# Patient Record
Sex: Male | Born: 1950 | Race: White | Hispanic: No | Smoking: Never smoker
Health system: Southern US, Community
[De-identification: ages and names within clinical notes are randomized; demographics above are authoritative.]

## PROBLEM LIST (undated history)

## (undated) DIAGNOSIS — M1990 Unspecified osteoarthritis, unspecified site: Secondary | ICD-10-CM

## (undated) DIAGNOSIS — R112 Nausea with vomiting, unspecified: Secondary | ICD-10-CM

## (undated) DIAGNOSIS — R198 Other specified symptoms and signs involving the digestive system and abdomen: Secondary | ICD-10-CM

## (undated) DIAGNOSIS — Z9889 Other specified postprocedural states: Secondary | ICD-10-CM

## (undated) DIAGNOSIS — R0600 Dyspnea, unspecified: Secondary | ICD-10-CM

## (undated) DIAGNOSIS — T8859XA Other complications of anesthesia, initial encounter: Secondary | ICD-10-CM

## (undated) DIAGNOSIS — D649 Anemia, unspecified: Secondary | ICD-10-CM

## (undated) DIAGNOSIS — C801 Malignant (primary) neoplasm, unspecified: Secondary | ICD-10-CM

## (undated) DIAGNOSIS — T4145XA Adverse effect of unspecified anesthetic, initial encounter: Secondary | ICD-10-CM

## (undated) HISTORY — PX: VASECTOMY: SHX75

## (undated) HISTORY — PX: DIAGNOSTIC LAPAROSCOPY: SUR761

## (undated) HISTORY — DX: Other specified symptoms and signs involving the digestive system and abdomen: R19.8

---

## 2015-11-27 DIAGNOSIS — Z2821 Immunization not carried out because of patient refusal: Secondary | ICD-10-CM | POA: Insufficient documentation

## 2015-12-14 DIAGNOSIS — G8929 Other chronic pain: Secondary | ICD-10-CM | POA: Insufficient documentation

## 2015-12-14 DIAGNOSIS — M25562 Pain in left knee: Secondary | ICD-10-CM

## 2017-06-24 ENCOUNTER — Ambulatory Visit: Payer: Self-pay | Admitting: Surgery

## 2017-06-25 ENCOUNTER — Encounter
Admission: RE | Admit: 2017-06-25 | Discharge: 2017-06-25 | Disposition: A | Discharge status: Home / Self Care | Payer: Medicare Other | Source: Ambulatory Visit | Attending: Surgery | Admitting: Surgery

## 2017-06-25 DIAGNOSIS — Z791 Long term (current) use of non-steroidal anti-inflammatories (NSAID): Secondary | ICD-10-CM | POA: Diagnosis not present

## 2017-06-25 DIAGNOSIS — M1990 Unspecified osteoarthritis, unspecified site: Secondary | ICD-10-CM | POA: Diagnosis not present

## 2017-06-25 DIAGNOSIS — C773 Secondary and unspecified malignant neoplasm of axilla and upper limb lymph nodes: Secondary | ICD-10-CM | POA: Diagnosis not present

## 2017-06-25 DIAGNOSIS — Z79899 Other long term (current) drug therapy: Secondary | ICD-10-CM | POA: Diagnosis not present

## 2017-06-25 DIAGNOSIS — R229 Localized swelling, mass and lump, unspecified: Secondary | ICD-10-CM | POA: Diagnosis present

## 2017-06-25 DIAGNOSIS — Z8249 Family history of ischemic heart disease and other diseases of the circulatory system: Secondary | ICD-10-CM | POA: Diagnosis not present

## 2017-06-25 DIAGNOSIS — Z833 Family history of diabetes mellitus: Secondary | ICD-10-CM | POA: Diagnosis not present

## 2017-06-25 DIAGNOSIS — Z9852 Vasectomy status: Secondary | ICD-10-CM | POA: Diagnosis not present

## 2017-06-25 HISTORY — DX: Unspecified osteoarthritis, unspecified site: M19.90

## 2017-06-25 HISTORY — DX: Anemia, unspecified: D64.9

## 2017-06-25 MED ORDER — CEFAZOLIN SODIUM-DEXTROSE 2-4 GM/100ML-% IV SOLN
2.0000 g | INTRAVENOUS | Status: AC
  Administered 2017-06-26: 2 g via INTRAVENOUS

## 2017-06-25 NOTE — Pre-Procedure Instructions (Signed)
Component Name Value Ref Range  EKG Systolic BP  mmHg  EKG Diastolic BP  mmHg  EKG Ventricular Rate 56 BPM  EKG Atrial Rate 56 BPM  EKG P-R Interval 154 ms  EKG QRS Duration 90 ms  EKG Q-T Interval 426 ms  EKG QTC Calculation 411 ms  EKG Calculated P Axis 42 degrees  EKG Calculated R Axis  degrees  EKG Calculated T Axis 23 degrees  Result Narrative  SINUS BRADYCARDIA NO PREVIOUS ECGS AVAILABLE Confirmed by Dimas Alexandria [phone]) on 04/27/2017 4:55:42 PM  Other Result Information  Interface, Rad Results In - 04/27/2017  4:55 PM EDT SINUS BRADYCARDIA NO PREVIOUS ECGS AVAILABLE Confirmed by Dimas Alexandria [phone]) on 04/27/2017 4:55:42 PM

## 2017-06-25 NOTE — Patient Instructions (Signed)
Your procedure is scheduled on: June 26, 2017 (FRIDAY ) Report to Same Day Surgery 2nd floor medical mall ([facility] Entrance-take elevator on left to 2nd floor.  Check in with surgery information desk.) To find out your arrival time please call [phone] between 1PM - 3PM on June 25, 2017 (THURSDAY )  Remember: Instructions that are not followed completely may result in serious medical risk, up to and including death, or upon the discretion of your surgeon and anesthesiologist your surgery may need to be rescheduled.    _x___ 1. Do not eat food after midnight the night before your procedure. You may drink clear liquids up to 2 hours before you are scheduled to arrive at the hospital for your procedure.  Do not drink clear liquids within 2 hours of your scheduled arrival to the hospital.  Clear liquids include  --Water or Apple juice without pulp  --Clear carbohydrate beverage such as ClearFast or Gatorade  --Black Coffee or Clear Tea (No milk, no creamers, do not add anything to                  the coffee or Tea Type 1 and type 2 diabetics should only drink water.  No gum chewing or hard candies.     __x__ 2. No Alcohol for 24 hours before or after surgery.   __x__3. No Smoking for 24 prior to surgery.   ____  4. Bring all medications with you on the day of surgery if instructed.    __x__ 5. Notify your doctor if there is any change in your medical condition     (cold, fever, infections).     Do not wear jewelry, make-up, hairpins, clips or nail polish.  Do not wear lotions, powders, or perfumes.  Do not shave 48 hours prior to surgery. Men may shave face and neck.  Do not bring valuables to the hospital.    [facility] is not responsible for any belongings or valuables.               Contacts, dentures or bridgework may not be worn into surgery.  Leave your suitcase in the car. After surgery it may be brought to your room.  For patients admitted to the hospital,  discharge time is determined by your treatment team                   Patients discharged the day of surgery will not be allowed to drive home.  You will need someone to drive you home and stay with you the night of your procedure.    Please read over the following fact sheets that you were given:   [facility] Preparing for Surgery and or MRSA Information   ____ Take anti-hypertensive listed below, cardiac, seizure, asthma,     anti-reflux and psychiatric medicines. These include:  1.   2.  3.  4.  5.  6.  ____Fleets enema or Magnesium Citrate as directed.   _x___ Use CHG Soap or sage wipes as directed on instruction sheet   ____ Use inhalers on the day of surgery and bring to hospital day of surgery  ____ Stop Metformin and Janumet 2 days prior to surgery.    ____ Take 1/2 of usual insulin dose the night before surgery and none on the morning surgery.     _x___ Follow recommendations from Cardiologist, Pulmonologist or PCP regarding          stopping Aspirin, Coumadin, Plavix ,  Eliquis, Effient, or Pradaxa, and Pletal.  X____Stop Anti-inflammatories such as Advil, Aleve, Ibuprofen, Motrin, Naproxen, Naprosyn, Goodies powders or aspirin products. OK to take Tylenol  _x___ Stop supplements until after surgery.  But may continue Vitamin D, Vitamin B,and multivitamin          ____ Bring C-Pap to the hospital.

## 2017-06-26 ENCOUNTER — Ambulatory Visit: Payer: Medicare Other | Admitting: Anesthesiology

## 2017-06-26 ENCOUNTER — Ambulatory Visit
Admission: RE | Admit: 2017-06-26 | Discharge: 2017-06-26 | Disposition: A | Discharge status: Home / Self Care | Payer: Medicare Other | Source: Ambulatory Visit | Attending: Surgery | Admitting: Surgery

## 2017-06-26 ENCOUNTER — Encounter
Admission: RE | Disposition: A | Discharge status: Home / Self Care | Payer: Self-pay | Source: Ambulatory Visit | Attending: Surgery

## 2017-06-26 DIAGNOSIS — Z833 Family history of diabetes mellitus: Secondary | ICD-10-CM | POA: Insufficient documentation

## 2017-06-26 DIAGNOSIS — Z8249 Family history of ischemic heart disease and other diseases of the circulatory system: Secondary | ICD-10-CM | POA: Insufficient documentation

## 2017-06-26 DIAGNOSIS — Z9852 Vasectomy status: Secondary | ICD-10-CM | POA: Insufficient documentation

## 2017-06-26 DIAGNOSIS — C773 Secondary and unspecified malignant neoplasm of axilla and upper limb lymph nodes: Secondary | ICD-10-CM | POA: Insufficient documentation

## 2017-06-26 DIAGNOSIS — M1990 Unspecified osteoarthritis, unspecified site: Secondary | ICD-10-CM | POA: Insufficient documentation

## 2017-06-26 DIAGNOSIS — Z79899 Other long term (current) drug therapy: Secondary | ICD-10-CM | POA: Insufficient documentation

## 2017-06-26 DIAGNOSIS — Z791 Long term (current) use of non-steroidal anti-inflammatories (NSAID): Secondary | ICD-10-CM | POA: Insufficient documentation

## 2017-06-26 DIAGNOSIS — R2232 Localized swelling, mass and lump, left upper limb: Secondary | ICD-10-CM

## 2017-06-26 HISTORY — DX: Other specified postprocedural states: Z98.890

## 2017-06-26 HISTORY — DX: Adverse effect of unspecified anesthetic, initial encounter: T41.45XA

## 2017-06-26 HISTORY — PX: AXILLARY LYMPH NODE DISSECTION: SHX5229

## 2017-06-26 HISTORY — DX: Other complications of anesthesia, initial encounter: T88.59XA

## 2017-06-26 HISTORY — DX: Nausea with vomiting, unspecified: R11.2

## 2017-06-26 SURGERY — LYMPHADENECTOMY, AXILLARY
Anesthesia: General | Laterality: Left | Wound class: Clean

## 2017-06-26 MED ORDER — ACETAMINOPHEN 650 MG RE SUPP
650.0000 mg | RECTAL | Status: DC | PRN
  Filled 2017-06-26: qty 1

## 2017-06-26 MED ORDER — DEXTROSE IN LACTATED RINGERS 5 % IV SOLN: INTRAVENOUS | Status: DC

## 2017-06-26 MED ORDER — PROPOFOL 10 MG/ML IV BOLUS
INTRAVENOUS | Status: AC
  Filled 2017-06-26: qty 20

## 2017-06-26 MED ORDER — GLYCOPYRROLATE 0.2 MG/ML IJ SOLN
INTRAMUSCULAR | Status: DC | PRN
  Administered 2017-06-26: 0.2 mg via INTRAVENOUS

## 2017-06-26 MED ORDER — FAMOTIDINE 20 MG PO TABS
20.0000 mg | ORAL_TABLET | Freq: Once | ORAL | Status: AC
  Administered 2017-06-26: 20 mg via ORAL

## 2017-06-26 MED ORDER — FENTANYL CITRATE (PF) 100 MCG/2ML IJ SOLN: 25.0000 ug | INTRAMUSCULAR | Status: DC | PRN

## 2017-06-26 MED ORDER — SODIUM CHLORIDE 0.9 % IV SOLN: 250.0000 mL | INTRAVENOUS | Status: DC | PRN

## 2017-06-26 MED ORDER — MIDAZOLAM HCL 2 MG/2ML IJ SOLN
INTRAMUSCULAR | Status: DC | PRN
Start: 2017-06-26 — End: 2017-06-26
  Administered 2017-06-26: 2 mg via INTRAVENOUS

## 2017-06-26 MED ORDER — CELECOXIB 200 MG PO CAPS
ORAL_CAPSULE | ORAL | Status: AC
  Filled 2017-06-26: qty 2

## 2017-06-26 MED ORDER — EPHEDRINE SULFATE 50 MG/ML IJ SOLN
INTRAMUSCULAR | Status: AC
  Filled 2017-06-26: qty 1

## 2017-06-26 MED ORDER — CELECOXIB 200 MG PO CAPS
400.0000 mg | ORAL_CAPSULE | ORAL | Status: AC
  Administered 2017-06-26: 400 mg via ORAL

## 2017-06-26 MED ORDER — LACTATED RINGERS IV SOLN
INTRAVENOUS | Status: DC
  Administered 2017-06-26 (×2): via INTRAVENOUS

## 2017-06-26 MED ORDER — HEPARIN SODIUM (PORCINE) 5000 UNIT/ML IJ SOLN
5000.0000 [IU] | Freq: Once | INTRAMUSCULAR | Status: AC
  Administered 2017-06-26: 5000 [IU] via SUBCUTANEOUS

## 2017-06-26 MED ORDER — ONDANSETRON HCL 4 MG/2ML IJ SOLN
INTRAMUSCULAR | Status: AC
  Filled 2017-06-26: qty 2

## 2017-06-26 MED ORDER — PROPOFOL 10 MG/ML IV BOLUS
INTRAVENOUS | Status: DC | PRN
  Administered 2017-06-26: 150 mg via INTRAVENOUS

## 2017-06-26 MED ORDER — GABAPENTIN 300 MG PO CAPS
ORAL_CAPSULE | ORAL | Status: AC
  Filled 2017-06-26: qty 1

## 2017-06-26 MED ORDER — FAMOTIDINE 20 MG PO TABS
ORAL_TABLET | ORAL | Status: AC
  Filled 2017-06-26: qty 1

## 2017-06-26 MED ORDER — CHLORHEXIDINE GLUCONATE CLOTH 2 % EX PADS
6.0000 | MEDICATED_PAD | Freq: Once | CUTANEOUS | Status: AC
  Administered 2017-06-26: 6 via TOPICAL

## 2017-06-26 MED ORDER — MORPHINE SULFATE (PF) 4 MG/ML IV SOLN: 1.0000 mg | INTRAVENOUS | Status: DC | PRN

## 2017-06-26 MED ORDER — LIDOCAINE HCL (PF) 2 % IJ SOLN
INTRAMUSCULAR | Status: AC
  Filled 2017-06-26: qty 4

## 2017-06-26 MED ORDER — SODIUM CHLORIDE 0.9% FLUSH
3.0000 mL | INTRAVENOUS | Status: DC | PRN
Start: 2017-06-26 — End: 2017-06-26

## 2017-06-26 MED ORDER — DEXAMETHASONE SODIUM PHOSPHATE 10 MG/ML IJ SOLN
INTRAMUSCULAR | Status: AC
  Filled 2017-06-26: qty 1

## 2017-06-26 MED ORDER — FENTANYL CITRATE (PF) 100 MCG/2ML IJ SOLN
INTRAMUSCULAR | Status: DC | PRN
  Administered 2017-06-26 (×2): 50 ug via INTRAVENOUS

## 2017-06-26 MED ORDER — LIDOCAINE HCL (CARDIAC) 20 MG/ML IV SOLN
INTRAVENOUS | Status: DC | PRN
  Administered 2017-06-26: 50 mg via INTRAVENOUS

## 2017-06-26 MED ORDER — FENTANYL CITRATE (PF) 100 MCG/2ML IJ SOLN
INTRAMUSCULAR | Status: AC
  Filled 2017-06-26: qty 2

## 2017-06-26 MED ORDER — ONDANSETRON 4 MG PO TBDP
4.0000 mg | ORAL_TABLET | Freq: Once | ORAL | Status: AC
  Administered 2017-06-26: 4 mg via ORAL
  Filled 2017-06-26: qty 1

## 2017-06-26 MED ORDER — MIDAZOLAM HCL 2 MG/2ML IJ SOLN
INTRAMUSCULAR | Status: AC
  Filled 2017-06-26: qty 2

## 2017-06-26 MED ORDER — BUPIVACAINE-EPINEPHRINE (PF) 0.5% -1:200000 IJ SOLN
INTRAMUSCULAR | Status: AC
  Filled 2017-06-26: qty 30

## 2017-06-26 MED ORDER — EPHEDRINE SULFATE 50 MG/ML IJ SOLN
INTRAMUSCULAR | Status: DC | PRN
  Administered 2017-06-26: 10 mg via INTRAVENOUS

## 2017-06-26 MED ORDER — HEPARIN SODIUM (PORCINE) 5000 UNIT/ML IJ SOLN
INTRAMUSCULAR | Status: AC
  Filled 2017-06-26: qty 1

## 2017-06-26 MED ORDER — ONDANSETRON HCL 4 MG/2ML IJ SOLN: 4.0000 mg | Freq: Once | INTRAMUSCULAR | Status: DC | PRN

## 2017-06-26 MED ORDER — GLYCOPYRROLATE 0.2 MG/ML IJ SOLN
INTRAMUSCULAR | Status: AC
  Filled 2017-06-26: qty 1

## 2017-06-26 MED ORDER — CEFAZOLIN SODIUM-DEXTROSE 2-4 GM/100ML-% IV SOLN
INTRAVENOUS | Status: AC
  Filled 2017-06-26: qty 100

## 2017-06-26 MED ORDER — ACETAMINOPHEN 500 MG PO TABS
ORAL_TABLET | ORAL | Status: AC
  Filled 2017-06-26: qty 2

## 2017-06-26 MED ORDER — CHLORHEXIDINE GLUCONATE CLOTH 2 % EX PADS: 6.0000 | MEDICATED_PAD | Freq: Once | CUTANEOUS | Status: DC

## 2017-06-26 MED ORDER — OXYCODONE HCL 5 MG PO TABS: 5.0000 mg | ORAL_TABLET | ORAL | Status: DC | PRN

## 2017-06-26 MED ORDER — SODIUM CHLORIDE 0.9% FLUSH: 3.0000 mL | Freq: Two times a day (BID) | INTRAVENOUS | Status: DC

## 2017-06-26 MED ORDER — HYDROCODONE-ACETAMINOPHEN 5-325 MG PO TABS: 1.0000 | ORAL_TABLET | ORAL | 0 refills | Status: DC | PRN

## 2017-06-26 MED ORDER — DEXAMETHASONE SODIUM PHOSPHATE 10 MG/ML IJ SOLN
INTRAMUSCULAR | Status: DC | PRN
  Administered 2017-06-26: 10 mg via INTRAVENOUS

## 2017-06-26 MED ORDER — ACETAMINOPHEN 325 MG PO TABS: 650.0000 mg | ORAL_TABLET | ORAL | Status: DC | PRN

## 2017-06-26 MED ORDER — ONDANSETRON HCL 4 MG/2ML IJ SOLN
INTRAMUSCULAR | Status: DC | PRN
  Administered 2017-06-26: 4 mg via INTRAVENOUS

## 2017-06-26 MED ORDER — ACETAMINOPHEN 500 MG PO TABS
1000.0000 mg | ORAL_TABLET | ORAL | Status: AC
  Administered 2017-06-26: 1000 mg via ORAL

## 2017-06-26 MED ORDER — GABAPENTIN 300 MG PO CAPS
300.0000 mg | ORAL_CAPSULE | ORAL | Status: AC
  Administered 2017-06-26: 300 mg via ORAL

## 2017-06-26 SURGICAL SUPPLY — 17 items
APPLIER CLIP 9.375 SM OPEN (CLIP) ×3
CANISTER SUCT 1200ML W/VALVE (MISCELLANEOUS) ×3 IMPLANT
CLIP APPLIE 9.375 SM OPEN (CLIP) ×1 IMPLANT
DRAPE PED LAPAROTOMY (DRAPES) ×3 IMPLANT
ELECT REM PT RETURN 9FT ADLT (ELECTROSURGICAL) ×3
ELECTRODE REM PT RTRN 9FT ADLT (ELECTROSURGICAL) ×1 IMPLANT
EVICEL 2ML SEALANT HUMAN (Miscellaneous) ×3 IMPLANT
EVICEL AIRLESS SPRAY ACCES (MISCELLANEOUS) ×3 IMPLANT
GLOVE BIO SURGEON STRL SZ8 (GLOVE) ×3 IMPLANT
GOWN STRL REUS W/ TWL LRG LVL3 (GOWN DISPOSABLE) ×2 IMPLANT
GOWN STRL REUS W/TWL LRG LVL3 (GOWN DISPOSABLE) ×4
HARMONIC SCALPEL FOCUS (MISCELLANEOUS) ×3 IMPLANT
HEMOSTAT ARISTA ABSORB 3G PWDR (MISCELLANEOUS) ×3 IMPLANT
KIT RM TURNOVER STRD PROC AR (KITS) ×3 IMPLANT
NEEDLE HYPO 25GX1X1/2 BEV (NEEDLE) ×3 IMPLANT
SPONGE XRAY 4X4 16PLY STRL (MISCELLANEOUS) ×3 IMPLANT
SYR BULB IRRIG 60ML STRL (SYRINGE) ×3 IMPLANT

## 2017-06-26 NOTE — Anesthesia Post-op Follow-up Note (Signed)
Anesthesia QCDR form completed.        

## 2017-06-26 NOTE — Anesthesia Postprocedure Evaluation (Signed)
Anesthesia Post Note  Patient: Louis Davies  Procedure(s) Performed: Procedure(s) (LRB): EXCISION OF LEFT AXILLARY MASS (Left)  Patient location during evaluation: PACU Anesthesia Type: General Level of consciousness: awake and alert Pain management: pain level controlled Vital Signs Assessment: post-procedure vital signs reviewed and stable Respiratory status: spontaneous breathing, nonlabored ventilation, respiratory function stable and patient connected to nasal cannula oxygen Cardiovascular status: blood pressure returned to baseline and stable Postop Assessment: no signs of nausea or vomiting Anesthetic complications: no     Last Vitals:  Vitals:   06/26/17 1231 06/26/17 1304  BP: 134/82 133/82  Pulse: 71 68  Resp: 16 16  Temp: (!) 36 C   SpO2: 98% 96%    Last Pain:  Vitals:   06/26/17 1304  TempSrc:   PainSc: 0-No pain                 Cherryl Babin S

## 2017-06-26 NOTE — OR Nursing (Signed)
Pt had dressed was on his way to the car via wheelchair when he started dry heaving severely.  Volunteer Becky brought pt back to room 4.  Called Dr. Marcello Moores he ordered Zofran 4 mg disintegrating tablet, not to start an IV at this time.

## 2017-06-26 NOTE — Op Note (Signed)
Surgeon: Kaylyn Lim, MD, FACS  Asst:  none  Anes:  General by LMA  Preop Dx: Mass in left axilla-adenopathy suspect lymphoma Postop Dx: Large adenopathy-incisional biopsy of node with touch preps, frozen and lymphoma workup (adequate tissue for sample-per path)  Mitiotic figures likely lymphoma, spindle cell neoplasm or melanoma-cell markers pending  Procedure: Open left axillary biopsy Location Surgery: [facility] #4 Complications: None noted  EBL:   400 cc  Drains: none  Description of Procedure:  The patient was taken to OR 4 .  After anesthesia was administered and the patient was prepped a timeout was performed.  The left axilla was clipped and prepped with Hibiclens.  A transverse incison was made and carried down through the fatty tissue down to a large nodal mass.  Individual nodes were not discerned.  With the harmonic scalpel incision into the nodal mass and excision of capsule and the vascular stroma that was amorphic and very friable.  Bovie and Harmonic used on bleeding with subsequent application of EVICEL and Arista which seemed to leave the nodal bed dry.   Wound closed in layers with 4-0 vicryl subcutaneously and subcuticularly and with skin staples.    The patient tolerated the procedure well and was taken to the PACU in stable condition.     Matt B. Hassell Done, [place], [facility], [place]

## 2017-06-26 NOTE — Interval H&P Note (Signed)
History and Physical Interval Note:  06/26/2017 9:06 AM  Louis Davies  has presented today for surgery, with the diagnosis of LEFT AXILLARY MASS  The various methods of treatment have been discussed with the patient and family. After consideration of risks, benefits and other options for treatment, the patient has consented to  Procedure(s): EXCISION OF LEFT AXILLARY MASS (Left) as a surgical intervention .  The patient's history has been reviewed, patient examined, no change in status, stable for surgery.  I have reviewed the patient's chart and labs.  Questions were answered to the patient's satisfaction.     Terrie Haring B

## 2017-06-26 NOTE — Anesthesia Procedure Notes (Signed)
Procedure Name: LMA Insertion Performed by: Silvana Newness Pre-anesthesia Checklist: Patient identified, Patient being monitored, Timeout performed, Emergency Drugs available and Suction available Patient Re-evaluated:Patient Re-evaluated prior to induction Oxygen Delivery Method: Circle system utilized Preoxygenation: Pre-oxygenation with 100% oxygen Induction Type: IV induction Ventilation: Mask ventilation without difficulty LMA: LMA inserted LMA Size: 4.0 Tube type: Oral Number of attempts: 1 Placement Confirmation: positive ETCO2 and breath sounds checked- equal and bilateral Tube secured with: Tape Dental Injury: Teeth and Oropharynx as per pre-operative assessment

## 2017-06-26 NOTE — Transfer of Care (Signed)
Immediate Anesthesia Transfer of Care Note  Patient: Louis Davies  Procedure(s) Performed: Procedure(s): EXCISION OF LEFT AXILLARY MASS (Left)  Patient Location: PACU  Anesthesia Type:General  Level of Consciousness: drowsy and patient cooperative  Airway & Oxygen Therapy: Patient Spontanous Breathing and Patient connected to face mask oxygen  Post-op Assessment: Report given to RN and Post -op Vital signs reviewed and stable  Post vital signs: Reviewed and stable  Last Vitals:  Vitals:   06/26/17 0815  BP: 139/85  Pulse: (!) 58  Resp: 17  Temp: (!) 36.1 C  SpO2: 97%    Last Pain:  Vitals:   06/26/17 0815  TempSrc: Tympanic         Complications: No apparent anesthesia complications

## 2017-06-26 NOTE — OR Nursing (Signed)
At 1500 spoke with pt, reports he still feels nauseated.  Will let him sleep for another 30 minutes and re-evaluate.

## 2017-06-26 NOTE — OR Nursing (Signed)
At 71 35, pt is awake, reports need to void, transferred pt to wheelchair and transfered to bathroom.  Pt stool to void qs. Placed back into wheel chair to transferred to room 4.  Upon arrival to room 4 pt started dry heaving again.  Pt's friend talked with pt, he wants to go home, she is concerned about him drying on way home.  Spoke with Dr. Teryl Lucy regarding pt having dry heaves episodes, being given anti nausea meds, preop, in OR and post op.  Pt does have Zofran at home for nausea. If pt wants to go home he can, but Dr. Rosey Bath said pt is welcome to stay for further treatment, possible admission.  Pt wants to go home.  Taken to car in wheelchair.

## 2017-06-26 NOTE — Discharge Instructions (Signed)
AMBULATORY SURGERY  DISCHARGE INSTRUCTIONS   1) The drugs that you were given will stay in your system until tomorrow so for the next 24 hours you should not:  A) Drive an automobile B) Make any legal decisions C) Drink any alcoholic beverage   2) You may resume regular meals tomorrow.  Today it is better to start with liquids and gradually work up to solid foods.  You may eat anything you prefer, but it is better to start with liquids, then soup and crackers, and gradually work up to solid foods.   3) Please notify your doctor immediately if you have any unusual bleeding, trouble breathing, redness and pain at the surgery site, drainage, fever, or pain not relieved by medication.   4) Additional Instructions:Keep dressing clean and dry.

## 2017-06-26 NOTE — Anesthesia Preprocedure Evaluation (Signed)
Anesthesia Evaluation  Patient identified by MRN, date of birth, ID band Patient awake    Reviewed: Allergy & Precautions, NPO status , Patient's Chart, lab work & pertinent test results, reviewed documented beta blocker date and time   History of Anesthesia Complications (+) PONV  Airway Mallampati: II  TM Distance: >3 FB     Dental  (+) Chipped   Pulmonary           Cardiovascular      Neuro/Psych    GI/Hepatic   Endo/Other    Renal/GU      Musculoskeletal  (+) Arthritis ,   Abdominal   Peds  Hematology  (+) anemia ,   Anesthesia Other Findings   Reproductive/Obstetrics                             Anesthesia Physical Anesthesia Plan  ASA: II  Anesthesia Plan: General   Post-op Pain Management:    Induction: Intravenous  PONV Risk Score and Plan:   Airway Management Planned: LMA  Additional Equipment:   Intra-op Plan:   Post-operative Plan:   Informed Consent: I have reviewed the patients History and Physical, chart, labs and discussed the procedure including the risks, benefits and alternatives for the proposed anesthesia with the patient or authorized representative who has indicated his/her understanding and acceptance.     Plan Discussed with: CRNA  Anesthesia Plan Comments:         Anesthesia Quick Evaluation

## 2017-06-26 NOTE — H&P (Signed)
Chief Complaint:  Mass in left axilla  History of Present Illness:  Louis Davies is an 66 y.o. male who was seen last Thursday with a nontender mass in his left axilla.  He denies fever chills or night sweats.  Left axillary lymph node biopsy was discussed with him including indications and risks.  Past Medical History:  Diagnosis Date  . Anemia   . Arthritis   . Complication of anesthesia   . PONV (postoperative nausea and vomiting)     Past Surgical History:  Procedure Laterality Date  . VASECTOMY      Current Facility-Administered Medications  Medication Dose Route Frequency Provider Last Rate Last Dose  . acetaminophen (TYLENOL) 500 MG tablet           . ceFAZolin (ANCEF) 2-4 GM/100ML-% IVPB           . ceFAZolin (ANCEF) IVPB 2g/100 mL premix  2 g Intravenous On Call to OR Johnathan Hausen, MD      . celecoxib (CELEBREX) 200 MG capsule           . Chlorhexidine Gluconate Cloth 2 % PADS 6 each  6 each Topical Once Johnathan Hausen, MD      . famotidine (PEPCID) 20 MG tablet           . gabapentin (NEURONTIN) 300 MG capsule           . heparin 5000 UNIT/ML injection           . lactated ringers infusion   Intravenous Continuous Molli Barrows, MD 25 mL/hr at 06/26/17 [id number]     Patient has no known allergies. Family History  Problem Relation Age of Onset  . Diabetes Mother   . Hypertension Mother    Social History:   reports that he has never smoked. He has never used smokeless tobacco. He reports that he does not drink alcohol or use drugs.   REVIEW OF SYSTEMS : Negative except for see problem list  Physical Exam:   Blood pressure 139/85, pulse (!) 58, temperature (!) 97 F (36.1 C), temperature source Tympanic, resp. rate 17, height 5\' 11"  (1.803 m), weight 93 kg (205 lb), SpO2 97 %. Body mass index is 28.59 kg/m.  Gen:  WDWN WM NAD  Neurological: Alert and oriented to person, place, and time. Motor and sensory function is grossly intact  Head: Normocephalic and  atraumatic.  Eyes: Conjunctivae are normal. Pupils are equal, round, and reactive to light. No scleral icterus.  Neck: Normal range of motion. Neck supple. No tracheal deviation or thyromegaly present.  Cardiovascular:  SR without murmurs or gallops.  No carotid bruits Breast:  No palpable masses Respiratory: Effort normal.  No respiratory distress. No chest wall tenderness. Breath sounds normal.  No wheezes, rales or rhonchi.  Abdomen:  nontender GU:  No lymphadenopathy Musculoskeletal: Normal range of motion. Extremities are nontender. No cyanosis, edema or clubbing noted Lymphadenopathy: Large left axillary mass that is nontender Skin: Skin is warm and dry. No rash noted. No diaphoresis. No erythema. No pallor. Pscyh: Normal mood and affect. Behavior is normal. Judgment and thought content normal.   LABORATORY RESULTS: No results found for this or any previous visit (from the past 48 hour(s)).   RADIOLOGY RESULTS: No results found.  Problem List: There are no active problems to display for this patient.   Assessment & Plan: Left axillary lymphadenopathy for left axillary lymph node biopsy    Matt B. Hassell Done, MD, [facility]  [facility], [place]. [phone] beeper [phone]  06/26/2017 9:03 AM

## 2017-06-29 ENCOUNTER — Encounter: Payer: Self-pay | Admitting: Surgery

## 2017-06-30 ENCOUNTER — Other Ambulatory Visit: Payer: Self-pay | Admitting: Pathology

## 2017-07-06 ENCOUNTER — Telehealth: Payer: Self-pay

## 2017-07-06 NOTE — Telephone Encounter (Signed)
  Oncology Nurse Navigator Documentation Called and spoke with triage nurse at [facility] and they are going to refax referral and last physician note from Dr. Hassell Done. Provided them with appointment details for this week. Navigator Location: [facility] (07/06/17 1400)   )Navigator Encounter Type: Telephone (07/06/17 1400)                                                    Time Spent with Patient: 15 (07/06/17 1400)

## 2017-07-07 DIAGNOSIS — C799 Secondary malignant neoplasm of unspecified site: Secondary | ICD-10-CM | POA: Insufficient documentation

## 2017-07-07 DIAGNOSIS — C439 Malignant melanoma of skin, unspecified: Secondary | ICD-10-CM | POA: Insufficient documentation

## 2017-07-07 NOTE — Progress Notes (Signed)
[place]  [phone] [phone]  ID: MERTON WADLOW OB: 1951-05-03  MR#: [id number]  BEE#:[id number]  Patient Care Team: Care, [facility] as PCP - General (Family Medicine)  CHIEF COMPLAINT: Melanoma  INTERVAL HISTORY: Patient is a 66 year old male who noticed an enlarging nodule in his left axilla over the past several months. Subsequent excisional biopsy revealed metastatic melanoma. Patient had a lesion removed from his left back back in 2016 but otherwise has noted no other problems. Currently he is anxious, but otherwise feels well. He has no neurologic complaints. He denies any recent fevers or illnesses. He has a good appetite and denies weight loss. He has no chest pain or shortness of breath. He denies any nausea, vomiting, constipation, or diarrhea. He has no melena or hematochezia. He has no urinary complaints. Patient otherwise feels well and offers no further specific complaints.  REVIEW OF SYSTEMS:   Review of Systems  Constitutional: Negative.  Negative for fever, malaise/fatigue and weight loss.  Respiratory: Negative.  Negative for cough and shortness of breath.   Cardiovascular: Negative.  Negative for chest pain and leg swelling.  Gastrointestinal: Negative.  Negative for abdominal pain.  Genitourinary: Negative.   Musculoskeletal: Negative.   Skin: Negative.  Negative for rash.  Neurological: Negative.  Negative for sensory change and weakness.  Psychiatric/Behavioral: The patient is nervous/anxious.     As per HPI. Otherwise, a complete review of systems is negative.  PAST MEDICAL HISTORY: Past Medical History:  Diagnosis Date  . Anemia   . Arthritis   . Complication of anesthesia   . PONV (postoperative nausea and vomiting)     PAST SURGICAL HISTORY: Past Surgical History:  Procedure Laterality Date  . AXILLARY LYMPH NODE DISSECTION Left 06/26/2017   Procedure: EXCISION OF LEFT AXILLARY MASS;  Surgeon:  Johnathan Hausen, MD;  Location: [facility] ORS;  Service: General;  Laterality: Left;  Marland Kitchen VASECTOMY      FAMILY HISTORY: Family History  Problem Relation Age of Onset  . Diabetes Mother   . Hypertension Mother   . Leukemia Father   . Thyroid disease Sister   . Liver disease Maternal Grandmother   . Heart Problems Maternal Grandfather   . Diabetes Maternal Grandfather   . Lung cancer Paternal Grandmother   . Prostate cancer Paternal Grandfather     ADVANCED DIRECTIVES (Y/N):  N  HEALTH MAINTENANCE: Social History  Substance Use Topics  . Smoking status: Never Smoker  . Smokeless tobacco: Never Used  . Alcohol use No     Colonoscopy:  PAP:  Bone density:  Lipid panel:  No Known Allergies  Current Outpatient Prescriptions  Medication Sig Dispense Refill  . cephALEXin (KEFLEX) 500 MG capsule TK ONE C PO QID X 5 DAYS.  0  . Multiple Vitamin (MULTIVITAMIN) tablet Take 1 tablet by mouth daily.     No current facility-administered medications for this visit.     OBJECTIVE: Vitals:   07/09/17 1149  BP: (!) 161/78  Pulse: 62  Resp: 18  Temp: (!) 97.4 F (36.3 C)     Body mass index is 28.76 kg/m.    ECOG FS:0 - Asymptomatic  General: Well-developed, well-nourished, no acute distress. Eyes: Pink conjunctiva, anicteric sclera. HEENT: Normocephalic, moist mucous membranes, clear oropharnyx. Lungs: Clear to auscultation bilaterally. Heart: Regular rate and rhythm. No rubs, murmurs, or gallops. Abdomen: Soft, nontender, nondistended. No organomegaly noted, normoactive bowel sounds. Musculoskeletal: No edema, cyanosis, or clubbing. Neuro: Alert, answering all questions appropriately. Cranial  nerves grossly intact. Skin: No rashes or petechiae noted. Psych: Normal affect. Lymphatics: No cervical, calvicular, axillary or inguinal LAD.   LAB RESULTS:  Lab Results  Component Value Date   NA 138 07/09/2017   K 4.3 07/09/2017   CL 104 07/09/2017   CO2 27 07/09/2017    GLUCOSE 95 07/09/2017   BUN 16 07/09/2017   CREATININE 1.09 07/09/2017   CALCIUM 8.6 (L) 07/09/2017   PROT 7.1 07/09/2017   ALBUMIN 3.9 07/09/2017   AST 16 07/09/2017   ALT 12 (L) 07/09/2017   ALKPHOS 61 07/09/2017   BILITOT 0.6 07/09/2017   GFRNONAA >60 07/09/2017   GFRAA >60 07/09/2017    Lab Results  Component Value Date   WBC 6.6 07/09/2017   NEUTROABS 4.9 07/09/2017   HGB 11.8 (L) 07/09/2017   HCT 34.8 (L) 07/09/2017   MCV 80.5 07/09/2017   PLT 283 07/09/2017     STUDIES: No results found.  ASSESSMENT: Melanoma  PLAN:    1. Melanoma: Pathology results reviewed independently confirming metastatic melanoma in his left axilla. Possible primary may be lesion he had removed from his left back in 2016, but this is unclear. We will get a PET scan in the next week for initial staging purposes. Will also send pathology for the BRAF mutation. Patient will return to clinic in 1 week to discuss his imaging results and treatment planning if necessary.  Approximately 60 minutes was spent in discussion of which greater than 50% was consultation.  Patient expressed understanding and was in agreement with this plan. He also understands that He can call clinic at any time with any questions, concerns, or complaints.   Cancer Staging No matching staging information was found for the patient.  Lloyd Huger, MD   07/15/2017 9:45 AM

## 2017-07-09 ENCOUNTER — Encounter: Payer: Self-pay | Admitting: Oncology

## 2017-07-09 ENCOUNTER — Inpatient Hospital Stay: Payer: Medicare Other | Attending: Oncology | Admitting: Oncology

## 2017-07-09 ENCOUNTER — Inpatient Hospital Stay: Payer: Medicare Other

## 2017-07-09 DIAGNOSIS — D649 Anemia, unspecified: Secondary | ICD-10-CM

## 2017-07-09 DIAGNOSIS — Z79899 Other long term (current) drug therapy: Secondary | ICD-10-CM

## 2017-07-09 DIAGNOSIS — C792 Secondary malignant neoplasm of skin: Secondary | ICD-10-CM

## 2017-07-09 DIAGNOSIS — C439 Malignant melanoma of skin, unspecified: Secondary | ICD-10-CM | POA: Diagnosis not present

## 2017-07-09 DIAGNOSIS — Z8042 Family history of malignant neoplasm of prostate: Secondary | ICD-10-CM | POA: Diagnosis not present

## 2017-07-09 DIAGNOSIS — M129 Arthropathy, unspecified: Secondary | ICD-10-CM | POA: Diagnosis not present

## 2017-07-09 DIAGNOSIS — Z801 Family history of malignant neoplasm of trachea, bronchus and lung: Secondary | ICD-10-CM | POA: Diagnosis not present

## 2017-07-09 LAB — COMPREHENSIVE METABOLIC PANEL
ALK PHOS: 61 U/L (ref 38–126)
ALT: 12 U/L — AB (ref 17–63)
AST: 16 U/L (ref 15–41)
Albumin: 3.9 g/dL (ref 3.5–5.0)
Anion gap: 7 (ref 5–15)
BILIRUBIN TOTAL: 0.6 mg/dL (ref 0.3–1.2)
BUN: 16 mg/dL (ref 6–20)
CALCIUM: 8.6 mg/dL — AB (ref 8.9–10.3)
CO2: 27 mmol/L (ref 22–32)
CREATININE: 1.09 mg/dL (ref 0.61–1.24)
Chloride: 104 mmol/L (ref 101–111)
GFR calc non Af Amer: >60 mL/min (ref >60)
Glucose, Bld: 95 mg/dL (ref 65–99)
Potassium: 4.3 mmol/L (ref 3.5–5.1)
SODIUM: 138 mmol/L (ref 135–145)
TOTAL PROTEIN: 7.1 g/dL (ref 6.5–8.1)

## 2017-07-09 LAB — CBC WITH DIFFERENTIAL/PLATELET
Basophils Absolute: 0 10*3/uL (ref 0–0.1)
Basophils Relative: 1 %
EOS ABS: 0.1 10*3/uL (ref 0–0.7)
Eosinophils Relative: 1 %
HEMATOCRIT: 34.8 % — AB (ref 40.0–52.0)
HEMOGLOBIN: 11.8 g/dL — AB (ref 13.0–18.0)
LYMPHS ABS: 1.1 10*3/uL (ref 1.0–3.6)
LYMPHS PCT: 17 %
MCH: 27.4 pg (ref 26.0–34.0)
MCHC: 34 g/dL (ref 32.0–36.0)
MCV: 80.5 fL (ref 80.0–100.0)
MONOS PCT: 6 %
Monocytes Absolute: 0.4 10*3/uL (ref 0.2–1.0)
NEUTROS ABS: 4.9 10*3/uL (ref 1.4–6.5)
NEUTROS PCT: 75 %
Platelets: 283 10*3/uL (ref 150–440)
RBC: 4.33 MIL/uL — AB (ref 4.40–5.90)
RDW: 13.8 % (ref 11.5–14.5)
WBC: 6.6 10*3/uL (ref 3.8–10.6)

## 2017-07-09 LAB — LACTATE DEHYDROGENASE: LDH: 188 U/L (ref 98–192)

## 2017-07-15 ENCOUNTER — Ambulatory Visit
Admission: RE | Admit: 2017-07-15 | Discharge: 2017-07-15 | Disposition: A | Discharge status: Home / Self Care | Payer: Medicare Other | Source: Ambulatory Visit | Attending: Oncology | Admitting: Oncology

## 2017-07-15 DIAGNOSIS — M899 Disorder of bone, unspecified: Secondary | ICD-10-CM | POA: Diagnosis not present

## 2017-07-15 DIAGNOSIS — R590 Localized enlarged lymph nodes: Secondary | ICD-10-CM | POA: Insufficient documentation

## 2017-07-15 DIAGNOSIS — C773 Secondary and unspecified malignant neoplasm of axilla and upper limb lymph nodes: Secondary | ICD-10-CM | POA: Diagnosis not present

## 2017-07-15 DIAGNOSIS — C439 Malignant melanoma of skin, unspecified: Secondary | ICD-10-CM | POA: Insufficient documentation

## 2017-07-15 LAB — GLUCOSE, CAPILLARY: Glucose-Capillary: 90 mg/dL (ref 65–99)

## 2017-07-15 MED ORDER — FLUDEOXYGLUCOSE F - 18 (FDG) INJECTION
13.0000 | Freq: Once | INTRAVENOUS | Status: AC
  Administered 2017-07-15: 13.17 via INTRAVENOUS

## 2017-07-15 NOTE — Progress Notes (Signed)
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  INTERVAL HISTORY: Patient returns to clinic today for further evaluation, discussion of his PET scan results, and treatment planning. He is anxious, but otherwise feels well. He has no neurologic complaints. He denies any recent fevers or illnesses. He has a good appetite and denies weight loss. He has no chest pain or shortness of breath. He denies any nausea, vomiting, constipation, or diarrhea. He has no melena or hematochezia. He has no urinary complaints. Patient offers no further specific complaints.  REVIEW OF SYSTEMS:   Review of Systems  Constitutional: Negative.  Negative for fever, malaise/fatigue and weight loss.  Respiratory: Negative.  Negative for cough and shortness of breath.   Cardiovascular: Negative.  Negative for chest pain and leg swelling.  Gastrointestinal: Negative.  Negative for abdominal pain.  Genitourinary: Negative.   Musculoskeletal: Negative.   Skin: Negative.  Negative for rash.  Neurological: Negative.  Negative for sensory change and weakness.  Psychiatric/Behavioral: The patient is nervous/anxious.     As per HPI. Otherwise, a complete review of systems is negative.  PAST MEDICAL HISTORY: Past Medical History:  Diagnosis Date  . Anemia   . Arthritis   . Complication of anesthesia   . PONV (postoperative nausea and vomiting)     PAST SURGICAL HISTORY: Past Surgical History:  Procedure Laterality Date  . AXILLARY LYMPH NODE DISSECTION Left 06/26/2017   Procedure: EXCISION OF LEFT AXILLARY MASS;  Surgeon: Johnathan Hausen, MD;  Location: [facility] ORS;  Service: General;  Laterality: Left;  Marland Kitchen VASECTOMY      FAMILY HISTORY: Family History  Problem  Relation Age of Onset  . Diabetes Mother   . Hypertension Mother   . Leukemia Father   . Thyroid disease Sister   . Liver disease Maternal Grandmother   . Heart Problems Maternal Grandfather   . Diabetes Maternal Grandfather   . Lung cancer Paternal Grandmother   . Prostate cancer Paternal Grandfather     ADVANCED DIRECTIVES (Y/N):  N  HEALTH MAINTENANCE: Social History  Substance Use Topics  . Smoking status: Never Smoker  . Smokeless tobacco: Never Used  . Alcohol use No     Colonoscopy:  PAP:  Bone density:  Lipid panel:  No Known Allergies  Current Outpatient Prescriptions  Medication Sig Dispense Refill  . Multiple Vitamin (MULTIVITAMIN) tablet Take 1 tablet by mouth daily.     No current facility-administered medications for this visit.     OBJECTIVE: Vitals:   07/16/17 1433  BP: 137/72  Pulse: 72  Temp: 98 F (36.7 C)     Body mass index is 28.76 kg/m.    ECOG FS:0 - Asymptomatic  General: Well-developed, well-nourished, no acute distress. Eyes: Pink conjunctiva, anicteric sclera. HEENT: Normocephalic, moist mucous membranes, clear oropharnyx. Lungs: Clear to auscultation bilaterally. Heart: Regular rate and rhythm. No rubs, murmurs, or gallops. Abdomen: Soft, nontender, nondistended. No organomegaly noted, normoactive bowel sounds. Musculoskeletal: No edema, cyanosis, or clubbing. Neuro: Alert, answering all questions appropriately. Cranial nerves grossly intact. Skin: No rashes or petechiae noted. Psych: Normal affect.   LAB RESULTS:  Lab Results  Component Value Date   NA 138 07/09/2017   K 4.3 07/09/2017   CL 104 07/09/2017   CO2 27 07/09/2017   GLUCOSE 95  07/09/2017   BUN 16 07/09/2017   CREATININE 1.09 07/09/2017   CALCIUM 8.6 (L) 07/09/2017   PROT 7.1 07/09/2017   ALBUMIN 3.9 07/09/2017   AST 16 07/09/2017   ALT 12 (L) 07/09/2017   ALKPHOS 61 07/09/2017   BILITOT 0.6 07/09/2017   GFRNONAA >60 07/09/2017   GFRAA >60 07/09/2017      Lab Results  Component Value Date   WBC 6.6 07/09/2017   NEUTROABS 4.9 07/09/2017   HGB 11.8 (L) 07/09/2017   HCT 34.8 (L) 07/09/2017   MCV 80.5 07/09/2017   PLT 283 07/09/2017     STUDIES: Nm Pet Image Initial (pi) Whole Body  Result Date: 07/15/2017 CLINICAL DATA:  Initial treatment strategy for metastatic melanoma diagnosed on 06/26/2017 biopsy of markedly enlarged left axillary lymph node. EXAM: NUCLEAR MEDICINE PET WHOLE BODY TECHNIQUE: 13.2 mCi F-18 FDG was injected intravenously. Full-ring PET imaging was performed from the vertex to the feet after the radiotracer. CT data was obtained and used for attenuation correction and anatomic localization. FASTING BLOOD GLUCOSE:  Value: 90 mg/dl COMPARISON:  06/16/2017 axillary sonogram. FINDINGS: HEAD/NECK No hypermetabolic activity in the scalp. No hypermetabolic cervical lymph nodes. No hypermetabolic thyroid nodules. Subcentimeter hypodense non hypermetabolic left thyroid lobe nodule. CHEST Bulky hypermetabolic centrally necrotic 4.9 cm left axillary lymph node (series 3/ image 116) with max SUV 16.5. There is non hypermetabolic fat stranding and fluid density extending superficially and inferiorly from the hypermetabolic in lower left axillary lymph node to the skin surface (series 3/images 136-139). No additional enlarged or hypermetabolic axillary lymph nodes. Mildly enlarged and mildly hypermetabolic right subcarinal 1.1 cm node with max SUV 4.0 (series 3/image 131). Mildly hypermetabolic right hilar lymphadenopathy with max SUV 4.1. No hypermetabolic left hilar adenopathy. No pneumothorax. No pleural effusions. Tiny calcified anterior right middle lobe and peripheral left lower lobe granulomas. Two tiny solid right lower lobe pulmonary nodules, largest 3 mm (series 3/image 130), below PET resolution. No acute consolidative airspace disease, lung masses or additional significant pulmonary nodules. ABDOMEN/PELVIS No abnormal hypermetabolic  activity within the liver, pancreas, adrenal glands, or spleen. No hypermetabolic lymph nodes in the abdomen or pelvis. Nonspecific mild hypermetabolism at the anorectal junction with max SUV 5.0, without CT correlate. Simple 1.0 cm anterior left liver lobe cyst. SKELETON Mildly expansile lytic 2.0 cm superior left iliac wing lesion with mild hypermetabolism with max SUV 3.1 (series 3/ image 229). No additional hypermetabolic skeletal lesions . EXTREMITIES No abnormal hypermetabolic activity in the lower extremities. IMPRESSION: 1. Bulky centrally necrotic hypermetabolic left axillary nodal metastasis. Fat stranding and fluid density without hypermetabolism extend inferiorly and superficially from this node to the skin surface, which may represent post biopsy change versus developing cutaneous fistula. 2. Hypermetabolic right subcarinal and right hilar lymphadenopathy, suspicious for nodal metastases. 3. Two tiny indeterminate solid right lower lobe pulmonary nodules, largest 3 mm, below PET resolution, recommend attention on follow-up chest CT in 3 months. 4. Solitary mildly expansile lytic osseous lesion in the superior left iliac wing, suspicious for bone metastasis. 5. No additional hypermetabolic sites of metastatic disease. 6. Nonspecific mild hypermetabolism at the anorectal junction without CT correlate, most likely physiologic. Correlate with clinical symptoms and exam. Electronically Signed   By: Ilona Sorrel M.D.   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  PLAN:    1. 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 BRAF  mutation is positive, therefore Zelboraf will be an option in the future. Will get MRI the brain to complete the staging workup. Given his metastatic disease, will proceed with combination immunotherapy using ipilumimab and nivolumab  every 3 weeks for 4 cycles and then continue with maintenance nivomumab every 2 weeks until progression of disease. Return to clinic on July 22, 2017 to initiate cycle 1 of 4 ipilumimab and nivolumab   Approximately 30 minutes was spent in discussion of which greater than 50% was consultation.  Patient expressed understanding and was in agreement with this plan. He also understands that He can call clinic at any time with any questions, concerns, or complaints.   Cancer Staging Melanoma of skin ([place]) Staging form: Melanoma of the Skin, AJCC 8th Edition - Clinical stage from 07/15/2017: Stage IV (cTX, cN1, cM1c(0)) - Signed by Lloyd Huger, MD on 07/15/2017   Lloyd Huger, MD   07/18/2017 4:21 PM

## 2017-07-15 NOTE — Addendum Note (Signed)
Addended by: Lloyd Huger on: 07/15/2017 09:48 AM   Modules accepted: Level of Service

## 2017-07-16 ENCOUNTER — Inpatient Hospital Stay (HOSPITAL_BASED_OUTPATIENT_CLINIC_OR_DEPARTMENT_OTHER): Payer: Medicare Other | Admitting: Oncology

## 2017-07-16 VITALS — BP 137/72 | HR 72 | Temp 98.0°F | Wt 206.2 lb

## 2017-07-16 DIAGNOSIS — C439 Malignant melanoma of skin, unspecified: Secondary | ICD-10-CM | POA: Diagnosis not present

## 2017-07-16 DIAGNOSIS — M129 Arthropathy, unspecified: Secondary | ICD-10-CM | POA: Diagnosis not present

## 2017-07-16 DIAGNOSIS — D649 Anemia, unspecified: Secondary | ICD-10-CM

## 2017-07-16 DIAGNOSIS — Z8042 Family history of malignant neoplasm of prostate: Secondary | ICD-10-CM

## 2017-07-16 DIAGNOSIS — C792 Secondary malignant neoplasm of skin: Secondary | ICD-10-CM | POA: Diagnosis not present

## 2017-07-16 DIAGNOSIS — Z801 Family history of malignant neoplasm of trachea, bronchus and lung: Secondary | ICD-10-CM

## 2017-07-16 DIAGNOSIS — Z79899 Other long term (current) drug therapy: Secondary | ICD-10-CM

## 2017-07-16 LAB — SURGICAL PATHOLOGY

## 2017-07-16 NOTE — Patient Instructions (Signed)
Ipilimumab injection What is this medicine? IPILIMUMAB (IP i LIM ue mab) is a monoclonal antibody. It is used to treat melanoma, a type of skin cancer. This medicine may be used for other purposes; ask your health care provider or pharmacist if you have questions. COMMON BRAND NAME(S): YERVOY What should I tell my health care provider before I take this medicine? They need to know if you have any of these conditions: -[place]'s disease -blood in your stools (black or tarry stools) or if you have blood in your vomit -eye disease, vision problems -history of pancreatitis -history of stomach bleeding -immune system problems -inflammatory bowel disease -kidney disease -liver disease -lupus -myasthenia gravis -organ transplant -rheumatoid arthritis -sarcoidosis -stomach or intestine problems -thyroid disease -tingling of the fingers or toes, or other nerve disorder -an unusual or allergic reaction to ipilimumab, other medicines, foods, dyes, or preservatives -pregnant or trying to get pregnant -breast-feeding How should I use this medicine? This medicine is for infusion into a vein. It is given by a health care professional in a hospital or clinic setting. A special MedGuide will be given to you before each treatment. Be sure to read this information carefully each time. Talk to your pediatrician regarding the use of this medicine in children. While this drug may be prescribed for children as young as 12 years for selected conditions, precautions do apply. Overdosage: If you think you have taken too much of this medicine contact a poison control center or emergency room at once. NOTE: This medicine is only for you. Do not share this medicine with others. What if I miss a dose? It is important not to miss your dose. Call your doctor or health care professional if you are unable to keep an appointment. What may interact with this medicine? Interactions are not expected. This list may  not describe all possible interactions. Give your health care provider a list of all the medicines, herbs, non-prescription drugs, or dietary supplements you use. Also tell them if you smoke, drink alcohol, or use illegal drugs. Some items may interact with your medicine. What should I watch for while using this medicine? Tell your doctor or healthcare professional if your symptoms do not start to get better or if they get worse. Do not become pregnant while taking this medicine or for 3 months after stopping it. Women should inform their doctor if they wish to become pregnant or think they might be pregnant. There is a potential for serious side effects to an unborn child. Talk to your health care professional or pharmacist for more information. Do not breast-feed an infant while taking this medicine or for 3 months after the last dose. Your condition will be monitored carefully while you are receiving this medicine. You may need blood work done while you are taking this medicine. What side effects may I notice from receiving this medicine? Side effects that you should report to your doctor or health care professional as soon as possible: -allergic reactions like skin rash, itching or hives, swelling of the face, lips, or tongue -black, tarry stools -bloody or watery diarrhea -changes in vision -dizziness -eye pain -fast, irregular heartbeat -feeling anxious -feeling faint or lightheaded, falls -nausea, vomiting -pain, tingling, numbness in the hands or feet -redness, blistering, peeling or loosening of the skin, including inside the mouth -signs and symptoms of liver injury like dark yellow or brown urine; general ill feeling or flu-like symptoms; light-colored stools; loss of appetite; nausea; right upper belly pain; unusually weak   or tired; yellowing of the eyes or skin -unusual bleeding or bruising Side effects that usually do not require medical attention (report to your doctor or health  care professional if they continue or are bothersome): -headache -loss of appetite -trouble sleeping This list may not describe all possible side effects. Call your doctor for medical advice about side effects. You may report side effects to FDA at 1-800-FDA-1088. Where should I keep my medicine? This drug is given in a hospital or clinic and will not be stored at home. NOTE: This sheet is a summary. It may not cover all possible information. If you have questions about this medicine, talk to your doctor, pharmacist, or health care provider.  2018 Elsevier/Gold Standard (2016-05-13 11:41:46) Nivolumab injection What is this medicine? NIVOLUMAB (nye VOL ue mab) is a monoclonal antibody. It is used to treat melanoma, lung cancer, kidney cancer, head and neck cancer, Hodgkin lymphoma, urothelial cancer, colon cancer, and liver cancer. This medicine may be used for other purposes; ask your health care provider or pharmacist if you have questions. COMMON BRAND NAME(S): Opdivo What should I tell my health care provider before I take this medicine? They need to know if you have any of these conditions: -diabetes -immune system problems -kidney disease -liver disease -lung disease -organ transplant -stomach or intestine problems -thyroid disease -an unusual or allergic reaction to nivolumab, other medicines, foods, dyes, or preservatives -pregnant or trying to get pregnant -breast-feeding How should I use this medicine? This medicine is for infusion into a vein. It is given by a health care professional in a hospital or clinic setting. A special MedGuide will be given to you before each treatment. Be sure to read this information carefully each time. Talk to your pediatrician regarding the use of this medicine in children. While this drug may be prescribed for children as young as 12 years for selected conditions, precautions do apply. Overdosage: If you think you have taken too much of this  medicine contact a poison control center or emergency room at once. NOTE: This medicine is only for you. Do not share this medicine with others. What if I miss a dose? It is important not to miss your dose. Call your doctor or health care professional if you are unable to keep an appointment. What may interact with this medicine? Interactions have not been studied. Give your health care provider a list of all the medicines, herbs, non-prescription drugs, or dietary supplements you use. Also tell them if you smoke, drink alcohol, or use illegal drugs. Some items may interact with your medicine. This list may not describe all possible interactions. Give your health care provider a list of all the medicines, herbs, non-prescription drugs, or dietary supplements you use. Also tell them if you smoke, drink alcohol, or use illegal drugs. Some items may interact with your medicine. What should I watch for while using this medicine? This drug may make you feel generally unwell. Continue your course of treatment even though you feel ill unless your doctor tells you to stop. You may need blood work done while you are taking this medicine. Do not become pregnant while taking this medicine or for 5 months after stopping it. Women should inform their doctor if they wish to become pregnant or think they might be pregnant. There is a potential for serious side effects to an unborn child. Talk to your health care professional or pharmacist for more information. Do not breast-feed an infant while taking this medicine. What   side effects may I notice from receiving this medicine? Side effects that you should report to your doctor or health care professional as soon as possible: -allergic reactions like skin rash, itching or hives, swelling of the face, lips, or tongue -black, tarry stools -blood in the urine -bloody or watery diarrhea -changes in vision -change in sex drive -changes in emotions or moods -chest  pain -confusion -cough -decreased appetite -diarrhea -facial flushing -feeling faint or lightheaded -fever, chills -hair loss -hallucination, loss of contact with reality -headache -irritable -joint pain -loss of memory -muscle pain -muscle weakness -seizures -shortness of breath -signs and symptoms of high blood sugar such as dizziness; dry mouth; dry skin; fruity breath; nausea; stomach pain; increased hunger or thirst; increased urination -signs and symptoms of kidney injury like trouble passing urine or change in the amount of urine -signs and symptoms of liver injury like dark yellow or brown urine; general ill feeling or flu-like symptoms; light-colored stools; loss of appetite; nausea; right upper belly pain; unusually weak or tired; yellowing of the eyes or skin -stiff neck -swelling of the ankles, feet, hands -weight gain Side effects that usually do not require medical attention (report to your doctor or health care professional if they continue or are bothersome): -bone pain -constipation -tiredness -vomiting This list may not describe all possible side effects. Call your doctor for medical advice about side effects. You may report side effects to FDA at 1-800-FDA-1088. Where should I keep my medicine? This drug is given in a hospital or clinic and will not be stored at home. NOTE: This sheet is a summary. It may not cover all possible information. If you have questions about this medicine, talk to your doctor, pharmacist, or health care provider.  2018 Elsevier/Gold Standard (2016-07-14 17:49:34)  

## 2017-07-16 NOTE — Progress Notes (Signed)
Pt here today for results of PET scan. Denies any difficulties at this time.

## 2017-07-17 ENCOUNTER — Encounter: Payer: Self-pay | Admitting: Oncology

## 2017-07-17 ENCOUNTER — Other Ambulatory Visit: Payer: Self-pay

## 2017-07-17 DIAGNOSIS — C439 Malignant melanoma of skin, unspecified: Secondary | ICD-10-CM

## 2017-07-18 NOTE — Progress Notes (Signed)
START ON PATHWAY REGIMEN - Melanoma   Nivolumab 1 mg/kg + Ipilimumab 3 mg/kg q21 Days x 4 Doses:   A cycle is every 21 days:     Nivolumab      Ipilimumab   **Always confirm dose/schedule in your pharmacy ordering system**    Nivolumab 240 mg q14 Days:   A cycle is every 14 days:     Nivolumab   **Always confirm dose/schedule in your pharmacy ordering system**    Patient Characteristics: Stage IV, Unresectable, Asymptomatic, First Line, BRAF V600 Activating Mutation Positive Disease Subtype: Cutaneous Current Disease Status: Distant Metastases AJCC 8 Stage Grouping: IV AJCC T Category: [place] AJCC N Category: NX AJCC M Category: M1c(0) Mutation Status: BRAF V600 Activating Mutation Positive Metastatic Disease Type: Asymptomatic Would you be surprised if this patient died  in the next year<= I would be surprised if this patient died in the next year Line of Therapy: First Line Intent of Therapy: Non-Curative / Palliative Intent, Discussed with Patient

## 2017-07-20 ENCOUNTER — Ambulatory Visit
Admission: RE | Admit: 2017-07-20 | Discharge: 2017-07-20 | Disposition: A | Discharge status: Home / Self Care | Payer: Medicare Other | Source: Ambulatory Visit | Attending: Oncology | Admitting: Oncology

## 2017-07-20 DIAGNOSIS — C439 Malignant melanoma of skin, unspecified: Secondary | ICD-10-CM | POA: Insufficient documentation

## 2017-07-20 MED ORDER — GADOBENATE DIMEGLUMINE 529 MG/ML IV SOLN
20.0000 mL | Freq: Once | INTRAVENOUS | Status: AC | PRN
  Administered 2017-07-20: 20 mL via INTRAVENOUS

## 2017-07-20 NOTE — Progress Notes (Signed)
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  INTERVAL HISTORY: Patient returns to clinic today for further evaluation and initiation of cycle 1 of 4 of ipilumumab and nivolumab. He is anxious, but otherwise feels well. He has no neurologic complaints. He denies any recent fevers or illnesses. He has a good appetite and denies weight loss. He has no chest pain or shortness of breath. He denies any nausea, vomiting, constipation, or diarrhea. He has no melena or hematochezia. He has no urinary complaints. Patient offers no further specific complaints.  REVIEW OF SYSTEMS:   Review of Systems  Constitutional: Negative.  Negative for fever, malaise/fatigue and weight loss.  Respiratory: Negative.  Negative for cough and shortness of breath.   Cardiovascular: Negative.  Negative for chest pain and leg swelling.  Gastrointestinal: Negative.  Negative for abdominal pain.  Genitourinary: Negative.   Musculoskeletal: Negative.   Skin: Negative.  Negative for rash.  Neurological: Negative.  Negative for sensory change and weakness.  Psychiatric/Behavioral: The patient is nervous/anxious.     As per HPI. Otherwise, a complete review of systems is negative.  PAST MEDICAL HISTORY: Past Medical History:  Diagnosis Date  . Anemia   . Arthritis   . Complication of anesthesia   . PONV (postoperative nausea and vomiting)     PAST SURGICAL HISTORY: Past Surgical History:  Procedure Laterality Date  . AXILLARY LYMPH NODE DISSECTION Left 06/26/2017   Procedure: EXCISION OF LEFT AXILLARY MASS;  Surgeon: Johnathan Hausen, MD;  Location: [facility] ORS;  Service: General;  Laterality: Left;  Marland Kitchen VASECTOMY      FAMILY HISTORY: Family History  Problem  Relation Age of Onset  . Diabetes Mother   . Hypertension Mother   . Leukemia Father   . Thyroid disease Sister   . Liver disease Maternal Grandmother   . Heart Problems Maternal Grandfather   . Diabetes Maternal Grandfather   . Lung cancer Paternal Grandmother   . Prostate cancer Paternal Grandfather     ADVANCED DIRECTIVES (Y/N):  N  HEALTH MAINTENANCE: Social History  Substance Use Topics  . Smoking status: Never Smoker  . Smokeless tobacco: Never Used  . Alcohol use No     Colonoscopy:  PAP:  Bone density:  Lipid panel:  No Known Allergies  Current Outpatient Prescriptions  Medication Sig Dispense Refill  . Multiple Vitamin (MULTIVITAMIN) tablet Take 1 tablet by mouth daily.     No current facility-administered medications for this visit.     OBJECTIVE: Vitals:   07/22/17 0950  BP: 138/80  Pulse: 69  Temp: 97.9 F (36.6 C)     Body mass index is 28.73 kg/m.    ECOG FS:0 - Asymptomatic  General: Well-developed, well-nourished, no acute distress. Eyes: Pink conjunctiva, anicteric sclera. Lungs: Clear to auscultation bilaterally. Heart: Regular rate and rhythm. No rubs, murmurs, or gallops. Abdomen: Soft, nontender, nondistended. No organomegaly noted, normoactive bowel sounds. Musculoskeletal: No edema, cyanosis, or clubbing. Neuro: Alert, answering all questions appropriately. Cranial nerves grossly intact. Skin: No rashes or petechiae noted. Psych: Normal affect.   LAB RESULTS:  Lab Results  Component Value Date   NA 138 07/22/2017   K 3.8 07/22/2017   CL 107 07/22/2017   CO2 25 07/22/2017   GLUCOSE 116 (H) 07/22/2017   BUN  17 07/22/2017   CREATININE 0.91 07/22/2017   CALCIUM 8.9 07/22/2017   PROT 7.3 07/22/2017   ALBUMIN 4.1 07/22/2017   AST 21 07/22/2017   ALT 13 (L) 07/22/2017   ALKPHOS 69 07/22/2017   BILITOT 0.6 07/22/2017   GFRNONAA >60 07/22/2017   GFRAA >60 07/22/2017    Lab Results  Component Value Date   WBC 4.4 07/22/2017    NEUTROABS 2.9 07/22/2017   HGB 12.5 (L) 07/22/2017   HCT 36.7 (L) 07/22/2017   MCV 78.9 (L) 07/22/2017   PLT 231 07/22/2017     STUDIES: Mr Jeri Cos QM Contrast  Result Date: 07/21/2017 CLINICAL DATA:  Metastatic melanoma. EXAM: MRI HEAD WITHOUT AND WITH CONTRAST TECHNIQUE: Multiplanar, multiecho pulse sequences of the brain and surrounding structures were obtained without and with intravenous contrast. CONTRAST:  64m MULTIHANCE GADOBENATE DIMEGLUMINE 529 MG/ML IV SOLN COMPARISON:  None. FINDINGS: INTRACRANIAL CONTENTS: No reduced diffusion to suggest acute ischemia or hypercellular tumor. No susceptibility artifact to suggest hemorrhage. The ventricles and sulci are normal for patient's age. No suspicious parenchymal signal, masses, mass effect. No abnormal intraparenchymal or extra-axial enhancement. No abnormal extra-axial fluid collections. No extra-axial masses. VASCULAR: Normal major intracranial vascular flow voids present at skull base. SKULL AND UPPER CERVICAL SPINE: No abnormal sellar expansion. No suspicious calvarial bone marrow signal. Craniocervical junction maintained. SINUSES/ORBITS: Trace paranasal sinus mucosal thickening. Mastoid air cells are well aerated.The included ocular globes and orbital contents are non-suspicious. OTHER: None. IMPRESSION: No intracranial metastasis ; normal MRI of the brain with and without contrast. Electronically Signed   By: CElon AlasM.D.   On: 07/21/2017 00:15   Nm Pet Image Initial (pi) Whole Body  Result Date: 07/15/2017 CLINICAL DATA:  Initial treatment strategy for metastatic melanoma diagnosed on 06/26/2017 biopsy of markedly enlarged left axillary lymph node. EXAM: NUCLEAR MEDICINE PET WHOLE BODY TECHNIQUE: 13.2 mCi F-18 FDG was injected intravenously. Full-ring PET imaging was performed from the vertex to the feet after the radiotracer. CT data was obtained and used for attenuation correction and anatomic localization. FASTING BLOOD  GLUCOSE:  Value: 90 mg/dl COMPARISON:  06/16/2017 axillary sonogram. FINDINGS: HEAD/NECK No hypermetabolic activity in the scalp. No hypermetabolic cervical lymph nodes. No hypermetabolic thyroid nodules. Subcentimeter hypodense non hypermetabolic left thyroid lobe nodule. CHEST Bulky hypermetabolic centrally necrotic 4.9 cm left axillary lymph node (series 3/ image 116) with max SUV 16.5. There is non hypermetabolic fat stranding and fluid density extending superficially and inferiorly from the hypermetabolic in lower left axillary lymph node to the skin surface (series 3/images 136-139). No additional enlarged or hypermetabolic axillary lymph nodes. Mildly enlarged and mildly hypermetabolic right subcarinal 1.1 cm node with max SUV 4.0 (series 3/image 131). Mildly hypermetabolic right hilar lymphadenopathy with max SUV 4.1. No hypermetabolic left hilar adenopathy. No pneumothorax. No pleural effusions. Tiny calcified anterior right middle lobe and peripheral left lower lobe granulomas. Two tiny solid right lower lobe pulmonary nodules, largest 3 mm (series 3/image 130), below PET resolution. No acute consolidative airspace disease, lung masses or additional significant pulmonary nodules. ABDOMEN/PELVIS No abnormal hypermetabolic activity within the liver, pancreas, adrenal glands, or spleen. No hypermetabolic lymph nodes in the abdomen or pelvis. Nonspecific mild hypermetabolism at the anorectal junction with max SUV 5.0, without CT correlate. Simple 1.0 cm anterior left liver lobe cyst. SKELETON Mildly expansile lytic 2.0 cm superior left iliac wing lesion with mild hypermetabolism with max SUV 3.1 (series 3/ image 229). No additional hypermetabolic skeletal lesions . EXTREMITIES No abnormal  hypermetabolic activity in the lower extremities. IMPRESSION: 1. Bulky centrally necrotic hypermetabolic left axillary nodal metastasis. Fat stranding and fluid density without hypermetabolism extend inferiorly and  superficially from this node to the skin surface, which may represent post biopsy change versus developing cutaneous fistula. 2. Hypermetabolic right subcarinal and right hilar lymphadenopathy, suspicious for nodal metastases. 3. Two tiny indeterminate solid right lower lobe pulmonary nodules, largest 3 mm, below PET resolution, recommend attention on follow-up chest CT in 3 months. 4. Solitary mildly expansile lytic osseous lesion in the superior left iliac wing, suspicious for bone metastasis. 5. No additional hypermetabolic sites of metastatic disease. 6. Nonspecific mild hypermetabolism at the anorectal junction without CT correlate, most likely physiologic. Correlate with clinical symptoms and exam. Electronically Signed   By: Ilona Sorrel M.D.   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  PLAN:    1. 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 BRAF mutation is positive, therefore Zelboraf will be an option in the future. MRI the brain is negative. Given his metastatic disease, will proceed with combination immunotherapy using ipilumimab and nivolumab every 3 weeks for 4 cycles and then continue with maintenance nivomumab every 2 weeks until progression of disease. Proceed with cycle 1 of 4 ipilumimab and nivolumab today. Return to clinic in 3 weeks for consideration of cycle 2.  Approximately 30 minutes was spent in discussion of which greater than 50% was consultation.  Patient expressed understanding and was in agreement with this plan. He also understands that He can call clinic at any time with any questions, concerns, or complaints.   Cancer Staging Melanoma of skin ([place]) Staging form: Melanoma of the Skin, AJCC 8th Edition - Clinical stage from 07/15/2017: Stage IV (cTX, cN1, cM1c(0)) - Signed by Lloyd Huger, MD on  07/15/2017   Lloyd Huger, MD   07/22/2017 4:15 PM

## 2017-07-21 ENCOUNTER — Inpatient Hospital Stay: Payer: Medicare Other | Attending: Oncology

## 2017-07-21 DIAGNOSIS — C7951 Secondary malignant neoplasm of bone: Secondary | ICD-10-CM | POA: Insufficient documentation

## 2017-07-21 DIAGNOSIS — K7689 Other specified diseases of liver: Secondary | ICD-10-CM | POA: Insufficient documentation

## 2017-07-21 DIAGNOSIS — R590 Localized enlarged lymph nodes: Secondary | ICD-10-CM | POA: Insufficient documentation

## 2017-07-21 DIAGNOSIS — Z806 Family history of leukemia: Secondary | ICD-10-CM | POA: Insufficient documentation

## 2017-07-21 DIAGNOSIS — Z5112 Encounter for antineoplastic immunotherapy: Secondary | ICD-10-CM | POA: Insufficient documentation

## 2017-07-21 DIAGNOSIS — C439 Malignant melanoma of skin, unspecified: Secondary | ICD-10-CM | POA: Insufficient documentation

## 2017-07-21 DIAGNOSIS — C799 Secondary malignant neoplasm of unspecified site: Secondary | ICD-10-CM | POA: Insufficient documentation

## 2017-07-21 DIAGNOSIS — D649 Anemia, unspecified: Secondary | ICD-10-CM | POA: Insufficient documentation

## 2017-07-21 DIAGNOSIS — F419 Anxiety disorder, unspecified: Secondary | ICD-10-CM | POA: Insufficient documentation

## 2017-07-21 DIAGNOSIS — Z801 Family history of malignant neoplasm of trachea, bronchus and lung: Secondary | ICD-10-CM | POA: Insufficient documentation

## 2017-07-21 DIAGNOSIS — M129 Arthropathy, unspecified: Secondary | ICD-10-CM | POA: Insufficient documentation

## 2017-07-22 ENCOUNTER — Inpatient Hospital Stay (HOSPITAL_BASED_OUTPATIENT_CLINIC_OR_DEPARTMENT_OTHER): Payer: Medicare Other | Admitting: Oncology

## 2017-07-22 ENCOUNTER — Inpatient Hospital Stay: Payer: Medicare Other

## 2017-07-22 VITALS — BP 138/80 | HR 69 | Temp 97.9°F | Wt 206.0 lb

## 2017-07-22 DIAGNOSIS — F419 Anxiety disorder, unspecified: Secondary | ICD-10-CM | POA: Diagnosis not present

## 2017-07-22 DIAGNOSIS — C799 Secondary malignant neoplasm of unspecified site: Secondary | ICD-10-CM

## 2017-07-22 DIAGNOSIS — R590 Localized enlarged lymph nodes: Secondary | ICD-10-CM

## 2017-07-22 DIAGNOSIS — Z801 Family history of malignant neoplasm of trachea, bronchus and lung: Secondary | ICD-10-CM | POA: Diagnosis not present

## 2017-07-22 DIAGNOSIS — K7689 Other specified diseases of liver: Secondary | ICD-10-CM | POA: Diagnosis not present

## 2017-07-22 DIAGNOSIS — C439 Malignant melanoma of skin, unspecified: Secondary | ICD-10-CM

## 2017-07-22 DIAGNOSIS — D649 Anemia, unspecified: Secondary | ICD-10-CM | POA: Diagnosis not present

## 2017-07-22 DIAGNOSIS — Z806 Family history of leukemia: Secondary | ICD-10-CM

## 2017-07-22 DIAGNOSIS — M129 Arthropathy, unspecified: Secondary | ICD-10-CM | POA: Diagnosis not present

## 2017-07-22 DIAGNOSIS — Z5112 Encounter for antineoplastic immunotherapy: Secondary | ICD-10-CM | POA: Diagnosis not present

## 2017-07-22 DIAGNOSIS — C7951 Secondary malignant neoplasm of bone: Secondary | ICD-10-CM

## 2017-07-22 LAB — CBC WITH DIFFERENTIAL/PLATELET
BASOS ABS: 0 10*3/uL (ref 0–0.1)
Basophils Relative: 1 %
Eosinophils Absolute: 0.1 10*3/uL (ref 0–0.7)
Eosinophils Relative: 2 %
HEMATOCRIT: 36.7 % — AB (ref 40.0–52.0)
Hemoglobin: 12.5 g/dL — ABNORMAL LOW (ref 13.0–18.0)
LYMPHS PCT: 23 %
Lymphs Abs: 1 10*3/uL (ref 1.0–3.6)
MCH: 27 pg (ref 26.0–34.0)
MCHC: 34.2 g/dL (ref 32.0–36.0)
MCV: 78.9 fL — AB (ref 80.0–100.0)
MONO ABS: 0.4 10*3/uL (ref 0.2–1.0)
Monocytes Relative: 8 %
NEUTROS ABS: 2.9 10*3/uL (ref 1.4–6.5)
Neutrophils Relative %: 66 %
Platelets: 231 10*3/uL (ref 150–440)
RBC: 4.65 MIL/uL (ref 4.40–5.90)
RDW: 14.4 % (ref 11.5–14.5)
WBC: 4.4 10*3/uL (ref 3.8–10.6)

## 2017-07-22 LAB — COMPREHENSIVE METABOLIC PANEL
ALT: 13 U/L — AB (ref 17–63)
AST: 21 U/L (ref 15–41)
Albumin: 4.1 g/dL (ref 3.5–5.0)
Alkaline Phosphatase: 69 U/L (ref 38–126)
Anion gap: 6 (ref 5–15)
BILIRUBIN TOTAL: 0.6 mg/dL (ref 0.3–1.2)
BUN: 17 mg/dL (ref 6–20)
CO2: 25 mmol/L (ref 22–32)
CREATININE: 0.91 mg/dL (ref 0.61–1.24)
Calcium: 8.9 mg/dL (ref 8.9–10.3)
Chloride: 107 mmol/L (ref 101–111)
GFR calc Af Amer: >60 mL/min (ref >60)
Glucose, Bld: 116 mg/dL — ABNORMAL HIGH (ref 65–99)
Potassium: 3.8 mmol/L (ref 3.5–5.1)
Sodium: 138 mmol/L (ref 135–145)
Total Protein: 7.3 g/dL (ref 6.5–8.1)

## 2017-07-22 MED ORDER — SODIUM CHLORIDE 0.9 % IV SOLN
Freq: Once | INTRAVENOUS | Status: AC
  Administered 2017-07-22: 11:00:00 via INTRAVENOUS
  Filled 2017-07-22: qty 1000

## 2017-07-22 MED ORDER — SODIUM CHLORIDE 0.9 % IV SOLN
100.0000 mg | Freq: Once | INTRAVENOUS | Status: AC
  Administered 2017-07-22: 100 mg via INTRAVENOUS
  Filled 2017-07-22: qty 20

## 2017-07-22 MED ORDER — SODIUM CHLORIDE 0.9 % IV SOLN
3.0000 mg/kg | Freq: Once | INTRAVENOUS | Status: AC
  Administered 2017-07-22: 280 mg via INTRAVENOUS
  Filled 2017-07-22: qty 24

## 2017-07-23 LAB — THYROID PANEL WITH TSH
FREE THYROXINE INDEX: 2.1 (ref 1.2–4.9)
T3 Uptake Ratio: 31 % (ref 24–39)
T4 TOTAL: 6.7 ug/dL (ref 4.5–12.0)
TSH: 4.54 u[IU]/mL — AB (ref 0.450–4.500)

## 2017-08-11 NOTE — Progress Notes (Signed)
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  INTERVAL HISTORY: Patient returns to clinic today for further evaluation and consideration of cycle 2 of 4 of ipilumumab and nivolumab. He tolerated his first treatment well without significant side effects. He currently feels well and is asymptomatic. He has no neurologic complaints. He denies any recent fevers or illnesses. He has a good appetite and denies weight loss. He has no chest pain or shortness of breath. He denies any nausea, vomiting, constipation, or diarrhea. He has no melena or hematochezia. He has no urinary complaints. Patient offers no specific complaints today.  REVIEW OF SYSTEMS:   Review of Systems  Constitutional: Negative.  Negative for fever, malaise/fatigue and weight loss.  Respiratory: Negative.  Negative for cough and shortness of breath.   Cardiovascular: Negative.  Negative for chest pain and leg swelling.  Gastrointestinal: Negative.  Negative for abdominal pain.  Genitourinary: Negative.   Musculoskeletal: Negative.   Skin: Negative.  Negative for rash.  Neurological: Negative.  Negative for sensory change and weakness.  Psychiatric/Behavioral: The patient is nervous/anxious.     As per HPI. Otherwise, a complete review of systems is negative.  PAST MEDICAL HISTORY: Past Medical History:  Diagnosis Date  . Anemia   . Arthritis   . Complication of anesthesia   . PONV (postoperative nausea and vomiting)     PAST SURGICAL HISTORY: Past Surgical History:  Procedure Laterality Date  . AXILLARY LYMPH NODE DISSECTION Left 06/26/2017   Procedure: EXCISION OF LEFT AXILLARY MASS;  Surgeon: Johnathan Hausen, MD;  Location: [facility] ORS;  Service: General;   Laterality: Left;  Marland Kitchen VASECTOMY      FAMILY HISTORY: Family History  Problem Relation Age of Onset  . Diabetes Mother   . Hypertension Mother   . Leukemia Father   . Thyroid disease Sister   . Liver disease Maternal Grandmother   . Heart Problems Maternal Grandfather   . Diabetes Maternal Grandfather   . Lung cancer Paternal Grandmother   . Prostate cancer Paternal Grandfather     ADVANCED DIRECTIVES (Y/N):  N  HEALTH MAINTENANCE: Social History  Substance Use Topics  . Smoking status: Never Smoker  . Smokeless tobacco: Never Used  . Alcohol use No     Colonoscopy:  PAP:  Bone density:  Lipid panel:  No Known Allergies  Current Outpatient Prescriptions  Medication Sig Dispense Refill  . Multiple Vitamin (MULTIVITAMIN) tablet Take 1 tablet by mouth daily.     No current facility-administered medications for this visit.     OBJECTIVE: Vitals:   08/12/17 0931  BP: (!) 155/79  Pulse: (!) 51  Resp: 20  Temp: (!) 96.4 F (35.8 C)     Body mass index is 28.26 kg/m.    ECOG FS:0 - Asymptomatic  General: Well-developed, well-nourished, no acute distress. Eyes: Pink conjunctiva, anicteric sclera. Lungs: Clear to auscultation bilaterally. Heart: Regular rate and rhythm. No rubs, murmurs, or gallops. Abdomen: Soft, nontender, nondistended. No organomegaly noted, normoactive bowel sounds. Musculoskeletal: No edema, cyanosis, or clubbing. Neuro: Alert, answering all questions appropriately. Cranial nerves grossly intact. Skin: No rashes or petechiae noted. Psych: Normal affect.   LAB RESULTS:  Lab Results  Component Value Date   NA 139 08/12/2017   K 4.1 08/12/2017   CL  106 08/12/2017   CO2 24 08/12/2017   GLUCOSE 93 08/12/2017   BUN 17 08/12/2017   CREATININE 0.94 08/12/2017   CALCIUM 8.8 (L) 08/12/2017   PROT 7.2 08/12/2017   ALBUMIN 3.9 08/12/2017   AST 24 08/12/2017   ALT 15 (L) 08/12/2017   ALKPHOS 71 08/12/2017   BILITOT 0.6 08/12/2017    GFRNONAA >60 08/12/2017   GFRAA >60 08/12/2017    Lab Results  Component Value Date   WBC 3.7 (L) 08/12/2017   NEUTROABS 2.4 08/12/2017   HGB 12.6 (L) 08/12/2017   HCT 38.0 (L) 08/12/2017   MCV 79.3 (L) 08/12/2017   PLT 226 08/12/2017     STUDIES: Mr Jeri Cos WK Contrast  Result Date: 07/21/2017 CLINICAL DATA:  Metastatic melanoma. EXAM: MRI HEAD WITHOUT AND WITH CONTRAST TECHNIQUE: Multiplanar, multiecho pulse sequences of the brain and surrounding structures were obtained without and with intravenous contrast. CONTRAST:  56m MULTIHANCE GADOBENATE DIMEGLUMINE 529 MG/ML IV SOLN COMPARISON:  None. FINDINGS: INTRACRANIAL CONTENTS: No reduced diffusion to suggest acute ischemia or hypercellular tumor. No susceptibility artifact to suggest hemorrhage. The ventricles and sulci are normal for patient's age. No suspicious parenchymal signal, masses, mass effect. No abnormal intraparenchymal or extra-axial enhancement. No abnormal extra-axial fluid collections. No extra-axial masses. VASCULAR: Normal major intracranial vascular flow voids present at skull base. SKULL AND UPPER CERVICAL SPINE: No abnormal sellar expansion. No suspicious calvarial bone marrow signal. Craniocervical junction maintained. SINUSES/ORBITS: Trace paranasal sinus mucosal thickening. Mastoid air cells are well aerated.The included ocular globes and orbital contents are non-suspicious. OTHER: None. IMPRESSION: No intracranial metastasis ; normal MRI of the brain with and without contrast. Electronically Signed   By: CElon AlasM.D.   On: 07/21/2017 00:15   Nm Pet Image Initial (pi) Whole Body  Result Date: 07/15/2017 CLINICAL DATA:  Initial treatment strategy for metastatic melanoma diagnosed on 06/26/2017 biopsy of markedly enlarged left axillary lymph node. EXAM: NUCLEAR MEDICINE PET WHOLE BODY TECHNIQUE: 13.2 mCi F-18 FDG was injected intravenously. Full-ring PET imaging was performed from the vertex to the feet after  the radiotracer. CT data was obtained and used for attenuation correction and anatomic localization. FASTING BLOOD GLUCOSE:  Value: 90 mg/dl COMPARISON:  06/16/2017 axillary sonogram. FINDINGS: HEAD/NECK No hypermetabolic activity in the scalp. No hypermetabolic cervical lymph nodes. No hypermetabolic thyroid nodules. Subcentimeter hypodense non hypermetabolic left thyroid lobe nodule. CHEST Bulky hypermetabolic centrally necrotic 4.9 cm left axillary lymph node (series 3/ image 116) with max SUV 16.5. There is non hypermetabolic fat stranding and fluid density extending superficially and inferiorly from the hypermetabolic in lower left axillary lymph node to the skin surface (series 3/images 136-139). No additional enlarged or hypermetabolic axillary lymph nodes. Mildly enlarged and mildly hypermetabolic right subcarinal 1.1 cm node with max SUV 4.0 (series 3/image 131). Mildly hypermetabolic right hilar lymphadenopathy with max SUV 4.1. No hypermetabolic left hilar adenopathy. No pneumothorax. No pleural effusions. Tiny calcified anterior right middle lobe and peripheral left lower lobe granulomas. Two tiny solid right lower lobe pulmonary nodules, largest 3 mm (series 3/image 130), below PET resolution. No acute consolidative airspace disease, lung masses or additional significant pulmonary nodules. ABDOMEN/PELVIS No abnormal hypermetabolic activity within the liver, pancreas, adrenal glands, or spleen. No hypermetabolic lymph nodes in the abdomen or pelvis. Nonspecific mild hypermetabolism at the anorectal junction with max SUV 5.0, without CT correlate. Simple 1.0 cm anterior left liver lobe cyst. SKELETON Mildly expansile lytic 2.0 cm superior left iliac wing lesion with mild hypermetabolism  with max SUV 3.1 (series 3/ image 229). No additional hypermetabolic skeletal lesions . EXTREMITIES No abnormal hypermetabolic activity in the lower extremities. IMPRESSION: 1. Bulky centrally necrotic hypermetabolic left  axillary nodal metastasis. Fat stranding and fluid density without hypermetabolism extend inferiorly and superficially from this node to the skin surface, which may represent post biopsy change versus developing cutaneous fistula. 2. Hypermetabolic right subcarinal and right hilar lymphadenopathy, suspicious for nodal metastases. 3. Two tiny indeterminate solid right lower lobe pulmonary nodules, largest 3 mm, below PET resolution, recommend attention on follow-up chest CT in 3 months. 4. Solitary mildly expansile lytic osseous lesion in the superior left iliac wing, suspicious for bone metastasis. 5. No additional hypermetabolic sites of metastatic disease. 6. Nonspecific mild hypermetabolism at the anorectal junction without CT correlate, most likely physiologic. Correlate with clinical symptoms and exam. Electronically Signed   By: Ilona Sorrel M.D.   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  PLAN:    1. 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 BRAF mutation is positive, therefore Zelboraf will be an option in the future. MRI the brain is negative. Given his metastatic disease, will proceed with combination immunotherapy using ipilumimab and nivolumab every 3 weeks for 4 cycles and then continue with maintenance nivomumab every 2 weeks until progression of disease. Proceed with cycle 2 of 4 ipilumimab and nivolumab today. Return to clinic in 3 weeks for consideration of cycle 3.  Approximately 30 minutes was spent in discussion of which greater than 50% was consultation.  Patient expressed understanding and was in agreement with this plan. He also understands that He can call clinic at any time with any questions, concerns, or complaints.   Cancer Staging Melanoma of skin ([place]) Staging form: Melanoma of the Skin, AJCC 8th Edition - Clinical  stage from 07/15/2017: Stage IV (cTX, cN1, cM1c(0)) - Signed by Louis Huger, MD on 07/15/2017   Louis Huger, MD   08/12/2017 10:05 AM

## 2017-08-12 ENCOUNTER — Inpatient Hospital Stay (HOSPITAL_BASED_OUTPATIENT_CLINIC_OR_DEPARTMENT_OTHER): Payer: Medicare Other | Admitting: Oncology

## 2017-08-12 ENCOUNTER — Other Ambulatory Visit: Payer: Self-pay | Admitting: Oncology

## 2017-08-12 ENCOUNTER — Inpatient Hospital Stay: Payer: Medicare Other

## 2017-08-12 VITALS — BP 155/79 | HR 51 | Temp 96.4°F | Resp 20 | Wt 202.6 lb

## 2017-08-12 DIAGNOSIS — C7951 Secondary malignant neoplasm of bone: Secondary | ICD-10-CM | POA: Diagnosis not present

## 2017-08-12 DIAGNOSIS — Z806 Family history of leukemia: Secondary | ICD-10-CM | POA: Diagnosis not present

## 2017-08-12 DIAGNOSIS — C779 Secondary and unspecified malignant neoplasm of lymph node, unspecified: Secondary | ICD-10-CM

## 2017-08-12 DIAGNOSIS — K7689 Other specified diseases of liver: Secondary | ICD-10-CM

## 2017-08-12 DIAGNOSIS — Z801 Family history of malignant neoplasm of trachea, bronchus and lung: Secondary | ICD-10-CM

## 2017-08-12 DIAGNOSIS — C439 Malignant melanoma of skin, unspecified: Secondary | ICD-10-CM

## 2017-08-12 DIAGNOSIS — R590 Localized enlarged lymph nodes: Secondary | ICD-10-CM

## 2017-08-12 DIAGNOSIS — M129 Arthropathy, unspecified: Secondary | ICD-10-CM | POA: Diagnosis not present

## 2017-08-12 DIAGNOSIS — D649 Anemia, unspecified: Secondary | ICD-10-CM

## 2017-08-12 DIAGNOSIS — F419 Anxiety disorder, unspecified: Secondary | ICD-10-CM | POA: Diagnosis not present

## 2017-08-12 LAB — COMPREHENSIVE METABOLIC PANEL
ALBUMIN: 3.9 g/dL (ref 3.5–5.0)
ALK PHOS: 71 U/L (ref 38–126)
ALT: 15 U/L — ABNORMAL LOW (ref 17–63)
ANION GAP: 9 (ref 5–15)
AST: 24 U/L (ref 15–41)
BUN: 17 mg/dL (ref 6–20)
CALCIUM: 8.8 mg/dL — AB (ref 8.9–10.3)
CO2: 24 mmol/L (ref 22–32)
Chloride: 106 mmol/L (ref 101–111)
Creatinine, Ser: 0.94 mg/dL (ref 0.61–1.24)
GFR calc Af Amer: >60 mL/min (ref >60)
GFR calc non Af Amer: >60 mL/min (ref >60)
GLUCOSE: 93 mg/dL (ref 65–99)
Potassium: 4.1 mmol/L (ref 3.5–5.1)
SODIUM: 139 mmol/L (ref 135–145)
Total Bilirubin: 0.6 mg/dL (ref 0.3–1.2)
Total Protein: 7.2 g/dL (ref 6.5–8.1)

## 2017-08-12 LAB — CBC WITH DIFFERENTIAL/PLATELET
BASOS ABS: 0 10*3/uL (ref 0–0.1)
BASOS PCT: 1 %
EOS ABS: 0.1 10*3/uL (ref 0–0.7)
Eosinophils Relative: 2 %
HCT: 38 % — ABNORMAL LOW (ref 40.0–52.0)
Hemoglobin: 12.6 g/dL — ABNORMAL LOW (ref 13.0–18.0)
Lymphocytes Relative: 19 %
Lymphs Abs: 0.7 10*3/uL — ABNORMAL LOW (ref 1.0–3.6)
MCH: 26.4 pg (ref 26.0–34.0)
MCHC: 33.3 g/dL (ref 32.0–36.0)
MCV: 79.3 fL — ABNORMAL LOW (ref 80.0–100.0)
MONOS PCT: 12 %
Monocytes Absolute: 0.4 10*3/uL (ref 0.2–1.0)
NEUTROS PCT: 66 %
Neutro Abs: 2.4 10*3/uL (ref 1.4–6.5)
Platelets: 226 10*3/uL (ref 150–440)
RBC: 4.79 MIL/uL (ref 4.40–5.90)
RDW: 15.1 % — ABNORMAL HIGH (ref 11.5–14.5)
WBC: 3.7 10*3/uL — AB (ref 3.8–10.6)

## 2017-08-12 MED ORDER — SODIUM CHLORIDE 0.9 % IV SOLN
Freq: Once | INTRAVENOUS | Status: AC
  Administered 2017-08-12: 11:00:00 via INTRAVENOUS
  Filled 2017-08-12: qty 1000

## 2017-08-12 MED ORDER — SODIUM CHLORIDE 0.9 % IV SOLN: 1.0000 mg/kg | Freq: Once | INTRAVENOUS | Status: DC

## 2017-08-12 MED ORDER — SODIUM CHLORIDE 0.9 % IV SOLN: 100.0000 mg | Freq: Once | INTRAVENOUS | Status: DC

## 2017-08-12 MED ORDER — SODIUM CHLORIDE 0.9 % IV SOLN: 3.0000 mg/kg | Freq: Once | INTRAVENOUS | Status: DC

## 2017-08-12 MED ORDER — SODIUM CHLORIDE 0.9 % IV SOLN
3.0000 mg/kg | Freq: Once | INTRAVENOUS | Status: AC
  Administered 2017-08-12: 280 mg via INTRAVENOUS
  Filled 2017-08-12: qty 40

## 2017-08-12 MED ORDER — SODIUM CHLORIDE 0.9 % IV SOLN
100.0000 mg | Freq: Once | INTRAVENOUS | Status: AC
  Administered 2017-08-12: 100 mg via INTRAVENOUS
  Filled 2017-08-12: qty 10

## 2017-08-12 NOTE — Progress Notes (Signed)
Confirmed dose with md : Opdivo dose 1mg /kg and Yervoy 3mg /kg for metastatic melanoma.

## 2017-08-12 NOTE — Progress Notes (Signed)
Patient denies any concerns today.  

## 2017-08-13 LAB — THYROID PANEL WITH TSH
Free Thyroxine Index: 2 (ref 1.2–4.9)
T3 UPTAKE RATIO: 32 % (ref 24–39)
T4, Total: 6.2 ug/dL (ref 4.5–12.0)
TSH: 3.09 u[IU]/mL (ref 0.450–4.500)

## 2017-08-30 NOTE — Progress Notes (Signed)
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  INTERVAL HISTORY: Patient returns to clinic today for further evaluation and consideration of cycle 3 of 4 of ipilumumab and nivolumab.  He has intermittent dyspnea on exertion, but otherwise feels well and is asymptomatic. He has no neurologic complaints. He denies any recent fevers or illnesses. He has a good appetite and denies weight loss. He has no chest pain, cough, or hemoptysis. He denies any nausea, vomiting, constipation, or diarrhea. He has no melena or hematochezia. He has no urinary complaints. Patient offers no further specific complaints today.  REVIEW OF SYSTEMS:   Review of Systems  Constitutional: Negative.  Negative for fever, malaise/fatigue and weight loss.  Respiratory: Positive for shortness of breath. Negative for cough.   Cardiovascular: Negative.  Negative for chest pain and leg swelling.  Gastrointestinal: Negative.  Negative for abdominal pain.  Genitourinary: Negative.   Musculoskeletal: Negative.   Skin: Negative.  Negative for rash.  Neurological: Negative.  Negative for sensory change and weakness.  Psychiatric/Behavioral: Negative.  The patient is not nervous/anxious.     As per HPI. Otherwise, a complete review of systems is negative.  PAST MEDICAL HISTORY: Past Medical History:  Diagnosis Date  . Anemia   . Arthritis   . Complication of anesthesia   . PONV (postoperative nausea and vomiting)     PAST SURGICAL HISTORY: Past Surgical History:  Procedure Laterality Date  . VASECTOMY      FAMILY HISTORY: Family History  Problem Relation Age of Onset  . Diabetes Mother   . Hypertension Mother   . Leukemia Father   . Thyroid disease Sister   .  Liver disease Maternal Grandmother   . Heart Problems Maternal Grandfather   . Diabetes Maternal Grandfather   . Lung cancer Paternal Grandmother   . Prostate cancer Paternal Grandfather     ADVANCED DIRECTIVES (Y/N):  N  HEALTH MAINTENANCE: Social History   Tobacco Use  . Smoking status: Never Smoker  . Smokeless tobacco: Never Used  Substance Use Topics  . Alcohol use: No  . Drug use: No     Colonoscopy:  PAP:  Bone density:  Lipid panel:  No Known Allergies  Current Outpatient Medications  Medication Sig Dispense Refill  . Multiple Vitamin (MULTIVITAMIN) tablet Take 1 tablet by mouth daily.     No current facility-administered medications for this visit.    Facility-Administered Medications Ordered in Other Visits  Medication Dose Route Frequency Provider Last Rate Last Dose  . ipilimumab (YERVOY) 280 mg in sodium chloride 0.9 % 100 mL chemo infusion  3 mg/kg (Treatment Plan Recorded) Intravenous Once Lloyd Huger, MD 104 mL/hr at 09/02/17 1235 280 mg at 09/02/17 1235    OBJECTIVE: There were no vitals filed for this visit.   Body mass index is 28.55 kg/m.    ECOG FS:0 - Asymptomatic  General: Well-developed, well-nourished, no acute distress. Eyes: Pink conjunctiva, anicteric sclera. Lungs: Clear to auscultation bilaterally. Heart: Regular rate and rhythm. No rubs, murmurs, or gallops. Abdomen: Soft, nontender, nondistended. No organomegaly noted, normoactive bowel sounds. Musculoskeletal: No edema, cyanosis, or clubbing. Neuro: Alert, answering all questions appropriately. Cranial nerves grossly intact. Skin: No rashes or petechiae noted. Psych: Normal affect.   LAB RESULTS:  Lab Results  Component  Value Date   NA 138 09/02/2017   K 4.0 09/02/2017   CL 106 09/02/2017   CO2 26 09/02/2017   GLUCOSE 104 (H) 09/02/2017   BUN 17 09/02/2017   CREATININE 0.99 09/02/2017   CALCIUM 8.8 (L) 09/02/2017   PROT 7.0 09/02/2017   ALBUMIN 3.7 09/02/2017     AST 23 09/02/2017   ALT 17 09/02/2017   ALKPHOS 67 09/02/2017   BILITOT 0.5 09/02/2017   GFRNONAA >60 09/02/2017   GFRAA >60 09/02/2017    Lab Results  Component Value Date   WBC 5.0 09/02/2017   NEUTROABS 3.6 09/02/2017   HGB 13.3 09/02/2017   HCT 40.3 09/02/2017   MCV 78.3 (L) 09/02/2017   PLT 231 09/02/2017     STUDIES: No results found.  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  PLAN:    1. 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 BRAF mutation is positive, therefore Zelboraf will be an option in the future. MRI the brain is negative. Given his metastatic disease, will proceed with combination immunotherapy using ipilumimab and nivolumab every 3 weeks for 4 cycles and then continue with maintenance nivomumab every 2 weeks until progression of disease. Proceed with cycle 3 of 4 ipilumimab and nivolumab today. Return to clinic in 3 weeks for consideration of cycle 4. 2.  Dyspnea on exertion: Monitor.  Approximately 30 minutes was spent in discussion of which greater than 50% was consultation.  Patient expressed understanding and was in agreement with this plan. He also understands that He can call clinic at any time with any questions, concerns, or complaints.   Cancer Staging Melanoma of skin ([place]) Staging form: Melanoma of the Skin, AJCC 8th Edition - Clinical stage from 07/15/2017: Stage IV (cTX, cN1, cM1c(0)) - Signed by Lloyd Huger, MD on 07/15/2017   Lloyd Huger, MD   09/02/2017 1:11 PM

## 2017-09-02 ENCOUNTER — Inpatient Hospital Stay: Payer: Medicare Other | Attending: Oncology

## 2017-09-02 ENCOUNTER — Other Ambulatory Visit: Payer: Self-pay

## 2017-09-02 ENCOUNTER — Inpatient Hospital Stay: Payer: Medicare Other

## 2017-09-02 ENCOUNTER — Inpatient Hospital Stay (HOSPITAL_BASED_OUTPATIENT_CLINIC_OR_DEPARTMENT_OTHER): Payer: Medicare Other | Admitting: Oncology

## 2017-09-02 ENCOUNTER — Encounter: Payer: Self-pay | Admitting: Oncology

## 2017-09-02 VITALS — Ht 71.0 in | Wt 204.7 lb

## 2017-09-02 DIAGNOSIS — C7951 Secondary malignant neoplasm of bone: Secondary | ICD-10-CM | POA: Diagnosis not present

## 2017-09-02 DIAGNOSIS — Z8042 Family history of malignant neoplasm of prostate: Secondary | ICD-10-CM | POA: Diagnosis not present

## 2017-09-02 DIAGNOSIS — Z79899 Other long term (current) drug therapy: Secondary | ICD-10-CM | POA: Insufficient documentation

## 2017-09-02 DIAGNOSIS — C439 Malignant melanoma of skin, unspecified: Secondary | ICD-10-CM | POA: Insufficient documentation

## 2017-09-02 DIAGNOSIS — R0602 Shortness of breath: Secondary | ICD-10-CM | POA: Diagnosis not present

## 2017-09-02 DIAGNOSIS — Z801 Family history of malignant neoplasm of trachea, bronchus and lung: Secondary | ICD-10-CM | POA: Insufficient documentation

## 2017-09-02 DIAGNOSIS — Z5112 Encounter for antineoplastic immunotherapy: Secondary | ICD-10-CM | POA: Diagnosis not present

## 2017-09-02 LAB — CBC WITH DIFFERENTIAL/PLATELET
BASOS ABS: 0 10*3/uL (ref 0–0.1)
Basophils Relative: 1 %
EOS PCT: 3 %
Eosinophils Absolute: 0.1 10*3/uL (ref 0–0.7)
HEMATOCRIT: 40.3 % (ref 40.0–52.0)
Hemoglobin: 13.3 g/dL (ref 13.0–18.0)
LYMPHS PCT: 16 %
Lymphs Abs: 0.8 10*3/uL — ABNORMAL LOW (ref 1.0–3.6)
MCH: 25.9 pg — AB (ref 26.0–34.0)
MCHC: 33.1 g/dL (ref 32.0–36.0)
MCV: 78.3 fL — AB (ref 80.0–100.0)
MONO ABS: 0.4 10*3/uL (ref 0.2–1.0)
Monocytes Relative: 9 %
NEUTROS ABS: 3.6 10*3/uL (ref 1.4–6.5)
Neutrophils Relative %: 71 %
PLATELETS: 231 10*3/uL (ref 150–440)
RBC: 5.14 MIL/uL (ref 4.40–5.90)
RDW: 14.8 % — AB (ref 11.5–14.5)
WBC: 5 10*3/uL (ref 3.8–10.6)

## 2017-09-02 LAB — COMPREHENSIVE METABOLIC PANEL
ALBUMIN: 3.7 g/dL (ref 3.5–5.0)
ALT: 17 U/L (ref 17–63)
ANION GAP: 6 (ref 5–15)
AST: 23 U/L (ref 15–41)
Alkaline Phosphatase: 67 U/L (ref 38–126)
BILIRUBIN TOTAL: 0.5 mg/dL (ref 0.3–1.2)
BUN: 17 mg/dL (ref 6–20)
CHLORIDE: 106 mmol/L (ref 101–111)
CO2: 26 mmol/L (ref 22–32)
Calcium: 8.8 mg/dL — ABNORMAL LOW (ref 8.9–10.3)
Creatinine, Ser: 0.99 mg/dL (ref 0.61–1.24)
GFR calc Af Amer: >60 mL/min (ref >60)
GFR calc non Af Amer: >60 mL/min (ref >60)
GLUCOSE: 104 mg/dL — AB (ref 65–99)
POTASSIUM: 4 mmol/L (ref 3.5–5.1)
SODIUM: 138 mmol/L (ref 135–145)
TOTAL PROTEIN: 7 g/dL (ref 6.5–8.1)

## 2017-09-02 MED ORDER — SODIUM CHLORIDE 0.9 % IV SOLN
3.0000 mg/kg | Freq: Once | INTRAVENOUS | Status: AC
  Administered 2017-09-02: 280 mg via INTRAVENOUS
  Filled 2017-09-02: qty 40

## 2017-09-02 MED ORDER — SODIUM CHLORIDE 0.9 % IV SOLN
Freq: Once | INTRAVENOUS | Status: AC
  Administered 2017-09-02: 11:00:00 via INTRAVENOUS
  Filled 2017-09-02: qty 1000

## 2017-09-02 MED ORDER — SODIUM CHLORIDE 0.9 % IV SOLN
100.0000 mg | Freq: Once | INTRAVENOUS | Status: AC
  Administered 2017-09-02: 100 mg via INTRAVENOUS
  Filled 2017-09-02: qty 10

## 2017-09-02 NOTE — Progress Notes (Signed)
Patient here for follow up, patient has been short of breath at times and has experienced flushing on occasion.

## 2017-09-03 LAB — THYROID PANEL WITH TSH
Free Thyroxine Index: 2.3 (ref 1.2–4.9)
T3 UPTAKE RATIO: 32 % (ref 24–39)
T4 TOTAL: 7.2 ug/dL (ref 4.5–12.0)
TSH: 2.94 u[IU]/mL (ref 0.450–4.500)

## 2017-09-20 NOTE — Progress Notes (Signed)
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  INTERVAL HISTORY: Patient returns to clinic today for further evaluation and consideration of cycle 4 of 4 of ipilumumab and nivolumab.  He needs to have intermittent dyspnea on exertion, but otherwise feels well and is asymptomatic. He has no neurologic complaints. He denies any recent fevers or illnesses. He has a good appetite and denies weight loss. He has no chest pain, cough, or hemoptysis. He denies any nausea, vomiting, constipation, or diarrhea. He has no melena or hematochezia. He has no urinary complaints. Patient offers no further specific complaints today.  REVIEW OF SYSTEMS:   Review of Systems  Constitutional: Negative.  Negative for fever, malaise/fatigue and weight loss.  Respiratory: Positive for shortness of breath. Negative for cough.   Cardiovascular: Negative.  Negative for chest pain and leg swelling.  Gastrointestinal: Negative.  Negative for abdominal pain.  Genitourinary: Negative.   Musculoskeletal: Negative.   Skin: Negative.  Negative for rash.  Neurological: Negative.  Negative for sensory change and weakness.  Psychiatric/Behavioral: Negative.  The patient is not nervous/anxious.     As per HPI. Otherwise, a complete review of systems is negative.  PAST MEDICAL HISTORY: Past Medical History:  Diagnosis Date  . Anemia   . Arthritis   . Complication of anesthesia   . PONV (postoperative nausea and vomiting)     PAST SURGICAL HISTORY: Past Surgical History:  Procedure Laterality Date  . AXILLARY LYMPH NODE DISSECTION Left 06/26/2017   Procedure: EXCISION OF LEFT AXILLARY MASS;  Surgeon: Johnathan Hausen, MD;  Location: [facility] ORS;  Service: General;   Laterality: Left;  Marland Kitchen VASECTOMY      FAMILY HISTORY: Family History  Problem Relation Age of Onset  . Diabetes Mother   . Hypertension Mother   . Leukemia Father   . Thyroid disease Sister   . Liver disease Maternal Grandmother   . Heart Problems Maternal Grandfather   . Diabetes Maternal Grandfather   . Lung cancer Paternal Grandmother   . Prostate cancer Paternal Grandfather     ADVANCED DIRECTIVES (Y/N):  N  HEALTH MAINTENANCE: Social History   Tobacco Use  . Smoking status: Never Smoker  . Smokeless tobacco: Never Used  Substance Use Topics  . Alcohol use: No  . Drug use: No     Colonoscopy:  PAP:  Bone density:  Lipid panel:  No Known Allergies  Current Outpatient Medications  Medication Sig Dispense Refill  . Multiple Vitamin (MULTIVITAMIN) tablet Take 1 tablet by mouth daily.     No current facility-administered medications for this visit.     OBJECTIVE: Vitals:   09/23/17 0909  BP: 122/80  Pulse: 62  Resp: 18  Temp: (!) 97.5 F (36.4 C)     Body mass index is 28.22 kg/m.    ECOG FS:0 - Asymptomatic  General: Well-developed, well-nourished, no acute distress. Eyes: Pink conjunctiva, anicteric sclera. Lungs: Clear to auscultation bilaterally. Heart: Regular rate and rhythm. No rubs, murmurs, or gallops. Abdomen: Soft, nontender, nondistended. No organomegaly noted, normoactive bowel sounds. Musculoskeletal: No edema, cyanosis, or clubbing. Neuro: Alert, answering all questions appropriately. Cranial nerves grossly intact. Skin: No rashes or petechiae noted. Psych: Normal affect.   LAB RESULTS:  Lab Results  Component Value Date   NA 137 09/23/2017  K 4.0 09/23/2017   CL 106 09/23/2017   CO2 25 09/23/2017   GLUCOSE 82 09/23/2017   BUN 16 09/23/2017   CREATININE 1.00 09/23/2017   CALCIUM 8.8 (L) 09/23/2017   PROT 7.3 09/23/2017   ALBUMIN 3.8 09/23/2017   AST 23 09/23/2017   ALT 15 (L) 09/23/2017   ALKPHOS 66 09/23/2017   BILITOT  0.6 09/23/2017   GFRNONAA >60 09/23/2017   GFRAA >60 09/23/2017    Lab Results  Component Value Date   WBC 5.5 09/23/2017   NEUTROABS 3.8 09/23/2017   HGB 12.9 (L) 09/23/2017   HCT 39.3 (L) 09/23/2017   MCV 77.8 (L) 09/23/2017   PLT 223 09/23/2017     STUDIES: No results found.  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  PLAN:    1. 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 BRAF mutation is positive, therefore Zelboraf will be an option in the future. MRI the brain is negative. Given his metastatic disease, will proceed with combination immunotherapy using ipilumimab and nivolumab every 3 weeks for 4 cycles and then continue with maintenance nivomumab every 2 weeks until progression of disease. Proceed with cycle 4 of 4 ipilumimab and nivolumab today. Return to clinic in 3 weeks for cycle 1 of maintenance nivolumab.  We will reimage with PET scan prior to his next infusion.  2.  Dyspnea on exertion: Monitor.  Approximately 30 minutes was spent in discussion of which greater than 50% was consultation.  Patient expressed understanding and was in agreement with this plan. He also understands that He can call clinic at any time with any questions, concerns, or complaints.   Cancer Staging Melanoma of skin ([place]) Staging form: Melanoma of the Skin, AJCC 8th Edition - Clinical stage from 07/15/2017: Stage IV (cTX, cN1, cM1c(0)) - Signed by Lloyd Huger, MD on 07/15/2017   Lloyd Huger, MD   09/23/2017 2:09 PM

## 2017-09-23 ENCOUNTER — Inpatient Hospital Stay: Payer: Medicare Other | Attending: Oncology

## 2017-09-23 ENCOUNTER — Inpatient Hospital Stay: Payer: Medicare Other

## 2017-09-23 ENCOUNTER — Inpatient Hospital Stay (HOSPITAL_BASED_OUTPATIENT_CLINIC_OR_DEPARTMENT_OTHER): Payer: Medicare Other | Admitting: Oncology

## 2017-09-23 VITALS — BP 122/80 | HR 62 | Temp 97.5°F | Resp 18 | Wt 202.3 lb

## 2017-09-23 DIAGNOSIS — C7951 Secondary malignant neoplasm of bone: Secondary | ICD-10-CM | POA: Diagnosis not present

## 2017-09-23 DIAGNOSIS — D649 Anemia, unspecified: Secondary | ICD-10-CM | POA: Insufficient documentation

## 2017-09-23 DIAGNOSIS — C439 Malignant melanoma of skin, unspecified: Secondary | ICD-10-CM

## 2017-09-23 DIAGNOSIS — M129 Arthropathy, unspecified: Secondary | ICD-10-CM | POA: Insufficient documentation

## 2017-09-23 DIAGNOSIS — Z5112 Encounter for antineoplastic immunotherapy: Secondary | ICD-10-CM | POA: Insufficient documentation

## 2017-09-23 DIAGNOSIS — Z801 Family history of malignant neoplasm of trachea, bronchus and lung: Secondary | ICD-10-CM

## 2017-09-23 DIAGNOSIS — Z8042 Family history of malignant neoplasm of prostate: Secondary | ICD-10-CM | POA: Insufficient documentation

## 2017-09-23 DIAGNOSIS — R0602 Shortness of breath: Secondary | ICD-10-CM | POA: Diagnosis not present

## 2017-09-23 LAB — CBC WITH DIFFERENTIAL/PLATELET
BASOS ABS: 0.1 10*3/uL (ref 0–0.1)
Basophils Relative: 1 %
Eosinophils Absolute: 0.2 10*3/uL (ref 0–0.7)
Eosinophils Relative: 3 %
HEMATOCRIT: 39.3 % — AB (ref 40.0–52.0)
HEMOGLOBIN: 12.9 g/dL — AB (ref 13.0–18.0)
LYMPHS PCT: 17 %
Lymphs Abs: 0.9 10*3/uL — ABNORMAL LOW (ref 1.0–3.6)
MCH: 25.6 pg — ABNORMAL LOW (ref 26.0–34.0)
MCHC: 33 g/dL (ref 32.0–36.0)
MCV: 77.8 fL — AB (ref 80.0–100.0)
MONOS PCT: 10 %
Monocytes Absolute: 0.5 10*3/uL (ref 0.2–1.0)
NEUTROS PCT: 69 %
Neutro Abs: 3.8 10*3/uL (ref 1.4–6.5)
Platelets: 223 10*3/uL (ref 150–440)
RBC: 5.05 MIL/uL (ref 4.40–5.90)
RDW: 15.7 % — AB (ref 11.5–14.5)
WBC: 5.5 10*3/uL (ref 3.8–10.6)

## 2017-09-23 LAB — COMPREHENSIVE METABOLIC PANEL
ALBUMIN: 3.8 g/dL (ref 3.5–5.0)
ALT: 15 U/L — ABNORMAL LOW (ref 17–63)
ANION GAP: 6 (ref 5–15)
AST: 23 U/L (ref 15–41)
Alkaline Phosphatase: 66 U/L (ref 38–126)
BUN: 16 mg/dL (ref 6–20)
CO2: 25 mmol/L (ref 22–32)
Calcium: 8.8 mg/dL — ABNORMAL LOW (ref 8.9–10.3)
Chloride: 106 mmol/L (ref 101–111)
Creatinine, Ser: 1 mg/dL (ref 0.61–1.24)
GFR calc Af Amer: >60 mL/min (ref >60)
GFR calc non Af Amer: >60 mL/min (ref >60)
GLUCOSE: 82 mg/dL (ref 65–99)
POTASSIUM: 4 mmol/L (ref 3.5–5.1)
SODIUM: 137 mmol/L (ref 135–145)
Total Bilirubin: 0.6 mg/dL (ref 0.3–1.2)
Total Protein: 7.3 g/dL (ref 6.5–8.1)

## 2017-09-23 MED ORDER — SODIUM CHLORIDE 0.9 % IV SOLN
100.0000 mg | Freq: Once | INTRAVENOUS | Status: AC
  Administered 2017-09-23: 100 mg via INTRAVENOUS
  Filled 2017-09-23: qty 10

## 2017-09-23 MED ORDER — SODIUM CHLORIDE 0.9 % IV SOLN
Freq: Once | INTRAVENOUS | Status: AC
  Administered 2017-09-23: 10:00:00 via INTRAVENOUS
  Filled 2017-09-23: qty 1000

## 2017-09-23 MED ORDER — IPILIMUMAB CHEMO INJECTION 200 MG/40ML
3.0000 mg/kg | Freq: Once | INTRAVENOUS | Status: AC
  Administered 2017-09-23: 280 mg via INTRAVENOUS
  Filled 2017-09-23: qty 40

## 2017-09-24 LAB — THYROID PANEL WITH TSH
FREE THYROXINE INDEX: 2.5 (ref 1.2–4.9)
T3 UPTAKE RATIO: 32 % (ref 24–39)
T4, Total: 7.8 ug/dL (ref 4.5–12.0)
TSH: 1.02 u[IU]/mL (ref 0.450–4.500)

## 2017-10-09 ENCOUNTER — Ambulatory Visit
Admission: RE | Admit: 2017-10-09 | Discharge: 2017-10-09 | Disposition: A | Discharge status: Home / Self Care | Payer: Medicare Other | Source: Ambulatory Visit | Attending: Oncology | Admitting: Oncology

## 2017-10-09 DIAGNOSIS — J9811 Atelectasis: Secondary | ICD-10-CM

## 2017-10-09 DIAGNOSIS — C439 Malignant melanoma of skin, unspecified: Secondary | ICD-10-CM

## 2017-10-09 DIAGNOSIS — E860 Dehydration: Secondary | ICD-10-CM | POA: Diagnosis present

## 2017-10-09 DIAGNOSIS — Z85820 Personal history of malignant melanoma of skin: Secondary | ICD-10-CM

## 2017-10-09 DIAGNOSIS — E876 Hypokalemia: Secondary | ICD-10-CM | POA: Diagnosis present

## 2017-10-09 DIAGNOSIS — K567 Ileus, unspecified: Principal | ICD-10-CM | POA: Diagnosis present

## 2017-10-09 DIAGNOSIS — C799 Secondary malignant neoplasm of unspecified site: Secondary | ICD-10-CM | POA: Diagnosis present

## 2017-10-09 DIAGNOSIS — M1712 Unilateral primary osteoarthritis, left knee: Secondary | ICD-10-CM

## 2017-10-09 LAB — GLUCOSE, CAPILLARY: GLUCOSE-CAPILLARY: 83 mg/dL (ref 65–99)

## 2017-10-09 MED ORDER — FLUDEOXYGLUCOSE F - 18 (FDG) INJECTION
12.0000 | Freq: Once | INTRAVENOUS | Status: AC | PRN
  Administered 2017-10-09: 12.63 via INTRAVENOUS

## 2017-10-10 ENCOUNTER — Observation Stay: Payer: Medicare Other

## 2017-10-10 ENCOUNTER — Inpatient Hospital Stay
Admission: EM | Admit: 2017-10-10 | Discharge: 2017-10-26 | DRG: 357 | Disposition: A | Discharge status: Home / Self Care | Payer: Medicare Other | Attending: Internal Medicine | Admitting: Internal Medicine

## 2017-10-10 ENCOUNTER — Encounter: Payer: Self-pay | Admitting: Emergency Medicine

## 2017-10-10 ENCOUNTER — Other Ambulatory Visit: Payer: Self-pay

## 2017-10-10 ENCOUNTER — Emergency Department: Payer: Medicare Other

## 2017-10-10 DIAGNOSIS — R1110 Vomiting, unspecified: Secondary | ICD-10-CM

## 2017-10-10 DIAGNOSIS — K567 Ileus, unspecified: Secondary | ICD-10-CM | POA: Diagnosis not present

## 2017-10-10 DIAGNOSIS — R198 Other specified symptoms and signs involving the digestive system and abdomen: Secondary | ICD-10-CM

## 2017-10-10 DIAGNOSIS — K5900 Constipation, unspecified: Secondary | ICD-10-CM | POA: Diagnosis present

## 2017-10-10 DIAGNOSIS — Z978 Presence of other specified devices: Secondary | ICD-10-CM

## 2017-10-10 DIAGNOSIS — R112 Nausea with vomiting, unspecified: Secondary | ICD-10-CM

## 2017-10-10 LAB — COMPREHENSIVE METABOLIC PANEL
ALT: 22 U/L (ref 17–63)
AST: 25 U/L (ref 15–41)
Albumin: 4.3 g/dL (ref 3.5–5.0)
Alkaline Phosphatase: 74 U/L (ref 38–126)
Anion gap: 10 (ref 5–15)
BILIRUBIN TOTAL: 0.9 mg/dL (ref 0.3–1.2)
BUN: 16 mg/dL (ref 6–20)
CO2: 25 mmol/L (ref 22–32)
CREATININE: 0.77 mg/dL (ref 0.61–1.24)
Calcium: 9.6 mg/dL (ref 8.9–10.3)
Chloride: 104 mmol/L (ref 101–111)
Glucose, Bld: 117 mg/dL — ABNORMAL HIGH (ref 65–99)
POTASSIUM: 3.5 mmol/L (ref 3.5–5.1)
Sodium: 139 mmol/L (ref 135–145)
TOTAL PROTEIN: 7.7 g/dL (ref 6.5–8.1)

## 2017-10-10 LAB — URINALYSIS, COMPLETE (UACMP) WITH MICROSCOPIC
BILIRUBIN URINE: NEGATIVE
Bacteria, UA: NONE SEEN
GLUCOSE, UA: NEGATIVE mg/dL
Ketones, ur: 20 mg/dL — AB
LEUKOCYTES UA: NEGATIVE
NITRITE: NEGATIVE
PH: 5 (ref 5.0–8.0)
Protein, ur: 100 mg/dL — AB
SPECIFIC GRAVITY, URINE: 1.023 (ref 1.005–1.030)
Squamous Epithelial / LPF: NONE SEEN

## 2017-10-10 LAB — CBC
HCT: 41.1 % (ref 40.0–52.0)
HEMATOCRIT: 43.2 % (ref 40.0–52.0)
HEMOGLOBIN: 13.4 g/dL (ref 13.0–18.0)
Hemoglobin: 14 g/dL (ref 13.0–18.0)
MCH: 25.1 pg — ABNORMAL LOW (ref 26.0–34.0)
MCH: 25.5 pg — ABNORMAL LOW (ref 26.0–34.0)
MCHC: 32.5 g/dL (ref 32.0–36.0)
MCHC: 32.7 g/dL (ref 32.0–36.0)
MCV: 77.3 fL — ABNORMAL LOW (ref 80.0–100.0)
MCV: 78.2 fL — ABNORMAL LOW (ref 80.0–100.0)
PLATELETS: 249 10*3/uL (ref 150–440)
Platelets: 220 10*3/uL (ref 150–440)
RBC: 5.6 MIL/uL (ref 4.40–5.90)
RBC: 5.26 MIL/uL (ref 4.40–5.90)
RDW: 15.8 % — AB (ref 11.5–14.5)
RDW: 15.9 % — ABNORMAL HIGH (ref 11.5–14.5)
WBC: 7.8 10*3/uL (ref 3.8–10.6)
WBC: 6.5 10*3/uL (ref 3.8–10.6)

## 2017-10-10 LAB — CREATININE, SERUM
CREATININE: 0.94 mg/dL (ref 0.61–1.24)
GFR calc Af Amer: >60 mL/min (ref >60)

## 2017-10-10 LAB — LIPASE, BLOOD: Lipase: 33 U/L (ref 11–51)

## 2017-10-10 MED ORDER — SODIUM CHLORIDE 0.9% FLUSH
3.0000 mL | Freq: Two times a day (BID) | INTRAVENOUS | Status: DC
  Administered 2017-10-11 – 2017-10-26 (×19): 3 mL via INTRAVENOUS

## 2017-10-10 MED ORDER — ONDANSETRON HCL 4 MG PO TABS
4.0000 mg | ORAL_TABLET | Freq: Four times a day (QID) | ORAL | Status: DC | PRN
  Administered 2017-10-25: 11:00:00 4 mg via ORAL
  Filled 2017-10-10: qty 1

## 2017-10-10 MED ORDER — LACTULOSE 10 GM/15ML PO SOLN
30.0000 g | Freq: Two times a day (BID) | ORAL | Status: DC
  Administered 2017-10-10 – 2017-10-14 (×7): 30 g via ORAL
  Filled 2017-10-10 (×7): qty 60

## 2017-10-10 MED ORDER — HYDROCODONE-ACETAMINOPHEN 5-325 MG PO TABS: 1.0000 | ORAL_TABLET | ORAL | Status: DC | PRN

## 2017-10-10 MED ORDER — ADULT MULTIVITAMIN W/MINERALS CH
1.0000 | ORAL_TABLET | Freq: Every day | ORAL | Status: DC
  Administered 2017-10-11 – 2017-10-13 (×3): 1 via ORAL
  Filled 2017-10-10 (×3): qty 1

## 2017-10-10 MED ORDER — ACETAMINOPHEN 650 MG RE SUPP
650.0000 mg | Freq: Four times a day (QID) | RECTAL | Status: DC | PRN
  Administered 2017-10-15 – 2017-10-19 (×5): 650 mg via RECTAL
  Filled 2017-10-10 (×5): qty 1

## 2017-10-10 MED ORDER — ACETAMINOPHEN 325 MG PO TABS
650.0000 mg | ORAL_TABLET | Freq: Four times a day (QID) | ORAL | Status: DC | PRN
  Filled 2017-10-10: qty 2

## 2017-10-10 MED ORDER — ONDANSETRON HCL 4 MG/2ML IJ SOLN
4.0000 mg | Freq: Four times a day (QID) | INTRAMUSCULAR | Status: DC | PRN
  Administered 2017-10-14 – 2017-10-24 (×21): 4 mg via INTRAVENOUS
  Filled 2017-10-10 (×20): qty 2

## 2017-10-10 MED ORDER — ENOXAPARIN SODIUM 40 MG/0.4ML ~~LOC~~ SOLN
40.0000 mg | SUBCUTANEOUS | Status: DC
  Administered 2017-10-10 – 2017-10-25 (×15): 40 mg via SUBCUTANEOUS
  Filled 2017-10-10 (×16): qty 0.4

## 2017-10-10 MED ORDER — FLEET ENEMA 7-19 GM/118ML RE ENEM
1.0000 | ENEMA | Freq: Once | RECTAL | Status: AC
  Administered 2017-10-10: 1 via RECTAL

## 2017-10-10 MED ORDER — LACTATED RINGERS IV SOLN
INTRAVENOUS | Status: AC
  Administered 2017-10-10: 21:00:00 via INTRAVENOUS

## 2017-10-10 MED ORDER — SODIUM CHLORIDE 0.9 % IV SOLN
Freq: Once | INTRAVENOUS | Status: AC
  Administered 2017-10-10: 16:00:00 via INTRAVENOUS

## 2017-10-10 NOTE — H&P (Signed)
[place] [facility] [place] NAME: Louis Davies    MR#:  [id number]  DATE OF BIRTH:  1951/10/08  DATE OF ADMISSION:  10/10/2017  PRIMARY CARE PHYSICIAN: Care, [facility]   REQUESTING/REFERRING PHYSICIAN:   CHIEF COMPLAINT:   Chief Complaint  Patient presents with  . Constipation  . Emesis    HISTORY OF PRESENT ILLNESS: Louis Davies  is a 66 y.o. male with a known history of metastatic melanoma, anemia, osteoarthritis presenting with constipation since Sunday associated with nausea/vomiting, abdominal distention, ER workup noted for ileus versus partial small bowel obstruction/very large colonic stool burden on x-ray, urinalysis noted for ketones, patient evaluated in the emergency room, resting comfortably in bed, significant other at the bedside, patient now been admitted for acute severe constipation with nausea/emesis, mild dehydration.  PAST MEDICAL HISTORY:   Past Medical History:  Diagnosis Date  . Anemia   . Arthritis   . Complication of anesthesia   . PONV (postoperative nausea and vomiting)     PAST SURGICAL HISTORY:  Past Surgical History:  Procedure Laterality Date  . AXILLARY LYMPH NODE DISSECTION Left 06/26/2017   Procedure: EXCISION OF LEFT AXILLARY MASS;  Surgeon: Johnathan Hausen, MD;  Location: [facility] ORS;  Service: General;  Laterality: Left;  Marland Kitchen VASECTOMY      SOCIAL HISTORY:  Social History   Tobacco Use  . Smoking status: Never Smoker  . Smokeless tobacco: Never Used  Substance Use Topics  . Alcohol use: No    FAMILY HISTORY:  Family History  Problem Relation Age of Onset  . Diabetes Mother   . Hypertension Mother   . Leukemia Father   . Thyroid disease Sister   . Liver disease Maternal Grandmother   . Heart Problems Maternal Grandfather   . Diabetes Maternal Grandfather   . Lung cancer Paternal Grandmother   . Prostate cancer Paternal Grandfather     DRUG ALLERGIES: No Known Allergies  REVIEW OF  SYSTEMS:   CONSTITUTIONAL: No fever, +fatigue/weakness.  EYES: No blurred or double vision.  EARS, NOSE, AND THROAT: No tinnitus or ear pain.  RESPIRATORY: No cough, shortness of breath, wheezing or hemoptysis.  CARDIOVASCULAR: No chest pain, orthopnea, edema.  GASTROINTESTINAL: + nausea/vomiting/constipation, no diarrhea or abdominal pain.  GENITOURINARY: No dysuria, hematuria.  ENDOCRINE: No polyuria, nocturia,  HEMATOLOGY: No anemia, easy bruising or bleeding SKIN: No rash or lesion. MUSCULOSKELETAL: No joint pain or arthritis.   NEUROLOGIC: No tingling, numbness, weakness.  PSYCHIATRY: No anxiety or depression.   MEDICATIONS AT HOME:  Prior to Admission medications   Medication Sig Start Date End Date Taking? Authorizing Provider  Multiple Vitamin (MULTIVITAMIN) tablet Take 1 tablet by mouth daily.    [provider]      PHYSICAL EXAMINATION:   VITAL SIGNS: Blood pressure (!) 135/91, pulse 81, temperature 97.6 F (36.4 C), temperature source Oral, resp. rate 16, weight 91.6 kg (202 lb), SpO2 98 %.  GENERAL:  66 y.o.-year-old patient lying in the bed with no acute distress.  EYES: Pupils equal, round, reactive to light and accommodation. No scleral icterus. Extraocular muscles intact.  HEENT: Head atraumatic, normocephalic. Oropharynx and nasopharynx clear.  NECK:  Supple, no jugular venous distention. No thyroid enlargement, no tenderness.  LUNGS: Normal breath sounds bilaterally, no wheezing, rales,rhonchi or crepitation. No use of accessory muscles of respiration.  CARDIOVASCULAR: S1, S2 normal. No murmurs, rubs, or gallops.  ABDOMEN: Soft, distended, nontender, tympanitic to percussion, hypoactive bowel sounds  EXTREMITIES: No  pedal edema, cyanosis, or clubbing.  NEUROLOGIC: Cranial nerves II through XII are intact. MAES. Gait not checked.  PSYCHIATRIC: The patient is alert and oriented x 3.  SKIN: No obvious rash, lesion, or ulcer.   LABORATORY PANEL:    CBC Recent Labs  Lab 10/10/17 1114  WBC 7.8  HGB 14.0  HCT 43.2  PLT 249  MCV 77.3*  MCH 25.1*  MCHC 32.5  RDW 15.8*   ------------------------------------------------------------------------------------------------------------------  Chemistries  Recent Labs  Lab 10/10/17 1114  NA 139  K 3.5  CL 104  CO2 25  GLUCOSE 117*  BUN 16  CREATININE 0.77  CALCIUM 9.6  AST 25  ALT 22  ALKPHOS 74  BILITOT 0.9   ------------------------------------------------------------------------------------------------------------------ estimated creatinine clearance is 102.3 mL/min (by C-G formula based on SCr of 0.77 mg/dL). ------------------------------------------------------------------------------------------------------------------ No results for input(s): TSH, T4TOTAL, T3FREE, THYROIDAB in the last 72 hours.  Invalid input(s): FREET3   Coagulation profile No results for input(s): INR, PROTIME in the last 168 hours. ------------------------------------------------------------------------------------------------------------------- No results for input(s): DDIMER in the last 72 hours. -------------------------------------------------------------------------------------------------------------------  Cardiac Enzymes No results for input(s): CKMB, TROPONINI, MYOGLOBIN in the last 168 hours.  Invalid input(s): CK ------------------------------------------------------------------------------------------------------------------ Invalid input(s): POCBNP  ---------------------------------------------------------------------------------------------------------------  Urinalysis    Component Value Date/Time   COLORURINE YELLOW (A) 10/10/2017 1114   APPEARANCEUR HAZY (A) 10/10/2017 1114   LABSPEC 1.023 10/10/2017 1114   PHURINE 5.0 10/10/2017 1114   GLUCOSEU NEGATIVE 10/10/2017 1114   HGBUR SMALL (A) 10/10/2017 1114   BILIRUBINUR NEGATIVE 10/10/2017 1114   KETONESUR 20 (A)  10/10/2017 1114   PROTEINUR 100 (A) 10/10/2017 1114   NITRITE NEGATIVE 10/10/2017 1114   LEUKOCYTESUR NEGATIVE 10/10/2017 1114     RADIOLOGY: Dg Abd 1 View  Result Date: 10/10/2017 CLINICAL DATA:  66 year old presenting with no bowel movement in the past 6 days and acute onset of vomiting that began last night, associated with abdominal distention. Patient currently undergoing immunotherapy for metastatic melanoma. EXAM: ABDOMEN - 1 VIEW COMPARISON:  PET-CT yesterday and 07/15/2017. FINDINGS: Multiple dilated loops of small bowel in the upper abdomen as noted on the PET-CT yesterday. Very large colonic stool burden. Gas and stool throughout the upper normal caliber colon from cecum to rectum. No suggestion of free air on the supine image. IMPRESSION: 1. Stable small bowel distention since yesterday's PET-CT, ileus versus early partial small bowel obstruction. Serial follow-up abdominal x-rays may be helpful. 2. Very large colonic stool burden. Electronically Signed   By: Evangeline Dakin M.D.   On: 10/10/2017 11:50   Nm Pet Image Restage (ps) Whole Body  Result Date: 10/09/2017 CLINICAL DATA:  Subsequent treatment strategy for cutaneous melanoma. Immunotherapy December 2018. EXAM: NUCLEAR MEDICINE PET WHOLE BODY TECHNIQUE: 12.6 mCi F-18 FDG was injected intravenously. Full-ring PET imaging was performed from the vertex to the feet after the radiotracer. CT data was obtained and used for attenuation correction and anatomic localization. FASTING BLOOD GLUCOSE:  Value: 83 mg/dl COMPARISON:  07/15/2017 FINDINGS: HEAD/NECK No hypermetabolic activity in the scalp. No hypermetabolic cervical lymph nodes. Right maxillary molar periapical lucency with adjacent mucoperiosteal thickening in the right maxillary sinus. CHEST Dominant left axillary mass measures 9.1 by 5.8 cm on image 117/4 with maximum SUV 21.4 (formerly 16.5). The smaller adjacent lymph nodes demonstrate only very weak metabolic activity. There  is a fluid density collection extending from the center of the axillary process towards the skin surface. A right supraclavicular node measuring 1.1 cm in short axis on image 91/4 previously  measured 0.6 cm and currently has a maximum SUV of 3.5 (formerly not appreciably metabolic). Hypermetabolic right paratracheal, subcarinal, bilateral hilar, and bilateral infrahilar lymph nodes are present. A index subcarinal node 1.2 cm in short axis on image 123/4 (formerly 0.6 cm) with maximum SUV 6.7 (formerly 4.0). Scarring in both lower lobes, with a bandlike nodular region of volume loss in the left lower lobe on image 147/4 with low-grade associated metabolic activity, probably a all from atelectasis. Mild bilateral airway thickening. ABDOMEN/PELVIS No abnormal hypermetabolic activity within the liver, pancreas, adrenal glands, or spleen. No hypermetabolic lymph nodes in the abdomen or pelvis. A small hypodense lesion in the vicinity of the falciform ligament is not appreciably hypermetabolic and appear stable from prior, possibly focal fatty infiltration of the liver. Dependent density in the gallbladder favor sludge. Rectal air- fluid level favoring diarrheal process. Borderline dilated loops of pelvic small bowel without a well-defined transition point. Prior accentuated activity in the anorectal junction has resolved. SKELETON Expansile left iliac bone lesion with similar size and morphology compared the prior exam, 2.0 cm in long axis, maximum SUV 2.5 (previously 3.1). 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 EXTREMITIES No abnormal hypermetabolic activity in the lower extremities. IMPRESSION: 1. The dominant left axillary mass is essentially stable in size by my measurements, and has a maximum SUV of 21.4 (formerly 16.5). Also the mediastinal and hilar/infrahilar lymph nodes in the chest demonstrate mild enlargement and moderate increased in standard uptake value. Possibilities include mild progression of versus  immunotherapy-related pseudoprogression, and surveillance is recommended. As before, left axillary mass has a cutaneous extension inferiorly and may possibly be draining to the skin. 2. Roughly stable appearance of the expansile left iliac bone lesion, with low-grade metabolic activity. 3. No additional involvement identified. 4. Borderline dilated small bowel, significance uncertain. No definite hypermetabolic activity within or along the small bowel to further suggest metastatic disease to the bowel. There is also an air- fluid level in the rectum compatible with diarrheal process. 5. Increased atelectasis along the left lung base. 6. Other imaging findings of potential clinical significance: Periapical lucency along a right maxillary molar. 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 Airway thickening is present, suggesting bronchitis or reactive airways disease. Electronically Signed   By: Van Clines M.D.   On: 10/09/2017 15:07    EKG: No orders found for this or any previous visit.  IMPRESSION AND PLAN: 1 acute severe constipation Referred to the observation unit, IV fluids for rehydration, fleets enema x1 followed by tap water enemas, lactulose twice daily, strict I&O monitoring, clear liquid diet for now, and continue close medical monitoring  2 acute nausea/emesis Secondary to above Antiemetics as needed  3 acute dehydration Secondary to above IV fluids for rehydration  4 chronic melanoma with metastatic disease Followed by oncology-we will need to continue immunotherapy status post discharge  Full code Condition stable Prognosis fair DVT prophylaxis with Lovenox subcu Disposition home in 1-2 days barring any complications   All the records are reviewed and case discussed with ED provider. Management plans discussed with the patient, family and they are in agreement.  CODE STATUS: Code Status History    This patient does not have a recorded code status. Please follow your  organizational policy for patients in this situation.    Advance Directive Documentation     Most Recent Value  Type of Advance Directive  Living will, Healthcare Power of Attorney  Pre-existing out of facility DNR order (yellow form or pink MOST form)  No  data  "MOST" Form in Place?  No data       TOTAL TIME TAKING CARE OF THIS PATIENT: 40 minutes.    Avel Peace Demarri Elie M.D on 10/10/2017   Between 7am to 6pm - Pager - [phone]  After 6pm go to [email or web address] - password [email or web address] [place] [facility]  Office  [phone]  CC: Primary care physician; Care, [facility]   Note: This dictation was prepared with Dragon dictation along with smaller phrase technology. Any transcriptional errors that result from this process are unintentional.

## 2017-10-10 NOTE — ED Notes (Addendum)
Pt states hasn't had any nausea or vomiting while waiting in ED. States hasn't had a BM since Sunday. C/o of abdominal distention. C/o of lower abd pain and back pain. States it's a pressure. Has taken dulcolax x 1.5 days. States has been eating beans and honey to induce BM. States passing gas only when vomiting. States he is passing water out of rectum.   Doing immunotherapy. PET scan performed yesterday.   Pt alert, oriented. States hasn't "felt well" for about a week.

## 2017-10-10 NOTE — ED Provider Notes (Signed)
[facility] Emergency Department Provider Note  ____________________________________________   I have reviewed the triage vital signs and the nursing notes.   HISTORY  Chief Complaint Constipation and Emesis   History limited by: Not Limited   HPI Louis Davies is a 66 y.o. male who presents to the emergency department today because of concern for constipation.  DURATION:1 week TIMING: constant SEVERITY: severe QUALITY: has not passed solid stool CONTEXT: patient with history of melanoma on immunotherapy. States for the past week has not had a good bowel movement. Has only passed small amounts of stool and some watery substance. The patient denies history of constipation stating most he would go between bowel movements in the past would be 2 days.  Denies any abdominal surgery. MODIFYING FACTORS: tried herbal laxative without relief ASSOCIATED SYMPTOMS: has had abdominal distention and discomfort. Has had vomiting.  Per medical record review patient has a history of melanoma.   Past Medical History:  Diagnosis Date  . Anemia   . Arthritis   . Complication of anesthesia   . PONV (postoperative nausea and vomiting)     Patient Active Problem List   Diagnosis Date Noted  . Melanoma of skin ([place]) 07/07/2017  . Axillary mass, left 06/26/2017    Past Surgical History:  Procedure Laterality Date  . AXILLARY LYMPH NODE DISSECTION Left 06/26/2017   Procedure: EXCISION OF LEFT AXILLARY MASS;  Surgeon: Johnathan Hausen, MD;  Location: [facility] ORS;  Service: General;  Laterality: Left;  Marland Kitchen VASECTOMY      Prior to Admission medications   Medication Sig Start Date End Date Taking? Authorizing Provider  Multiple Vitamin (MULTIVITAMIN) tablet Take 1 tablet by mouth daily.    [provider]    Allergies Patient has no known allergies.  Family History  Problem Relation Age of Onset  . Diabetes Mother   . Hypertension Mother   . Leukemia Father    . Thyroid disease Sister   . Liver disease Maternal Grandmother   . Heart Problems Maternal Grandfather   . Diabetes Maternal Grandfather   . Lung cancer Paternal Grandmother   . Prostate cancer Paternal Grandfather     Social History Social History   Tobacco Use  . Smoking status: Never Smoker  . Smokeless tobacco: Never Used  Substance Use Topics  . Alcohol use: No  . Drug use: No    Review of Systems Constitutional: No fever/chills Eyes: No visual changes. ENT: No sore throat. Cardiovascular: Denies chest pain. Respiratory: Denies shortness of breath. Gastrointestinal: Positive for abdominal discomfort and distention. Positive for vomiting.  Genitourinary: Negative for dysuria. Musculoskeletal: Negative for back pain. Skin: Negative for rash. Neurological: Negative for headaches, focal weakness or numbness.  ____________________________________________   PHYSICAL EXAM:  VITAL SIGNS: ED Triage Vitals  Enc Vitals Group     BP 10/10/17 1111 122/80     Pulse Rate 10/10/17 1111 70     Resp 10/10/17 1111 16     Temp 10/10/17 1111 97.6 F (36.4 C)     Temp Source 10/10/17 1111 Oral     SpO2 10/10/17 1111 96 %     Weight 10/10/17 1112 202 lb (91.6 kg)     Height --      Head Circumference --      Peak Flow --      Pain Score 10/10/17 1111 7   Constitutional: Alert and oriented. Well appearing and in no distress. Eyes: Conjunctivae are normal.  ENT   Head:  Normocephalic and atraumatic.   Nose: No congestion/rhinnorhea.   Mouth/Throat: Mucous membranes are moist.   Neck: No stridor. Hematological/Lymphatic/Immunilogical: No cervical lymphadenopathy. Cardiovascular: Normal rate, regular rhythm.  No murmurs, rubs, or gallops.  Respiratory: Normal respiratory effort without tachypnea nor retractions. Breath sounds are clear and equal bilaterally. No wheezes/rales/rhonchi. Gastrointestinal: Soft and minimally tender. Distended. Tympanitic. No fecal  impaction on rectal exam. Genitourinary: Deferred Musculoskeletal: Normal range of motion in all extremities. No lower extremity edema. Neurologic:  Normal speech and language. No gross focal neurologic deficits are appreciated.  Skin:  Skin is warm, dry and intact. No rash noted. Psychiatric: Mood and affect are normal. Speech and behavior are normal. Patient exhibits appropriate insight and judgment.  ____________________________________________    LABS (pertinent positives/negatives)  Lipase 33 CMP wnl except glu 117 CBC wbc 7.8, hgb 14.0, plt 249 UA not consistent with infection  ____________________________________________   EKG  None  ____________________________________________    RADIOLOGY  Abd x-ray Small bowel distention, ileus vs early partial sbo  ____________________________________________   PROCEDURES  Procedures  ____________________________________________   INITIAL IMPRESSION / ASSESSMENT AND PLAN / ED COURSE  Pertinent labs & imaging results that were available during my care of the patient were reviewed by me and considered in my medical decision making (see chart for details).  Patient presented to the emergency department today because of concern for constipation for 1 week. On exam patient's abdomen is distended, tympanitic and minimally tender. Concern for SBO v illeus v infection v constipation amongst other etiologies. X-ray does show some dilated loops of small bowel. Discussed with Dr. Rosana Hoes with surgery who reviewed imaging and at this time does not think finding consistent with SBO or partial SBO. At this point think likely ileus. Given distention, discomfort and vomiting will plan on NG tube, bowel rest and admission. Discussed findings and plan with patient and family.   ____________________________________________   FINAL CLINICAL IMPRESSION(S) / ED DIAGNOSES  Final diagnoses:  Constipation  Ileus ([place])  Vomiting,  intractability of vomiting not specified, presence of nausea not specified, unspecified vomiting type     Note: This dictation was prepared with Dragon dictation. Any transcriptional errors that result from this process are unintentional     Nance Pear, MD 10/11/17 [id number]

## 2017-10-10 NOTE — ED Triage Notes (Signed)
Pt to ED via POV c/o constipation and emesis. Pt states that he has not had a bowel movement since Sunday. Pt states that last night he started vomiting, vomited x 4. Pt states that his abdomen is distended. Pt states that he had a PET scan done yesterday. Pt in NAD at this time.

## 2017-10-11 LAB — BASIC METABOLIC PANEL WITH GFR
Anion gap: 10 (ref 5–15)
BUN: 16 mg/dL (ref 6–20)
CO2: 27 mmol/L (ref 22–32)
Calcium: 8.6 mg/dL — ABNORMAL LOW (ref 8.9–10.3)
Chloride: 106 mmol/L (ref 101–111)
Creatinine, Ser: 0.89 mg/dL (ref 0.61–1.24)
GFR calc Af Amer: >60 mL/min
GFR calc non Af Amer: >60 mL/min
Glucose, Bld: 105 mg/dL — ABNORMAL HIGH (ref 65–99)
Potassium: 2.7 mmol/L — CL (ref 3.5–5.1)
Sodium: 143 mmol/L (ref 135–145)

## 2017-10-11 LAB — MAGNESIUM: Magnesium: 1.9 mg/dL (ref 1.7–2.4)

## 2017-10-11 LAB — POTASSIUM: Potassium: 3.3 mmol/L — ABNORMAL LOW (ref 3.5–5.1)

## 2017-10-11 MED ORDER — MAGNESIUM CITRATE PO SOLN
0.5000 | Freq: Once | ORAL | Status: AC
  Administered 2017-10-11: 22:00:00 0.5 via ORAL
  Filled 2017-10-11: qty 296

## 2017-10-11 MED ORDER — POTASSIUM CHLORIDE 2 MEQ/ML IV SOLN
INTRAVENOUS | Status: DC
  Administered 2017-10-11 – 2017-10-15 (×5): via INTRAVENOUS
  Filled 2017-10-11 (×9): qty 1000

## 2017-10-11 MED ORDER — POTASSIUM CHLORIDE 10 MEQ/100ML IV SOLN
10.0000 meq | INTRAVENOUS | Status: AC
  Administered 2017-10-11: 10 meq via INTRAVENOUS
  Filled 2017-10-11: qty 100

## 2017-10-11 MED ORDER — KCL-LACTATED RINGERS 20 MEQ/L IV SOLN
INTRAVENOUS | Status: DC
  Filled 2017-10-11: qty 1000

## 2017-10-11 MED ORDER — POTASSIUM CHLORIDE 10 MEQ/100ML IV SOLN
10.0000 meq | INTRAVENOUS | Status: AC
  Administered 2017-10-11 (×3): 10 meq via INTRAVENOUS
  Filled 2017-10-11 (×3): qty 100

## 2017-10-11 NOTE — Care Management Obs Status (Signed)
[facility] NOTIFICATION   Patient Details  Name: Louis Davies MRN: [id number] Date of Birth: December 21, 1950   Medicare Observation Status Notification Given:  Yes    Jagdeep Ancheta A, RN 10/11/2017, 2:21 PM

## 2017-10-11 NOTE — Progress Notes (Signed)
Patient had small amount of watery stool  after taking  Lactulose and fleet enema. NGT in place and is working efficiently with 1150 mL of output  as of 0630. Denied any acute pain. No N/V this shift. Will continue to monitor.

## 2017-10-11 NOTE — Progress Notes (Signed)
Louis Davies    MR#:  [id number]  DATE OF BIRTH:  August 18, 1951  SUBJECTIVE:   Patient here due to obstipation/constipation noted to have an ileus/early partial SBO. Denies any nausea or vomiting. Says that his abdominal pain/fullness has improved since yesterday.  REVIEW OF SYSTEMS:    Review of Systems  Constitutional: Negative for chills and fever.  HENT: Negative for congestion and tinnitus.   Eyes: Negative for blurred vision and double vision.  Respiratory: Negative for cough, shortness of breath and wheezing.   Cardiovascular: Negative for chest pain, orthopnea and PND.  Gastrointestinal: Negative for abdominal pain, diarrhea, nausea and vomiting.  Genitourinary: Negative for dysuria and hematuria.  Neurological: Negative for dizziness, sensory change and focal weakness.  All other systems reviewed and are negative.   Nutrition: Clear Liquid Tolerating Diet: Yes Tolerating PT: Ambulatory  DRUG ALLERGIES:  No Known Allergies  VITALS:  Blood pressure (!) 141/83, pulse 71, temperature 98.6 F (37 C), temperature source Oral, resp. rate 20, height 5\' 11"  (1.803 m), weight 89.4 kg (197 lb 3.2 oz), SpO2 93 %.  PHYSICAL EXAMINATION:   Physical Exam  GENERAL:  66 y.o.-year-old patient lying in bed in no acute distress.  EYES: Pupils equal, round, reactive to light and accommodation. No scleral icterus. Extraocular muscles intact.  HEENT: Head atraumatic, normocephalic. Oropharynx and nasopharynx clear. NG in place.   NECK:  Supple, no jugular venous distention. No thyroid enlargement, no tenderness.  LUNGS: Normal breath sounds bilaterally, no wheezing, rales, rhonchi. No use of accessory muscles of respiration.  CARDIOVASCULAR: S1, S2 normal. No murmurs, rubs, or gallops.  ABDOMEN: Soft, nontender, nondistended. Bowel sounds present. No organomegaly or mass.  EXTREMITIES: No cyanosis, clubbing or edema  b/l.    NEUROLOGIC: Cranial nerves II through XII are intact. No focal Motor or sensory deficits b/l.   PSYCHIATRIC: The patient is alert and oriented x 3.  SKIN: No obvious rash, lesion, or ulcer.    LABORATORY PANEL:   CBC Recent Labs  Lab 10/10/17 1807  WBC 6.5  HGB 13.4  HCT 41.1  PLT 220   ------------------------------------------------------------------------------------------------------------------  Chemistries  Recent Labs  Lab 10/10/17 1114  10/11/17 0606  NA 139  --  143  K 3.5  --  2.7*  CL 104  --  106  CO2 25  --  27  GLUCOSE 117*  --  105*  BUN 16  --  16  CREATININE 0.77   < > 0.89  CALCIUM 9.6  --  8.6*  MG  --   --  1.9  AST 25  --   --   ALT 22  --   --   ALKPHOS 74  --   --   BILITOT 0.9  --   --    < > = values in this interval not displayed.   ------------------------------------------------------------------------------------------------------------------  Cardiac Enzymes No results for input(s): TROPONINI in the last 168 hours. ------------------------------------------------------------------------------------------------------------------  RADIOLOGY:  Dg Abdomen 1 View  Result Date: 10/10/2017 CLINICAL DATA:  66 year old male status post NG tube placement EXAM: ABDOMEN - 1 VIEW COMPARISON:  Prior abdominal radiograph 10/10/2017 FINDINGS: Interval placement of a gastric tube. The tube is coiled in the stomach. Slightly improved gaseous distension of the small bowel. Persisting gaseous distension of the colon. Large colonic stool burden. IMPRESSION: Newly placed nasogastric tube is coiled in the stomach. Electronically Signed   By: Dellis Filbert.D.  On: 10/10/2017 17:10   Dg Abd 1 View  Result Date: 10/10/2017 CLINICAL DATA:  66 year old presenting with no bowel movement in the past 6 days and acute onset of vomiting that began last night, associated with abdominal distention. Patient currently undergoing immunotherapy for  metastatic melanoma. EXAM: ABDOMEN - 1 VIEW COMPARISON:  PET-CT yesterday and 07/15/2017. FINDINGS: Multiple dilated loops of small bowel in the upper abdomen as noted on the PET-CT yesterday. Very large colonic stool burden. Gas and stool throughout the upper normal caliber colon from cecum to rectum. No suggestion of free air on the supine image. IMPRESSION: 1. Stable small bowel distention since yesterday's PET-CT, ileus versus early partial small bowel obstruction. Serial follow-up abdominal x-rays may be helpful. 2. Very large colonic stool burden. Electronically Signed   By: Evangeline Dakin M.D.   On: 10/10/2017 11:50     ASSESSMENT AND PLAN:   66 year old male with past medical history of melanoma currently on immunotherapy presented to the hospital due to abdominal pain and noted to have an ileus.  1. Ileus-this is the cause of patient's abdominal pain. This is secondary to severe constipation or possibly due to his underlying immunotherapy. -Patient's abdominal fullness is improved after patient got a Fleet's enema yesterday. -Patient did not have a BM despite getting the fleets yesterday, will give tap water enema today. Continue lactulose and follow response  2. Hypokalemia-secondary to volume loss from the NG tube -We'll continue to replace intravenously as patient cannot take by mouth and follow potassium level. We'll check mag level in the morning.  3. History of melanoma - currently on immunotherapy, follows with Dr. Grayland Ormond.   All the records are reviewed and case discussed with Care Management/Social Worker. Management plans discussed with the patient, family and they are in agreement.  CODE STATUS: Full code  DVT Prophylaxis: Lovenox  TOTAL TIME TAKING CARE OF THIS PATIENT: 30 minutes.   POSSIBLE D/C IN 1-2 DAYS, DEPENDING ON CLINICAL CONDITION.   Henreitta Leber M.D on 10/11/2017 at 1:01 PM  Between 7am to 6pm - Pager - [phone]  After 6pm go to  [email or web address] - Technical brewer [place] [facility]  Office  [phone]  CC: Primary care physician; Care, [facility]

## 2017-10-12 ENCOUNTER — Observation Stay: Payer: Medicare Other

## 2017-10-12 DIAGNOSIS — C799 Secondary malignant neoplasm of unspecified site: Secondary | ICD-10-CM | POA: Diagnosis present

## 2017-10-12 DIAGNOSIS — R198 Other specified symptoms and signs involving the digestive system and abdomen: Secondary | ICD-10-CM | POA: Diagnosis not present

## 2017-10-12 DIAGNOSIS — R109 Unspecified abdominal pain: Secondary | ICD-10-CM | POA: Diagnosis not present

## 2017-10-12 DIAGNOSIS — R5383 Other fatigue: Secondary | ICD-10-CM | POA: Diagnosis not present

## 2017-10-12 DIAGNOSIS — K5900 Constipation, unspecified: Secondary | ICD-10-CM | POA: Diagnosis not present

## 2017-10-12 DIAGNOSIS — K634 Enteroptosis: Secondary | ICD-10-CM | POA: Diagnosis not present

## 2017-10-12 DIAGNOSIS — Z8041 Family history of malignant neoplasm of ovary: Secondary | ICD-10-CM | POA: Diagnosis not present

## 2017-10-12 DIAGNOSIS — K567 Ileus, unspecified: Secondary | ICD-10-CM | POA: Diagnosis present

## 2017-10-12 DIAGNOSIS — E860 Dehydration: Secondary | ICD-10-CM | POA: Diagnosis present

## 2017-10-12 DIAGNOSIS — Z85820 Personal history of malignant melanoma of skin: Secondary | ICD-10-CM | POA: Diagnosis not present

## 2017-10-12 DIAGNOSIS — E876 Hypokalemia: Secondary | ICD-10-CM | POA: Diagnosis present

## 2017-10-12 DIAGNOSIS — D649 Anemia, unspecified: Secondary | ICD-10-CM | POA: Diagnosis not present

## 2017-10-12 DIAGNOSIS — Z806 Family history of leukemia: Secondary | ICD-10-CM | POA: Diagnosis not present

## 2017-10-12 DIAGNOSIS — Z9221 Personal history of antineoplastic chemotherapy: Secondary | ICD-10-CM | POA: Diagnosis not present

## 2017-10-12 DIAGNOSIS — C439 Malignant melanoma of skin, unspecified: Secondary | ICD-10-CM | POA: Diagnosis not present

## 2017-10-12 DIAGNOSIS — R531 Weakness: Secondary | ICD-10-CM | POA: Diagnosis not present

## 2017-10-12 DIAGNOSIS — M129 Arthropathy, unspecified: Secondary | ICD-10-CM | POA: Diagnosis not present

## 2017-10-12 LAB — BASIC METABOLIC PANEL
Anion gap: 11 (ref 5–15)
BUN: 16 mg/dL (ref 6–20)
CHLORIDE: 103 mmol/L (ref 101–111)
CO2: 28 mmol/L (ref 22–32)
Calcium: 9.1 mg/dL (ref 8.9–10.3)
Creatinine, Ser: 0.83 mg/dL (ref 0.61–1.24)
Glucose, Bld: 116 mg/dL — ABNORMAL HIGH (ref 65–99)
POTASSIUM: 3 mmol/L — AB (ref 3.5–5.1)
SODIUM: 142 mmol/L (ref 135–145)

## 2017-10-12 MED ORDER — POTASSIUM CHLORIDE 10 MEQ/100ML IV SOLN
10.0000 meq | INTRAVENOUS | Status: AC
  Administered 2017-10-12 (×3): 10 meq via INTRAVENOUS
  Filled 2017-10-12 (×3): qty 100

## 2017-10-12 MED ORDER — POTASSIUM CHLORIDE 10 MEQ/100ML IV SOLN
INTRAVENOUS | Status: AC
  Administered 2017-10-12: 10 meq
  Filled 2017-10-12: qty 100

## 2017-10-12 NOTE — Consult Note (Signed)
SURGICAL CONSULTATION NOTE (initial) - cpt: [id number]  HISTORY OF PRESENT ILLNESS (HPI):  66 y.o. male who recently started immunotherapy for metastatic melanoma presented to [facility] ED 2 days ago for evaluation of abdominal distention with no BM x 6 days and moderate discomfort/pain without N/V. Patient reports he began not feeling well with dry heaves 8 days ago, since which time he has regularly passed flatus, but no BM's in contrast to usually passing 1 - 2 BM per day with no history of constipation, abdominal surgeries, or known hernias. Staging PET-CT was performed this past Friday (the day before he presented to [facility] ED), and patient has underwent daily abdominal x-rays Saturday, Sunday, and today. Patient asks why his NG tube was left to suction after he drank magnesium citrate, and patient's RN says his NG was left off suction x 30 minutes after patient drank lactulose and for clear liquids diet prn. Today, patient passed 8 BM's, denies fever/chils, CP, or SOB. Patient also reports his last colonoscopy was 2016 and says the bowel prep at that time "cleared [him] out".  Surgery is consulted by medical physician Dr. Verdell Carmine in this context for evaluation and management of ileus.  PAST MEDICAL HISTORY (PMH):  Past Medical History:  Diagnosis Date  . Anemia   . Arthritis   . Complication of anesthesia   . PONV (postoperative nausea and vomiting)      PAST SURGICAL HISTORY ([place]):  Past Surgical History:  Procedure Laterality Date  . AXILLARY LYMPH NODE DISSECTION Left 06/26/2017   Procedure: EXCISION OF LEFT AXILLARY MASS;  Surgeon: Johnathan Hausen, MD;  Location: [facility] ORS;  Service: General;  Laterality: Left;  Marland Kitchen VASECTOMY       MEDICATIONS:  Prior to Admission medications   Medication Sig Start Date End Date Taking? Authorizing Provider  bisacodyl (BISACODYL) 5 MG EC tablet Take 5 mg by mouth daily as needed for moderate constipation.   Yes [provider]  Multiple Vitamin  (MULTIVITAMIN) tablet Take 1 tablet by mouth daily.   Yes [provider]     ALLERGIES:  No Known Allergies   SOCIAL HISTORY:  Social History   Socioeconomic History  . Marital status: Divorced    Spouse name: Not on file  . Number of children: Not on file  . Years of education: Not on file  . Highest education level: Not on file  Social Needs  . Financial resource strain: Not on file  . Food insecurity - worry: Not on file  . Food insecurity - inability: Not on file  . Transportation needs - medical: Not on file  . Transportation needs - non-medical: Not on file  Occupational History  . Not on file  Tobacco Use  . Smoking status: Never Smoker  . Smokeless tobacco: Never Used  Substance and Sexual Activity  . Alcohol use: No  . Drug use: No  . Sexual activity: Yes  Other Topics Concern  . Not on file  Social History Narrative  . Not on file    The patient currently resides (home / rehab facility / nursing home): Home The patient normally is (ambulatory / bedbound): Ambulatory   FAMILY HISTORY:  Family History  Problem Relation Age of Onset  . Diabetes Mother   . Hypertension Mother   . Leukemia Father   . Thyroid disease Sister   . Liver disease Maternal Grandmother   . Heart Problems Maternal Grandfather   . Diabetes Maternal Grandfather   . Lung cancer Paternal Grandmother   .  Prostate cancer Paternal Grandfather      REVIEW OF SYSTEMS:  Constitutional: denies weight loss, fever, chills, or sweats  Eyes: denies any other vision changes, history of eye injury  ENT: denies sore throat, hearing problems  Respiratory: denies shortness of breath, wheezing  Cardiovascular: denies chest pain, palpitations  Gastrointestinal: abdominal pain, N/V, and bowel function as per HPI Genitourinary: denies burning with urination or urinary frequency Musculoskeletal: denies any other joint pains or cramps  Skin: denies any other rashes or skin discolorations   Neurological: denies any other headache, dizziness, weakness  Psychiatric: denies any other depression, anxiety   All other review of systems were negative   VITAL SIGNS:  Temp:  [98.6 F (37 C)-99.9 F (37.7 C)] 98.7 F (37.1 C) (12/24 1233) Pulse Rate:  [66-85] 66 (12/24 1233) Resp:  [20] 20 (12/24 1233) BP: (133-150)/(70-76) 133/72 (12/24 1233) SpO2:  [96 %-99 %] 96 % (12/24 1233) Weight:  [195 lb 1.6 oz (88.5 kg)] 195 lb 1.6 oz (88.5 kg) (12/24 1455)     Height: 5\' 11"  (180.3 cm) Weight: 195 lb 1.6 oz (88.5 kg)(bed scale) BMI (Calculated): 27.22   INTAKE/OUTPUT:  This shift: No intake/output data recorded.  Last 2 shifts: @IOLAST2SHIFTS @   PHYSICAL EXAM:  Constitutional:  -- Normal body habitus  -- Awake, alert, and oriented x3, no apparent distress Eyes:  -- Pupils equally round and reactive to light  -- No scleral icterus, B/L no occular discharge Ear, nose, throat: -- Neck is FROM WNL -- No jugular venous distension  Pulmonary:  -- No wheezes or rhales -- Equal breath sounds bilaterally -- Breathing non-labored at rest Cardiovascular:  -- S1, S2 present  -- No pericardial rubs  Gastrointestinal:  -- Abdomen soft, but moderately distended with only minimal tenderness to palpation, no guarding or rebound tenderness -- No abdominal hernias or masses appreciated, pulsatile or otherwise  Musculoskeletal and Integumentary:  -- Wounds or skin discoloration: None appreciated -- Extremities: B/L UE and LE FROM, hands and feet warm, no edema  Neurologic:  -- Motor function: Intact and symmetric -- Sensation: Intact and symmetric Psychiatric:  -- Mood and affect WNL  Labs:  CBC Latest Ref Rng & Units 10/10/2017 10/10/2017 09/23/2017  WBC 3.8 - 10.6 K/uL 6.5 7.8 5.5  Hemoglobin 13.0 - 18.0 g/dL 13.4 14.0 12.9(L)  Hematocrit 40.0 - 52.0 % 41.1 43.2 39.3(L)  Platelets 150 - 440 K/uL 220 249 223   CMP Latest Ref Rng & Units 10/12/2017 10/11/2017 10/11/2017  Glucose  65 - 99 mg/dL 116(H) - 105(H)  BUN 6 - 20 mg/dL 16 - 16  Creatinine 0.61 - 1.24 mg/dL 0.83 - 0.89  Sodium 135 - 145 mmol/L 142 - 143  Potassium 3.5 - 5.1 mmol/L 3.0(L) 3.3(L) 2.7(LL)  Chloride 101 - 111 mmol/L 103 - 106  CO2 22 - 32 mmol/L 28 - 27  Calcium 8.9 - 10.3 mg/dL 9.1 - 8.6(L)  Total Protein 6.5 - 8.1 g/dL - - -  Total Bilirubin 0.3 - 1.2 mg/dL - - -  Alkaline Phos 38 - 126 U/L - - -  AST 15 - 41 U/L - - -  ALT 17 - 63 U/L - - -    Imaging studies:  PET-CT Whole Body Imaging (10/09/2017) - CT personally reviewed and discussed with patient Borderline dilated loops of pelvic small bowel without a well- defined transition point. Rectal air- fluid level favoring diarrheal  process. No definite hypermetabolic activity within or along the  small bowel  to further suggest metastatic disease to the bowel.  Prior accentuated activity at anorectal junction has resolved.  No abnormal hypermetabolic activity within the liver, pancreas, adrenal glands, or spleen. No hypermetabolic lymph nodes in the abdomen or pelvis. A small hypodense lesion in the vicinity of the  falciform ligament is not appreciably hypermetabolic and appear stable from prior, possibly focal fatty infiltration of the liver.  Dependent density in the gallbladder favor sludge.  Abdominal X-ray (10/10/2017) - personally reviewed and discussed with patient 1. Stable small bowel distention since yesterday's PET-CT, ileus versus early partial small bowel obstruction. Serial follow-up abdominal x-rays may be helpful. 2. Very large colonic stool burden  Abdominal X-ray (10/12/2017) - personally reviewed and discussed with patient Increasing distention of small and large bowel loops with air, with small bowel loops measuring up to 7.0 cm in diameter. This is suspicious for worsening ileus. No definite evidence for bowel obstruction. No free intra-abdominal air seen.  Assessment/Plan: (ICD-10's: K59.00) 66 y.o. male  with severe constipation +/- ileus, likely secondary to constipation, improving somewhat with current management (enemas and oral agents, though limited by short duration off suction per patient and RN reports), complicated by pertinent comorbidities including recent immunotherapy for metastatic melanoma, mild chronic anemia, and osteoarthritis.              - NPO, IVF  - no indication for surgical intervention at this time             - monitor ongoing bowel function and abdominal exam              - NG tube to low intermittent suction without interruption for nasogastric decompression             - if feeling better tomorrow, may continue magnesium citrate, lactulose, etc + enemas (NG tube 1 - 2 hours off suction)  - if still distended and not feeling significantly better with much more BM's, consider bowel prep (GoLytely, etc)             - medical management comorbidities as per medical team             - ambulation encouraged              - DVT prophylaxis  Thank you for the opportunity to participate in this patient's care.   -- Marilynne Drivers Rosana Hoes, MD, [place]: [facility] - Partnering for exceptional care. Office: [phone]

## 2017-10-12 NOTE — Progress Notes (Signed)
Initial Nutrition Assessment  DOCUMENTATION CODES:   Not applicable  INTERVENTION:  Although patient is on clear liquids, no need to order a clear oral nutrition supplement as it would likely just be suctioned out through NGT.  Will monitor plan of care. When diet is able to be advanced and NGT removed, patient would benefit from Ensure Enlive BID.  If diet is unable to be advanced, will need to consider TPN by 12/29-1/1 (7-10 days of inadequate intake).  NUTRITION DIAGNOSIS:   Inadequate oral intake related to nausea, vomiting, other (see comment)(abdominal pain and distention) as evidenced by per patient/family report.  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   -Pending surgery consult.  Met with patient at bedside. He reports he began experiencing abdominal pain, distention, N/V on 12/22. Since then he has not been able to eat anything. He is currently on CLD, but only taking small sips. As patient has NGT it is all coming back up through the tube anyway. Patient reports he is passing flatus and having bowel movements, but his abdomen does not feel better and it remains distended. RD assessed abdomen and it is distended but not taut. He reports that prior to Saturday he ate very well.   UBW 202 lbs. Patient was 202.3 lbs on 09/23/2017. On 12/22 he was down to 197.2 lbs. RD obtained bed scale weight of 195.1 lbs. He has lost 2.1 lbs (1.1% body weight) over the past 2 days.  Medications reviewed and include: lactulose 30 grams BID, MVI daily, LR @ 50 mL/hr, potassium chloride 10 mEq 4 times today IV.  Labs  reviewed: Potassium 3.  Patient has 16 Fr. NGT to LIS. 1500 mL output documented yesterday. At time of RD assessment patient had approximately 650 mL output in cannister.  5 BMs yesterday and 2 BMs today per chart.  Patient does not meet criteria for malnutrition at this time.  NUTRITION - FOCUSED PHYSICAL EXAM:    Most Recent Value  Orbital Region  No depletion  Upper Arm Region  No depletion  Thoracic and Lumbar Region  No depletion  Buccal Region  No depletion  Temple Region  Mild depletion  Clavicle Bone Region  No depletion  Clavicle and Acromion Bone Region  No depletion  Scapular Bone Region  No depletion  Dorsal Hand  No depletion  Patellar Region  No depletion  Anterior Thigh Region  No depletion  Posterior Calf Region  No depletion  Edema (RD Assessment)  None  Hair  Reviewed  Eyes  Reviewed  Mouth  Reviewed  Skin  Reviewed  Nails  Reviewed     Diet Order:  Diet clear liquid Room service appropriate? Yes; Fluid consistency: Thin  EDUCATION NEEDS:   No education needs have been identified at this time  Skin:  Skin Assessment: Reviewed RN Assessment  Last BM:  10/12/2017 - small type 6  Height:   Ht Readings from Last 1 Encounters:  10/10/17 _0  (1.803 m)    Weight:   Wt Readings from Last 1 Encounters:  10/12/17 195 lb 1.6 oz (88.5 kg)  Ideal Body Weight:  78.2 kg  BMI:  Body mass index is 27.21 kg/m.  Estimated Nutritional Needs:   Kcal:  2015-2350 (MSJ x 1.2-1.4)  Protein:  90-105 grams (1-1.2 grams/kg)  Fluid:  2.2-2.6 L/day (25-30 mL/kg)  Willey Blade, MS, RD, LDN Office: [phone] Pager: [phone] After Hours/Weekend Pager: [phone]

## 2017-10-12 NOTE — Progress Notes (Signed)
[place] [facility] [place] NAME: Louis Davies    MR#:  [id number]  DATE OF BIRTH:  [birth year]  SUBJECTIVE:   Patient here due to obstipation/constipation noted to have an ileus/early partial SBO. Patient's x-ray this morning showing significantly dilated small bowel loops consistent with ileus. Patient did respond to the tap water enema had multiple BMs yesterday. Abdomen is still somewhat distended.  REVIEW OF SYSTEMS:    Review of Systems  Constitutional: Negative for chills and fever.  HENT: Negative for congestion and tinnitus.   Eyes: Negative for blurred vision and double vision.  Respiratory: Negative for cough, shortness of breath and wheezing.   Cardiovascular: Negative for chest pain, orthopnea and PND.  Gastrointestinal: Negative for abdominal pain, diarrhea, nausea and vomiting.  Genitourinary: Negative for dysuria and hematuria.  Neurological: Negative for dizziness, sensory change and focal weakness.  All other systems reviewed and are negative.   Nutrition: Clear Liquid Tolerating Diet: Yes Tolerating PT: Ambulatory  DRUG ALLERGIES:  No Known Allergies  VITALS:  Blood pressure 133/72, pulse 66, temperature 98.7 F (37.1 C), temperature source Oral, resp. rate 20, height 5\' 11"  (1.803 m), weight 89.4 kg (197 lb 3.2 oz), SpO2 96 %.  PHYSICAL EXAMINATION:   Physical Exam  GENERAL:  66 y.o.-year-old patient lying in bed in no acute distress.  EYES: Pupils equal, round, reactive to light and accommodation. No scleral icterus. Extraocular muscles intact.  HEENT: Head atraumatic, normocephalic. Oropharynx and nasopharynx clear. NG in place with some coffee ground material draining.   NECK:  Supple, no jugular venous distention. No thyroid enlargement, no tenderness.  LUNGS: Normal breath sounds bilaterally, no wheezing, rales, rhonchi. No use of accessory muscles of respiration.  CARDIOVASCULAR: S1, S2 normal. No murmurs, rubs,  or gallops.  ABDOMEN: Soft, nontender, distended. Hypoactive Bowel sounds present. No organomegaly or mass.  EXTREMITIES: No cyanosis, clubbing or edema b/l.    NEUROLOGIC: Cranial nerves II through XII are intact. No focal Motor or sensory deficits b/l.   PSYCHIATRIC: The patient is alert and oriented x 3.  SKIN: No obvious rash, lesion, or ulcer.    LABORATORY PANEL:   CBC Recent Labs  Lab 10/10/17 1807  WBC 6.5  HGB 13.4  HCT 41.1  PLT 220   ------------------------------------------------------------------------------------------------------------------  Chemistries  Recent Labs  Lab 10/10/17 1114  10/11/17 0606  10/12/17 0945  NA 139  --  143  --  142  K 3.5  --  2.7*   < > 3.0*  CL 104  --  106  --  103  CO2 25  --  27  --  28  GLUCOSE 117*  --  105*  --  116*  BUN 16  --  16  --  16  CREATININE 0.77   < > 0.89  --  0.83  CALCIUM 9.6  --  8.6*  --  9.1  MG  --   --  1.9  --   --   AST 25  --   --   --   --   ALT 22  --   --   --   --   ALKPHOS 74  --   --   --   --   BILITOT 0.9  --   --   --   --    < > = values in this interval not displayed.   ------------------------------------------------------------------------------------------------------------------  Cardiac Enzymes No results for input(s): TROPONINI in the  last 168 hours. ------------------------------------------------------------------------------------------------------------------  RADIOLOGY:  Dg Abd 1 View  Result Date: 10/12/2017 CLINICAL DATA:  Evaluate ileus. EXAM: ABDOMEN - 1 VIEW COMPARISON:  Abdominal radiograph performed 10/10/2017 FINDINGS: There is diffuse distention of small and large bowel loops with air, with small-bowel loops measuring up to 7.0 cm in diameter. This is suspicious for worsening ileus. The patient's enteric tube is seen ending overlying the body of the stomach. No free intra-abdominal air is seen, though evaluation for free air is limited on supine views. No acute  osseous abnormalities are identified. IMPRESSION: Increasing distention of small and large bowel loops with air, with small bowel loops measuring up to 7.0 cm in diameter. This is suspicious for worsening ileus. No definite evidence for bowel obstruction. No free intra-abdominal air seen. Electronically Signed   By: Garald Balding M.D.   On: 10/12/2017 01:32   Dg Abdomen 1 View  Result Date: 10/10/2017 CLINICAL DATA:  66 year old male status post NG tube placement EXAM: ABDOMEN - 1 VIEW COMPARISON:  Prior abdominal radiograph 10/10/2017 FINDINGS: Interval placement of a gastric tube. The tube is coiled in the stomach. Slightly improved gaseous distension of the small bowel. Persisting gaseous distension of the colon. Large colonic stool burden. IMPRESSION: Newly placed nasogastric tube is coiled in the stomach. Electronically Signed   By: Jacqulynn Cadet M.D.   On: 10/10/2017 17:10     ASSESSMENT AND PLAN:   66 year old male with past medical history of melanoma currently on immunotherapy presented to the hospital due to abdominal pain and noted to have an ileus.  1. Ileus-this is the cause of patient's abdominal pain. This is secondary to severe constipation -Continue NG tube decompression, patient given fleets and Water and was with good response and patient had multiple bowel movements yesterday. -X-ray although this morning showing significantly dilated small bowel loops concerning with worsening ileus. I will get a surgical consult today. Continue supportive care with IV fluids, antiemetics, NG tube decompression and laxatives for now. continue lactulose. -Will repeat KUB in the morning.  2. Hypokalemia-secondary to volume loss from the NG tube -Continue to replace intravenously as patient cannot take by mouth. Magnesium level was normal. We'll repeat potassium in the morning.  3. History of melanoma - currently on immunotherapy, follows with Dr. Grayland Ormond.   All the records are  reviewed and case discussed with Care Management/Social Worker. Management plans discussed with the patient, family and they are in agreement.  CODE STATUS: Full code  DVT Prophylaxis: Lovenox  TOTAL TIME TAKING CARE OF THIS PATIENT: 30 minutes.   POSSIBLE D/C IN 1-2 DAYS, DEPENDING ON CLINICAL CONDITION.   Henreitta Leber M.D on 10/12/2017 at 2:20 PM  Between 7am to 6pm - Pager - [phone]  After 6pm go to [email or web address] - [employer] [facility]  Office  [phone]  CC: Primary care physician; Care, [facility]

## 2017-10-13 ENCOUNTER — Inpatient Hospital Stay: Payer: Medicare Other

## 2017-10-13 DIAGNOSIS — K567 Ileus, unspecified: Secondary | ICD-10-CM

## 2017-10-13 LAB — POTASSIUM: POTASSIUM: 3.2 mmol/L — AB (ref 3.5–5.1)

## 2017-10-13 LAB — PHOSPHORUS: Phosphorus: 3.9 mg/dL (ref 2.5–4.6)

## 2017-10-13 LAB — MAGNESIUM: MAGNESIUM: 1.8 mg/dL (ref 1.7–2.4)

## 2017-10-13 NOTE — Progress Notes (Signed)
CC: Ileus Subjective: 66 year old male with known metastatic melanoma ileus.  Patient reports feeling much better this morning.  He has had minimal output per his NG tube and denies any nausea or vomiting.  He has been having bowel function and had a normal large bowel movement this morning after his x-rays.  States he is hungry and would like his tube removed.  Objective: Vital signs in last 24 hours: Temp:  [98.4 F (36.9 C)-98.9 F (37.2 C)] 98.9 F (37.2 C) (12/25 0501) Pulse Rate:  [66-74] 74 (12/25 0501) Resp:  [14-20] 18 (12/25 0501) BP: (130-160)/(69-80) 130/69 (12/25 0501) SpO2:  [92 %-96 %] 92 % (12/25 0501) Weight:  [88.5 kg (195 lb 1.6 oz)] 88.5 kg (195 lb 1.6 oz) (12/24 1455) Last BM Date: 10/12/17  Intake/Output from previous day: 12/24 0701 - 12/25 0700 In: 1290.8 [I.V.:1290.8] Out: 375 [Emesis/NG output:375] Intake/Output this shift: No intake/output data recorded.  Physical exam:  General: No acute distress Chest: Clear to auscultation Heart: Rate and rhythm Abdomen: Soft, nontender, minimally distended  Lab Results: CBC  Recent Labs    10/10/17 1114 10/10/17 1807  WBC 7.8 6.5  HGB 14.0 13.4  HCT 43.2 41.1  PLT 249 220   BMET Recent Labs    10/11/17 0606  10/12/17 0945 10/13/17 0617  NA 143  --  142  --   K 2.7*   < > 3.0* 3.2*  CL 106  --  103  --   CO2 27  --  28  --   GLUCOSE 105*  --  116*  --   BUN 16  --  16  --   CREATININE 0.89  --  0.83  --   CALCIUM 8.6*  --  9.1  --    < > = values in this interval not displayed.   PT/INR No results for input(s): LABPROT, INR in the last 72 hours. ABG No results for input(s): PHART, HCO3 in the last 72 hours.  Invalid input(s): PCO2, PO2  Studies/Results: Dg Abd 1 View  Result Date: 10/13/2017 CLINICAL DATA:  Ileus EXAM: ABDOMEN - 1 VIEW COMPARISON:  10/12/2017 FINDINGS: Gaseous distension of large and small bowel again noted. No free air organomegaly. No acute bony abnormality.  IMPRESSION: Stable gaseous distention of large and small bowel, most compatible with ileus. Electronically Signed   By: Rolm Baptise M.D.   On: 10/13/2017 07:37   Dg Abd 1 View  Result Date: 10/12/2017 CLINICAL DATA:  Evaluate ileus. EXAM: ABDOMEN - 1 VIEW COMPARISON:  Abdominal radiograph performed 10/10/2017 FINDINGS: There is diffuse distention of small and large bowel loops with air, with small-bowel loops measuring up to 7.0 cm in diameter. This is suspicious for worsening ileus. The patient's enteric tube is seen ending overlying the body of the stomach. No free intra-abdominal air is seen, though evaluation for free air is limited on supine views. No acute osseous abnormalities are identified. IMPRESSION: Increasing distention of small and large bowel loops with air, with small bowel loops measuring up to 7.0 cm in diameter. This is suspicious for worsening ileus. No definite evidence for bowel obstruction. No free intra-abdominal air seen. Electronically Signed   By: Garald Balding M.D.   On: 10/12/2017 01:32    Anti-infectives: Anti-infectives (From admission, onward)   None      Assessment/Plan:  66 year old male with a likely ileus secondary to chemotherapy for his metastatic melanoma.  Much improved clinically.  Due to minimal output there are no symptoms  his NG tube was removed this morning per my order.  Discussed with the primary team okay for clear liquids for now would not advance to full liquids until this evening so long as he tolerates the clears.  Encourage ambulation and incentive spirometer usage.  General surgery will continue to follow with you.  Adrieana Fennelly T. Adonis Huguenin, MD, [facility] General Surgeon [facility]  Day [facility] [phone] Night [facility] [phone] 10/13/2017

## 2017-10-14 ENCOUNTER — Inpatient Hospital Stay: Payer: Medicare Other | Admitting: Anesthesiology

## 2017-10-14 ENCOUNTER — Inpatient Hospital Stay: Payer: Medicare Other

## 2017-10-14 ENCOUNTER — Telehealth: Payer: Self-pay | Admitting: *Deleted

## 2017-10-14 ENCOUNTER — Encounter
Admission: EM | Disposition: A | Discharge status: Home / Self Care | Payer: Self-pay | Source: Home / Self Care | Attending: Specialist

## 2017-10-14 ENCOUNTER — Ambulatory Visit: Payer: Medicare Other | Admitting: Oncology

## 2017-10-14 ENCOUNTER — Encounter: Payer: Self-pay | Admitting: Radiology

## 2017-10-14 ENCOUNTER — Other Ambulatory Visit: Payer: Medicare Other

## 2017-10-14 ENCOUNTER — Ambulatory Visit: Payer: Medicare Other

## 2017-10-14 DIAGNOSIS — R198 Other specified symptoms and signs involving the digestive system and abdomen: Secondary | ICD-10-CM

## 2017-10-14 HISTORY — PX: LAPAROTOMY: SHX154

## 2017-10-14 LAB — BASIC METABOLIC PANEL
ANION GAP: 6 (ref 5–15)
BUN: 21 mg/dL — ABNORMAL HIGH (ref 6–20)
CO2: 27 mmol/L (ref 22–32)
Calcium: 8.6 mg/dL — ABNORMAL LOW (ref 8.9–10.3)
Chloride: 104 mmol/L (ref 101–111)
Creatinine, Ser: 0.77 mg/dL (ref 0.61–1.24)
GFR calc Af Amer: >60 mL/min (ref >60)
GFR calc non Af Amer: >60 mL/min (ref >60)
GLUCOSE: 108 mg/dL — AB (ref 65–99)
POTASSIUM: 3.6 mmol/L (ref 3.5–5.1)
Sodium: 137 mmol/L (ref 135–145)

## 2017-10-14 SURGERY — LAPAROTOMY, EXPLORATORY
Anesthesia: General

## 2017-10-14 MED ORDER — ONDANSETRON HCL 4 MG/2ML IJ SOLN
INTRAMUSCULAR | Status: AC
  Filled 2017-10-14: qty 2

## 2017-10-14 MED ORDER — FENTANYL CITRATE (PF) 100 MCG/2ML IJ SOLN
INTRAMUSCULAR | Status: DC | PRN
  Administered 2017-10-14: 50 ug via INTRAVENOUS
  Administered 2017-10-14: 100 ug via INTRAVENOUS
  Administered 2017-10-14: 50 ug via INTRAVENOUS

## 2017-10-14 MED ORDER — DEXAMETHASONE SODIUM PHOSPHATE 10 MG/ML IJ SOLN
INTRAMUSCULAR | Status: DC | PRN
  Administered 2017-10-14: 10 mg via INTRAVENOUS

## 2017-10-14 MED ORDER — ACETAMINOPHEN 10 MG/ML IV SOLN
INTRAVENOUS | Status: DC | PRN
  Administered 2017-10-14: 1000 mg via INTRAVENOUS

## 2017-10-14 MED ORDER — LIDOCAINE HCL (PF) 2 % IJ SOLN
INTRAMUSCULAR | Status: AC
  Filled 2017-10-14: qty 10

## 2017-10-14 MED ORDER — LIDOCAINE HCL (CARDIAC) 20 MG/ML IV SOLN
INTRAVENOUS | Status: DC | PRN
  Administered 2017-10-14: 100 mg via INTRAVENOUS

## 2017-10-14 MED ORDER — MIDAZOLAM HCL 2 MG/2ML IJ SOLN
INTRAMUSCULAR | Status: AC
  Filled 2017-10-14: qty 2

## 2017-10-14 MED ORDER — SUCCINYLCHOLINE CHLORIDE 20 MG/ML IJ SOLN
INTRAMUSCULAR | Status: AC
  Filled 2017-10-14: qty 1

## 2017-10-14 MED ORDER — MORPHINE SULFATE (PF) 2 MG/ML IV SOLN
2.0000 mg | INTRAVENOUS | Status: DC | PRN
  Administered 2017-10-14 – 2017-10-20 (×9): 2 mg via INTRAVENOUS
  Filled 2017-10-14 (×9): qty 1

## 2017-10-14 MED ORDER — PROPOFOL 10 MG/ML IV BOLUS
INTRAVENOUS | Status: DC | PRN
  Administered 2017-10-14: 150 mg via INTRAVENOUS

## 2017-10-14 MED ORDER — PIPERACILLIN-TAZOBACTAM 3.375 G IVPB
3.3750 g | Freq: Three times a day (TID) | INTRAVENOUS | Status: DC
  Administered 2017-10-14 – 2017-10-23 (×27): 3.375 g via INTRAVENOUS
  Filled 2017-10-14 (×28): qty 50

## 2017-10-14 MED ORDER — EPHEDRINE SULFATE 50 MG/ML IJ SOLN
INTRAMUSCULAR | Status: DC | PRN
  Administered 2017-10-14: 10 mg via INTRAVENOUS

## 2017-10-14 MED ORDER — SODIUM CHLORIDE 0.9 % IV SOLN
INTRAVENOUS | Status: DC
  Administered 2017-10-14: 15:00:00 via INTRAVENOUS

## 2017-10-14 MED ORDER — SUGAMMADEX SODIUM 200 MG/2ML IV SOLN
INTRAVENOUS | Status: AC
  Filled 2017-10-14: qty 2

## 2017-10-14 MED ORDER — SODIUM CHLORIDE 0.9 % IV SOLN
INTRAVENOUS | Status: DC | PRN
  Administered 2017-10-14: 50 ug/min via INTRAVENOUS

## 2017-10-14 MED ORDER — ACETAMINOPHEN 10 MG/ML IV SOLN
INTRAVENOUS | Status: AC
  Filled 2017-10-14: qty 100

## 2017-10-14 MED ORDER — ROCURONIUM BROMIDE 50 MG/5ML IV SOLN
INTRAVENOUS | Status: AC
  Filled 2017-10-14: qty 2

## 2017-10-14 MED ORDER — ROCURONIUM BROMIDE 100 MG/10ML IV SOLN
INTRAVENOUS | Status: DC | PRN
  Administered 2017-10-14: 40 mg via INTRAVENOUS
  Administered 2017-10-14: 20 mg via INTRAVENOUS
  Administered 2017-10-14: 10 mg via INTRAVENOUS

## 2017-10-14 MED ORDER — LACTATED RINGERS IV SOLN
INTRAVENOUS | Status: DC | PRN
  Administered 2017-10-14 (×2): via INTRAVENOUS

## 2017-10-14 MED ORDER — EPHEDRINE SULFATE 50 MG/ML IJ SOLN
INTRAMUSCULAR | Status: AC
Start: 2017-10-14 — End: ?
  Filled 2017-10-14: qty 1

## 2017-10-14 MED ORDER — SUCCINYLCHOLINE CHLORIDE 20 MG/ML IJ SOLN
INTRAMUSCULAR | Status: DC | PRN
  Administered 2017-10-14: 100 mg via INTRAVENOUS

## 2017-10-14 MED ORDER — FENTANYL CITRATE (PF) 250 MCG/5ML IJ SOLN
INTRAMUSCULAR | Status: AC
  Filled 2017-10-14: qty 5

## 2017-10-14 MED ORDER — SEVOFLURANE IN SOLN
RESPIRATORY_TRACT | Status: AC
  Filled 2017-10-14: qty 250

## 2017-10-14 MED ORDER — PHENYLEPHRINE HCL 10 MG/ML IJ SOLN
INTRAMUSCULAR | Status: DC | PRN
  Administered 2017-10-14 (×3): 100 ug via INTRAVENOUS

## 2017-10-14 MED ORDER — SODIUM CHLORIDE 0.9 % IV SOLN
INTRAVENOUS | Status: DC | PRN
  Administered 2017-10-14: 40 mL

## 2017-10-14 MED ORDER — IOPAMIDOL (ISOVUE-300) INJECTION 61%
15.0000 mL | INTRAVENOUS | Status: AC
  Administered 2017-10-14 (×2): 15 mL via ORAL

## 2017-10-14 MED ORDER — IOPAMIDOL (ISOVUE-370) INJECTION 76%
75.0000 mL | Freq: Once | INTRAVENOUS | Status: AC | PRN
  Administered 2017-10-14: 75 mL via INTRAVENOUS

## 2017-10-14 MED ORDER — ONDANSETRON HCL 4 MG/2ML IJ SOLN: 4.0000 mg | Freq: Once | INTRAMUSCULAR | Status: DC | PRN

## 2017-10-14 MED ORDER — FENTANYL CITRATE (PF) 100 MCG/2ML IJ SOLN: 25.0000 ug | INTRAMUSCULAR | Status: DC | PRN

## 2017-10-14 MED ORDER — MIDAZOLAM HCL 2 MG/2ML IJ SOLN
INTRAMUSCULAR | Status: DC | PRN
  Administered 2017-10-14: 2 mg via INTRAVENOUS

## 2017-10-14 MED ORDER — DEXAMETHASONE SODIUM PHOSPHATE 10 MG/ML IJ SOLN
INTRAMUSCULAR | Status: AC
  Filled 2017-10-14: qty 1

## 2017-10-14 MED ORDER — SUGAMMADEX SODIUM 200 MG/2ML IV SOLN
INTRAVENOUS | Status: DC | PRN
  Administered 2017-10-14: 177 mg via INTRAVENOUS

## 2017-10-14 SURGICAL SUPPLY — 33 items
BULB RESERV EVAC DRAIN JP 100C (MISCELLANEOUS) IMPLANT
CANISTER SUCT 1200ML W/VALVE (MISCELLANEOUS) ×2 IMPLANT
CHLORAPREP W/TINT 26ML (MISCELLANEOUS) ×2 IMPLANT
DRAIN CHANNEL JP 15F RND 16 (MISCELLANEOUS) IMPLANT
DRAPE LAPAROTOMY 100X77 ABD (DRAPES) ×2 IMPLANT
DRSG OPSITE POSTOP 4X8 (GAUZE/BANDAGES/DRESSINGS) ×2 IMPLANT
ELECT BLADE 6.5 EXT (BLADE) ×2 IMPLANT
ELECT CAUTERY BLADE 6.4 (BLADE) ×2 IMPLANT
ELECT REM PT RETURN 9FT ADLT (ELECTROSURGICAL) ×2
ELECTRODE REM PT RTRN 9FT ADLT (ELECTROSURGICAL) ×1 IMPLANT
GAUZE SPONGE 4X4 12PLY STRL (GAUZE/BANDAGES/DRESSINGS) ×2 IMPLANT
GLOVE BIO SURGEON STRL SZ7.5 (GLOVE) ×4 IMPLANT
GLOVE INDICATOR 8.0 STRL GRN (GLOVE) ×2 IMPLANT
GOWN STRL REUS W/ TWL LRG LVL3 (GOWN DISPOSABLE) ×2 IMPLANT
GOWN STRL REUS W/TWL LRG LVL3 (GOWN DISPOSABLE) ×2
KIT RM TURNOVER STRD PROC AR (KITS) ×2 IMPLANT
LABEL OR SOLS (LABEL) ×2 IMPLANT
LIGASURE IMPACT 36 18CM CVD LR (INSTRUMENTS) ×4 IMPLANT
NS IRRIG 1000ML POUR BTL (IV SOLUTION) ×2 IMPLANT
PACK BASIN MAJOR ARMC (MISCELLANEOUS) IMPLANT
PACK COLON CLEAN CLOSURE (MISCELLANEOUS) IMPLANT
SEPRAFILM MEMBRANE 5X6 (MISCELLANEOUS) ×2 IMPLANT
STAPLER SKIN PROX 35W (STAPLE) ×2 IMPLANT
SUT PDS AB 1 TP1 96 (SUTURE) ×2 IMPLANT
SUT SILK 2 0 SH (SUTURE) ×2 IMPLANT
SUT SILK 3 0 (SUTURE) ×1
SUT SILK 3-0 (SUTURE) ×1
SUT SILK 3-0 18XBRD TIE 12 (SUTURE) ×1 IMPLANT
SUT SILK 3-0 SH-1 18XCR BRD (SUTURE) ×1
SUT VIC AB 3-0 SH 27 (SUTURE) ×1
SUT VIC AB 3-0 SH 27X BRD (SUTURE) ×1 IMPLANT
SUTURE SILK 3-0 SH-1 18XCR BRD (SUTURE) ×1 IMPLANT
TRAY FOLEY W/METER SILVER 16FR (SET/KITS/TRAYS/PACK) IMPLANT

## 2017-10-14 NOTE — Telephone Encounter (Signed)
Please reschedule appointment and treatment 1 week from tomorrow.   Plan is to continue current treatment as planned and I will discuss the results of the PET scan face-to-face at his next clinic appointment.

## 2017-10-14 NOTE — Progress Notes (Signed)
Pt ambulated up in the hall, had a good bm and then fell asleep.  Wife asked that I do not wake him to give him medication.  Pt continues to sleep.  Will not disturb pt until he wakes and calls for medication. Dorna Bloom RN

## 2017-10-14 NOTE — Telephone Encounter (Signed)
[name] made aware

## 2017-10-14 NOTE — Progress Notes (Signed)
Patient ID: Louis Davies, male   DOB: 1951-09-04, 66 y.o.   MRN: [id number]  Just got off the phone with radiologist.  CT scan showing pneumoperitoneum and likely perforation.  Could be ischemic bowel.  Results discussed with general surgery Dr. Everlena Cooper to see patient.  I will empirically start antibiotic  Case also discussed with patient about the findings of these results.  Dr. Loletha Grayer

## 2017-10-14 NOTE — Brief Op Note (Signed)
10/14/2017  6:03 PM  PATIENT:  Louis Davies  66 y.o. male  PRE-OPERATIVE DIAGNOSIS:  Small bowel pneumotosis and free air throughout the abdomen concerning for perforation  POST-OPERATIVE DIAGNOSIS:  pneumotosis  PROCEDURE:  Procedure(s): EXPLORATORY LAPAROTOMY (N/A)  SURGEON:  Surgeon(s) and Role:    Clayburn Pert, MD - Primary  PHYSICIAN ASSISTANT:   ASSISTANTS: none   ANESTHESIA:   general  EBL:  25 mL   BLOOD ADMINISTERED:none  DRAINS: none   LOCAL MEDICATIONS USED:  OTHER liposomal bupivacaine  SPECIMEN:  No Specimen  DISPOSITION OF SPECIMEN:  N/A  COUNTS:  YES  TOURNIQUET:  * No tourniquets in log *  DICTATION: .Dragon Dictation  PLAN OF CARE: Return to inpatient status  PATIENT DISPOSITION:  PACU - hemodynamically stable.   Delay start of Pharmacological VTE agent (>24hrs) due to surgical blood loss or risk of bleeding: no

## 2017-10-14 NOTE — Anesthesia Procedure Notes (Signed)
Procedure Name: Intubation Date/Time: 10/14/2017 4:05 PM Performed by: Nelda Marseille, CRNA Pre-anesthesia Checklist: Patient identified, Patient being monitored, Timeout performed, Emergency Drugs available and Suction available Patient Re-evaluated:Patient Re-evaluated prior to induction Oxygen Delivery Method: Circle system utilized Preoxygenation: Pre-oxygenation with 100% oxygen Induction Type: IV induction Ventilation: Mask ventilation without difficulty Laryngoscope Size: Mac, 3 and McGraph Grade View: Grade III Tube type: Oral Tube size: 7.5 mm Number of attempts: 1 Airway Equipment and Method: Stylet and Video-laryngoscopy Placement Confirmation: ETT inserted through vocal cords under direct vision,  positive ETCO2 and breath sounds checked- equal and bilateral Secured at: 21 cm Tube secured with: Tape Dental Injury: Teeth and Oropharynx as per pre-operative assessment  Difficulty Due To: Difficulty was anticipated and Difficult Airway- due to anterior larynx

## 2017-10-14 NOTE — Anesthesia Post-op Follow-up Note (Signed)
Anesthesia QCDR form completed.        

## 2017-10-14 NOTE — Telephone Encounter (Signed)
Per Dr. Grayland Ormond, patient to keep follow up appointments as scheduled for 12/28. Patient will continue with current treatments as planned, results will be reviewed in detail at his follow up.

## 2017-10-14 NOTE — Care Management Important Message (Signed)
Important Message  Patient Details  Name: Louis Davies MRN: [id number] Date of Birth: May 22, 1951   Medicare Important Message Given:  Yes    Shelbie Ammons, RN 10/14/2017, 7:35 AM

## 2017-10-14 NOTE — Telephone Encounter (Signed)
Louis Davies is asking for results of his PET Scan  CLINICAL DATA:  Subsequent treatment strategy for cutaneous melanoma. Immunotherapy December 2018.  EXAM: NUCLEAR MEDICINE PET WHOLE BODY  TECHNIQUE: 12.6 mCi F-18 FDG was injected intravenously. Full-ring PET imaging was performed from the vertex to the feet after the radiotracer. CT data was obtained and used for attenuation correction and anatomic localization.  FASTING BLOOD GLUCOSE:  Value: 83 mg/dl  COMPARISON:  07/15/2017  FINDINGS: HEAD/NECK  No hypermetabolic activity in the scalp. No hypermetabolic cervical lymph nodes.  Right maxillary molar periapical lucency with adjacent mucoperiosteal thickening in the right maxillary sinus.  CHEST  Dominant left axillary mass measures 9.1 by 5.8 cm on image 117/4 with maximum SUV 21.4 (formerly 16.5). The smaller adjacent lymph nodes demonstrate only very weak metabolic activity. There is a fluid density collection extending from the center of the axillary process towards the skin surface.  A right supraclavicular node measuring 1.1 cm in short axis on image 91/4 previously measured 0.6 cm and currently has a maximum SUV of 3.5 (formerly not appreciably metabolic).  Hypermetabolic right paratracheal, subcarinal, bilateral hilar, and bilateral infrahilar lymph nodes are present. A index subcarinal node 1.2 cm in short axis on image 123/4 (formerly 0.6 cm) with maximum SUV 6.7 (formerly 4.0). Scarring in both lower lobes, with a bandlike nodular region of volume loss in the left lower lobe on image 147/4 with low-grade associated metabolic activity, probably a all from atelectasis. Mild bilateral airway thickening.  ABDOMEN/PELVIS  No abnormal hypermetabolic activity within the liver, pancreas, adrenal glands, or spleen. No hypermetabolic lymph nodes in the abdomen or pelvis.  A small hypodense lesion in the vicinity of the falciform ligament is not  appreciably hypermetabolic and appear stable from prior, possibly focal fatty infiltration of the liver. Dependent density in the gallbladder favor sludge.  Rectal air- fluid level favoring diarrheal process. Borderline dilated loops of pelvic small bowel without a well-defined transition point.  Prior accentuated activity in the anorectal junction has resolved.  SKELETON  Expansile left iliac bone lesion with similar size and morphology compared the prior exam, 2.0 cm in long axis, maximum SUV 2.5 (previously 3.1).  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  EXTREMITIES  No abnormal hypermetabolic activity in the lower extremities.  IMPRESSION: 1. The dominant left axillary mass is essentially stable in size by my measurements, and has a maximum SUV of 21.4 (formerly 16.5). Also the mediastinal and hilar/infrahilar lymph nodes in the chest demonstrate mild enlargement and moderate increased in standard uptake value. Possibilities include mild progression of versus immunotherapy-related pseudoprogression, and surveillance is recommended. As before, left axillary mass has a cutaneous extension inferiorly and may possibly be draining to the skin. 2. Roughly stable appearance of the expansile left iliac bone lesion, with low-grade metabolic activity. 3. No additional involvement identified. 4. Borderline dilated small bowel, significance uncertain. No definite hypermetabolic activity within or along the small bowel to further suggest metastatic disease to the bowel. There is also an air- fluid level in the rectum compatible with diarrheal process. 5. Increased atelectasis along the left lung base. 6. Other imaging findings of potential clinical significance: Periapical lucency along a right maxillary molar. 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 Airway thickening is present, suggesting bronchitis or reactive airways disease.   Electronically Signed   By: Van Clines M.D.   On: 10/09/2017 15:07

## 2017-10-14 NOTE — Telephone Encounter (Addendum)
Louis Davies called and wanted [name] to know that Louis Davies is going to surgery now to have part of his intestine removed and will definitely not be here for chemotherapy Friday  IMPRESSION: Significantly increased small bowel dilatation is now noted concerning for distal small bowel obstruction. There is also noted pneumatosis involving dilated small bowel loop in the pelvis concerning for ischemic bowel. Pneumoperitoneum is noted in epigastric region as well concerning for rupture of hollow viscus. Immediate surgical consultation is recommended. Critical Value/emergent results were called by telephone at the time of interpretation on 10/14/2017 at 2:30 pm to Dr. Loletha Grayer , who verbally acknowledged these results.  Multiple small low densities are noted within the hepatic parenchyma which may simply represent cysts, but metastatic disease cannot be excluded given the history of melanoma.   Electronically Signed   By: Marijo Conception, M.D.   On: 10/14/2017 14:32

## 2017-10-14 NOTE — Op Note (Signed)
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 with endotracheal tube  Assistant: None  Procedure Details  The patient was seen again in the Holding Room. The benefits, complications, treatment options, and expected outcomes were discussed with the patient. The risks of bleeding, infection, recurrence of symptoms, failure to resolve symptoms,  bowel injury, any of which could require further surgery were reviewed with the patient.   The patient was taken to Operating Room, identified as Louis Davies and the procedure verified.  A Time Out was held and the above information confirmed.  Prior to the induction of general anesthesia, antibiotic prophylaxis was administered. VTE prophylaxis was in place. General endotracheal anesthesia was then administered and tolerated well. After the induction, the abdomen was prepped with Chloraprep and draped in the sterile fashion. The patient was positioned in the supine position.  The procedure began with a midline incision with a 10 blade scalpel.  Using Bovie electrocautery was taken of the level of the fascia.  The fascia was bluntly entered into and then opened up on its entire length with electrocautery protection of the deeper structures.  The entire abdominal contents were then eviscerated.  The small and large bowel were noted to be extensively dilated.  There was inflammatory fluid within the pelvis but no evidence of succus.  The small bowel was run from ligament of Treitz to the terminal ileum.  There was no obvious perforation.  The colon was then run from the cecum to the rectum again without obvious evidence of perforation.  At this point the stomach was visualized and the  NG tube was palpated within it without obvious perforation.  A small bowel being dilated and filled with liquid was then milked approximately out the placed NG tube.  It was examined meticulously at each pass looking for any evidence of leaking or bubbling to represent the site of the free air seen on CT scan.  There was no evidence of perforation while approximately 3.5 L of gastric contents were milked back to the NG tube.  The omentum was lifted and noted to be somewhat adhered to the distal transverse colon.  The splenic flexure was mobilized but no evidence of stricture or perforation was identified.  The proximal transverse colon and hepatic flexure were then addressed and mobilized to allow visualization of the duodenum.  No obvious perforation was identified.  Sigmoid colon was again evaluated without evidence of perforation.  The entirety of the small bowel was run at 1 additional time and no source for the CT evidenced free air was identified and the entire bowel appeared viable.  At this point the decision was made to close.  The omentum was draped over the entire the abdomen.  A sheet of Seprafilm was placed over the omentum and the abdomen was closed with a looped #1 PDS using a single stitch in a running fashion to close the midline.  The abdominal wall was then localized with liposomal bupivacaine.  The skin was then reapproximated with staples and a honeycomb dressing was secured over this.  The patient tolerated the procedure well.  There were no immediate complications and all counts were correct at the end of the procedure.  He was awoken from general endotracheal anesthesia and transferred to the PACU in good condition.  Findings: Dilated bowel without evidence of perforation   Estimated Blood Loss: 40 mL         Drains: None         Specimens: None          Complications: None                  Condition: Good   Clayburn Pert, MD, FACS

## 2017-10-14 NOTE — Telephone Encounter (Signed)
Patient is in hospital and will not make his appointment most likely

## 2017-10-14 NOTE — Transfer of Care (Signed)
Immediate Anesthesia Transfer of Care Note  Patient: Louis Davies  Procedure(s) Performed: EXPLORATORY LAPAROTOMY (N/A )  Patient Location: PACU  Anesthesia Type:General  Level of Consciousness: sedated  Airway & Oxygen Therapy: Patient Spontanous Breathing and Patient connected to face mask oxygen  Post-op Assessment: Report given to RN and Post -op Vital signs reviewed and stable  Post vital signs: Reviewed and stable  Last Vitals:  Vitals:   10/14/17 0429 10/14/17 1526  BP: 129/69 126/74  Pulse: 63 74  Resp: 18   Temp: 36.9 C 36.9 C  SpO2: 93% 97%    Last Pain:  Vitals:   10/14/17 1526  TempSrc: Oral  PainSc:       Patients Stated Pain Goal: 3 (79/72/82 0601)  Complications: No apparent anesthesia complications

## 2017-10-14 NOTE — Anesthesia Preprocedure Evaluation (Addendum)
Anesthesia Evaluation  Patient identified by MRN, date of birth, ID band Patient awake    Reviewed: Allergy & Precautions, NPO status , Patient's Chart, lab work & pertinent test results, reviewed documented beta blocker date and time   History of Anesthesia Complications (+) PONV and history of anesthetic complications  Airway Mallampati: II  TM Distance: >3 FB     Dental  (+) Chipped, Missing   Pulmonary           Cardiovascular      Neuro/Psych    GI/Hepatic   Endo/Other    Renal/GU      Musculoskeletal  (+) Arthritis ,   Abdominal   Peds  Hematology  (+) anemia ,   Anesthesia Other Findings One tooth missing.  Reproductive/Obstetrics                            Anesthesia Physical Anesthesia Plan  ASA: III  Anesthesia Plan: General   Post-op Pain Management:    Induction: Intravenous  PONV Risk Score and Plan:   Airway Management Planned: Oral ETT  Additional Equipment:   Intra-op Plan:   Post-operative Plan:   Informed Consent: I have reviewed the patients History and Physical, chart, labs and discussed the procedure including the risks, benefits and alternatives for the proposed anesthesia with the patient or authorized representative who has indicated his/her understanding and acceptance.     Plan Discussed with: CRNA  Anesthesia Plan Comments:         Anesthesia Quick Evaluation

## 2017-10-14 NOTE — Progress Notes (Signed)
CC: Abdominal pain Subjective: Patient reports she had acute worsening in his abdominal discomfort and distention overnight with the return of his nausea.  He states he is continue to have bowel function but he feels much worse this morning.  Objective: Vital signs in last 24 hours: Temp:  [97.5 F (36.4 C)-98.5 F (36.9 C)] 98.5 F (36.9 C) (12/26 0429) Pulse Rate:  [63-78] 63 (12/26 0429) Resp:  [18] 18 (12/26 0429) BP: (128-129)/(67-85) 129/69 (12/26 0429) SpO2:  [93 %-96 %] 93 % (12/26 0429) Last BM Date: 10/13/17  Intake/Output from previous day: 12/25 0701 - 12/26 0700 In: 1955 [P.O.:480; I.V.:1475] Out: 6 [Urine:3; Stool:3] Intake/Output this shift: No intake/output data recorded.  Physical exam:  General: No acute distress Chest: Clear to auscultation Heart: Regular rhythm Abdomen: Soft, mildly tender to palpation in all quadrants, moderately distended and tympanic  Lab Results: CBC  No results for input(s): WBC, HGB, HCT, PLT in the last 72 hours. BMET Recent Labs    10/12/17 0945 10/13/17 0617 10/14/17 0551  NA 142  --  137  K 3.0* 3.2* 3.6  CL 103  --  104  CO2 28  --  27  GLUCOSE 116*  --  108*  BUN 16  --  21*  CREATININE 0.83  --  0.77  CALCIUM 9.1  --  8.6*   PT/INR No results for input(s): LABPROT, INR in the last 72 hours. ABG No results for input(s): PHART, HCO3 in the last 72 hours.  Invalid input(s): PCO2, PO2  Studies/Results: Dg Abd 1 View  Result Date: 10/14/2017 CLINICAL DATA:  66 year old male with a history of abdominal distention/small bowel obstruction EXAM: ABDOMEN - 1 VIEW COMPARISON:  10/13/2017, 10/12/2017, 10/10/2017, PET-CT 10/09/2017 FINDINGS: Persisting distension of small bowel and colonic bowel loops. Upper abdomen not imaged. No radiopaque foreign bodies. No acute bony abnormality. IMPRESSION: Persisting bowel obstruction. If there is concern for acute intraabdominal process, recommend contrast-enhanced CT.  Electronically Signed   By: Corrie Mckusick D.O.   On: 10/14/2017 07:50   Dg Abd 1 View  Result Date: 10/13/2017 CLINICAL DATA:  Ileus EXAM: ABDOMEN - 1 VIEW COMPARISON:  10/12/2017 FINDINGS: Gaseous distension of large and small bowel again noted. No free air organomegaly. No acute bony abnormality. IMPRESSION: Stable gaseous distention of large and small bowel, most compatible with ileus. Electronically Signed   By: Rolm Baptise M.D.   On: 10/13/2017 07:37    Anti-infectives: Anti-infectives (From admission, onward)   None      Assessment/Plan:  66 year old male with metastatic melanoma what appears to be a worsening in his ileus versus obstruction.  Discussed with the primary team given this acute change in his symptoms that we would be better suited with cross-sectional imaging.  We will obtain a CT scan of the abdomen and pelvis with contrast to evaluate whether not there is a mechanical obstruction.  Surgery will continue to follow with you and the plan will be pending the scan.  Brodie Scovell T. Adonis Huguenin, MD, [facility] General Surgeon [facility]  [facility] [phone] [facility] [phone] 10/14/2017

## 2017-10-14 NOTE — Progress Notes (Signed)
  Called by primary team secondary to CT findings of pneumoperitoneum.  Reviewed images that show small bowel pneumotosis and free air throughout the abdomen concerning for perforation.  Patient examined, and although he states he is in less pain he is distended and tympanic with guarding.  Discussed the findings with the patient and the indicated operation of an exploratory laparotomy. Discussed the possible risks and benefits as well as anticipated recovery.  All questions answered to his satisfaction. We will proceed emergently the OR for an exploratory laparotomy.  Clayburn Pert, MD [facility]  [facility] [phone] [facility] [phone]

## 2017-10-14 NOTE — Telephone Encounter (Signed)
Schedule message has been sent.

## 2017-10-14 NOTE — Progress Notes (Signed)
Patient ID: Louis Davies, male   DOB: 05/09/1951, 66 y.o.   MRN: [id number]  [employer] PROGRESS NOTE  YOSHIMI SARR VEL:[id number] DOB: [birth year] DOA: 10/10/2017 PCP: [facility], [place] [facility]  HPI/Subjective: Patient developed worsening abdominal pain and dry heaves and abdominal distention starting from last night.  Patient not feeling well.  He states he did have a bowel movement last night.  Objective: Vitals:   10/13/17 2021 10/14/17 0429  BP: 128/67 129/69  Pulse: 63 63  Resp: 18 18  Temp: 98.4 F (36.9 C) 98.5 F (36.9 C)  SpO2: 95% 93%    Filed Weights   10/10/17 1112 10/10/17 1803 10/12/17 1455  Weight: 91.6 kg (202 lb) 89.4 kg (197 lb 3.2 oz) 88.5 kg (195 lb 1.6 oz)    ROS: Review of Systems  Constitutional: Negative for chills and fever.  Eyes: Negative for blurred vision.  Respiratory: Negative for cough and shortness of breath.   Cardiovascular: Negative for chest pain.  Gastrointestinal: Positive for abdominal pain and nausea. Negative for constipation, diarrhea and vomiting.  Genitourinary: Negative for dysuria.  Musculoskeletal: Negative for joint pain.  Neurological: Negative for dizziness and headaches.   Exam: Physical Exam  Constitutional: He is oriented to person, place, and time.  HENT:  Nose: No mucosal edema.  Mouth/Throat: No oropharyngeal exudate or posterior oropharyngeal edema.  Eyes: Conjunctivae, EOM and lids are normal. Pupils are equal, round, and reactive to light.  Neck: No JVD present. Carotid bruit is not present. No edema present. No thyroid mass and no thyromegaly present.  Cardiovascular: S1 normal and S2 normal. Exam reveals no gallop.  No murmur heard. Pulses:      Dorsalis pedis pulses are 2+ on the right side, and 2+ on the left side.  Respiratory: No respiratory distress. He has no wheezes. He has no rhonchi. He has no rales.  GI: Soft. He exhibits distension. Bowel sounds are decreased. There is tenderness in the  left lower quadrant.  Musculoskeletal:       Right ankle: He exhibits no swelling.       Left ankle: He exhibits no swelling.  Lymphadenopathy:    He has no cervical adenopathy.  Neurological: He is alert and oriented to person, place, and time. No cranial nerve deficit.  Skin: Skin is warm. No rash noted. Nails show no clubbing.  Psychiatric: He has a normal mood and affect.      Data Reviewed: Basic Metabolic Panel: Recent Labs  Lab 10/10/17 1114 10/10/17 1807 10/11/17 0606 10/11/17 1841 10/12/17 0945 10/13/17 0617 10/14/17 0551  NA 139  --  143  --  142  --  137  K 3.5  --  2.7* 3.3* 3.0* 3.2* 3.6  CL 104  --  106  --  103  --  104  CO2 25  --  27  --  28  --  27  GLUCOSE 117*  --  105*  --  116*  --  108*  BUN 16  --  16  --  16  --  21*  CREATININE 0.77 0.94 0.89  --  0.83  --  0.77  CALCIUM 9.6  --  8.6*  --  9.1  --  8.6*  MG  --   --  1.9  --   --  1.8  --   PHOS  --   --   --   --   --  3.9  --    Liver Function Tests:  Recent Labs  Lab 10/10/17 1114  AST 25  ALT 22  ALKPHOS 74  BILITOT 0.9  PROT 7.7  ALBUMIN 4.3   Recent Labs  Lab 10/10/17 1114  LIPASE 33   CBC: Recent Labs  Lab 10/10/17 1114 10/10/17 1807  WBC 7.8 6.5  HGB 14.0 13.4  HCT 43.2 41.1  MCV 77.3* 78.2*  PLT 249 220     CBG: Recent Labs  Lab 10/09/17 0841  GLUCAP 83     Studies: Dg Abd 1 View  Result Date: 10/14/2017 CLINICAL DATA:  66 year old male with a history of abdominal distention/small bowel obstruction EXAM: ABDOMEN - 1 VIEW COMPARISON:  10/13/2017, 10/12/2017, 10/10/2017, PET-CT 10/09/2017 FINDINGS: Persisting distension of small bowel and colonic bowel loops. Upper abdomen not imaged. No radiopaque foreign bodies. No acute bony abnormality. IMPRESSION: Persisting bowel obstruction. If there is concern for acute intraabdominal process, recommend contrast-enhanced CT. Electronically Signed   By: Corrie Mckusick D.O.   On: 10/14/2017 07:50   Dg Abd 1  View  Result Date: 10/13/2017 CLINICAL DATA:  Ileus EXAM: ABDOMEN - 1 VIEW COMPARISON:  10/12/2017 FINDINGS: Gaseous distension of large and small bowel again noted. No free air organomegaly. No acute bony abnormality. IMPRESSION: Stable gaseous distention of large and small bowel, most compatible with ileus. Electronically Signed   By: Rolm Baptise M.D.   On: 10/13/2017 07:37    Scheduled Meds: . enoxaparin (LOVENOX) injection  40 mg Subcutaneous Q24H  . lactulose  30 g Oral BID  . multivitamin with minerals  1 tablet Oral Daily  . sodium chloride flush  3 mL Intravenous Q12H   Continuous Infusions: . lactated ringers 1,000 mL with potassium chloride 20 mEq infusion 50 mL/hr at 10/14/17 0500    Assessment/Plan:  1. Abdominal pain, dry heaves and abdominal distention.  Abdominal x-ray shows persistent obstruction.  Case discussed with general surgery and we will obtain a CT scan of the abdomen and pelvis.  Patient currently n.p.o. 2. Hypokalemia.  This has been replaced. 3. History of metastatic melanoma on immunotherapy  Code Status:     Code Status Orders  (From admission, onward)        Start     Ordered   10/10/17 1809  Full code  Continuous     10/10/17 1808    Code Status History    Date Active Date Inactive Code Status Order ID Comments User Context   This patient has a current code status but no historical code status.    Advance Directive Documentation     Most Recent Value  Type of Advance Directive  Healthcare Power of Attorney  Pre-existing out of facility DNR order (yellow form or pink MOST form)  No data  "MOST" Form in Place?  No data      Disposition Plan: We will need to have less symptoms prior to disposition  Consultants:  General surgery  Time spent: 25 minutes  [place]

## 2017-10-14 NOTE — Progress Notes (Signed)
Pharmacy Antibiotic Note  Louis Davies is a 66 y.o. male admitted on 10/10/2017 with intra abdominal infection.  Pharmacy has been consulted for zosyn dosing.  Plan: Zosyn 3.375g IV q8h (4 hour infusion).  Height: 5\' 11"  (180.3 cm) Weight: 195 lb 1.6 oz (88.5 kg)(bed scale) IBW/kg (Calculated) : 75.3  Temp (24hrs), Avg:98.5 F (36.9 C), Min:98.4 F (36.9 C), Max:98.5 F (36.9 C)  Recent Labs  Lab 10/10/17 1114 10/10/17 1807 10/11/17 0606 10/12/17 0945 10/14/17 0551  WBC 7.8 6.5  --   --   --   CREATININE 0.77 0.94 0.89 0.83 0.77    Estimated Creatinine Clearance: 94.1 mL/min (by C-G formula based on SCr of 0.77 mg/dL).    No Known Allergies  Antimicrobials this admission: Anti-infectives (From admission, onward)   Start     Dose/Rate Route Frequency Ordered Stop   10/14/17 1515  piperacillin-tazobactam (ZOSYN) IVPB 3.375 g     3.375 g 12.5 mL/hr over 240 Minutes Intravenous Every 8 hours 10/14/17 1507        Microbiology results: No results found for this or any previous visit (from the past 240 hour(s)).  Thank you for allowing pharmacy to be a part of this patient's care.  Donna Christen Paxton Kanaan 10/14/2017 3:07 PM

## 2017-10-15 ENCOUNTER — Telehealth: Payer: Self-pay | Admitting: Oncology

## 2017-10-15 ENCOUNTER — Inpatient Hospital Stay: Payer: Medicare Other

## 2017-10-15 ENCOUNTER — Encounter: Payer: Self-pay | Admitting: General Surgery

## 2017-10-15 DIAGNOSIS — Z806 Family history of leukemia: Secondary | ICD-10-CM

## 2017-10-15 DIAGNOSIS — C799 Secondary malignant neoplasm of unspecified site: Secondary | ICD-10-CM

## 2017-10-15 DIAGNOSIS — R5383 Other fatigue: Secondary | ICD-10-CM

## 2017-10-15 DIAGNOSIS — R109 Unspecified abdominal pain: Secondary | ICD-10-CM

## 2017-10-15 DIAGNOSIS — R531 Weakness: Secondary | ICD-10-CM

## 2017-10-15 DIAGNOSIS — D649 Anemia, unspecified: Secondary | ICD-10-CM

## 2017-10-15 DIAGNOSIS — K634 Enteroptosis: Secondary | ICD-10-CM

## 2017-10-15 DIAGNOSIS — Z9221 Personal history of antineoplastic chemotherapy: Secondary | ICD-10-CM

## 2017-10-15 DIAGNOSIS — M129 Arthropathy, unspecified: Secondary | ICD-10-CM

## 2017-10-15 DIAGNOSIS — Z8041 Family history of malignant neoplasm of ovary: Secondary | ICD-10-CM

## 2017-10-15 DIAGNOSIS — C439 Malignant melanoma of skin, unspecified: Secondary | ICD-10-CM

## 2017-10-15 LAB — BASIC METABOLIC PANEL
Anion gap: 8 (ref 5–15)
BUN: 18 mg/dL (ref 6–20)
CHLORIDE: 103 mmol/L (ref 101–111)
CO2: 26 mmol/L (ref 22–32)
CREATININE: 0.86 mg/dL (ref 0.61–1.24)
Calcium: 7.9 mg/dL — ABNORMAL LOW (ref 8.9–10.3)
GFR calc Af Amer: >60 mL/min (ref >60)
GFR calc non Af Amer: >60 mL/min (ref >60)
GLUCOSE: 112 mg/dL — AB (ref 65–99)
POTASSIUM: 3.7 mmol/L (ref 3.5–5.1)
SODIUM: 137 mmol/L (ref 135–145)

## 2017-10-15 LAB — CBC
HEMATOCRIT: 38.3 % — AB (ref 40.0–52.0)
HEMOGLOBIN: 12.5 g/dL — AB (ref 13.0–18.0)
MCH: 25.3 pg — AB (ref 26.0–34.0)
MCHC: 32.6 g/dL (ref 32.0–36.0)
MCV: 77.8 fL — AB (ref 80.0–100.0)
Platelets: 209 10*3/uL (ref 150–440)
RBC: 4.92 MIL/uL (ref 4.40–5.90)
RDW: 16 % — ABNORMAL HIGH (ref 11.5–14.5)
WBC: 8.5 10*3/uL (ref 3.8–10.6)

## 2017-10-15 LAB — GLUCOSE, CAPILLARY
GLUCOSE-CAPILLARY: 140 mg/dL — AB (ref 65–99)
GLUCOSE-CAPILLARY: 107 mg/dL — AB (ref 65–99)
GLUCOSE-CAPILLARY: 145 mg/dL — AB (ref 65–99)

## 2017-10-15 MED ORDER — FAT EMULSION 20 % IV EMUL
250.0000 mL | INTRAVENOUS | Status: AC
  Administered 2017-10-15: 18:00:00 250 mL via INTRAVENOUS
  Filled 2017-10-15: qty 250

## 2017-10-15 MED ORDER — TRACE MINERALS CR-CU-MN-SE-ZN 10-1000-500-60 MCG/ML IV SOLN
INTRAVENOUS | Status: AC
  Administered 2017-10-15: 18:00:00 via INTRAVENOUS
  Filled 2017-10-15: qty 960

## 2017-10-15 MED ORDER — INSULIN ASPART 100 UNIT/ML ~~LOC~~ SOLN
0.0000 [IU] | SUBCUTANEOUS | Status: DC
  Administered 2017-10-15: 21:00:00 2 [IU] via SUBCUTANEOUS
  Administered 2017-10-16: 22:00:00 3 [IU] via SUBCUTANEOUS
  Administered 2017-10-16 – 2017-10-18 (×11): 2 [IU] via SUBCUTANEOUS
  Administered 2017-10-18 (×2): 3 [IU] via SUBCUTANEOUS
  Administered 2017-10-18: 2 [IU] via SUBCUTANEOUS
  Administered 2017-10-18: 06:00:00 3 [IU] via SUBCUTANEOUS
  Administered 2017-10-19 – 2017-10-22 (×19): 2 [IU] via SUBCUTANEOUS
  Administered 2017-10-22 – 2017-10-23 (×3): 3 [IU] via SUBCUTANEOUS
  Administered 2017-10-23 (×2): 2 [IU] via SUBCUTANEOUS
  Administered 2017-10-23 – 2017-10-24 (×3): 3 [IU] via SUBCUTANEOUS
  Administered 2017-10-24 – 2017-10-25 (×6): 2 [IU] via SUBCUTANEOUS
  Filled 2017-10-15 (×47): qty 1

## 2017-10-15 MED ORDER — SODIUM CHLORIDE 0.9% FLUSH
10.0000 mL | INTRAVENOUS | Status: DC | PRN
  Administered 2017-10-15: 22:00:00 10 mL
  Filled 2017-10-15: qty 40

## 2017-10-15 MED ORDER — SODIUM CHLORIDE 0.9% FLUSH
10.0000 mL | Freq: Two times a day (BID) | INTRAVENOUS | Status: DC
  Administered 2017-10-15 – 2017-10-25 (×15): 10 mL
  Administered 2017-10-26: 20 mL

## 2017-10-15 NOTE — Progress Notes (Signed)
Peripherally Inserted Central Catheter/Midline Placement  The IV Nurse has discussed with the patient and/or persons authorized to consent for the patient, the purpose of this procedure and the potential benefits and risks involved with this procedure.  The benefits include less needle sticks, lab draws from the catheter, and the patient may be discharged home with the catheter. Risks include, but not limited to, infection, bleeding, blood clot (thrombus formation), and puncture of an artery; nerve damage and irregular heartbeat and possibility to perform a PICC exchange if needed/ordered by physician.  Alternatives to this procedure were also discussed.  Bard Power PICC patient education guide, fact sheet on infection prevention and patient information card has been provided to patient /or left at bedside.    PICC/Midline Placement Documentation  PICC Double Lumen 10/15/17 PICC Right Brachial 43 cm 0 cm (Active)  Indication for Insertion or Continuance of Line Administration of hyperosmolar/irritating solutions (i.e. TPN, Vancomycin, etc.) 10/15/2017 12:55 PM  Exposed Catheter (cm) 0 cm 10/15/2017 12:55 PM  Site Assessment Clean;Dry;Intact 10/15/2017 12:55 PM  Lumen #1 Status Flushed;Saline locked;Blood return noted 10/15/2017 12:55 PM  Lumen #2 Status Flushed;Saline locked;Blood return noted 10/15/2017 12:55 PM  Dressing Type Transparent 10/15/2017 12:55 PM  Dressing Status Clean;Dry;Intact;Antimicrobial disc in place 10/15/2017 12:55 PM  Dressing Change Due 10/22/17 10/15/2017 12:55 PM       [place] Payment 10/15/2017, 12:56 PM

## 2017-10-15 NOTE — Progress Notes (Signed)
Patient ID: Louis Davies, male   DOB: 10/30/1950, 66 y.o.   MRN: [id number]  [facility] PROGRESS NOTE  Louis Davies NO [id number] DOB: 1951/03/15 DOA: 10/10/2017 PCP: [facility]  HPI/Subjective: Patient feeling a little bit better than yesterday.  Still has nausea but no vomiting.  Some abdominal pain after surgery.  Objective: Vitals:   10/15/17 0424 10/15/17 1333  BP: 130/68 (!) 143/69  Pulse: 98 92  Resp: 20 16  Temp: 99.3 F (37.4 C)   SpO2: 92% 94%    Filed Weights   10/10/17 1112 10/10/17 1803 10/12/17 1455  Weight: 91.6 kg (202 lb) 89.4 kg (197 lb 3.2 oz) 88.5 kg (195 lb 1.6 oz)    ROS: Review of Systems  Constitutional: Negative for chills and fever.  Eyes: Negative for blurred vision.  Respiratory: Negative for cough and shortness of breath.   Cardiovascular: Negative for chest pain.  Gastrointestinal: Positive for abdominal pain and nausea. Negative for constipation, diarrhea and vomiting.  Genitourinary: Negative for dysuria.  Musculoskeletal: Negative for joint pain.  Neurological: Negative for dizziness and headaches.   Exam: Physical Exam  Constitutional: He is oriented to person, place, and time.  HENT:  Nose: No mucosal edema.  Mouth/Throat: No oropharyngeal exudate or posterior oropharyngeal edema.  Eyes: Conjunctivae, EOM and lids are normal. Pupils are equal, round, and reactive to light.  Neck: No JVD present. Carotid bruit is not present. No edema present. No thyroid mass and no thyromegaly present.  Cardiovascular: S1 normal and S2 normal. Exam reveals no gallop.  No murmur heard. Pulses:      Dorsalis pedis pulses are 2+ on the right side, and 2+ on the left side.  Respiratory: No respiratory distress. He has decreased breath sounds in the right lower field and the left lower field. He has no wheezes. He has no rhonchi. He has no rales.  GI: Soft. He exhibits distension. Bowel sounds are decreased. There is generalized  tenderness.  Musculoskeletal:       Right ankle: He exhibits no swelling.       Left ankle: He exhibits no swelling.  Lymphadenopathy:    He has no cervical adenopathy.  Neurological: He is alert and oriented to person, place, and time. No cranial nerve deficit.  Skin: Skin is warm. No rash noted. Nails show no clubbing.  Psychiatric: He has a normal mood and affect.      Data Reviewed: Basic Metabolic Panel: Recent Labs  Lab 10/10/17 1114 10/10/17 1807 10/11/17 0606 10/11/17 1841 10/12/17 0945 10/13/17 0617 10/14/17 0551 10/15/17 0622  NA 139  --  143  --  142  --  137 137  K 3.5  --  2.7* 3.3* 3.0* 3.2* 3.6 3.7  CL 104  --  106  --  103  --  104 103  CO2 25  --  27  --  28  --  27 26  GLUCOSE 117*  --  105*  --  116*  --  108* 112*  BUN 16  --  16  --  16  --  21* 18  CREATININE 0.77 0.94 0.89  --  0.83  --  0.77 0.86  CALCIUM 9.6  --  8.6*  --  9.1  --  8.6* 7.9*  MG  --   --  1.9  --   --  1.8  --   --   PHOS  --   --   --   --   --  3.9  --   --    Liver Function Tests: Recent Labs  Lab 10/10/17 1114  AST 25  ALT 22  ALKPHOS 74  BILITOT 0.9  PROT 7.7  ALBUMIN 4.3   Recent Labs  Lab 10/10/17 1114  LIPASE 33   CBC: Recent Labs  Lab 10/10/17 1114 10/10/17 1807 10/15/17 0622  WBC 7.8 6.5 8.5  HGB 14.0 13.4 12.5*  HCT 43.2 41.1 38.3*  MCV 77.3* 78.2* 77.8*  PLT 249 220 209     CBG: Recent Labs  Lab 10/09/17 0841 10/15/17 1251  GLUCAP 83 140*     Studies: Dg Abd 1 View  Result Date: 10/14/2017 CLINICAL DATA:  66 year old male with a history of abdominal distention/small bowel obstruction EXAM: ABDOMEN - 1 VIEW COMPARISON:  10/13/2017, 10/12/2017, 10/10/2017, PET-CT 10/09/2017 FINDINGS: Persisting distension of small bowel and colonic bowel loops. Upper abdomen not imaged. No radiopaque foreign bodies. No acute bony abnormality. IMPRESSION: Persisting bowel obstruction. If there is concern for acute intraabdominal process, recommend  contrast-enhanced CT. Electronically Signed   By: Corrie Mckusick D.O.   On: 10/14/2017 07:50   Ct Abdomen Pelvis W Contrast  Result Date: 10/14/2017 CLINICAL DATA:  Acute generalized abdominal pain and distention. EXAM: CT ABDOMEN AND PELVIS WITH CONTRAST TECHNIQUE: Multidetector CT imaging of the abdomen and pelvis was performed using the standard protocol following bolus administration of intravenous contrast. CONTRAST:  8mL ISOVUE-370 IOPAMIDOL (ISOVUE-370) INJECTION 76% COMPARISON:  PET scan of October 09, 2017. Radiographs of same day. FINDINGS: Lower chest: Mild bilateral posterior basilar subsegmental atelectasis. Hepatobiliary: No gallstones are noted. Multiple small low densities are noted which are stable compared to prior exam and most consistent with hepatic cysts, although metastatic disease cannot be excluded given the history of melanoma. Pancreas: Unremarkable. No pancreatic ductal dilatation or surrounding inflammatory changes. Spleen: Normal in size without focal abnormality. Adrenals/Urinary Tract: Adrenal glands are unremarkable. Kidneys are normal, without renal calculi, focal lesion, or hydronephrosis. Bladder is unremarkable. Stomach/Bowel: The stomach appears normal. There is significantly increased small bowel dilatation concerning for distal small bowel obstruction. No colonic dilatation is noted. Stool is noted in the colon. Pneumatosis is seen involving a small bowel loop in the pelvis best seen on image number 73 of series 2 concerning for ischemic bowel. Vascular/Lymphatic: No significant vascular findings are present. No enlarged abdominal or pelvic lymph nodes. Reproductive: Prostate is unremarkable. Other: Pneumoperitoneum is noted in the epigastric region concerning for rupture of hollow viscus. No abnormal fluid collection is noted. Musculoskeletal: No acute or significant osseous findings. IMPRESSION: Significantly increased small bowel dilatation is now noted concerning for  distal small bowel obstruction. There is also noted pneumatosis involving dilated small bowel loop in the pelvis concerning for ischemic bowel. Pneumoperitoneum is noted in epigastric region as well concerning for rupture of hollow viscus. Immediate surgical consultation is recommended. Critical Value/emergent results were called by telephone at the time of interpretation on 10/14/2017 at 2:30 pm to Dr. Loletha Grayer , who verbally acknowledged these results. Multiple small low densities are noted within the hepatic parenchyma which may simply represent cysts, but metastatic disease cannot be excluded given the history of melanoma. Electronically Signed   By: Marijo Conception, M.D.   On: 10/14/2017 14:32   Dg Chest Port 1 View  Result Date: 10/15/2017 CLINICAL DATA:  Confirm line placement. EXAM: PORTABLE CHEST 1 VIEW COMPARISON:  No recent chest x-ray for review. FINDINGS: The lungs are mildly hypoinflated but clear. The heart is normal  in size. The pulmonary vascularity is not engorged. A right-sided PICC line has its tip terminating over the midportion of the SVC. There is an esophagogastric tube present whose tip in proximal port project below the GE junction. IMPRESSION: Positioning of the right-sided PICC line and the esophagogastric tube as described. Mild hypoinflation. No definite acute cardiopulmonary abnormality. Electronically Signed   By: David  Martinique M.D.   On: 10/15/2017 13:18    Scheduled Meds: . enoxaparin (LOVENOX) injection  40 mg Subcutaneous Q24H  . insulin aspart  0-15 Units Subcutaneous Q4H  . sodium chloride flush  10-40 mL Intracatheter Q12H  . sodium chloride flush  3 mL Intravenous Q12H   Continuous Infusions: . Marland KitchenTPN (CLINIMIX-E) Adult    . fat emulsion    . lactated ringers 1,000 mL with potassium chloride 20 mEq infusion 50 mL/hr at 10/15/17 0736  . piperacillin-tazobactam (ZOSYN)  IV 3.375 g (10/15/17 1611)    Assessment/Plan:  1. Intestinal perforation with  abdominal pain, dry heaves and abdominal distention.  Case discussed with general surgery.  The patient was taken to the operating room yesterday afternoon with no obvious source of the intestinal perforation.  Patient started on TPN.  Patient on empiric antibiotics.  Watch clinically at this point.  Immunotherapy has a rare side effect of intestinal perforation.  Not sure if this is the etiology of things. 2. Hypokalemia.  This will be replaced in the TPN 3. 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  Code Status:     Code Status Orders  (From admission, onward)        Start     Ordered   10/10/17 1809  Full code  Continuous     10/10/17 1808    Code Status History    Date Active Date Inactive Code Status Order ID Comments User Context   This patient has a current code status but no historical code status.    Advance Directive Documentation     Most Recent Value  Type of Advance Directive  Healthcare Power of Attorney  Pre-existing out of facility DNR order (yellow form or pink MOST form)  No data  "MOST" Form in Place?  No data      Disposition Plan: Will likely be in the hospital another 5 days.  Consultants:  General surgery  Oncology  Time spent: 25 minutes, case discussed with general surgery and oncology  Kemonie Cutillo [employer]

## 2017-10-15 NOTE — Consult Note (Signed)
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  INTERVAL HISTORY: Patient is a 66 year old male who is receiving treatment with immunotherapy for stage IV melanoma who recently presented to the emergency room with severe abdominal pain and bloating.  Patient was noted to have air in his abdomen and underwent emergency surgery last night.  He feels improved, but is still having pain.  He has no neurologic complaints.  He denies any fevers.  He does not complain of chest pain or shortness of breath.  He has no urinary complaints.  Patient feels generally terrible, but offers no further specific complaints.  REVIEW OF SYSTEMS:   Review of Systems  Constitutional: Positive for malaise/fatigue. Negative for fever and weight loss.  Respiratory: Negative.  Negative for cough and shortness of breath.   Cardiovascular: Negative.  Negative for chest pain and leg swelling.  Gastrointestinal: Positive for abdominal pain.  Genitourinary: Negative.   Musculoskeletal: Negative.   Skin: Negative.  Negative for rash.  Neurological: Positive for weakness.  Psychiatric/Behavioral: Negative.  The patient is not nervous/anxious.     As per HPI. Otherwise, a complete review of systems is negative.  PAST MEDICAL HISTORY: Past Medical History:  Diagnosis Date  . Anemia   . Arthritis   . Complication of anesthesia   . PONV (postoperative nausea and vomiting)     PAST SURGICAL HISTORY: Past Surgical History:  Procedure Laterality Date  . AXILLARY LYMPH NODE DISSECTION Left 06/26/2017   Procedure: EXCISION OF LEFT AXILLARY MASS;  Surgeon: Johnathan Hausen, MD;  Location: [facility] ORS;  Service: General;  Laterality: Left;  . LAPAROTOMY N/A 10/14/2017    Procedure: EXPLORATORY LAPAROTOMY;  Surgeon: Clayburn Pert, MD;  Location: [facility] ORS;  Service: General;  Laterality: N/A;  . VASECTOMY      FAMILY HISTORY: Family History  Problem Relation Age of Onset  . Diabetes Mother   . Hypertension Mother   . Leukemia Father   . Thyroid disease Sister   . Liver disease Maternal Grandmother   . Heart Problems Maternal Grandfather   . Diabetes Maternal Grandfather   . Lung cancer Paternal Grandmother   . Prostate cancer Paternal Grandfather     ADVANCED DIRECTIVES (Y/N):  @ADVDIR @  HEALTH MAINTENANCE: Social History   Tobacco Use  . Smoking status: Never Smoker  . Smokeless tobacco: Never Used  Substance Use Topics  . Alcohol use: No  . Drug use: No     Colonoscopy:  PAP:  Bone density:  Lipid panel:  No Known Allergies  Current Facility-Administered Medications  Medication Dose Route Frequency Provider Last Rate Last Dose  . Marland KitchenTPN (CLINIMIX-E) Adult   Intravenous Continuous TPN Coffee, Donna Christen, RPH      . acetaminophen (TYLENOL) tablet 650 mg  650 mg Oral Q6H PRN Salary, Montell D, MD       Or  . acetaminophen (TYLENOL) suppository 650 mg  650 mg Rectal Q6H PRN Salary, Montell D, MD      . enoxaparin (LOVENOX) injection 40 mg  40 mg Subcutaneous Q24H Salary, Montell D, MD   40 mg at 10/14/17 2154  . fat emulsion 20 % infusion 250 mL  250 mL Intravenous Continuous TPN Coffee, Donna Christen, [facility]      . HYDROcodone-acetaminophen (NORCO/VICODIN) 5-325 MG per tablet 1-2 tablet  1-2 tablet Oral Q4H PRN Salary, Montell D, MD      . insulin aspart (novoLOG) injection 0-15 Units  0-15 Units Subcutaneous Q4H Clayburn Pert, MD      . lactated ringers 1,000 mL with potassium chloride 20 mEq infusion   Intravenous Continuous Larene Beach, RPH 50 mL/hr at 10/15/17 0736    . morphine 2 MG/ML injection 2 mg  2 mg Intravenous Q4H PRN Loletha Grayer, MD   2 mg at 10/15/17 0146  . ondansetron (ZOFRAN) tablet 4 mg  4 mg Oral Q6H PRN Salary,  Montell D, MD       Or  . ondansetron (ZOFRAN) injection 4 mg  4 mg Intravenous Q6H PRN Salary, Montell D, MD   4 mg at 10/15/17 1418  . piperacillin-tazobactam (ZOSYN) IVPB 3.375 g  3.375 g Intravenous Q8H Coffee, Donna Christen, [facility]   Stopped at 10/15/17 6203  . sodium chloride flush (NS) 0.9 % injection 10-40 mL  10-40 mL Intracatheter Q12H Wieting, Richard, MD      . sodium chloride flush (NS) 0.9 % injection 10-40 mL  10-40 mL Intracatheter PRN Wieting, Richard, MD      . sodium chloride flush (NS) 0.9 % injection 3 mL  3 mL Intravenous Q12H Salary, Montell D, MD   3 mL at 10/15/17 0809    OBJECTIVE: Vitals:   10/15/17 0424 10/15/17 1333  BP: 130/68 (!) 143/69  Pulse: 98 92  Resp: 20 16  Temp: 99.3 F (37.4 C)   SpO2: 92% 94%     Body mass index is 27.21 kg/m.    ECOG FS:2 - Symptomatic, <50% confined to bed  General: Well-developed, well-nourished, no acute distress. Eyes: Pink conjunctiva, anicteric sclera. HEENT: NG tube in place.  Normocephalic, moist mucous membranes, clear oropharnyx. Lungs: Clear to auscultation bilaterally. Heart: Regular rate and rhythm. No rubs, murmurs, or gallops. Abdomen: Tender. Musculoskeletal: No edema, cyanosis, or clubbing. Neuro: Alert, answering all questions appropriately. Cranial nerves grossly intact. Skin: No rashes or petechiae noted. Psych: Normal affect.    LAB RESULTS:  Lab Results  Component Value Date   NA 137 10/15/2017   K 3.7 10/15/2017   CL 103 10/15/2017   CO2 26 10/15/2017   GLUCOSE 112 (H) 10/15/2017   BUN 18 10/15/2017   CREATININE 0.86 10/15/2017   CALCIUM 7.9 (L) 10/15/2017   PROT 7.7 10/10/2017   ALBUMIN 4.3 10/10/2017   AST 25 10/10/2017   ALT 22 10/10/2017   ALKPHOS 74 10/10/2017   BILITOT 0.9 10/10/2017   GFRNONAA >60 10/15/2017   GFRAA >60 10/15/2017    Lab Results  Component Value Date   WBC 8.5 10/15/2017   NEUTROABS 3.8 09/23/2017   HGB 12.5 (L) 10/15/2017   HCT 38.3 (L) 10/15/2017   MCV 77.8  (L) 10/15/2017   PLT 209 10/15/2017     STUDIES: Dg Abd 1 View  Result Date: 10/14/2017 CLINICAL DATA:  66 year old male with a history of abdominal distention/small bowel obstruction EXAM: ABDOMEN - 1 VIEW COMPARISON:  10/13/2017, 10/12/2017, 10/10/2017, PET-CT 10/09/2017 FINDINGS: Persisting distension of small bowel and colonic bowel loops. Upper abdomen not imaged. No radiopaque foreign bodies. No acute bony abnormality. IMPRESSION: Persisting bowel obstruction. If there is concern for acute intraabdominal process, recommend contrast-enhanced CT. Electronically Signed   By: Corrie Mckusick D.O.   On: 10/14/2017 07:50   Dg Abd 1 View  Result Date: 10/13/2017 CLINICAL DATA:  Ileus EXAM: ABDOMEN - 1 VIEW COMPARISON:  10/12/2017 FINDINGS: Gaseous distension  of large and small bowel again noted. No free air organomegaly. No acute bony abnormality. IMPRESSION: Stable gaseous distention of large and small bowel, most compatible with ileus. Electronically Signed   By: Rolm Baptise M.D.   On: 10/13/2017 07:37   Dg Abd 1 View  Result Date: 10/12/2017 CLINICAL DATA:  Evaluate ileus. EXAM: ABDOMEN - 1 VIEW COMPARISON:  Abdominal radiograph performed 10/10/2017 FINDINGS: There is diffuse distention of small and large bowel loops with air, with small-bowel loops measuring up to 7.0 cm in diameter. This is suspicious for worsening ileus. The patient's enteric tube is seen ending overlying the body of the stomach. No free intra-abdominal air is seen, though evaluation for free air is limited on supine views. No acute osseous abnormalities are identified. IMPRESSION: Increasing distention of small and large bowel loops with air, with small bowel loops measuring up to 7.0 cm in diameter. This is suspicious for worsening ileus. No definite evidence for bowel obstruction. No free intra-abdominal air seen. Electronically Signed   By: Garald Balding M.D.   On: 10/12/2017 01:32   Dg Abdomen 1 View  Result Date:  10/10/2017 CLINICAL DATA:  66 year old male status post NG tube placement EXAM: ABDOMEN - 1 VIEW COMPARISON:  Prior abdominal radiograph 10/10/2017 FINDINGS: Interval placement of a gastric tube. The tube is coiled in the stomach. Slightly improved gaseous distension of the small bowel. Persisting gaseous distension of the colon. Large colonic stool burden. IMPRESSION: Newly placed nasogastric tube is coiled in the stomach. Electronically Signed   By: Jacqulynn Cadet M.D.   On: 10/10/2017 17:10   Dg Abd 1 View  Result Date: 10/10/2017 CLINICAL DATA:  66 year old presenting with no bowel movement in the past 6 days and acute onset of vomiting that began last night, associated with abdominal distention. Patient currently undergoing immunotherapy for metastatic melanoma. EXAM: ABDOMEN - 1 VIEW COMPARISON:  PET-CT yesterday and 07/15/2017. FINDINGS: Multiple dilated loops of small bowel in the upper abdomen as noted on the PET-CT yesterday. Very large colonic stool burden. Gas and stool throughout the upper normal caliber colon from cecum to rectum. No suggestion of free air on the supine image. IMPRESSION: 1. Stable small bowel distention since yesterday's PET-CT, ileus versus early partial small bowel obstruction. Serial follow-up abdominal x-rays may be helpful. 2. Very large colonic stool burden. Electronically Signed   By: Evangeline Dakin M.D.   On: 10/10/2017 11:50   Ct Abdomen Pelvis W Contrast  Result Date: 10/14/2017 CLINICAL DATA:  Acute generalized abdominal pain and distention. EXAM: CT ABDOMEN AND PELVIS WITH CONTRAST TECHNIQUE: Multidetector CT imaging of the abdomen and pelvis was performed using the standard protocol following bolus administration of intravenous contrast. CONTRAST:  68mL ISOVUE-370 IOPAMIDOL (ISOVUE-370) INJECTION 76% COMPARISON:  PET scan of October 09, 2017. Radiographs of same day. FINDINGS: Lower chest: Mild bilateral posterior basilar subsegmental atelectasis.  Hepatobiliary: No gallstones are noted. Multiple small low densities are noted which are stable compared to prior exam and most consistent with hepatic cysts, although metastatic disease cannot be excluded given the history of melanoma. Pancreas: Unremarkable. No pancreatic ductal dilatation or surrounding inflammatory changes. Spleen: Normal in size without focal abnormality. Adrenals/Urinary Tract: Adrenal glands are unremarkable. Kidneys are normal, without renal calculi, focal lesion, or hydronephrosis. Bladder is unremarkable. Stomach/Bowel: The stomach appears normal. There is significantly increased small bowel dilatation concerning for distal small bowel obstruction. No colonic dilatation is noted. Stool is noted in the colon. Pneumatosis is seen involving a small bowel  loop in the pelvis best seen on image number 73 of series 2 concerning for ischemic bowel. Vascular/Lymphatic: No significant vascular findings are present. No enlarged abdominal or pelvic lymph nodes. Reproductive: Prostate is unremarkable. Other: Pneumoperitoneum is noted in the epigastric region concerning for rupture of hollow viscus. No abnormal fluid collection is noted. Musculoskeletal: No acute or significant osseous findings. IMPRESSION: Significantly increased small bowel dilatation is now noted concerning for distal small bowel obstruction. There is also noted pneumatosis involving dilated small bowel loop in the pelvis concerning for ischemic bowel. Pneumoperitoneum is noted in epigastric region as well concerning for rupture of hollow viscus. Immediate surgical consultation is recommended. Critical Value/emergent results were called by telephone at the time of interpretation on 10/14/2017 at 2:30 pm to Dr. Loletha Grayer , who verbally acknowledged these results. Multiple small low densities are noted within the hepatic parenchyma which may simply represent cysts, but metastatic disease cannot be excluded given the history of  melanoma. Electronically Signed   By: Marijo Conception, M.D.   On: 10/14/2017 14:32   Nm Pet Image Restage (ps) Whole Body  Result Date: 10/09/2017 CLINICAL DATA:  Subsequent treatment strategy for cutaneous melanoma. Immunotherapy December 2018. EXAM: NUCLEAR MEDICINE PET WHOLE BODY TECHNIQUE: 12.6 mCi F-18 FDG was injected intravenously. Full-ring PET imaging was performed from the vertex to the feet after the radiotracer. CT data was obtained and used for attenuation correction and anatomic localization. FASTING BLOOD GLUCOSE:  Value: 83 mg/dl COMPARISON:  07/15/2017 FINDINGS: HEAD/NECK No hypermetabolic activity in the scalp. No hypermetabolic cervical lymph nodes. Right maxillary molar periapical lucency with adjacent mucoperiosteal thickening in the right maxillary sinus. CHEST Dominant left axillary mass measures 9.1 by 5.8 cm on image 117/4 with maximum SUV 21.4 (formerly 16.5). The smaller adjacent lymph nodes demonstrate only very weak metabolic activity. There is a fluid density collection extending from the center of the axillary process towards the skin surface. A right supraclavicular node measuring 1.1 cm in short axis on image 91/4 previously measured 0.6 cm and currently has a maximum SUV of 3.5 (formerly not appreciably metabolic). Hypermetabolic right paratracheal, subcarinal, bilateral hilar, and bilateral infrahilar lymph nodes are present. A index subcarinal node 1.2 cm in short axis on image 123/4 (formerly 0.6 cm) with maximum SUV 6.7 (formerly 4.0). Scarring in both lower lobes, with a bandlike nodular region of volume loss in the left lower lobe on image 147/4 with low-grade associated metabolic activity, probably a all from atelectasis. Mild bilateral airway thickening. ABDOMEN/PELVIS No abnormal hypermetabolic activity within the liver, pancreas, adrenal glands, or spleen. No hypermetabolic lymph nodes in the abdomen or pelvis. A small hypodense lesion in the vicinity of the falciform  ligament is not appreciably hypermetabolic and appear stable from prior, possibly focal fatty infiltration of the liver. Dependent density in the gallbladder favor sludge. Rectal air- fluid level favoring diarrheal process. Borderline dilated loops of pelvic small bowel without a well-defined transition point. Prior accentuated activity in the anorectal junction has resolved. SKELETON Expansile left iliac bone lesion with similar size and morphology compared the prior exam, 2.0 cm in long axis, maximum SUV 2.5 (previously 3.1). 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 EXTREMITIES No abnormal hypermetabolic activity in the lower extremities. IMPRESSION: 1. The dominant left axillary mass is essentially stable in size by my measurements, and has a maximum SUV of 21.4 (formerly 16.5). Also the mediastinal and hilar/infrahilar lymph nodes in the chest demonstrate mild enlargement and moderate increased in standard uptake value. Possibilities include  mild progression of versus immunotherapy-related pseudoprogression, and surveillance is recommended. As before, left axillary mass has a cutaneous extension inferiorly and may possibly be draining to the skin. 2. Roughly stable appearance of the expansile left iliac bone lesion, with low-grade metabolic activity. 3. No additional involvement identified. 4. Borderline dilated small bowel, significance uncertain. No definite hypermetabolic activity within or along the small bowel to further suggest metastatic disease to the bowel. There is also an air- fluid level in the rectum compatible with diarrheal process. 5. Increased atelectasis along the left lung base. 6. Other imaging findings of potential clinical significance: Periapical lucency along a right maxillary molar. 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 Airway thickening is present, suggesting bronchitis or reactive airways disease. Electronically Signed   By: Van Clines M.D.   On: 10/09/2017 15:07   Dg Chest Port 1  View  Result Date: 10/15/2017 CLINICAL DATA:  Confirm line placement. EXAM: PORTABLE CHEST 1 VIEW COMPARISON:  No recent chest x-ray for review. FINDINGS: The lungs are mildly hypoinflated but clear. The heart is normal in size. The pulmonary vascularity is not engorged. A right-sided PICC line has its tip terminating over the midportion of the SVC. There is an esophagogastric tube present whose tip in proximal port project below the GE junction. IMPRESSION: Positioning of the right-sided PICC line and the esophagogastric tube as described. Mild hypoinflation. No definite acute cardiopulmonary abnormality. Electronically Signed   By: David  Martinique M.D.   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  PLAN:    1.  Intestinal perforation: Patient underwent emergency surgery last night.  Appreciate surgical input.  Immunotherapy lists intestinal perforation as a rare side effect with unknown incidence, but less than 10%.  No other obvious etiology has been determined.  Continue follow-up with surgery as indicated.  Will discontinue immunotherapy and patient should follow-up in the cancer center in mid January to discuss treatment options. 2.  Metastatic melanoma: Patient received cycle 4 of 4 ofipilumimab and nivolumab on September 23, 2017.  Plan was to continue with maintenance nivolumab, but given his intestinal perforation will likely discontinue his treatment and proceed with oral Zelboraf.  PET results from October 09, 2017 reviewed independently likely indicating pseudo-progression of melanoma. 3.  Disposition: Discharge per surgery.  Follow-up in the cancer center in mid January for treatment planning.  Appreciate consult, call with questions.    Cancer Staging Melanoma of skin ([place]) Staging form: Melanoma of the Skin, AJCC 8th Edition - Clinical stage from 07/15/2017: Stage IV (cTX, cN1, cM1c(0)) - Signed by Lloyd Huger, MD  on 07/15/2017   Lloyd Huger, MD   10/15/2017 3:52 PM

## 2017-10-15 NOTE — Anesthesia Postprocedure Evaluation (Signed)
Anesthesia Post Note  Patient: ONDRE SALVETTI  Procedure(s) Performed: EXPLORATORY LAPAROTOMY (N/A )  Patient location during evaluation: PACU Anesthesia Type: General Level of consciousness: awake and alert Pain management: pain level controlled Vital Signs Assessment: post-procedure vital signs reviewed and stable Respiratory status: spontaneous breathing, nonlabored ventilation, respiratory function stable and patient connected to nasal cannula oxygen Cardiovascular status: blood pressure returned to baseline and stable Postop Assessment: no apparent nausea or vomiting Anesthetic complications: no     Last Vitals:  Vitals:   10/14/17 2034 10/14/17 2137  BP: 136/75 133/81  Pulse: 90 90  Resp: (!) 22 (!) 22  Temp: (!) 36.4 C (!) 36.4 C  SpO2: 92% 92%    Last Pain:  Vitals:   10/14/17 2137  TempSrc: Oral  PainSc:                  Rahkim Rabalais S

## 2017-10-15 NOTE — Progress Notes (Addendum)
Per Dr Adonis Huguenin foley to stay in until tomorrow when order to d/c maybe entered .

## 2017-10-15 NOTE — Progress Notes (Signed)
Nutrition Follow-up  DOCUMENTATION CODES:   Not applicable  INTERVENTION:  Once PICC placed and confirmed recommend initiating Clinimix E 5/20 at 40 mL/hr + 20% ILE at 20 ml/hr over 12 hours. After 24 hours if electrolytes, CBGs, and triglycerides are within acceptable range can advance to goal regimen of Clinimix E 5/20 at 83 mL/hr + 20% ILE at 20 mL/hr over 12 hours. Provides 2233 kcal, 100 grams of protein, 2232 mL fluid daily.  Provide adult MVI and trace elements as daily TPN additives.  Recommend measuring daily weights to help monitor volume status on TPN.  NUTRITION DIAGNOSIS:   Inadequate oral intake related to nausea, vomiting, other (see comment)(abdominal pain and distention) as evidenced by per patient/family report.  Ongoing - patient is now NPO.  GOAL:   Patient will meet greater than or equal to 90% of their needs  Not met - addressing with initiation of TPN.  MONITOR:   Diet advancement, Labs, Weight trends, I & O's  REASON FOR ASSESSMENT:   Malnutrition Screening Tool, Consult New TPN/TNA  ASSESSMENT:   66 year old male with PMHx of anemia, metastatic melanoma to mediastinum and bone on combination immunotherapy  Who presented with constipation, N/V, and abdominal distention found to have ileus vs early SBO.  -On 12/25 patient was feeling better, had minimal NGT output and no N/V. NGT was removed and he was started on clear liquids again. -Then overnight patient had worsening of abdominal discomfort and distention and return of nausea. -Patient s/p CT Abd/Pelvis 12/26 which found increased small bowel dilation concerning for distal SBO, pneumatosis concerning for ischemic bowel, pneumoperitoneum in epigastric region concerning for rupture of hollow viscus. -He was taken for exploratory laparotomy evening of 12/26, but no obvious perforation was found.  Patient sleeping at time of RD assessment. Met with his brother at bedside. Answered all questions  brother had about TPN. No further questions at this time.  IV Access: order for PICC placement  Medications reviewed and include: Novolog 0-15 units Q4hrs, LR @ 50 mL/hr, Zosyn.  Labs reviewed: Potassium 3.7.  Discussed with RN.  Diet Order:  Diet NPO time specified Except for: Ice Chips .TPN (CLINIMIX-E) Adult  EDUCATION NEEDS:   No education needs have been identified at this time  Skin:  Skin Assessment: Skin Integrity Issues: Skin Integrity Issues:: Incisions Incisions: closed incision to abdomen  Last BM:  10/13/2017 - large type 5  Height:   Ht Readings from Last 1 Encounters:  10/10/17 '5\' 11"'  (1.803 m)    Weight:   Wt Readings from Last 1 Encounters:  10/12/17 195 lb 1.6 oz (88.5 kg)    Ideal Body Weight:  78.2 kg  BMI:  Body mass index is 27.21 kg/m.  Estimated Nutritional Needs:   Kcal:  2015-2350 (MSJ x 1.2-1.4)  Protein:  90-105 grams (1-1.2 grams/kg)  Fluid:  2.2-2.6 L/day (25-30 mL/kg)  Willey Blade, MS, RD, LDN Office: [phone] Pager: [phone] After Hours/Weekend Pager: [phone]

## 2017-10-15 NOTE — Progress Notes (Signed)
PHARMACY - ADULT TOTAL PARENTERAL NUTRITION CONSULT NOTE   Pharmacy Consult for TPN and intralipid Indication: malnourishment secondary to ileus   Patient Measurements: Height: 5\' 11"  (180.3 cm) Weight: 195 lb 1.6 oz (88.5 kg)(bed scale) IBW/kg (Calculated) : 75.3 TPN AdjBW (KG): 89.4 Body mass index is 27.21 kg/m.   Assessment:   GI: ileus  Endo:  Insulin requirements in the past 24 hours: 0 Lytes: Renal: Pulm: Cards:  Hepatobil: Neuro: ID: Anti-infectives (From admission, onward)   Start     Dose/Rate Route Frequency Ordered Stop   10/14/17 1515  piperacillin-tazobactam (ZOSYN) IVPB 3.375 g     3.375 g 12.5 mL/hr over 240 Minutes Intravenous Every 8 hours 10/14/17 1507         TPN Access: 12/27  TPN start date: 12/27 Nutritional Goals (per RD recommendation on 12.27.18): KCal: 2233kcal Protein: 100gm  Fluid: 2232 ml fluid daily   Goal TPN rate is 83 ml/hr (provides 2233kcal, 100gm protein, 2232 ml fluid daily)      Current Nutrition:   Plan:  clinamix e 5/20 TPN at 40 mL/hr. Hold 20% lipid emulsion for first 7 days for ICU patients per ASPEN guidelines (Start date 12/27) This TPN provides 50 g of protein, 17 g of dextrose, and 50 g of lipids which provides 1230 kCals per day, meeting 45% of patient needs Electrolytes in TPN: clinamix e  Add MVI, trace elements, 0 units of regular insulin novolog 0-15units SSI and adjust as needed LR with 50meq KCl IVMF (D5, NS, etc.) at 50 ml/hr Monitor TPN labs, BMP F/U 12/28   Louis Davies Louis Davies 10/15/2017,10:54 AM

## 2017-10-15 NOTE — Progress Notes (Signed)
1 Day Post-Op   Subjective: Patient reports abdominal soreness this morning that is different than before surgery.  States he actually feels better than before surgery.  Primary complaint this morning is from the tube down his nose.  He denies any flatus and has not been out of bed yet.  Vital signs in last 24 hours: Temp:  [97.5 F (36.4 C)-99.3 F (37.4 C)] 99.3 F (37.4 C) (12/27 0424) Pulse Rate:  [74-98] 98 (12/27 0424) Resp:  [14-22] 20 (12/27 0424) BP: (126-143)/(68-90) 130/68 (12/27 0424) SpO2:  [91 %-100 %] 92 % (12/27 0424) Last BM Date: 10/14/17  Intake/Output from previous day: 12/26 0701 - 12/27 0700 In: 3250.8 [I.V.:3100.8; IV Piggyback:150] Out: 4098 [Urine:750; Emesis/NG output:200; Blood:25]  GI: Abdomen is soft, appropriately tender to palpation along the midline incision, nondistended.  Honeycomb dressing in place with minimal serosanguineous drainage from the most dependent aspect  Lab Results:  CBC Recent Labs    10/15/17 0622  WBC 8.5  HGB 12.5*  HCT 38.3*  PLT 209   CMP     Component Value Date/Time   NA 137 10/15/2017 0622   K 3.7 10/15/2017 0622   CL 103 10/15/2017 0622   CO2 26 10/15/2017 0622   GLUCOSE 112 (H) 10/15/2017 0622   BUN 18 10/15/2017 0622   CREATININE 0.86 10/15/2017 0622   CALCIUM 7.9 (L) 10/15/2017 0622   PROT 7.7 10/10/2017 1114   ALBUMIN 4.3 10/10/2017 1114   AST 25 10/10/2017 1114   ALT 22 10/10/2017 1114   ALKPHOS 74 10/10/2017 1114   BILITOT 0.9 10/10/2017 1114   GFRNONAA >60 10/15/2017 0622   GFRAA >60 10/15/2017 0622   PT/INR No results for input(s): LABPROT, INR in the last 72 hours.  Studies/Results: Dg Abd 1 View  Result Date: 10/14/2017 CLINICAL DATA:  66 year old male with a history of abdominal distention/small bowel obstruction EXAM: ABDOMEN - 1 VIEW COMPARISON:  10/13/2017, 10/12/2017, 10/10/2017, PET-CT 10/09/2017 FINDINGS: Persisting distension of small bowel and colonic bowel loops. Upper abdomen  not imaged. No radiopaque foreign bodies. No acute bony abnormality. IMPRESSION: Persisting bowel obstruction. If there is concern for acute intraabdominal process, recommend contrast-enhanced CT. Electronically Signed   By: Corrie Mckusick D.O.   On: 10/14/2017 07:50   Ct Abdomen Pelvis W Contrast  Result Date: 10/14/2017 CLINICAL DATA:  Acute generalized abdominal pain and distention. EXAM: CT ABDOMEN AND PELVIS WITH CONTRAST TECHNIQUE: Multidetector CT imaging of the abdomen and pelvis was performed using the standard protocol following bolus administration of intravenous contrast. CONTRAST:  71mL ISOVUE-370 IOPAMIDOL (ISOVUE-370) INJECTION 76% COMPARISON:  PET scan of October 09, 2017. Radiographs of same day. FINDINGS: Lower chest: Mild bilateral posterior basilar subsegmental atelectasis. Hepatobiliary: No gallstones are noted. Multiple small low densities are noted which are stable compared to prior exam and most consistent with hepatic cysts, although metastatic disease cannot be excluded given the history of melanoma. Pancreas: Unremarkable. No pancreatic ductal dilatation or surrounding inflammatory changes. Spleen: Normal in size without focal abnormality. Adrenals/Urinary Tract: Adrenal glands are unremarkable. Kidneys are normal, without renal calculi, focal lesion, or hydronephrosis. Bladder is unremarkable. Stomach/Bowel: The stomach appears normal. There is significantly increased small bowel dilatation concerning for distal small bowel obstruction. No colonic dilatation is noted. Stool is noted in the colon. Pneumatosis is seen involving a small bowel loop in the pelvis best seen on image number 73 of series 2 concerning for ischemic bowel. Vascular/Lymphatic: No significant vascular findings are present. No enlarged abdominal or pelvic  lymph nodes. Reproductive: Prostate is unremarkable. Other: Pneumoperitoneum is noted in the epigastric region concerning for rupture of hollow viscus. No  abnormal fluid collection is noted. Musculoskeletal: No acute or significant osseous findings. IMPRESSION: Significantly increased small bowel dilatation is now noted concerning for distal small bowel obstruction. There is also noted pneumatosis involving dilated small bowel loop in the pelvis concerning for ischemic bowel. Pneumoperitoneum is noted in epigastric region as well concerning for rupture of hollow viscus. Immediate surgical consultation is recommended. Critical Value/emergent results were called by telephone at the time of interpretation on 10/14/2017 at 2:30 pm to Dr. Loletha Grayer , who verbally acknowledged these results. Multiple small low densities are noted within the hepatic parenchyma which may simply represent cysts, but metastatic disease cannot be excluded given the history of melanoma. Electronically Signed   By: Marijo Conception, M.D.   On: 10/14/2017 14:32    Assessment/Plan: 66 year old male status post exploratory laparotomy for pneumatosis and pneumoperitoneum.  No clear source of perforation was identified.  Patient doing okay this morning.  Discussed with primary team the anticipated prolonged ileus after the surgery from yesterday.  Orders for a PICC line and TPN consult replaced this morning.  The discussed with the patient that he be okay for him to have ice chips but it is anticipated that his NG tube will be in for several days.  Okay for the patient to ambulate with the clamps while out of the room but he needs to have the NG tube return to suction as soon as he returns.  Discussed with nursing staff about obtaining longer suction tubing so the patient could sit in a chair.   Clayburn Pert, MD [facility]  Day [facility] [phone] Night [facility] [phone]  10/15/2017

## 2017-10-15 NOTE — Telephone Encounter (Signed)
Spoke to [place] on 1C to inform her of pt appt change. She will notify pt of changes and give a copy of the AVS.

## 2017-10-16 ENCOUNTER — Ambulatory Visit: Payer: Medicare Other

## 2017-10-16 ENCOUNTER — Ambulatory Visit: Payer: Medicare Other | Admitting: Oncology

## 2017-10-16 ENCOUNTER — Other Ambulatory Visit: Payer: Medicare Other

## 2017-10-16 LAB — GLUCOSE, CAPILLARY
GLUCOSE-CAPILLARY: 122 mg/dL — AB (ref 65–99)
GLUCOSE-CAPILLARY: 134 mg/dL — AB (ref 65–99)
GLUCOSE-CAPILLARY: 154 mg/dL — AB (ref 65–99)
Glucose-Capillary: 132 mg/dL — ABNORMAL HIGH (ref 65–99)
Glucose-Capillary: 138 mg/dL — ABNORMAL HIGH (ref 65–99)
Glucose-Capillary: 145 mg/dL — ABNORMAL HIGH (ref 65–99)

## 2017-10-16 LAB — CBC
HEMATOCRIT: 37.5 % — AB (ref 40.0–52.0)
Hemoglobin: 12.1 g/dL — ABNORMAL LOW (ref 13.0–18.0)
MCH: 25.1 pg — AB (ref 26.0–34.0)
MCHC: 32.3 g/dL (ref 32.0–36.0)
MCV: 77.8 fL — AB (ref 80.0–100.0)
PLATELETS: 211 10*3/uL (ref 150–440)
RBC: 4.81 MIL/uL (ref 4.40–5.90)
RDW: 16.3 % — AB (ref 11.5–14.5)
WBC: 6.8 10*3/uL (ref 3.8–10.6)

## 2017-10-16 LAB — DIFFERENTIAL
BASOS PCT: 1 %
Basophils Absolute: 0 10*3/uL (ref 0–0.1)
EOS PCT: 2 %
Eosinophils Absolute: 0.1 10*3/uL (ref 0–0.7)
Lymphocytes Relative: 12 %
Lymphs Abs: 0.8 10*3/uL — ABNORMAL LOW (ref 1.0–3.6)
MONO ABS: 0.7 10*3/uL (ref 0.2–1.0)
MONOS PCT: 11 %
NEUTROS ABS: 5.1 10*3/uL (ref 1.4–6.5)
Neutrophils Relative %: 74 %

## 2017-10-16 LAB — COMPREHENSIVE METABOLIC PANEL
ALK PHOS: 51 U/L (ref 38–126)
ALT: 14 U/L — AB (ref 17–63)
AST: 16 U/L (ref 15–41)
Albumin: 2.7 g/dL — ABNORMAL LOW (ref 3.5–5.0)
Anion gap: 5 (ref 5–15)
BILIRUBIN TOTAL: 0.6 mg/dL (ref 0.3–1.2)
BUN: 19 mg/dL (ref 6–20)
CALCIUM: 8.3 mg/dL — AB (ref 8.9–10.3)
CO2: 31 mmol/L (ref 22–32)
CREATININE: 0.83 mg/dL (ref 0.61–1.24)
Chloride: 102 mmol/L (ref 101–111)
Glucose, Bld: 137 mg/dL — ABNORMAL HIGH (ref 65–99)
Potassium: 3.2 mmol/L — ABNORMAL LOW (ref 3.5–5.1)
Sodium: 138 mmol/L (ref 135–145)
TOTAL PROTEIN: 5.8 g/dL — AB (ref 6.5–8.1)

## 2017-10-16 LAB — TRIGLYCERIDES: Triglycerides: 109 mg/dL (ref <150)

## 2017-10-16 LAB — PREALBUMIN: PREALBUMIN: 6.2 mg/dL — AB (ref 18–38)

## 2017-10-16 LAB — MAGNESIUM: MAGNESIUM: 1.9 mg/dL (ref 1.7–2.4)

## 2017-10-16 LAB — PHOSPHORUS: Phosphorus: 2.9 mg/dL (ref 2.5–4.6)

## 2017-10-16 MED ORDER — POTASSIUM CHLORIDE 10 MEQ/100ML IV SOLN
10.0000 meq | INTRAVENOUS | Status: AC
  Administered 2017-10-16 (×3): 10 meq via INTRAVENOUS
  Filled 2017-10-16 (×3): qty 100

## 2017-10-16 MED ORDER — MAGNESIUM SULFATE 2 GM/50ML IV SOLN
2.0000 g | Freq: Once | INTRAVENOUS | Status: AC
  Administered 2017-10-16: 08:00:00 2 g via INTRAVENOUS
  Filled 2017-10-16: qty 50

## 2017-10-16 MED ORDER — BISACODYL 10 MG RE SUPP
10.0000 mg | Freq: Once | RECTAL | Status: AC
  Administered 2017-10-16: 13:00:00 10 mg via RECTAL
  Filled 2017-10-16: qty 1

## 2017-10-16 MED ORDER — TRACE MINERALS CR-CU-MN-SE-ZN 10-1000-500-60 MCG/ML IV SOLN
INTRAVENOUS | Status: AC
  Administered 2017-10-16: 19:00:00 via INTRAVENOUS
  Filled 2017-10-16: qty 1992

## 2017-10-16 MED ORDER — FAT EMULSION 20 % IV EMUL
250.0000 mL | INTRAVENOUS | Status: AC
  Administered 2017-10-16: 19:00:00 250 mL via INTRAVENOUS
  Filled 2017-10-16: qty 250

## 2017-10-16 NOTE — Progress Notes (Signed)
2 Days Post-Op  Subjective: Status post ex lap for possible free air.  Laparotomy was negative.  Patient feels much better today however.  He has not passed any gas however he does have a nasogastric tube in place  Objective: Vital signs in last 24 hours: Temp:  [97.4 F (36.3 C)-98 F (36.7 C)] 98 F (36.7 C) (12/28 1054) Pulse Rate:  [72-92] 75 (12/28 1054) Resp:  [16-30] 20 (12/28 1054) BP: (127-147)/(69-82) 147/82 (12/28 1054) SpO2:  [91 %-94 %] 92 % (12/28 1054) Weight:  [197 lb 6.4 oz (89.5 kg)-201 lb 4 oz (91.3 kg)] 201 lb 4 oz (91.3 kg) (12/28 0500) Last BM Date: 10/14/17  Intake/Output from previous day: 12/27 0701 - 12/28 0700 In: 1224.5 [I.V.:1124.5; IV Piggyback:100] Out: 2000 [Urine:800; Emesis/NG output:1200] Intake/Output this shift: Total I/O In: -  Out: 650 [Urine:650]  Physical exam:  Vital signs reviewed and stable abdomen is distended and tympanitic but nontender wound is clean with honeycomb dressing in place.  Calves are nontender bilious output in nasogastric canister  Lab Results: CBC  Recent Labs    10/15/17 0622 10/16/17 0536  WBC 8.5 6.8  HGB 12.5* 12.1*  HCT 38.3* 37.5*  PLT 209 211   BMET Recent Labs    10/15/17 0622 10/16/17 0536  NA 137 138  K 3.7 3.2*  CL 103 102  CO2 26 31  GLUCOSE 112* 137*  BUN 18 19  CREATININE 0.86 0.83  CALCIUM 7.9* 8.3*   PT/INR No results for input(s): LABPROT, INR in the last 72 hours. ABG No results for input(s): PHART, HCO3 in the last 72 hours.  Invalid input(s): PCO2, PO2  Studies/Results: Ct Abdomen Pelvis W Contrast  Result Date: 10/14/2017 CLINICAL DATA:  Acute generalized abdominal pain and distention. EXAM: CT ABDOMEN AND PELVIS WITH CONTRAST TECHNIQUE: Multidetector CT imaging of the abdomen and pelvis was performed using the standard protocol following bolus administration of intravenous contrast. CONTRAST:  35mL ISOVUE-370 IOPAMIDOL (ISOVUE-370) INJECTION 76% COMPARISON:  PET scan  of October 09, 2017. Radiographs of same day. FINDINGS: Lower chest: Mild bilateral posterior basilar subsegmental atelectasis. Hepatobiliary: No gallstones are noted. Multiple small low densities are noted which are stable compared to prior exam and most consistent with hepatic cysts, although metastatic disease cannot be excluded given the history of melanoma. Pancreas: Unremarkable. No pancreatic ductal dilatation or surrounding inflammatory changes. Spleen: Normal in size without focal abnormality. Adrenals/Urinary Tract: Adrenal glands are unremarkable. Kidneys are normal, without renal calculi, focal lesion, or hydronephrosis. Bladder is unremarkable. Stomach/Bowel: The stomach appears normal. There is significantly increased small bowel dilatation concerning for distal small bowel obstruction. No colonic dilatation is noted. Stool is noted in the colon. Pneumatosis is seen involving a small bowel loop in the pelvis best seen on image number 73 of series 2 concerning for ischemic bowel. Vascular/Lymphatic: No significant vascular findings are present. No enlarged abdominal or pelvic lymph nodes. Reproductive: Prostate is unremarkable. Other: Pneumoperitoneum is noted in the epigastric region concerning for rupture of hollow viscus. No abnormal fluid collection is noted. Musculoskeletal: No acute or significant osseous findings. IMPRESSION: Significantly increased small bowel dilatation is now noted concerning for distal small bowel obstruction. There is also noted pneumatosis involving dilated small bowel loop in the pelvis concerning for ischemic bowel. Pneumoperitoneum is noted in epigastric region as well concerning for rupture of hollow viscus. Immediate surgical consultation is recommended. Critical Value/emergent results were called by telephone at the time of interpretation on 10/14/2017 at 2:30 pm to  Dr. Loletha Grayer , who verbally acknowledged these results. Multiple small low densities are noted  within the hepatic parenchyma which may simply represent cysts, but metastatic disease cannot be excluded given the history of melanoma. Electronically Signed   By: Marijo Conception, M.D.   On: 10/14/2017 14:32   Dg Chest Port 1 View  Result Date: 10/15/2017 CLINICAL DATA:  Confirm line placement. EXAM: PORTABLE CHEST 1 VIEW COMPARISON:  No recent chest x-ray for review. FINDINGS: The lungs are mildly hypoinflated but clear. The heart is normal in size. The pulmonary vascularity is not engorged. A right-sided PICC line has its tip terminating over the midportion of the SVC. There is an esophagogastric tube present whose tip in proximal port project below the GE junction. IMPRESSION: Positioning of the right-sided PICC line and the esophagogastric tube as described. Mild hypoinflation. No definite acute cardiopulmonary abnormality. Electronically Signed   By: David  Martinique M.D.   On: 10/15/2017 13:18    Anti-infectives: Anti-infectives (From admission, onward)   Start     Dose/Rate Route Frequency Ordered Stop   10/14/17 1515  piperacillin-tazobactam (ZOSYN) IVPB 3.375 g     3.375 g 12.5 mL/hr over 240 Minutes Intravenous Every 8 hours 10/14/17 1507        Assessment/Plan: s/p Procedure(s): EXPLORATORY LAPAROTOMY   We will give Dulcolax suppository today and continue nasogastric suction for now.  Florene Glen, MD, FACS  10/16/2017

## 2017-10-16 NOTE — Progress Notes (Signed)
Entered pt room found pt. Tense and guarded in movement. RR at 30. Pt had previously refused pain medication with pain at 5 on 0-10 pain scale.  Discussed with pt. The importance of breathing effectively (deep breathing) following abdominal surgery. Discussed risk of shallow breathing following abdominal surgery. Pt stated it hurt to breath to breathy "normally". Discussed pain management with pt. Pt agreed to take acetaminophen. Following up from administration pt was found sleeping with normal respiration rate of 21. Will continue to monitor/ reinforce breathing technique.

## 2017-10-16 NOTE — Progress Notes (Addendum)
PHARMACY - ADULT TOTAL PARENTERAL NUTRITION CONSULT NOTE   Pharmacy Consult for TPN and intralipid Indication: malnourishment secondary to ileus   Patient Measurements: Height: 5\' 11"  (180.3 cm) Weight: 201 lb 4 oz (91.3 kg) IBW/kg (Calculated) : 75.3 TPN AdjBW (KG): 89.4 Body mass index is 28.07 kg/m.   Assessment:   GI: ileus  Endo:  Insulin requirements in the past 24 hours: 0 Lytes: Renal: Pulm: Cards:  Hepatobil: Neuro: ID: Anti-infectives (From admission, onward)   Start     Dose/Rate Route Frequency Ordered Stop   10/14/17 1515  piperacillin-tazobactam (ZOSYN) IVPB 3.375 g     3.375 g 12.5 mL/hr over 240 Minutes Intravenous Every 8 hours 10/14/17 1507         TPN Access: 12/27  TPN start date: 12/27 Nutritional Goals (per RD recommendation on 12.27.18): KCal: 2233kcal Protein: 100gm  Fluid: 2232 ml fluid daily   Goal TPN rate is 83 ml/hr (provides 2233kcal, 100gm protein, 2232 ml fluid daily)      Current Nutrition:   Plan:  clinamix e 5/20 TPN at 83 mL/hr. Hold 20% lipid emulsion for first 7 days for ICU patients per ASPEN guidelines (Start date 12/27) This TPN provides 100 g of protein, dextrose, and 50 g of lipids which provides 2233 kCals per day, meeting 90% of patient needs Electrolytes in TPN: clinamix e  Add MVI, trace elements, 0 units of regular insulin Thiamine 100mg  daily  novolog 0-15units SSI and adjust as needed Monitor TPN labs, BMP F/U 12/29  Donna Christen Medea Deines 10/16/2017,1:19 PM

## 2017-10-16 NOTE — Progress Notes (Signed)
Patient ID: Louis Davies, male   DOB: 1951/09/19, 66 y.o.   MRN: [id number]  [employer] PROGRESS NOTE  Louis Davies EQA:[id number] DOB: 1950-11-27 DOA: 10/10/2017 PCP: [facility]  HPI/Subjective: Patient still with abdominal pain and nausea.  Still has NG tube in with drainage.  Objective: Vitals:   10/16/17 1044 10/16/17 1054  BP:  (!) 147/82  Pulse: 72 75  Resp: 19 20  Temp:  98 F (36.7 C)  SpO2: 92% 92%    Filed Weights   10/12/17 1455 10/15/17 1643 10/16/17 0500  Weight: 88.5 kg (195 lb 1.6 oz) 89.5 kg (197 lb 6.4 oz) 91.3 kg (201 lb 4 oz)    ROS: Review of Systems  Constitutional: Negative for chills and fever.  Eyes: Negative for blurred vision.  Respiratory: Negative for cough and shortness of breath.   Cardiovascular: Negative for chest pain.  Gastrointestinal: Positive for abdominal pain and nausea. Negative for constipation, diarrhea and vomiting.  Genitourinary: Negative for dysuria.  Musculoskeletal: Negative for joint pain.  Neurological: Negative for dizziness and headaches.   Exam: Physical Exam  Constitutional: He is oriented to person, place, and time.  HENT:  Nose: No mucosal edema.  Mouth/Throat: No oropharyngeal exudate or posterior oropharyngeal edema.  Eyes: Conjunctivae, EOM and lids are normal. Pupils are equal, round, and reactive to light.  Neck: No JVD present. Carotid bruit is not present. No edema present. No thyroid mass and no thyromegaly present.  Cardiovascular: S1 normal and S2 normal. Exam reveals no gallop.  No murmur heard. Pulses:      Dorsalis pedis pulses are 2+ on the right side, and 2+ on the left side.  Respiratory: No respiratory distress. He has decreased breath sounds in the right lower field and the left lower field. He has no wheezes. He has no rhonchi. He has no rales.  GI: Soft. He exhibits distension. Bowel sounds are decreased. There is generalized tenderness.  Musculoskeletal:       Right  ankle: He exhibits no swelling.       Left ankle: He exhibits no swelling.  Lymphadenopathy:    He has no cervical adenopathy.  Neurological: He is alert and oriented to person, place, and time. No cranial nerve deficit.  Skin: Skin is warm. No rash noted. Nails show no clubbing.  Psychiatric: He has a normal mood and affect.      Data Reviewed: Basic Metabolic Panel: Recent Labs  Lab 10/11/17 0606  10/12/17 0945 10/13/17 0617 10/14/17 0551 10/15/17 0622 10/16/17 0536  NA 143  --  142  --  137 137 138  K 2.7*   < > 3.0* 3.2* 3.6 3.7 3.2*  CL 106  --  103  --  104 103 102  CO2 27  --  28  --  27 26 31   GLUCOSE 105*  --  116*  --  108* 112* 137*  BUN 16  --  16  --  21* 18 19  CREATININE 0.89  --  0.83  --  0.77 0.86 0.83  CALCIUM 8.6*  --  9.1  --  8.6* 7.9* 8.3*  MG 1.9  --   --  1.8  --   --  1.9  PHOS  --   --   --  3.9  --   --  2.9   < > = values in this interval not displayed.   Liver Function Tests: Recent Labs  Lab 10/10/17 1114 10/16/17 0536  AST  25 16  ALT 22 14*  ALKPHOS 74 51  BILITOT 0.9 0.6  PROT 7.7 5.8*  ALBUMIN 4.3 2.7*   Recent Labs  Lab 10/10/17 1114  LIPASE 33   CBC: Recent Labs  Lab 10/10/17 1114 10/10/17 1807 10/15/17 0622 10/16/17 0536  WBC 7.8 6.5 8.5 6.8  NEUTROABS  --   --   --  5.1  HGB 14.0 13.4 12.5* 12.1*  HCT 43.2 41.1 38.3* 37.5*  MCV 77.3* 78.2* 77.8* 77.8*  PLT 249 220 209 211     CBG: Recent Labs  Lab 10/15/17 2003 10/16/17 0001 10/16/17 0433 10/16/17 0734 10/16/17 1127  GLUCAP 145* 145* 138* 122* 134*     Studies: Dg Chest Port 1 View  Result Date: 10/15/2017 CLINICAL DATA:  Confirm line placement. EXAM: PORTABLE CHEST 1 VIEW COMPARISON:  No recent chest x-ray for review. FINDINGS: The lungs are mildly hypoinflated but clear. The heart is normal in size. The pulmonary vascularity is not engorged. A right-sided PICC line has its tip terminating over the midportion of the SVC. There is an  esophagogastric tube present whose tip in proximal port project below the GE junction. IMPRESSION: Positioning of the right-sided PICC line and the esophagogastric tube as described. Mild hypoinflation. No definite acute cardiopulmonary abnormality. Electronically Signed   By: David  Martinique M.D.   On: 10/15/2017 13:18    Scheduled Meds: . enoxaparin (LOVENOX) injection  40 mg Subcutaneous Q24H  . insulin aspart  0-15 Units Subcutaneous Q4H  . sodium chloride flush  10-40 mL Intracatheter Q12H  . sodium chloride flush  3 mL Intravenous Q12H   Continuous Infusions: . Marland KitchenTPN (CLINIMIX-E) Adult 40 mL/hr at 10/15/17 1757  . Marland KitchenTPN (CLINIMIX-E) Adult    . fat emulsion    . piperacillin-tazobactam (ZOSYN)  IV 3.375 g (10/16/17 1502)    Assessment/Plan:  1. Intestinal perforation with abdominal pain, and abdominal distention.  With exploratory surgery there was no obvious source of the intestinal perforation.  Patient started on TPN.  Patient on empiric antibiotics.  Watch clinically at this point.  Immunotherapy has a rare side effect of intestinal perforation. Not sure if this is the etiology of things. 2. Hypokalemia.  Replace today 3. Hypomagnesemia replaced IV today 4. 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 5. Discontinue Foley catheter 6. Physical therapy evaluation  Code Status:     Code Status Orders  (From admission, onward)        Start     Ordered   10/10/17 1809  Full code  Continuous     10/10/17 1808    Code Status History    Date Active Date Inactive Code Status Order ID Comments User Context   This patient has a current code status but no historical code status.    Advance Directive Documentation     Most Recent Value  Type of Advance Directive  Healthcare Power of Attorney  Pre-existing out of facility DNR order (yellow form or pink MOST form)  No data  "MOST" Form in Place?  No data      Disposition Plan: To be  determined  Consultants:  General surgery  Oncology  Time spent: 24 minutes, case discussed with general surgery.  Grace Haggart [employer]

## 2017-10-16 NOTE — Progress Notes (Signed)
Nutrition Follow-up  DOCUMENTATION CODES:   Not applicable  INTERVENTION:   Increase Clinimix 5/20 with electrolytes to goal rate of 1m/hr   Continue 20% lipids _0 /hr x 12 hrs/day   Regimen provides 2233kcal/day, 100g/day protein, 22325mvolume  Continue MVI daily   Continue trace elements  Continue thiamine 10062maily   Continue to monitor P, K, and Mg daily  Daily weights  NUTRITION DIAGNOSIS:   Inadequate oral intake related to nausea, vomiting, other (see comment)(abdominal pain and distention) as evidenced by per patient/family report.  Ongoing - patient is now NPO.  GOAL:   Patient will meet greater than or equal to 90% of their needs  Not met - addressing with initiation of TPN.  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  Pt tolerating TPN well. Plan to advance to goal rate today. Per chart, pt with 4lb weight gain since admit; RD will continue to monitor daily weights. NGT in place. Hypokalemia today; this is being supplemented.   Medications reviewed and include: lovenox, insulin, zosyn, KCl  Labs reviewed: K 3.2(L), Ca 8.3(L), P 2.9 wnl, Mg 1.9 wnl, alb 2.7(L) Triglycerides- 109- 12/28 cbgs- 108, 112, 137 x 48 hrs  Diet Order:  Diet NPO time specified Except for: Ice Chips .TPN (CLINIMIX-E) Adult  EDUCATION NEEDS:   No education needs have been identified at this time  Skin:  Closed incision to abdomen  Last BM:  10/13/2017 - large type 5  Height:   Ht Readings from Last 1 Encounters:  10/10/17 _1  (1.803 m)    Weight:   Wt Readings from Last 1 Encounters:  10/16/17 201 lb 4 oz (91.3 kg)    Ideal Body Weight:  78.2 kg  BMI:  Body mass index is 28.07 kg/m.  Estimated Nutritional  Needs:   Kcal:  2015-2350 (MSJ x 1.2-1.4)  Protein:  90-105 grams (1-1.2 grams/kg)  Fluid:  2.2-2.6 L/day (25-30 mL/kg)  CasKoleen Distance, RD, LDN Pager #- [phone] Hours Pager: [phone]

## 2017-10-17 LAB — BASIC METABOLIC PANEL
Anion gap: 8 (ref 5–15)
BUN: 19 mg/dL (ref 6–20)
CALCIUM: 8.2 mg/dL — AB (ref 8.9–10.3)
CO2: 27 mmol/L (ref 22–32)
CREATININE: 0.85 mg/dL (ref 0.61–1.24)
Chloride: 102 mmol/L (ref 101–111)
GFR calc non Af Amer: >60 mL/min (ref >60)
GLUCOSE: 152 mg/dL — AB (ref 65–99)
Potassium: 3.6 mmol/L (ref 3.5–5.1)
Sodium: 137 mmol/L (ref 135–145)

## 2017-10-17 LAB — GLUCOSE, CAPILLARY
GLUCOSE-CAPILLARY: 136 mg/dL — AB (ref 65–99)
Glucose-Capillary: 143 mg/dL — ABNORMAL HIGH (ref 65–99)
Glucose-Capillary: 150 mg/dL — ABNORMAL HIGH (ref 65–99)
Glucose-Capillary: 149 mg/dL — ABNORMAL HIGH (ref 65–99)
Glucose-Capillary: 115 mg/dL — ABNORMAL HIGH (ref 65–99)
Glucose-Capillary: 130 mg/dL — ABNORMAL HIGH (ref 65–99)

## 2017-10-17 LAB — PHOSPHORUS: Phosphorus: 3.6 mg/dL (ref 2.5–4.6)

## 2017-10-17 LAB — MAGNESIUM: Magnesium: 2 mg/dL (ref 1.7–2.4)

## 2017-10-17 MED ORDER — FAT EMULSION 20 % IV EMUL
250.0000 mL | INTRAVENOUS | Status: AC
  Administered 2017-10-17: 250 mL via INTRAVENOUS
  Filled 2017-10-17: qty 250

## 2017-10-17 MED ORDER — TRACE MINERALS CR-CU-MN-SE-ZN 10-1000-500-60 MCG/ML IV SOLN
INTRAVENOUS | Status: DC
  Administered 2017-10-17: 18:00:00 via INTRAVENOUS
  Filled 2017-10-17: qty 1992

## 2017-10-17 NOTE — Progress Notes (Signed)
PHARMACY - ADULT TOTAL PARENTERAL NUTRITION CONSULT NOTE   Pharmacy Consult for TPN and intralipid Indication: malnourishment secondary to ileus   Patient Measurements: Height: 5\' 11"  (180.3 cm) Weight: 195 lb 6.4 oz (88.6 kg) IBW/kg (Calculated) : 75.3 TPN AdjBW (KG): 89.4 Body mass index is 27.25 kg/m.   Assessment:   GI: ileus  Endo:  Insulin requirements in the past 24 hours: 13 Lytes: Renal: Pulm: Cards:  Hepatobil: Neuro: ID: Anti-infectives (From admission, onward)   Start     Dose/Rate Route Frequency Ordered Stop   10/14/17 1515  piperacillin-tazobactam (ZOSYN) IVPB 3.375 g     3.375 g 12.5 mL/hr over 240 Minutes Intravenous Every 8 hours 10/14/17 1507         TPN Access: 12/27  TPN start date: 12/27 Nutritional Goals (per RD recommendation on 12.27.18): KCal: 2233kcal Protein: 100gm  Fluid: 2232 ml fluid daily   Goal TPN rate is 83 ml/hr (provides 2233kcal, 100gm protein, 2232 ml fluid daily)      Current Nutrition:   Plan:  clinamix e 5/20 TPN at 83 mL/hr Multi vit, trace. thiamine MWF Continue lipids 20% @ 71ml/hr x 12 hr Electrolytes in TPN: clinamix e  novolog 0-15units SSI and adjust as needed No need for insulin in TPN bag Monitor electrolytes, glucose Mg, K, phos WNL no additional supplementation needed Will recheck in the AM. If good, can check less frequently (q 2-3 days)  Ramond Dial, Pharm.D, BCPS Clinical Pharmacist  10/17/2017,9:08 AM

## 2017-10-17 NOTE — Progress Notes (Signed)
Patient ID: Louis Davies, male   DOB: 1951-04-07, 66 y.o.   MRN: [id number]  [facility] PROGRESS NOTE  Louis Davies DOB: 05/13/1951 DOA: 10/10/2017 PCP: [facility]  HPI/Subjective: Patient still with abdominal pain and nausea.  Still has NG tube in with drainage.  Patient did not sleep very well last night.  Has not passed gas yet.  Did walk well with physical therapy.  Objective: Vitals:   10/17/17 0930 10/17/17 1508  BP: 119/69 118/64  Pulse: 75 73  Resp: 18   Temp: 97.6 F (36.4 C)   SpO2: 96% 97%    Filed Weights   10/15/17 1643 10/16/17 0500 10/17/17 0500  Weight: 89.5 kg (197 lb 6.4 oz) 91.3 kg (201 lb 4 oz) 88.6 kg (195 lb 6.4 oz)    ROS: Review of Systems  Constitutional: Negative for chills and fever.  Eyes: Negative for blurred vision.  Respiratory: Negative for cough and shortness of breath.   Cardiovascular: Negative for chest pain.  Gastrointestinal: Positive for abdominal pain and nausea. Negative for constipation, diarrhea and vomiting.  Genitourinary: Negative for dysuria.  Musculoskeletal: Negative for joint pain.  Neurological: Negative for dizziness and headaches.   Exam: Physical Exam  Constitutional: He is oriented to person, place, and time.  HENT:  Nose: No mucosal edema.  Mouth/Throat: No oropharyngeal exudate or posterior oropharyngeal edema.  Eyes: Conjunctivae, EOM and lids are normal. Pupils are equal, round, and reactive to light.  Neck: No JVD present. Carotid bruit is not present. No edema present. No thyroid mass and no thyromegaly present.  Cardiovascular: S1 normal and S2 normal. Exam reveals no gallop.  No murmur heard. Pulses:      Dorsalis pedis pulses are 2+ on the right side, and 2+ on the left side.  Respiratory: No respiratory distress. He has decreased breath sounds in the right lower field and the left lower field. He has no wheezes. He has no rhonchi. He has no rales.  GI: Soft. He  exhibits distension. Bowel sounds are decreased. There is generalized tenderness.  Musculoskeletal:       Right ankle: He exhibits no swelling.       Left ankle: He exhibits no swelling.  Lymphadenopathy:    He has no cervical adenopathy.  Neurological: He is alert and oriented to person, place, and time. No cranial nerve deficit.  Skin: Skin is warm. No rash noted. Nails show no clubbing.  Psychiatric: He has a normal mood and affect.      Data Reviewed: Basic Metabolic Panel: Recent Labs  Lab 10/11/17 0606  10/12/17 0945 10/13/17 0617 10/14/17 0551 10/15/17 0622 10/16/17 0536 10/17/17 0721  NA 143  --  142  --  137 137 138 137  K 2.7*   < > 3.0* 3.2* 3.6 3.7 3.2* 3.6  CL 106  --  103  --  104 103 102 102  CO2 27  --  28  --  27 26 31 27   GLUCOSE 105*  --  116*  --  108* 112* 137* 152*  BUN 16  --  16  --  21* 18 19 19   CREATININE 0.89  --  0.83  --  0.77 0.86 0.83 0.85  CALCIUM 8.6*  --  9.1  --  8.6* 7.9* 8.3* 8.2*  MG 1.9  --   --  1.8  --   --  1.9 2.0  PHOS  --   --   --  3.9  --   --  2.9 3.6   < > = values in this interval not displayed.   Liver Function Tests: Recent Labs  Lab 10/16/17 0536  AST 16  ALT 14*  ALKPHOS 51  BILITOT 0.6  PROT 5.8*  ALBUMIN 2.7*   No results for input(s): LIPASE, AMYLASE in the last 168 hours. CBC: Recent Labs  Lab 10/10/17 1807 10/15/17 0622 10/16/17 0536  WBC 6.5 8.5 6.8  NEUTROABS  --   --  5.1  HGB 13.4 12.5* 12.1*  HCT 41.1 38.3* 37.5*  MCV 78.2* 77.8* 77.8*  PLT 220 209 211     CBG: Recent Labs  Lab 10/16/17 2038 10/17/17 0015 10/17/17 0423 10/17/17 0744 10/17/17 1139  GLUCAP 154* 136* 143* 150* 149*      Scheduled Meds: . enoxaparin (LOVENOX) injection  40 mg Subcutaneous Q24H  . insulin aspart  0-15 Units Subcutaneous Q4H  . sodium chloride flush  10-40 mL Intracatheter Q12H  . sodium chloride flush  3 mL Intravenous Q12H   Continuous Infusions: . Marland KitchenTPN (CLINIMIX-E) Adult 83 mL/hr at  10/16/17 1834  . Marland KitchenTPN (CLINIMIX-E) Adult    . fat emulsion    . piperacillin-tazobactam (ZOSYN)  IV 3.375 g (10/17/17 1455)    Assessment/Plan:  1. Intestinal perforation with abdominal pain, and abdominal distention.  With exploratory surgery there was no obvious source of the intestinal perforation.  Patient on TPN.  Patient on empiric antibiotics.  Watch clinically at this point.  Patient still with NG tube in 2. Hypokalemia.  Replaced 3. Hypomagnesemia replaced 4. History of metastatic melanoma on immunotherapy as outpatient  Code Status:     Code Status Orders  (From admission, onward)        Start     Ordered   10/10/17 1809  Full code  Continuous     10/10/17 1808    Code Status History    Date Active Date Inactive Code Status Order ID Comments User Context   This patient has a current code status but no historical code status.    Advance Directive Documentation     Most Recent Value  Type of Advance Directive  Healthcare Power of Attorney  Pre-existing out of facility DNR order (yellow form or pink MOST form)  No data  "MOST" Form in Place?  No data      Disposition Plan: To be determined  Consultants:  General surgery  Oncology  Time spent: 25 minutes, case discussed with general surgery.  Josely Moffat [employer]

## 2017-10-17 NOTE — Progress Notes (Signed)
3 Days Post-Op  Subjective: Status post exploratory laparotomy, negative laparotomy.  Patient has not passed any gas yet but feels better overall.  He has a nasogastric tube in place  Objective: Vital signs in last 24 hours: Temp:  [97.9 F (36.6 C)-98 F (36.7 C)] 98 F (36.7 C) (12/29 0427) Pulse Rate:  [77-79] 79 (12/29 0427) Resp:  [18] 18 (12/29 0427) BP: (123-126)/(66-68) 123/68 (12/29 0427) SpO2:  [94 %-95 %] 95 % (12/29 0427) Weight:  [195 lb 6.4 oz (88.6 kg)] 195 lb 6.4 oz (88.6 kg) (12/29 0500) Last BM Date: 10/16/17(per report, mucous consistency per report)  Intake/Output from previous day: 12/28 0701 - 12/29 0700 In: 1583.6 [I.V.:1433.6; IV Piggyback:150] Out: 1350 [Urine:1350] Intake/Output this shift: No intake/output data recorded.  Physical exam:  Vital signs reviewed and stable abdomen is distended and tympanitic but nontender wound is dressed.  Calves are nontender.  Lab Results: CBC  Recent Labs    10/15/17 0622 10/16/17 0536  WBC 8.5 6.8  HGB 12.5* 12.1*  HCT 38.3* 37.5*  PLT 209 211   BMET Recent Labs    10/16/17 0536 10/17/17 0721  NA 138 137  K 3.2* 3.6  CL 102 102  CO2 31 27  GLUCOSE 137* 152*  BUN 19 19  CREATININE 0.83 0.85  CALCIUM 8.3* 8.2*   PT/INR No results for input(s): LABPROT, INR in the last 72 hours. ABG No results for input(s): PHART, HCO3 in the last 72 hours.  Invalid input(s): PCO2, PO2  Studies/Results: Dg Chest Port 1 View  Result Date: 10/15/2017 CLINICAL DATA:  Confirm line placement. EXAM: PORTABLE CHEST 1 VIEW COMPARISON:  No recent chest x-ray for review. FINDINGS: The lungs are mildly hypoinflated but clear. The heart is normal in size. The pulmonary vascularity is not engorged. A right-sided PICC line has its tip terminating over the midportion of the SVC. There is an esophagogastric tube present whose tip in proximal port project below the GE junction. IMPRESSION: Positioning of the right-sided PICC  line and the esophagogastric tube as described. Mild hypoinflation. No definite acute cardiopulmonary abnormality. Electronically Signed   By: David  Martinique M.D.   On: 10/15/2017 13:18    Anti-infectives: Anti-infectives (From admission, onward)   Start     Dose/Rate Route Frequency Ordered Stop   10/14/17 1515  piperacillin-tazobactam (ZOSYN) IVPB 3.375 g     3.375 g 12.5 mL/hr over 240 Minutes Intravenous Every 8 hours 10/14/17 1507        Assessment/Plan: s/p Procedure(s): EXPLORATORY LAPAROTOMY   Electrolytes are within normal limits this morning.  He is feeling better.  He has not yet passed any gas therefore I cannot remove the nasogastric tube at this time.  I would recommend continuing the nasogastric tube until bowel function returns.  This was discussed with prime doc.  Florene Glen, MD, FACS  10/17/2017

## 2017-10-17 NOTE — Evaluation (Signed)
Physical Therapy Evaluation Patient Details Name: Louis Davies MRN: [id number] DOB: August 13, 1951 Today's Date: 10/17/2017   History of Present Illness  Pt is a 66 y.o. male presenting to hospital with constipation and emesis.  Pt admitted with ileus.  Pt s/p exploratory laparatomy 10/14/17.  Testing demonstrating pneumoperitoneum and pneumatosis.  PMH includes metastatic melanoma and anemia.  Clinical Impression  Prior to hospital admission, pt was independent with functional mobility.  Pt's girlfriend is staying with pt (pt reports she is around a lot and can assist) and lives in 1 level home with 4-5 STE with railings.  Currently pt is SBA supine to sit; CGA with transfers; and CGA ambulating 150 feet with RW.  Overall pt demonstrating generalized weakness and deconditioning but is very motivated to get stronger and continue ambulation.  Pt encouraged to ambulate with staff in hallway 3x's/day.  Pt would benefit from skilled PT to address noted impairments and functional limitations (see below for any additional details).  Upon hospital discharge, recommend pt discharge to home with support of friend and HHPT.    Follow Up Recommendations Home health PT    Equipment Recommendations  Rolling walker with 5" wheels    Recommendations for Other Services       Precautions / Restrictions Precautions Precautions: Fall Precaution Comments: NG tube; NPO; aspiration Restrictions Weight Bearing Restrictions: No      Mobility  Bed Mobility Overal bed mobility: Needs Assistance Bed Mobility: Supine to Sit     Supine to sit: Supervision;HOB elevated     General bed mobility comments: increased effort to perform on own but no physical assist required  Transfers Overall transfer level: Needs assistance Equipment used: Rolling walker (2 wheeled) Transfers: Sit to/from [facility] Sit to Stand: Min guard Stand pivot transfers: Min guard(stand step turn bed to/from [facility])        General transfer comment: increased effort to stand and control descent sitting; CGA for safety; vc's for use of RW/UE placement  Ambulation/Gait Ambulation/Gait assistance: Min guard Ambulation Distance (Feet): 150 Feet Assistive device: Rolling walker (2 wheeled)   Gait velocity: decreased   General Gait Details: decreased B step length and foot clearance initially (improved step length and foot clearance with vc's and distance ambulated); limited distance d/t fatigue  Stairs            Wheelchair Mobility    Modified Rankin (Stroke Patients Only)       Balance Overall balance assessment: Needs assistance Sitting-balance support: No upper extremity supported;Feet supported Sitting balance-Leahy Scale: Good Sitting balance - Comments: steady sitting reaching within BOS   Standing balance support: Single extremity supported Standing balance-Leahy Scale: Poor Standing balance comment: requires single UE support for static standing balance (CGA for safety)                             Pertinent Vitals/Pain Pain Assessment: 0-10 Pain Score: 5  Pain Location: abdominal pain Pain Descriptors / Indicators: (swollen; discomfort) Pain Intervention(s): Limited activity within patient's tolerance;Monitored during session;Repositioned(Pt declined pain meds)  Vitals (HR and O2 on room air) stable and WFL throughout treatment session.    Home Living Family/patient expects to be discharged to:: Private residence Living Arrangements: Spouse/significant other(Pt's girlfriend) Available Help at Discharge: Friend(s);Available PRN/intermittently Type of Home: House Home Access: Stairs to enter Entrance Stairs-Rails: Psychiatric nurse of Steps: 4-5 Home Layout: One level Home Equipment: None      Prior Function  Level of Independence: Independent         Comments: Pt reports no recent h/o falls and that his girlfriend is around alot to help  him as needed.     Hand Dominance        Extremity/Trunk Assessment   Upper Extremity Assessment Upper Extremity Assessment: Generalized weakness    Lower Extremity Assessment Lower Extremity Assessment: Generalized weakness    Cervical / Trunk Assessment Cervical / Trunk Assessment: Normal  Communication   Communication: No difficulties  Cognition Arousal/Alertness: Awake/alert Behavior During Therapy: WFL for tasks assessed/performed Overall Cognitive Status: Within Functional Limits for tasks assessed                                        General Comments General comments (skin integrity, edema, etc.): Pt resting in bed with NG tube in place.  Nursing cleared pt for participation in physical therapy and clamped NG tube just prior to ambulation.  Pt agreeable to PT session.    Exercises     Assessment/Plan    PT Assessment Patient needs continued PT services  PT Problem List Decreased strength;Decreased activity tolerance;Decreased balance;Decreased mobility;Decreased knowledge of use of DME;Pain       PT Treatment Interventions DME instruction;Gait training;Stair training;Functional mobility training;Therapeutic activities;Therapeutic exercise;Balance training;Patient/family education    PT Goals (Current goals can be found in the Care Plan section)  Acute Rehab PT Goals Patient Stated Goal: to get stronger and go home PT Goal Formulation: With patient Time For Goal Achievement: 10/31/17 Potential to Achieve Goals: Good    Frequency Min 2X/week   Barriers to discharge        Co-evaluation               AM-PAC PT "6 Clicks" Daily Activity  Outcome Measure Difficulty turning over in bed (including adjusting bedclothes, sheets and blankets)?: A Little Difficulty moving from lying on back to sitting on the side of the bed? : A Lot Difficulty sitting down on and standing up from a chair with arms (e.g., wheelchair, bedside commode,  etc,.)?: Unable Help needed moving to and from a bed to chair (including a wheelchair)?: A Little Help needed walking in hospital room?: A Little Help needed climbing 3-5 steps with a railing? : A Little 6 Click Score: 15    End of Session Equipment Utilized During Treatment: Gait belt Activity Tolerance: Patient tolerated treatment well Patient left: in chair;with call bell/phone within reach;with chair alarm set;with family/visitor present(nursing cleared pt to have break from SCD's (pt reporting they were hot and wanted a break)) Nurse Communication: Mobility status;Precautions;Other (comment)(Pt's pain status and need to return NG tube to suction) PT Visit Diagnosis: Other abnormalities of gait and mobility (R26.89);Muscle weakness (generalized) (M62.81)    Time: 7001-7494 PT Time Calculation (min) (ACUTE ONLY): 39 min   Charges:   PT Evaluation $PT Eval Low Complexity: 1 Low PT Treatments $Therapeutic Activity: 8-22 mins   PT G Codes:   PT G-Codes **NOT FOR INPATIENT CLASS** Functional Assessment Tool Used: AM-PAC 6 Clicks Basic Mobility Functional Limitation: Mobility: Walking and moving around Mobility: Walking and Moving Around Current Status (W9675): At least 40 percent but less than 60 percent impaired, limited or restricted Mobility: Walking and Moving Around Goal Status [phone]): 0 percent impaired, limited or restricted    Leitha Bleak, PT 10/17/17, 9:29 AM [phone]

## 2017-10-18 ENCOUNTER — Inpatient Hospital Stay: Payer: Medicare Other

## 2017-10-18 LAB — GLUCOSE, CAPILLARY
GLUCOSE-CAPILLARY: 116 mg/dL — AB (ref 65–99)
GLUCOSE-CAPILLARY: 121 mg/dL — AB (ref 65–99)
GLUCOSE-CAPILLARY: 132 mg/dL — AB (ref 65–99)
GLUCOSE-CAPILLARY: 157 mg/dL — AB (ref 65–99)
GLUCOSE-CAPILLARY: 174 mg/dL — AB (ref 65–99)
Glucose-Capillary: 118 mg/dL — ABNORMAL HIGH (ref 65–99)
Glucose-Capillary: 156 mg/dL — ABNORMAL HIGH (ref 65–99)

## 2017-10-18 LAB — MAGNESIUM: Magnesium: 2 mg/dL (ref 1.7–2.4)

## 2017-10-18 LAB — POTASSIUM: Potassium: 3.4 mmol/L — ABNORMAL LOW (ref 3.5–5.1)

## 2017-10-18 LAB — PHOSPHORUS: Phosphorus: 4 mg/dL (ref 2.5–4.6)

## 2017-10-18 MED ORDER — TRACE MINERALS CR-CU-MN-SE-ZN 10-1000-500-60 MCG/ML IV SOLN
INTRAVENOUS | Status: DC
  Filled 2017-10-18: qty 1992

## 2017-10-18 MED ORDER — FAT EMULSION 20 % IV EMUL
250.0000 mL | INTRAVENOUS | Status: AC
  Administered 2017-10-18: 17:00:00 250 mL via INTRAVENOUS
  Filled 2017-10-18: qty 250

## 2017-10-18 MED ORDER — TRACE MINERALS CR-CU-MN-SE-ZN 10-1000-500-60 MCG/ML IV SOLN
INTRAVENOUS | Status: AC
  Administered 2017-10-18: 17:00:00 via INTRAVENOUS
  Filled 2017-10-18: qty 1992

## 2017-10-18 MED ORDER — POTASSIUM CHLORIDE 10 MEQ/100ML IV SOLN
10.0000 meq | INTRAVENOUS | Status: AC
  Administered 2017-10-18 (×2): 10 meq via INTRAVENOUS
  Filled 2017-10-18 (×2): qty 100

## 2017-10-18 NOTE — Progress Notes (Signed)
Patient ID: Louis Davies, male   DOB: 1951/07/18, 66 y.o.   MRN: [id number]   [facility] PROGRESS NOTE  Louis Davies VPX:[id number] DOB: [birth year] DOA: 10/10/2017 PCP: [facility]  HPI/Subjective: Patient stated he had a bowel movement last night but still has not passed gas.  Some nausea.  Some abdominal pain but less.  Objective: Vitals:   10/18/17 0922 10/18/17 1301  BP: 121/62 117/63  Pulse: 65 61  Resp: 18 18  Temp: 97.8 F (36.6 C) 97.8 F (36.6 C)  SpO2: 100% 100%    Filed Weights   10/16/17 0500 10/17/17 0500 10/18/17 0500  Weight: 91.3 kg (201 lb 4 oz) 88.6 kg (195 lb 6.4 oz) 88.4 kg (194 lb 12.8 oz)    ROS: Review of Systems  Constitutional: Negative for chills and fever.  Eyes: Negative for blurred vision.  Respiratory: Negative for cough and shortness of breath.   Cardiovascular: Negative for chest pain.  Gastrointestinal: Positive for abdominal pain and nausea. Negative for constipation, diarrhea and vomiting.  Genitourinary: Negative for dysuria.  Musculoskeletal: Negative for joint pain.  Neurological: Negative for dizziness and headaches.   Exam: Physical Exam  Constitutional: He is oriented to person, place, and time.  HENT:  Nose: No mucosal edema.  Mouth/Throat: No oropharyngeal exudate or posterior oropharyngeal edema.  Eyes: Conjunctivae, EOM and lids are normal. Pupils are equal, round, and reactive to light.  Neck: No JVD present. Carotid bruit is not present. No edema present. No thyroid mass and no thyromegaly present.  Cardiovascular: S1 normal and S2 normal. Exam reveals no gallop.  No murmur heard. Pulses:      Dorsalis pedis pulses are 2+ on the right side, and 2+ on the left side.  Respiratory: No respiratory distress. He has decreased breath sounds in the right lower field and the left lower field. He has no wheezes. He has no rhonchi. He has no rales.  GI: Soft. Bowel sounds are normal. He exhibits distension.  There is generalized tenderness.  Musculoskeletal:       Right ankle: He exhibits no swelling.       Left ankle: He exhibits no swelling.  Lymphadenopathy:    He has no cervical adenopathy.  Neurological: He is alert and oriented to person, place, and time. No cranial nerve deficit.  Skin: Skin is warm. No rash noted. Nails show no clubbing.  Psychiatric: He has a normal mood and affect.      Data Reviewed: Basic Metabolic Panel: Recent Labs  Lab 10/12/17 0945 10/13/17 0617 10/14/17 0551 10/15/17 0622 10/16/17 0536 10/17/17 0721 10/18/17 0423  NA 142  --  137 137 138 137  --   K 3.0* 3.2* 3.6 3.7 3.2* 3.6 3.4*  CL 103  --  104 103 102 102  --   CO2 28  --  27 26 31 27   --   GLUCOSE 116*  --  108* 112* 137* 152*  --   BUN 16  --  21* 18 19 19   --   CREATININE 0.83  --  0.77 0.86 0.83 0.85  --   CALCIUM 9.1  --  8.6* 7.9* 8.3* 8.2*  --   MG  --  1.8  --   --  1.9 2.0 2.0  PHOS  --  3.9  --   --  2.9 3.6 4.0   Liver Function Tests: Recent Labs  Lab 10/16/17 0536  AST 16  ALT 14*  ALKPHOS 51  BILITOT  0.6  PROT 5.8*  ALBUMIN 2.7*   CBC: Recent Labs  Lab 10/15/17 0622 10/16/17 0536  WBC 8.5 6.8  NEUTROABS  --  5.1  HGB 12.5* 12.1*  HCT 38.3* 37.5*  MCV 77.8* 77.8*  PLT 209 211     CBG: Recent Labs  Lab 10/17/17 2010 10/17/17 2359 10/18/17 0523 10/18/17 0801 10/18/17 1158  GLUCAP 130* 132* 174* 157* 116*      Scheduled Meds: . enoxaparin (LOVENOX) injection  40 mg Subcutaneous Q24H  . insulin aspart  0-15 Units Subcutaneous Q4H  . sodium chloride flush  10-40 mL Intracatheter Q12H  . sodium chloride flush  3 mL Intravenous Q12H   Continuous Infusions: . Marland KitchenTPN (CLINIMIX-E) Adult 83 mL/hr at 10/17/17 1804  . Marland KitchenTPN (CLINIMIX-E) Adult    . fat emulsion    . piperacillin-tazobactam (ZOSYN)  IV 3.375 g (10/18/17 1436)    Assessment/Plan:  1. Intestinal perforation with abdominal pain, and abdominal distention.  With exploratory surgery there  was no obvious source of the intestinal perforation.  Patient on TPN.  Patient on empiric antibiotics.  Watch clinically at this point.  Patient still with NG tube in.  Patient has not passed gas yet.  Did have a bowel movement last night. 2. Hypokalemia.  Replace with TPN 3. Hypomagnesemia replaced 4. History of metastatic melanoma on immunotherapy as outpatient  Code Status:     Code Status Orders  (From admission, onward)        Start     Ordered   10/10/17 1809  Full code  Continuous     10/10/17 1808    Code Status History    Date Active Date Inactive Code Status Order ID Comments User Context   This patient has a current code status but no historical code status.    Advance Directive Documentation     Most Recent Value  Type of Advance Directive  Healthcare Power of Attorney  Pre-existing out of facility DNR order (yellow form or pink MOST form)  No data  "MOST" Form in Place?  No data      Disposition Plan: To be determined  Consultants:  General surgery  Oncology  Time spent: 24 minutes, case discussed with general surgery.  Louis Davies

## 2017-10-18 NOTE — Progress Notes (Addendum)
PHARMACY - ADULT TOTAL PARENTERAL NUTRITION CONSULT NOTE   Pharmacy Consult for TPN and intralipid Indication: malnourishment secondary to ileus   Patient Measurements: Height: 5\' 11"  (180.3 cm) Weight: 194 lb 12.8 oz (88.4 kg) IBW/kg (Calculated) : 75.3 TPN AdjBW (KG): 89.4 Body mass index is 27.17 kg/m.   Assessment:   GI: ileus  Endo:  Insulin requirements in the past 24 hours: 11 Lytes: Renal: Pulm: Cards:  Hepatobil: Neuro: ID: Anti-infectives (From admission, onward)   Start     Dose/Rate Route Frequency Ordered Stop   10/14/17 1515  piperacillin-tazobactam (ZOSYN) IVPB 3.375 g     3.375 g 12.5 mL/hr over 240 Minutes Intravenous Every 8 hours 10/14/17 1507         TPN Access: 12/27  TPN start date: 12/27 Nutritional Goals (per RD recommendation on 12.27.18): KCal: 2233kcal Protein: 100gm  Fluid: 2232 ml fluid daily   Goal TPN rate is 83 ml/hr (provides 2233kcal, 100gm protein, 2232 ml fluid daily)      Current Nutrition:   Plan:  clinamix e 5/20 TPN at 83 mL/hr Multi vit, trace. Thiamine x 2 more doses (today and tomorrow then can d/c thiamine) Continue lipids 20% @ 31ml/hr x 12 hr Electrolytes in TPN: clinamix e  novolog 0-15units SSI and adjust as needed No need for insulin in TPN bag currently Monitor electrolytes, glucose K slightly low @ 3.4. Will give 2 runs of KCL IV 10 MEQ Will check electrolytes in 2 days since they have been stable Weekly CBC, diff, CMP, TG and prealbumin due tomorrow  Ramond Dial, Pharm.D, BCPS Clinical Pharmacist  10/18/2017,8:29 AM

## 2017-10-18 NOTE — Progress Notes (Signed)
Heard patient gagging in his room from the nurses station. Upon entering the room the pt was trying to vomit, and gagging a lot. When he could finally say what was wrong he mentioned "The tube is gagging me"- meaning his NG tube. Zofran given, along with comfort. Pt is now in room, calm, and feeling better.

## 2017-10-18 NOTE — Progress Notes (Signed)
NG was leaking from the connection site. Linens changed. NG tubing adjusted. NG still set to suction and working.

## 2017-10-18 NOTE — Progress Notes (Signed)
Louis Davies Day(s): 6.   Post op day(s): 4 Days Post-Op.   Interval History: Patient seen and examined, no acute events or new complaints overnight. Patient reports he feels better today than he has in several weeks with improved abdominal pain and decreased abdominal distention/"bloating". However, his NG tube has drained >1 Liter brown fluid today before "falling out" late in the day. Patient says he passed a small BM, but reports only one or two isolated single episodes of flatus, currently denies N/V, fever/chills, CP, or SOB, has been ambulating.  Review of Systems:  Constitutional: denies fever, chills  HEENT: denies cough or congestion  Respiratory: denies any shortness of breath  Cardiovascular: denies chest pain or palpitations  Gastrointestinal: abdominal pain, N/V, and bowel function as per interval history Genitourinary: denies burning with urination or urinary frequency Musculoskeletal: denies pain, decreased motor or sensation Integumentary: denies any other rashes or skin discolorations except post-surgical abdominal wounds Neurological: denies HA or vision/hearing changes   Vital signs in last 24 hours: [min-max] current  Temp:  [97.6 F (36.4 C)-98 F (36.7 C)] 98 F (36.7 C) (12/29 2016) Pulse Rate:  [73-82] 82 (12/29 2016) Resp:  [18] 18 (12/29 2016) BP: (118-134)/(64-78) 134/78 (12/29 2016) SpO2:  [96 %-98 %] 98 % (12/29 2016) Weight:  [194 lb 12.8 oz (88.4 kg)] 194 lb 12.8 oz (88.4 kg) (12/30 0500)     Height: 5\' 11"  (180.3 cm) Weight: 194 lb 12.8 oz (88.4 kg) BMI (Calculated): 27.18   Intake/Output this shift:  No intake/output data recorded.   Intake/Output last 2 shifts:  @IOLAST2SHIFTS @   Physical Exam:  Constitutional: alert, cooperative and no distress  HENT: normocephalic without obvious abnormality  Eyes: PERRL, EOM's grossly intact and symmetric  Neuro: CN II - XII grossly intact and symmetric without deficit   Respiratory: breathing non-labored at rest  Cardiovascular: regular rate and sinus rhythm  Gastrointestinal: soft with mild peri-incisional tenderness to palpation and mild abdominal distention, post-surgical laparotomy abdominal incision well-approximated without any erythema or drainage, NG tube sitting at bedside with brown fluid in tubing and canister Musculoskeletal: UE and LE FROM, no edema or wounds, motor and sensation grossly intact, NT Integumentary: Bulky non-tender minimally erythematous Left axillary mass  Labs:  CBC Latest Ref Rng & Units 10/16/2017 10/15/2017 10/10/2017  WBC 3.8 - 10.6 K/uL 6.8 8.5 6.5  Hemoglobin 13.0 - 18.0 g/dL 12.1(L) 12.5(L) 13.4  Hematocrit 40.0 - 52.0 % 37.5(L) 38.3(L) 41.1  Platelets 150 - 440 K/uL 211 209 220   CMP Latest Ref Rng & Units 10/18/2017 10/17/2017 10/16/2017  Glucose 65 - 99 mg/dL - 152(H) 137(H)  BUN 6 - 20 mg/dL - 19 19  Creatinine 0.61 - 1.24 mg/dL - 0.85 0.83  Sodium 135 - 145 mmol/L - 137 138  Potassium 3.5 - 5.1 mmol/L 3.4(L) 3.6 3.2(L)  Chloride 101 - 111 mmol/L - 102 102  CO2 22 - 32 mmol/L - 27 31  Calcium 8.9 - 10.3 mg/dL - 8.2(L) 8.3(L)  Total Protein 6.5 - 8.1 g/dL - - 5.8(L)  Total Bilirubin 0.3 - 1.2 mg/dL - - 0.6  Alkaline Phos 38 - 126 U/L - - 51  AST 15 - 41 U/L - - 16  ALT 17 - 63 U/L - - 14(L)   Imaging studies: No new pertinent imaging studies   Assessment/Plan: (ICD-10's: K59.00, K66.8) 66 y.o. male with likely slowly improving/resolving post-operative ileus and TPN for prolonged NPO 4 Days Post-Op s/p unrevealing uneventful exploratory  laparotomy for pneumoperitoneum, complicated by pertinent comorbidities including recent immunotherapy for metastatic melanoma, mild chronic anemia, and osteoarthritis.   - pain control prn (minimize narcotics)  - continue IV antibiotics and NPO with TPN, check and correct am electrolytes  - discussed replacement of NG tube now vs monitoring for abdominal distention and N/V    - patient agrees to have NG tube replaced, discussed replacement of NG tube with RN, will replace   - medical management of comorbidities as per primary medical team  - DVT prophylaxis, ambulation encouraged   All of the above findings and recommendations were discussed with the patient, patient's wife, and patient's RN, and all of patient's and family's questions were answered to their expressed satisfaction.  Thank you for the opportunity to participate in this patient's care.  -- Louis Drivers Rosana Hoes, MD, [place]: [facility] - Partnering for exceptional care. Office: [phone]

## 2017-10-18 NOTE — Progress Notes (Signed)
Was called into pt room and found NG tube on the counter. Pt stated he had a "sneezing fit" and it came dislodged. Paged on call doctor (Dr. Jerelyn Charles) and asked what he wanted me to do. He requested that I replace it.

## 2017-10-18 NOTE — Progress Notes (Addendum)
Nutrition Follow-up  DOCUMENTATION CODES:   Not applicable  INTERVENTION:   Recommend continue Clinimix 5/20 with electrolytes @ goal rate of 11ml/hr until pt is able to tolerate 100% of a full liquid diet   Continue 20% lipids @20ml /hr x 12 hrs/day   Regimen provides 2233kcal/day, 100g/day protein, 2242ml volume  Continue MVI daily   Continue trace elements  Discontinue thiamine 100mg  daily after 3 doses   Daily weights  NUTRITION DIAGNOSIS:   Inadequate oral intake related to nausea, vomiting, other (see comment)(abdominal pain and distention) as evidenced by per patient/family report.  Ongoing - patient is now NPO.  GOAL:   Patient will meet greater than or equal to 90% of their needs  -meeting with TPN.  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  Pt tolerating TPN well. Pt NPO; awaiting return of bowel function. NGT in place. Per chart, pt with 3lb weight loss since admit. RD will continue to monitor weights. Recommend continue TPN until patient is able to tolerate 100% of a full liquid diet.    Medications reviewed and include: lovenox, insulin, zosyn, KCl, morphine, zofran  Labs reviewed: K 3.4(L), P 4.0 wnl, Mg 2.0 wnl Triglycerides- 109- 12/28 cbgs- 112, 137, 152 x 48 hrs  Diet Order:  Diet NPO time specified Except for: Ice Chips .TPN (CLINIMIX-E) Adult .TPN (CLINIMIX-E) Adult  EDUCATION NEEDS:   No education needs have been identified at this time  Skin:  Closed incision to abdomen  Last BM:  10/13/2017 - large type 5  Height:   Ht Readings from Last 1 Encounters:  10/10/17 5\' 11"  (1.803 m)    Weight:   Wt Readings from Last 1 Encounters:  10/18/17 194 lb 12.8 oz (88.4 kg)    Ideal Body  Weight:  78.2 kg  BMI:  Body mass index is 27.17 kg/m.  Estimated Nutritional Needs:   Kcal:  2015-2350 (MSJ x 1.2-1.4)  Protein:  90-105 grams (1-1.2 grams/kg)  Fluid:  2.2-2.6 L/day (25-30 mL/kg)  Koleen Distance MS, RD, LDN Pager #- [phone] After Hours Pager: [phone]

## 2017-10-19 LAB — GLUCOSE, CAPILLARY
GLUCOSE-CAPILLARY: 140 mg/dL — AB (ref 65–99)
GLUCOSE-CAPILLARY: 109 mg/dL — AB (ref 65–99)
Glucose-Capillary: 137 mg/dL — ABNORMAL HIGH (ref 65–99)
Glucose-Capillary: 145 mg/dL — ABNORMAL HIGH (ref 65–99)
Glucose-Capillary: 121 mg/dL — ABNORMAL HIGH (ref 65–99)

## 2017-10-19 LAB — COMPREHENSIVE METABOLIC PANEL
ALBUMIN: 2.7 g/dL — AB (ref 3.5–5.0)
ALK PHOS: 49 U/L (ref 38–126)
ALT: 52 U/L (ref 17–63)
AST: 43 U/L — AB (ref 15–41)
Anion gap: 10 (ref 5–15)
BUN: 22 mg/dL — AB (ref 6–20)
CALCIUM: 8.4 mg/dL — AB (ref 8.9–10.3)
CO2: 26 mmol/L (ref 22–32)
CREATININE: 0.83 mg/dL (ref 0.61–1.24)
Chloride: 101 mmol/L (ref 101–111)
GFR calc non Af Amer: >60 mL/min (ref >60)
GLUCOSE: 152 mg/dL — AB (ref 65–99)
Potassium: 3.7 mmol/L (ref 3.5–5.1)
SODIUM: 137 mmol/L (ref 135–145)
Total Bilirubin: 0.4 mg/dL (ref 0.3–1.2)
Total Protein: 6.3 g/dL — ABNORMAL LOW (ref 6.5–8.1)

## 2017-10-19 LAB — CBC
HCT: 37 % — ABNORMAL LOW (ref 40.0–52.0)
Hemoglobin: 12.1 g/dL — ABNORMAL LOW (ref 13.0–18.0)
MCH: 25.4 pg — AB (ref 26.0–34.0)
MCHC: 32.6 g/dL (ref 32.0–36.0)
MCV: 77.9 fL — ABNORMAL LOW (ref 80.0–100.0)
PLATELETS: 241 10*3/uL (ref 150–440)
RBC: 4.75 MIL/uL (ref 4.40–5.90)
RDW: 15.7 % — AB (ref 11.5–14.5)
WBC: 4.8 10*3/uL (ref 3.8–10.6)

## 2017-10-19 LAB — DIFFERENTIAL
Basophils Absolute: 0 10*3/uL (ref 0–0.1)
Basophils Relative: 1 %
EOS ABS: 0.2 10*3/uL (ref 0–0.7)
EOS PCT: 4 %
LYMPHS ABS: 0.7 10*3/uL — AB (ref 1.0–3.6)
Lymphocytes Relative: 14 %
MONO ABS: 0.7 10*3/uL (ref 0.2–1.0)
MONOS PCT: 14 %
Neutro Abs: 3.3 10*3/uL (ref 1.4–6.5)
Neutrophils Relative %: 67 %

## 2017-10-19 LAB — TRIGLYCERIDES: Triglycerides: 140 mg/dL (ref <150)

## 2017-10-19 LAB — PREALBUMIN: Prealbumin: 13.4 mg/dL — ABNORMAL LOW (ref 18–38)

## 2017-10-19 MED ORDER — FAT EMULSION 20 % IV EMUL
250.0000 mL | INTRAVENOUS | Status: AC
  Administered 2017-10-19: 17:00:00 250 mL via INTRAVENOUS
  Filled 2017-10-19: qty 250

## 2017-10-19 MED ORDER — TRACE MINERALS CR-CU-MN-SE-ZN 10-1000-500-60 MCG/ML IV SOLN
INTRAVENOUS | Status: AC
  Administered 2017-10-19: 17:00:00 via INTRAVENOUS
  Filled 2017-10-19: qty 1992

## 2017-10-19 NOTE — Progress Notes (Signed)
Physical Therapy Treatment Patient Details Name: Louis Davies MRN: [id number] DOB: [birth year] Today's Date: 10/19/2017    History of Present Illness Pt is a 66 y.o. male presenting to hospital with constipation and emesis.  Pt admitted with ileus.  Pt s/p exploratory laparatomy 10/14/17.  Testing demonstrating pneumoperitoneum and pneumatosis.  PMH includes metastatic melanoma and anemia.    PT Comments    Pt able to progress to ambulating 3 laps around nursing loop (no assistive device); mild increased B lateral sway noted with ambulation (no loss of balance with functional mobility though).  Higher level balance deficits noted during session.  Pain 2-3/10 abdomen end of session (pt c/o nausea; nursing notified and brought pt nausea meds).  Overall pt progressing well with functional mobility during hospital stay although demonstrates decreased activity tolerance and strength compared to baseline; pt encouraged to continue ambulating with staff 3x's/day during hospitalization.   Follow Up Recommendations  Outpatient PT     Equipment Recommendations  None recommended by PT    Recommendations for Other Services       Precautions / Restrictions Precautions Precautions: Fall Precaution Comments: NG tube; NPO; aspiration Restrictions Weight Bearing Restrictions: No    Mobility  Bed Mobility Overal bed mobility: Modified Independent Bed Mobility: Supine to Sit     Supine to sit: Modified independent (Device/Increase time)     General bed mobility comments: no difficulties noted supine to sit  Transfers Overall transfer level: Needs assistance Equipment used: None Transfers: Sit to/from Stand Sit to Stand: Supervision         General transfer comment: x2 trials standing from bed SBA (mild increased effort to stand noted)  Ambulation/Gait Ambulation/Gait assistance: Min guard Ambulation Distance (Feet): 500 Feet Assistive device: None   Gait velocity: decreased  but improved velocity with distance   General Gait Details: decreased B step length and foot clearance initially (improved with distance ambulated and vc's for technique); mild increased B lateral sway noted but no loss of balance noted; vc's for B UE arm swing (pt tending to keep B UE's at side)   Science writer    Modified Rankin (Stroke Patients Only)       Balance Overall balance assessment: Needs assistance Sitting-balance support: No upper extremity supported;Feet supported Sitting balance-Leahy Scale: Normal Sitting balance - Comments: steady sitting reaching outside BOS   Standing balance support: No upper extremity supported;During functional activity Standing balance-Leahy Scale: Good Standing balance comment: increased lateral sway with ambulation; good standing balance reaching within BOS but holding onto objects when reaching outside BOS                            Cognition Arousal/Alertness: Awake/alert Behavior During Therapy: WFL for tasks assessed/performed Overall Cognitive Status: Within Functional Limits for tasks assessed                                        Exercises      General Comments General comments (skin integrity, edema, etc.): Pt resting in bed with NG tube in place upon PT arrival.  Nursing cleared pt for participation in physical therapy and disconnected NG tube for therapy session.  Pt agreeable to PT session.      Pertinent Vitals/Pain Pain Assessment: 0-10 Pain Score: 3  Pain Location:  abdominal pain Pain Descriptors / Indicators: Discomfort Pain Intervention(s): Limited activity within patient's tolerance;Monitored during session;Premedicated before session;Repositioned  Vitals (HR and O2 on room air) stable and WFL throughout treatment session.    Home Living                      Prior Function            PT Goals (current goals can now be found in the care  plan section) Acute Rehab PT Goals Patient Stated Goal: to get stronger and go home PT Goal Formulation: With patient Time For Goal Achievement: 10/31/17 Potential to Achieve Goals: Good Progress towards PT goals: Progressing toward goals    Frequency    Min 2X/week      PT Plan Discharge plan needs to be updated    Co-evaluation              AM-PAC PT "6 Clicks" Daily Activity  Outcome Measure  Difficulty turning over in bed (including adjusting bedclothes, sheets and blankets)?: None Difficulty moving from lying on back to sitting on the side of the bed? : None Difficulty sitting down on and standing up from a chair with arms (e.g., wheelchair, bedside commode, etc,.)?: A Little Help needed moving to and from a bed to chair (including a wheelchair)?: A Little Help needed walking in hospital room?: A Little Help needed climbing 3-5 steps with a railing? : A Little 6 Click Score: 20    End of Session Equipment Utilized During Treatment: Gait belt Activity Tolerance: Patient tolerated treatment well Patient left: in chair;with call bell/phone within reach;with chair alarm set;with nursing/sitter in room Nurse Communication: Mobility status;Precautions;Other (comment)(Pt requesting nausea meds and needing to have NG tube re-connected.) PT Visit Diagnosis: Other abnormalities of gait and mobility (R26.89);Muscle weakness (generalized) (M62.81)     Time: 0998-3382 PT Time Calculation (min) (ACUTE ONLY): 23 min  Charges:  $Gait Training: 8-22 mins $Therapeutic Exercise: 8-22 mins                    G CodesLeitha Bleak, PT 10/19/17, 11:51 AM [phone]

## 2017-10-19 NOTE — Progress Notes (Signed)
[facility] Hospital Day(s): 7.   Post op day(s): 5 Days Post-Op.   Interval History: Patient seen and examined, no acute events or new complaints since NG tube reinserted. Patient reports he's glad he agreed to have NG tube reinserted and states he now feels definite "rumbling" with NG tube drainage still nearly 1 Liter over 24 hours, but appears to be starting to slow. Patient also states he slept well for the first time during his hospital admission and describes he feels "refreshed" and overall "much better" today, denies N/V, fever/chills, CP, or SOB.  Review of Systems:  Constitutional: denies fever, chills  HEENT: denies cough or congestion  Respiratory: denies any shortness of breath  Cardiovascular: denies chest pain or palpitations  Gastrointestinal: abdominal pain, N/V, and bowel function as per interval history Genitourinary: denies burning with urination or urinary frequency Musculoskeletal: denies pain, decreased motor or sensation Integumentary: denies any other rashes or skin discolorations except post-surgical abdominal wound Neurological: denies HA or vision/hearing changes   Vital signs in last 24 hours: [min-max] current  Temp:  [97.8 F (36.6 C)-99.1 F (37.3 C)] 99.1 F (37.3 C) (12/31 0355) Pulse Rate:  [61-79] 75 (12/31 0355) Resp:  [18] 18 (12/31 0355) BP: (117-132)/(62-71) 132/69 (12/31 0355) SpO2:  [96 %-100 %] 96 % (12/31 0355) Weight:  [193 lb (87.5 kg)] 193 lb (87.5 kg) (12/31 0355)     Height: 5\' 11"  (180.3 cm) Weight: 193 lb (87.5 kg) BMI (Calculated): 26.93   Intake/Output this shift:  No intake/output data recorded.   Intake/Output last 2 shifts:  @IOLAST2SHIFTS @   Physical Exam:  Constitutional: alert, cooperative and no distress  HENT: normocephalic without obvious abnormality  Eyes: PERRL, EOM's grossly intact and symmetric  Neuro: CN II - XII grossly intact and symmetric without deficit  Respiratory: breathing non-labored  at rest  Cardiovascular: regular rate and sinus rhythm  Gastrointestinal: soft, non-tender, and non-distended, post-surgical abdominal wound well-approximated without surrounding erythema or drainage, NG tube secured Musculoskeletal: UE and LE FROM, no edema or wounds, motor and sensation grossly intact, NT Integumentary: Bulky non-tender minimally erythematous Left axillary mass  Labs:  CBC Latest Ref Rng & Units 10/19/2017 10/16/2017 10/15/2017  WBC 3.8 - 10.6 K/uL 4.8 6.8 8.5  Hemoglobin 13.0 - 18.0 g/dL 12.1(L) 12.1(L) 12.5(L)  Hematocrit 40.0 - 52.0 % 37.0(L) 37.5(L) 38.3(L)  Platelets 150 - 440 K/uL 241 211 209   CMP Latest Ref Rng & Units 10/19/2017 10/18/2017 10/17/2017  Glucose 65 - 99 mg/dL 152(H) - 152(H)  BUN 6 - 20 mg/dL 22(H) - 19  Creatinine 0.61 - 1.24 mg/dL 0.83 - 0.85  Sodium 135 - 145 mmol/L 137 - 137  Potassium 3.5 - 5.1 mmol/L 3.7 3.4(L) 3.6  Chloride 101 - 111 mmol/L 101 - 102  CO2 22 - 32 mmol/L 26 - 27  Calcium 8.9 - 10.3 mg/dL 8.4(L) - 8.2(L)  Total Protein 6.5 - 8.1 g/dL 6.3(L) - -  Total Bilirubin 0.3 - 1.2 mg/dL 0.4 - -  Alkaline Phos 38 - 126 U/L 49 - -  AST 15 - 41 U/L 43(H) - -  ALT 17 - 63 U/L 52 - -   Imaging studies:  Abdominal X-ray for NG tube replacement (10/18/2017) NG tube in the mid stomach. Mildly Distended large and small bowel loops, probable postop ileus.   Assessment/Plan: (ICD-10's: K59.00, K75.8) 66 y.o. male with likely slowly improving/resolving post-operative ileus and TPN for prolonged NPO 5 Days Post-Op s/p unrevealing uneventful exploratory laparotomy  for pneumoperitoneum, complicated by pertinent comorbidities including recent immunotherapy for metastatic melanoma, mild chronic anemia, and osteoarthritis.              - pain control prn (minimize narcotics)             - continue IV antibiotics and NPO with TPN, check and correct am electrolytes  - monitor ongoing abdominal exam and bowel function, anticipate flatus soon               - medical management of comorbidities as per primary medical team             - DVT prophylaxis, ambulation encouraged   All of the above findings and recommendations were discussed with the patient, patient's wife, and patient's RN, and all of patient's and family's questions were answered to their expressed satisfaction.  Thank you for the opportunity to participate in this patient's care.  -- Marilynne Drivers Rosana Hoes, MD, [place]: [facility] - Partnering for exceptional care. Office: [phone]

## 2017-10-19 NOTE — Progress Notes (Signed)
Patient ID: Louis Davies, male   DOB: 1951-05-05, 66 y.o.   MRN: [id number]   [facility] PROGRESS NOTE  Louis Davies AST:[id number] DOB: 1950/12/09 DOA: 10/10/2017 PCP: [facility]  HPI/Subjective: Patient feeling a little bit better.  States he has been ambulating.  Has not passed gas when I saw him earlier.  Still with abdominal distention.  Objective: Vitals:   10/19/17 0355 10/19/17 1311  BP: 132/69 113/66  Pulse: 75 63  Resp: 18 20  Temp: 99.1 F (37.3 C) 97.9 F (36.6 C)  SpO2: 96% 97%    Filed Weights   10/17/17 0500 10/18/17 0500 10/19/17 0355  Weight: 88.6 kg (195 lb 6.4 oz) 88.4 kg (194 lb 12.8 oz) 87.5 kg (193 lb)    ROS: Review of Systems  Constitutional: Negative for chills and fever.  Eyes: Negative for blurred vision.  Respiratory: Negative for cough and shortness of breath.   Cardiovascular: Negative for chest pain.  Gastrointestinal: Positive for abdominal pain and nausea. Negative for constipation, diarrhea and vomiting.  Genitourinary: Negative for dysuria.  Musculoskeletal: Negative for joint pain.  Neurological: Negative for dizziness and headaches.   Exam: Physical Exam  Constitutional: He is oriented to person, place, and time.  HENT:  Nose: No mucosal edema.  Mouth/Throat: No oropharyngeal exudate or posterior oropharyngeal edema.  Eyes: Conjunctivae, EOM and lids are normal. Pupils are equal, round, and reactive to light.  Neck: No JVD present. Carotid bruit is not present. No edema present. No thyroid mass and no thyromegaly present.  Cardiovascular: S1 normal and S2 normal. Exam reveals no gallop.  No murmur heard. Pulses:      Dorsalis pedis pulses are 2+ on the right side, and 2+ on the left side.  Respiratory: No respiratory distress. He has decreased breath sounds in the right lower field and the left lower field. He has no wheezes. He has no rhonchi. He has no rales.  GI: Soft. Bowel sounds are normal. He exhibits  distension. There is generalized tenderness.  Musculoskeletal:       Right ankle: He exhibits no swelling.       Left ankle: He exhibits no swelling.  Lymphadenopathy:    He has no cervical adenopathy.  Neurological: He is alert and oriented to person, place, and time. No cranial nerve deficit.  Skin: Skin is warm. No rash noted. Nails show no clubbing.  Psychiatric: He has a normal mood and affect.      Data Reviewed: Basic Metabolic Panel: Recent Labs  Lab 10/13/17 0617 10/14/17 0551 10/15/17 0622 10/16/17 0536 10/17/17 0721 10/18/17 0423 10/19/17 0326  NA  --  137 137 138 137  --  137  K 3.2* 3.6 3.7 3.2* 3.6 3.4* 3.7  CL  --  104 103 102 102  --  101  CO2  --  27 26 31 27   --  26  GLUCOSE  --  108* 112* 137* 152*  --  152*  BUN  --  21* 18 19 19   --  22*  CREATININE  --  0.77 0.86 0.83 0.85  --  0.83  CALCIUM  --  8.6* 7.9* 8.3* 8.2*  --  8.4*  MG 1.8  --   --  1.9 2.0 2.0  --   PHOS 3.9  --   --  2.9 3.6 4.0  --    Liver Function Tests: Recent Labs  Lab 10/16/17 0536 10/19/17 0326  AST 16 43*  ALT 14* 52  ALKPHOS 51 49  BILITOT 0.6 0.4  PROT 5.8* 6.3*  ALBUMIN 2.7* 2.7*   CBC: Recent Labs  Lab 10/15/17 0622 10/16/17 0536 10/19/17 0326  WBC 8.5 6.8 4.8  NEUTROABS  --  5.1 3.3  HGB 12.5* 12.1* 12.1*  HCT 38.3* 37.5* 37.0*  MCV 77.8* 77.8* 77.9*  PLT 209 211 241     CBG: Recent Labs  Lab 10/18/17 2014 10/18/17 2331 10/19/17 0352 10/19/17 0751 10/19/17 1147  GLUCAP 118* 156* 137* 145* 121*      Scheduled Meds: . enoxaparin (LOVENOX) injection  40 mg Subcutaneous Q24H  . insulin aspart  0-15 Units Subcutaneous Q4H  . sodium chloride flush  10-40 mL Intracatheter Q12H  . sodium chloride flush  3 mL Intravenous Q12H   Continuous Infusions: . Marland KitchenTPN (CLINIMIX-E) Adult 83 mL/hr at 10/18/17 1722  . Marland KitchenTPN (CLINIMIX-E) Adult    . fat emulsion    . piperacillin-tazobactam (ZOSYN)  IV 3.375 g (10/19/17 1557)     Assessment/Plan:  1. Intestinal perforation with abdominal pain, and abdominal distention.  With exploratory surgery there was no obvious source of the intestinal perforation.  Patient on TPN.  Patient on empiric antibiotics.  Watch clinically at this point.  Patient still with NG tube in.  Patient has not passed gas yet.  Surgical team following 2. Hypokalemia.  Replace with TPN 3. Hypomagnesemia replaced 4. History of metastatic melanoma on immunotherapy as outpatient  Code Status:     Code Status Orders  (From admission, onward)        Start     Ordered   10/10/17 1809  Full code  Continuous     10/10/17 1808    Code Status History    Date Active Date Inactive Code Status Order ID Comments User Context   This patient has a current code status but no historical code status.    Advance Directive Documentation     Most Recent Value  Type of Advance Directive  Healthcare Power of Attorney  Pre-existing out of facility DNR order (yellow form or pink MOST form)  No data  "MOST" Form in Place?  No data      Disposition Plan: To be determined  Consultants:  General surgery  Oncology  Time spent: 22 minutes  [place]

## 2017-10-19 NOTE — Progress Notes (Signed)
PHARMACY - ADULT TOTAL PARENTERAL NUTRITION CONSULT NOTE   Pharmacy Consult for TPN and intralipid Indication: malnourishment secondary to ileus   Patient Measurements: Height: 5\' 11"  (180.3 cm) Weight: 193 lb (87.5 kg) IBW/kg (Calculated) : 75.3 TPN AdjBW (KG): 89.4 Body mass index is 26.92 kg/m.   Assessment:   GI: ileus  Endo:  Insulin requirements in the past 24 hours: 11 Lytes: Renal: Pulm: Cards:  Hepatobil: Neuro: ID: Anti-infectives (From admission, onward)   Start     Dose/Rate Route Frequency Ordered Stop   10/14/17 1515  piperacillin-tazobactam (ZOSYN) IVPB 3.375 g     3.375 g 12.5 mL/hr over 240 Minutes Intravenous Every 8 hours 10/14/17 1507         TPN Access: 12/27  TPN start date: 12/27 Nutritional Goals (per RD recommendation on 12.27.18): KCal: 2233kcal Protein: 100gm  Fluid: 2232 ml fluid daily   Goal TPN rate is 83 ml/hr (provides 2233kcal, 100gm protein, 2232 ml fluid daily)      Current Nutrition:   Plan:  clinamix e 5/20 TPN at 83 mL/hr Multi vit, trace. Thiamine x 1 more doses (today then can d/c thiamine) Continue lipids 20% @ 62ml/hr x 12 hr Electrolytes in TPN: clinamix e  novolog 0-15units SSI and adjust as needed No need for insulin in TPN bag currently Monitor electrolytes, glucose K WNL Will check electrolytes on wedensday, electrolytes have been stable. Can check q 2-3 days Weekly CBC, diff, CMP, TG and prealbumin  Keyla Milone D Keelia Graybill, Pharm.D, BCPS Clinical Pharmacist  10/19/2017,10:30 AM

## 2017-10-19 NOTE — Care Management Note (Signed)
Case Management Note  Patient Details  Name: THADDUS MCDOWELL MRN: [id number] Date of Birth: [birth year]  Subjective/Objective:  Admitted with possible bowel obstruction. Exploratory Laparotomy was done 10/14/17. Lives alone Denman George is [place] [phone]).  Last seen Nurse Practitioner Raquel Sarna in July at [facility]. Sees Dr. Grayland Ormond at the cancer. Last immunotherapy treatment was 09/23/17. Prescriptions are filled at [facility] in [facility]. No home Health. No skilled facility.  No home oxygen and no equipment in the home. Self employed and still works. Takes care of all basic activities of daily living himself, drives. No falls. Lost 5-10 pounds since being sick. Friend will transport               Action/Plan: Physical therapy evaluation completed. Recommending home with home health and therapy. Discussed agencies, [place]. May need TPN when discharged. Update to [employer], [facility] representative. They would be able to go to [place].   Expected Discharge Date:  10/12/17               Expected Discharge Plan:     In-House Referral:   yes  Discharge planning Services     Post Acute Care Choice:   yes Choice offered to:   Mr. Overbeck  DME Arranged:    DME Agency:     [facility] Arranged:   Not yet HH Agency:   [facility]   Status of Service:     If discussed at [place] of Stay Meetings, dates discussed:    Additional Comments:  Shelbie Ammons, [place] Management [phone] 10/19/2017, 11:01 AM

## 2017-10-19 NOTE — Progress Notes (Signed)
Spoke with nurse Kennyth Lose, RN and discussed reason for PIV. Pt has double lumen PICC which at this time supports the needed access for TNA and ATB. No further needs at this time. Fran Lowes, RN VAST

## 2017-10-20 LAB — GLUCOSE, CAPILLARY
GLUCOSE-CAPILLARY: 125 mg/dL — AB (ref 65–99)
GLUCOSE-CAPILLARY: 129 mg/dL — AB (ref 65–99)
GLUCOSE-CAPILLARY: 130 mg/dL — AB (ref 65–99)
Glucose-Capillary: 117 mg/dL — ABNORMAL HIGH (ref 65–99)
Glucose-Capillary: 126 mg/dL — ABNORMAL HIGH (ref 65–99)
Glucose-Capillary: 136 mg/dL — ABNORMAL HIGH (ref 65–99)
Glucose-Capillary: 141 mg/dL — ABNORMAL HIGH (ref 65–99)

## 2017-10-20 MED ORDER — KETOROLAC TROMETHAMINE 30 MG/ML IJ SOLN
30.0000 mg | Freq: Four times a day (QID) | INTRAMUSCULAR | Status: AC | PRN
  Administered 2017-10-20 – 2017-10-21 (×2): 30 mg via INTRAVENOUS
  Filled 2017-10-20 (×2): qty 1

## 2017-10-20 MED ORDER — FAT EMULSION 20 % IV EMUL
250.0000 mL | INTRAVENOUS | Status: AC
  Administered 2017-10-20: 17:00:00 250 mL via INTRAVENOUS
  Filled 2017-10-20: qty 250

## 2017-10-20 MED ORDER — TRACE MINERALS CR-CU-MN-SE-ZN 10-1000-500-60 MCG/ML IV SOLN
INTRAVENOUS | Status: AC
  Administered 2017-10-20: 17:00:00 via INTRAVENOUS
  Filled 2017-10-20: qty 1992

## 2017-10-20 NOTE — Progress Notes (Signed)
PHARMACY - ADULT TOTAL PARENTERAL NUTRITION CONSULT NOTE   Pharmacy Consult for TPN and intralipid Indication: malnourishment secondary to ileus   Patient Measurements: Height: 5\' 11"  (180.3 cm) Weight: 191 lb (86.6 kg) IBW/kg (Calculated) : 75.3 TPN AdjBW (KG): 89.4 Body mass index is 26.64 kg/m.   Assessment:   GI: ileus  Endo:  Insulin requirements in the past 24 hours: 10 Lytes: Renal: Pulm: Cards:  Hepatobil: Neuro: ID: Anti-infectives (From admission, onward)   Start     Dose/Rate Route Frequency Ordered Stop   10/14/17 1515  piperacillin-tazobactam (ZOSYN) IVPB 3.375 g     3.375 g 12.5 mL/hr over 240 Minutes Intravenous Every 8 hours 10/14/17 1507         TPN Access: 12/27  TPN start date: 12/27 Nutritional Goals (per RD recommendation on 12.27.18): KCal: 2233kcal Protein: 100gm  Fluid: 2232 ml fluid daily   Goal TPN rate is 83 ml/hr (provides 2233kcal, 100gm protein, 2232 ml fluid daily)      Current Nutrition:   Plan:  clinamix e 5/20 TPN at 83 mL/hr Multi vit, trace.  Continue lipids 20% @ 75ml/hr x 12 hr Electrolytes in TPN: clinamix e  novolog 0-15units SSI and adjust as needed No need for insulin in TPN bag currently Monitor electrolytes, glucose Will check electrolytes on wedensday, electrolytes have been stable. Can check q 2-3 days Weekly CBC, diff, CMP, TG and prealbumin  Ulice Dash, PharmD Clinical Pharmacist  10/20/2017,11:58 AM

## 2017-10-20 NOTE — Progress Notes (Signed)
[facility] Day(s): 8.   Post op day(s): 6 Days Post-Op.   Interval History: Patient seen and examined, no acute events or new complaints overnight. Patient continues to report abdominal "rumbling", but with burping and moderate peri-incisional pain for which he's been requesting morphine (though not as frequently), denies N/V, fever/chills, CP, or SOB. Patient walked once yesterday, not today and not frequently.  Review of Systems:  Constitutional: denies fever, chills  HEENT: denies cough or congestion  Respiratory: denies any shortness of breath  Cardiovascular: denies chest pain or palpitations  Gastrointestinal: abdominal pain, N/V, and bowel function as per interval history Genitourinary: denies burning with urination or urinary frequency Musculoskeletal: denies pain, decreased motor or sensation Integumentary: denies any other rashes or skin discolorations except post-surgical abdominal wound Neurological: denies HA or vision/hearing changes   Vital signs in last 24 hours: [min-max] current  Temp:  [97.9 F (36.6 C)-98.7 F (37.1 C)] 98.4 F (36.9 C) (01/01 0451) Pulse Rate:  [63-72] 70 (01/01 0451) Resp:  [18-20] 20 (01/01 0451) BP: (112-129)/(58-71) 112/58 (01/01 0451) SpO2:  [96 %-99 %] 96 % (01/01 0451) Weight:  [191 lb (86.6 kg)] 191 lb (86.6 kg) (01/01 0451)     Height: 5\' 11"  (180.3 cm) Weight: 191 lb (86.6 kg) BMI (Calculated): 26.65   Intake/Output this shift:  No intake/output data recorded.   Intake/Output last 2 shifts:  @IOLAST2SHIFTS @   Physical Exam:  Constitutional: alert, cooperative and no distress  HENT: normocephalic without obvious abnormality  Eyes: PERRL, EOM's grossly intact and symmetric  Neuro: CN II - XII grossly intact and symmetric without deficit  Respiratory: breathing non-labored at rest  Cardiovascular: regular rate and sinus rhythm  Gastrointestinal: soft, mild abdominal distention, mild peri-incisional  tenderness to palpation with incision well-approximated without any peri-incisional erythema or drainage Musculoskeletal: UE and LE FROM, no edema or wounds, motor and sensation grossly intact, NT   Labs:  CBC Latest Ref Rng & Units 10/19/2017 10/16/2017 10/15/2017  WBC 3.8 - 10.6 K/uL 4.8 6.8 8.5  Hemoglobin 13.0 - 18.0 g/dL 12.1(L) 12.1(L) 12.5(L)  Hematocrit 40.0 - 52.0 % 37.0(L) 37.5(L) 38.3(L)  Platelets 150 - 440 K/uL 241 211 209   CMP Latest Ref Rng & Units 10/19/2017 10/18/2017 10/17/2017  Glucose 65 - 99 mg/dL 152(H) - 152(H)  BUN 6 - 20 mg/dL 22(H) - 19  Creatinine 0.61 - 1.24 mg/dL 0.83 - 0.85  Sodium 135 - 145 mmol/L 137 - 137  Potassium 3.5 - 5.1 mmol/L 3.7 3.4(L) 3.6  Chloride 101 - 111 mmol/L 101 - 102  CO2 22 - 32 mmol/L 26 - 27  Calcium 8.9 - 10.3 mg/dL 8.4(L) - 8.2(L)  Total Protein 6.5 - 8.1 g/dL 6.3(L) - -  Total Bilirubin 0.3 - 1.2 mg/dL 0.4 - -  Alkaline Phos 38 - 126 U/L 49 - -  AST 15 - 41 U/L 43(H) - -  ALT 17 - 63 U/L 52 - -   Imaging studies: No new pertinent imaging studies   Assessment/Plan:(ICD-10's: K59.00,K66.8) 67 y.o.malewith likely slowly improving/resolvingpost-operative ileus and TPN for prolonged NPO6 Days Post-Ops/p unrevealing uneventful exploratory laparotomyfor pneumoperitoneum, complicated by pertinent comorbidities includingrecent immunotherapy for metastatic melanoma, mild chronic anemia, and osteoarthritis.  - pain control prn (minimize narcotics) - continue IV antibiotics and NPO with TPN, correct am electrolytes             - monitor ongoing abdominal exam and bowel function, anticipate flatus soon - medical management of comorbidities as  per primary medical team - DVT prophylaxis, ambulation encouraged  All of the above findings and recommendations were discussed with the patient, medical physician (Dr. Leslye Peer), andpatient's RN, and all of patient's and family's  questions were answered to their expressed satisfaction.  Thank you for the opportunity to participate in this patient's care.  -- Marilynne Drivers Rosana Hoes, MD, [place]: [facility] - Partnering for exceptional care. Office: [phone]

## 2017-10-20 NOTE — Progress Notes (Signed)
Patient ID: Louis Davies, male   DOB: 1951-02-28, 67 y.o.   MRN: [id number]    [facility] PROGRESS NOTE  Louis Davies PXT:[id number] DOB: December 26, 1950 DOA: 10/10/2017 PCP: [facility]  HPI/Subjective: Patient still not has passed gas.  Has some abdominal pain.  Patient feels things grumbling in his abdomen.  Objective: Vitals:   10/20/17 0900 10/20/17 1155  BP: 116/60 117/61  Pulse: 70 66  Resp: 20 20  Temp: 98.3 F (36.8 C) 97.6 F (36.4 C)  SpO2: 96% 97%    Filed Weights   10/18/17 0500 10/19/17 0355 10/20/17 0451  Weight: 88.4 kg (194 lb 12.8 oz) 87.5 kg (193 lb) 86.6 kg (191 lb)    ROS: Review of Systems  Constitutional: Negative for chills and fever.  Eyes: Negative for blurred vision.  Respiratory: Negative for cough and shortness of breath.   Cardiovascular: Negative for chest pain.  Gastrointestinal: Positive for abdominal pain. Negative for constipation, diarrhea, nausea and vomiting.  Genitourinary: Negative for dysuria.  Musculoskeletal: Negative for joint pain.  Neurological: Negative for dizziness and headaches.   Exam: Physical Exam  Constitutional: He is oriented to person, place, and time.  HENT:  Nose: No mucosal edema.  Mouth/Throat: No oropharyngeal exudate or posterior oropharyngeal edema.  Eyes: Conjunctivae, EOM and lids are normal. Pupils are equal, round, and reactive to light.  Neck: No JVD present. Carotid bruit is not present. No edema present. No thyroid mass and no thyromegaly present.  Cardiovascular: S1 normal and S2 normal. Exam reveals no gallop.  No murmur heard. Pulses:      Dorsalis pedis pulses are 2+ on the right side, and 2+ on the left side.  Respiratory: No respiratory distress. He has decreased breath sounds in the right lower field and the left lower field. He has no wheezes. He has no rhonchi. He has no rales.  GI: Soft. Bowel sounds are normal. He exhibits distension. There is generalized tenderness.   Musculoskeletal:       Right ankle: He exhibits no swelling.       Left ankle: He exhibits no swelling.  Lymphadenopathy:    He has no cervical adenopathy.  Neurological: He is alert and oriented to person, place, and time. No cranial nerve deficit.  Skin: Skin is warm. No rash noted. Nails show no clubbing.  Psychiatric: He has a normal mood and affect.      Data Reviewed: Basic Metabolic Panel: Recent Labs  Lab 10/14/17 0551 10/15/17 0622 10/16/17 0536 10/17/17 0721 10/18/17 0423 10/19/17 0326  NA 137 137 138 137  --  137  K 3.6 3.7 3.2* 3.6 3.4* 3.7  CL 104 103 102 102  --  101  CO2 27 26 31 27   --  26  GLUCOSE 108* 112* 137* 152*  --  152*  BUN 21* 18 19 19   --  22*  CREATININE 0.77 0.86 0.83 0.85  --  0.83  CALCIUM 8.6* 7.9* 8.3* 8.2*  --  8.4*  MG  --   --  1.9 2.0 2.0  --   PHOS  --   --  2.9 3.6 4.0  --    Liver Function Tests: Recent Labs  Lab 10/16/17 0536 10/19/17 0326  AST 16 43*  ALT 14* 52  ALKPHOS 51 49  BILITOT 0.6 0.4  PROT 5.8* 6.3*  ALBUMIN 2.7* 2.7*   CBC: Recent Labs  Lab 10/15/17 0622 10/16/17 0536 10/19/17 0326  WBC 8.5 6.8 4.8  NEUTROABS  --  5.1 3.3  HGB 12.5* 12.1* 12.1*  HCT 38.3* 37.5* 37.0*  MCV 77.8* 77.8* 77.9*  PLT 209 211 241     CBG: Recent Labs  Lab 10/19/17 2110 10/20/17 0013 10/20/17 0447 10/20/17 0721 10/20/17 1153  GLUCAP 109* 126* 141* 136* 129*      Scheduled Meds: . enoxaparin (LOVENOX) injection  40 mg Subcutaneous Q24H  . insulin aspart  0-15 Units Subcutaneous Q4H  . sodium chloride flush  10-40 mL Intracatheter Q12H  . sodium chloride flush  3 mL Intravenous Q12H   Continuous Infusions: . Marland KitchenTPN (CLINIMIX-E) Adult 83 mL/hr at 10/19/17 1728  . Marland KitchenTPN (CLINIMIX-E) Adult    . fat emulsion    . piperacillin-tazobactam (ZOSYN)  IV 3.375 g (10/20/17 1331)    Assessment/Plan:  1. Intestinal perforation with abdominal pain, and abdominal distention.  With exploratory surgery there was no  obvious source of the intestinal perforation.  Patient on TPN.  Patient on empiric antibiotics.  Watch clinically at this point.  Patient still with NG tube in.  Patient has not passed gas yet.  Case discussed with general surgery. 2. Hypokalemia.  Replace with TPN 3. Hypomagnesemia replaced 4. History of metastatic melanoma on immunotherapy as outpatient  Code Status:     Code Status Orders  (From admission, onward)        Start     Ordered   10/10/17 1809  Full code  Continuous     10/10/17 1808    Code Status History    Date Active Date Inactive Code Status Order ID Comments User Context   This patient has a current code status but no historical code status.    Advance Directive Documentation     Most Recent Value  Type of Advance Directive  Healthcare Power of Attorney  Pre-existing out of facility DNR order (yellow form or pink MOST form)  No data  "MOST" Form in Place?  No data      Disposition Plan: To be determined  Consultants:  General surgery  Oncology  Time spent: 20 minutes  [place]

## 2017-10-20 NOTE — Progress Notes (Signed)
Patient reports no BM or flatulence this shift.

## 2017-10-21 LAB — GLUCOSE, CAPILLARY
GLUCOSE-CAPILLARY: 139 mg/dL — AB (ref 65–99)
Glucose-Capillary: 138 mg/dL — ABNORMAL HIGH (ref 65–99)
Glucose-Capillary: 136 mg/dL — ABNORMAL HIGH (ref 65–99)
Glucose-Capillary: 140 mg/dL — ABNORMAL HIGH (ref 65–99)
Glucose-Capillary: 127 mg/dL — ABNORMAL HIGH (ref 65–99)

## 2017-10-21 LAB — MAGNESIUM: Magnesium: 2.3 mg/dL (ref 1.7–2.4)

## 2017-10-21 LAB — PHOSPHORUS: PHOSPHORUS: 5 mg/dL — AB (ref 2.5–4.6)

## 2017-10-21 LAB — POTASSIUM: POTASSIUM: 3.4 mmol/L — AB (ref 3.5–5.1)

## 2017-10-21 MED ORDER — FAT EMULSION 20 % IV EMUL
250.0000 mL | INTRAVENOUS | Status: AC
  Administered 2017-10-21: 18:00:00 250 mL via INTRAVENOUS
  Filled 2017-10-21: qty 250

## 2017-10-21 MED ORDER — POTASSIUM CHLORIDE 10 MEQ/100ML IV SOLN
10.0000 meq | INTRAVENOUS | Status: AC
Start: 2017-10-21 — End: 2017-10-21
  Administered 2017-10-21 (×2): 10 meq via INTRAVENOUS
  Filled 2017-10-21: qty 100

## 2017-10-21 MED ORDER — TRACE MINERALS CR-CU-MN-SE-ZN 10-1000-500-60 MCG/ML IV SOLN
INTRAVENOUS | Status: AC
  Administered 2017-10-21: 18:00:00 via INTRAVENOUS
  Filled 2017-10-21: qty 1992

## 2017-10-21 NOTE — Progress Notes (Signed)
PHARMACY - ADULT TOTAL PARENTERAL NUTRITION CONSULT NOTE   Pharmacy Consult for TPN and intralipid Indication: malnourishment secondary to ileus   Patient Measurements: Height: 5\' 11"  (180.3 cm) Weight: 190 lb (86.2 kg) IBW/kg (Calculated) : 75.3 TPN AdjBW (KG): 89.4 Body mass index is 26.5 kg/m.   Assessment: Phos: 5.0 K+: 3.3 Mg: 2.3  GI: ileus  Endo:  Insulin requirements in the past 24 hours: 10 Lytes: Renal:  Hepatobil: ID: Anti-infectives (From admission, onward)   Start     Dose/Rate Route Frequency Ordered Stop   10/14/17 1515  piperacillin-tazobactam (ZOSYN) IVPB 3.375 g     3.375 g 12.5 mL/hr over 240 Minutes Intravenous Every 8 hours 10/14/17 1507         TPN Access: 12/27  TPN start date: 12/27 Nutritional Goals (per RD recommendation on 12.27.18): KCal: 2233kcal Protein: 100gm  Fluid: 2232 ml fluid daily   Goal TPN rate is 83 ml/hr (provides 2233kcal, 100gm protein, 2232 ml fluid daily)  Current Nutrition:   Plan: Phos elevated today. Will order TPN w/o electrolytes this evening. Replace K+ with KCl 80mEq x 2 IV.    Start Clinamix 5/20 TPN at 83 mL/hr Multi vit, trace.  Continue lipids 20% @ 29ml/hr x 12 hr  novolog 0-15units SSI and adjust as needed No need for insulin in TPN bag currently Monitor electrolytes, glucose Will check electrolytes with AM labs. Weekly CBC, diff, CMP, TG and prealbumin  Pernell Dupre, PharmD, BCPS Clinical Pharmacist 10/21/2017 11:34 AM

## 2017-10-21 NOTE — Progress Notes (Signed)
Nutrition Follow-up  DOCUMENTATION CODES:   Not applicable  INTERVENTION:   Change to Clinimix 5/20 without electrolytes @ goal rate of 48ml/hr until pt is able to tolerate 100% of a full liquid diet   Continue 20% lipids @20ml /hr x 12 hrs/day   Regimen provides 2233kcal/day, 100g/day protein, 2223ml volume  Continue MVI daily   Continue trace elements  Discontinue thiamine   Daily weights  NUTRITION DIAGNOSIS:   Inadequate oral intake related to nausea, vomiting, other (see comment)(abdominal pain and distention) as evidenced by per patient/family report.  Ongoing - patient is NPO.  GOAL:   Patient will meet greater than or equal to 90% of their needs  -meeting with TPN.  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  Pt tolerating TPN well. Pt NPO; awaiting return of bowel function. NGT in place. Per chart, pt with 7lb weight loss since admit. RD will continue to monitor weights. Pt with elevated phosphorus today; will change to clinimix without electrolytes and supplement potassium. Recommend continue TPN until patient is able to tolerate 100% of a full liquid diet.    Medications reviewed and include: lovenox, insulin, zosyn, zofran  Labs reviewed: K 3.4(L), P 5.0(H), Mg 2.3 wnl Triglycerides- 140- 12/31 Prealbumin- 13.4(L)- 12/31 cbgs- 152 x 48 hrs  Diet Order:  Diet NPO time specified Except for: Ice Chips .TPN (CLINIMIX-E) Adult TPN (CLINIMIX) Adult without lytes  EDUCATION NEEDS:   No education needs have been identified at this time  Skin:  Closed incision to abdomen  Last BM:  12/29- type 5  Height:   Ht Readings from Last 1 Encounters:  10/10/17 5\' 11"  (1.803 m)    Weight:   Wt Readings from  Last 1 Encounters:  10/21/17 190 lb (86.2 kg)    Ideal Body Weight:  78.2 kg  BMI:  Body mass index is 26.5 kg/m.  Estimated Nutritional Needs:   Kcal:  2015-2350 (MSJ x 1.2-1.4)  Protein:  90-105 grams (1-1.2 grams/kg)  Fluid:  2.2-2.6 L/day (25-30 mL/kg)  Koleen Distance MS, RD, LDN Pager #- [phone] After Hours Pager: [phone]

## 2017-10-21 NOTE — Progress Notes (Signed)
PT Cancellation Note  Patient Details Name: Louis Davies MRN: [id number] DOB: Jan 11, 1951   Cancelled Treatment:    Reason Eval/Treat Not Completed: Patient declined, no reason specified.  Upon PT arrival, pt reporting just walking multiple times around nursing loop and needed to rest d/t fatigue (nursing also reporting pt ambulating today pushing IV pole).  Will re-attempt PT treatment at a later date/time.  Leitha Bleak, PT 10/21/17, 3:23 PM [phone]

## 2017-10-21 NOTE — Progress Notes (Signed)
Patient ID: Louis Davies, male   DOB: [birth year], 67 y.o.   MRN: [id number]    [facility] PROGRESS NOTE  Louis Davies [id number] DOB: 12-22-1950 DOA: 10/10/2017 PCP: [facility]  HPI/Subjective:  He was able to pass some gas abdominal pain is improved  Objective: Vitals:   10/21/17 0355 10/21/17 1329  BP: 107/62 119/72  Pulse: 61 71  Resp: 20 18  Temp: 97.8 F (36.6 C) 97.9 F (36.6 C)  SpO2: 95% 96%    Filed Weights   10/19/17 0355 10/20/17 0451 10/21/17 0355  Weight: 193 lb (87.5 kg) 191 lb (86.6 kg) 190 lb (86.2 kg)    ROS: Review of Systems  Constitutional: Negative for chills and fever.  Eyes: Negative for blurred vision.  Respiratory: Negative for cough and shortness of breath.   Cardiovascular: Negative for chest pain.  Gastrointestinal: Positive for abdominal pain. Negative for constipation, diarrhea, nausea and vomiting.  Genitourinary: Negative for dysuria.  Musculoskeletal: Negative for joint pain.  Neurological: Negative for dizziness and headaches.   Exam: Physical Exam  Constitutional: He is oriented to person, place, and time.  HENT:  Nose: No mucosal edema.  Mouth/Throat: No oropharyngeal exudate or posterior oropharyngeal edema.  Eyes: Conjunctivae, EOM and lids are normal. Pupils are equal, round, and reactive to light.  Neck: No JVD present. Carotid bruit is not present. No edema present. No thyroid mass and no thyromegaly present.  Cardiovascular: S1 normal and S2 normal. Exam reveals no gallop.  No murmur heard. Pulses:      Dorsalis pedis pulses are 2+ on the right side, and 2+ on the left side.  Respiratory: No respiratory distress. He has decreased breath sounds in the right lower field and the left lower field. He has no wheezes. He has no rhonchi. He has no rales.  GI: Soft. Bowel sounds are normal. He exhibits distension. There is generalized tenderness.  Musculoskeletal:       Right ankle: He exhibits no  swelling.       Left ankle: He exhibits no swelling.  Lymphadenopathy:    He has no cervical adenopathy.  Neurological: He is alert and oriented to person, place, and time. No cranial nerve deficit.  Skin: Skin is warm. No rash noted. Nails show no clubbing.  Psychiatric: He has a normal mood and affect.      Data Reviewed: Basic Metabolic Panel: Recent Labs  Lab 10/15/17 0622 10/16/17 0536 10/17/17 0721 10/18/17 0423 10/19/17 0326 10/21/17 0519  NA 137 138 137  --  137  --   K 3.7 3.2* 3.6 3.4* 3.7 3.4*  CL 103 102 102  --  101  --   CO2 26 31 27   --  26  --   GLUCOSE 112* 137* 152*  --  152*  --   BUN 18 19 19   --  22*  --   CREATININE 0.86 0.83 0.85  --  0.83  --   CALCIUM 7.9* 8.3* 8.2*  --  8.4*  --   MG  --  1.9 2.0 2.0  --  2.3  PHOS  --  2.9 3.6 4.0  --  5.0*   Liver Function Tests: Recent Labs  Lab 10/16/17 0536 10/19/17 0326  AST 16 43*  ALT 14* 52  ALKPHOS 51 49  BILITOT 0.6 0.4  PROT 5.8* 6.3*  ALBUMIN 2.7* 2.7*   CBC: Recent Labs  Lab 10/15/17 0622 10/16/17 0536 10/19/17 0326  WBC 8.5 6.8 4.8  NEUTROABS  --  5.1 3.3  HGB 12.5* 12.1* 12.1*  HCT 38.3* 37.5* 37.0*  MCV 77.8* 77.8* 77.9*  PLT 209 211 241     CBG: Recent Labs  Lab 10/20/17 2349 10/21/17 0353 10/21/17 0739 10/21/17 1139 10/21/17 1627  GLUCAP 125* 138* 136* 140* 139*      Scheduled Meds: . enoxaparin (LOVENOX) injection  40 mg Subcutaneous Q24H  . insulin aspart  0-15 Units Subcutaneous Q4H  . sodium chloride flush  10-40 mL Intracatheter Q12H  . sodium chloride flush  3 mL Intravenous Q12H   Continuous Infusions: . Marland KitchenTPN (CLINIMIX-E) Adult 83 mL/hr at 10/20/17 1714  . TPN (CLINIMIX) Adult without lytes     And  . fat emulsion    . piperacillin-tazobactam (ZOSYN)  IV 3.375 g (10/21/17 1632)    Assessment/Plan:  1. Intestinal perforation with abdominal pain, and abdominal distention.  With exploratory surgery there was no obvious source of the intestinal  perforation.  Patient on TPN.  Patient on empiric antibiotics.  Continue supportive care 2. Hypokalemia.  Replace with TPN 3. Hypomagnesemia replaced 4. History of metastatic melanoma on immunotherapy as outpatient  Code Status:     Code Status Orders  (From admission, onward)        Start     Ordered   10/10/17 1809  Full code  Continuous     10/10/17 1808    Code Status History    Date Active Date Inactive Code Status Order ID Comments User Context   This patient has a current code status but no historical code status.    Advance Directive Documentation     Most Recent Value  Type of Advance Directive  Healthcare Power of Attorney  Pre-existing out of facility DNR order (yellow form or pink MOST form)  No data  "MOST" Form in Place?  No data      Disposition Plan: To be determined  Consultants:  General surgery  Oncology  Time spent: 20 minutes  [name], [facility]

## 2017-10-21 NOTE — Progress Notes (Signed)
[facility] Day(s): 9.   Post op day(s): 7 Days Post-Op.   Interval History: Patient seen and examined, no acute events or new complaints overnight. Patient continues to report abdominal "rumbling" and now describes rectal pressure as if he's "about to pass gas/BM. He also says his pain continues to decrease and he passed a small formed BM this morning, but no flatus yet. He otherwise now denies N/V, any further belching, fever/chills, CP, or SOB. Patient says he has ambulated in the halls and plans to do so more today.  Review of Systems:  Constitutional: denies fever, chills  HEENT: denies cough or congestion  Respiratory: denies any shortness of breath  Cardiovascular: denies chest pain or palpitations  Gastrointestinal: abdominal pain, N/V, and bowel function as per interval history Genitourinary: denies burning with urination or urinary frequency Musculoskeletal: denies pain, decreased motor or sensation Integumentary: denies any other rashes or skin discolorations except post-surgical abdominal wounds Neurological: denies HA or vision/hearing changes   Vital signs in last 24 hours: [min-max] current  Temp:  [97.6 F (36.4 C)-98.3 F (36.8 C)] 97.8 F (36.6 C) (01/02 0355) Pulse Rate:  [61-70] 61 (01/02 0355) Resp:  [20] 20 (01/02 0355) BP: (105-117)/(53-62) 107/62 (01/02 0355) SpO2:  [95 %-97 %] 95 % (01/02 0355) Weight:  [190 lb (86.2 kg)] 190 lb (86.2 kg) (01/02 0355)     Height: 5\' 11"  (180.3 cm) Weight: 190 lb (86.2 kg) BMI (Calculated): 26.51   Intake/Output this shift:  No intake/output data recorded.   Intake/Output last 2 shifts:  @IOLAST2SHIFTS @   Physical Exam:  Constitutional: alert, cooperative and no distress  HENT: normocephalic without obvious abnormality  Eyes: PERRL, EOM's grossly intact and symmetric  Neuro: CN II - XII grossly intact and symmetric without deficit  Respiratory: breathing non-labored at rest  Cardiovascular:  regular rate and sinus rhythm  Gastrointestinal: soft and non-distended with mild peri-incisional tenderness to palpation, incision well-approximated without any surrounding erythema or drainage Musculoskeletal: UE and LE FROM, no edema or wounds, motor and sensation grossly intact, NT   Labs:  CBC Latest Ref Rng & Units 10/19/2017 10/16/2017 10/15/2017  WBC 3.8 - 10.6 K/uL 4.8 6.8 8.5  Hemoglobin 13.0 - 18.0 g/dL 12.1(L) 12.1(L) 12.5(L)  Hematocrit 40.0 - 52.0 % 37.0(L) 37.5(L) 38.3(L)  Platelets 150 - 440 K/uL 241 211 209   CMP Latest Ref Rng & Units 10/21/2017 10/19/2017 10/18/2017  Glucose 65 - 99 mg/dL - 152(H) -  BUN 6 - 20 mg/dL - 22(H) -  Creatinine 0.61 - 1.24 mg/dL - 0.83 -  Sodium 135 - 145 mmol/L - 137 -  Potassium 3.5 - 5.1 mmol/L 3.4(L) 3.7 3.4(L)  Chloride 101 - 111 mmol/L - 101 -  CO2 22 - 32 mmol/L - 26 -  Calcium 8.9 - 10.3 mg/dL - 8.4(L) -  Total Protein 6.5 - 8.1 g/dL - 6.3(L) -  Total Bilirubin 0.3 - 1.2 mg/dL - 0.4 -  Alkaline Phos 38 - 126 U/L - 49 -  AST 15 - 41 U/L - 43(H) -  ALT 17 - 63 U/L - 52 -   Imaging studies: No new pertinent imaging studies   Assessment/Plan:(ICD-10's: K59.00,K66.8) 67 y.o.malewith likely slowly improving/resolvingpost-operative ileus and TPN for prolonged NPO7Days Post-Ops/p unrevealing uneventful exploratory laparotomyfor pneumoperitoneum, complicated by pertinent comorbidities includingrecent immunotherapy for metastatic melanoma, mild chronic anemia, and osteoarthritis.  - pain control prn (minimize narcotics) - continue IV antibiotics and NPO with TPN, correct am electrolytes - monitor ongoing  abdominal exam and bowel function, anticipate flatus soon - medical management of comorbidities as per primary medical team - DVT prophylaxis, ambulation encouraged  All of the above findings and recommendations were discussed with the patient, medical physician  (Dr. Leslye Peer), andpatient's RN, and all of patient's and family's questions were answered to their expressed satisfaction.  Thank you for the opportunity to participate in this patient's care.  -- Marilynne Drivers Rosana Hoes, MD, [place]: [facility] - Partnering for exceptional care. Office: [phone]

## 2017-10-22 ENCOUNTER — Inpatient Hospital Stay: Payer: Medicare Other

## 2017-10-22 ENCOUNTER — Inpatient Hospital Stay: Payer: Medicare Other | Admitting: Oncology

## 2017-10-22 LAB — GLUCOSE, CAPILLARY
GLUCOSE-CAPILLARY: 107 mg/dL — AB (ref 65–99)
GLUCOSE-CAPILLARY: 131 mg/dL — AB (ref 65–99)
GLUCOSE-CAPILLARY: 139 mg/dL — AB (ref 65–99)
GLUCOSE-CAPILLARY: 140 mg/dL — AB (ref 65–99)
GLUCOSE-CAPILLARY: 142 mg/dL — AB (ref 65–99)
Glucose-Capillary: 151 mg/dL — ABNORMAL HIGH (ref 65–99)

## 2017-10-22 LAB — COMPREHENSIVE METABOLIC PANEL
ALT: 36 U/L (ref 17–63)
ANION GAP: 8 (ref 5–15)
AST: 27 U/L (ref 15–41)
Albumin: 2.8 g/dL — ABNORMAL LOW (ref 3.5–5.0)
Alkaline Phosphatase: 58 U/L (ref 38–126)
BUN: 33 mg/dL — ABNORMAL HIGH (ref 6–20)
CHLORIDE: 98 mmol/L — AB (ref 101–111)
CO2: 33 mmol/L — ABNORMAL HIGH (ref 22–32)
Calcium: 8.3 mg/dL — ABNORMAL LOW (ref 8.9–10.3)
Creatinine, Ser: 1.17 mg/dL (ref 0.61–1.24)
Glucose, Bld: 145 mg/dL — ABNORMAL HIGH (ref 65–99)
POTASSIUM: 3.3 mmol/L — AB (ref 3.5–5.1)
Sodium: 139 mmol/L (ref 135–145)
Total Bilirubin: 0.3 mg/dL (ref 0.3–1.2)
Total Protein: 6.4 g/dL — ABNORMAL LOW (ref 6.5–8.1)

## 2017-10-22 LAB — PHOSPHORUS: PHOSPHORUS: 3.9 mg/dL (ref 2.5–4.6)

## 2017-10-22 MED ORDER — PROMETHAZINE HCL 25 MG/ML IJ SOLN
12.5000 mg | Freq: Four times a day (QID) | INTRAMUSCULAR | Status: DC | PRN
  Administered 2017-10-22 – 2017-10-24 (×4): 12.5 mg via INTRAVENOUS
  Filled 2017-10-22 (×4): qty 1

## 2017-10-22 MED ORDER — POTASSIUM CHLORIDE 10 MEQ/100ML IV SOLN
10.0000 meq | INTRAVENOUS | Status: AC
  Administered 2017-10-22 (×4): 10 meq via INTRAVENOUS
  Filled 2017-10-22 (×3): qty 100

## 2017-10-22 MED ORDER — FAT EMULSION 20 % IV EMUL
250.0000 mL | INTRAVENOUS | Status: AC
  Administered 2017-10-22: 250 mL via INTRAVENOUS
  Filled 2017-10-22: qty 250

## 2017-10-22 MED ORDER — TRACE MINERALS CR-CU-MN-SE-ZN 10-1000-500-60 MCG/ML IV SOLN
INTRAVENOUS | Status: AC
  Administered 2017-10-22: 18:00:00 via INTRAVENOUS
  Filled 2017-10-22: qty 1992

## 2017-10-22 NOTE — Progress Notes (Signed)
Patient ID: WILMER SANTILLO, male   DOB: 08/19/1951, 67 y.o.   MRN: [id number]    [facility] PROGRESS NOTE  JOHNATHA ZEIDMAN URK:[id number] DOB: 23-Jul-1951 DOA: 10/10/2017 PCP: [facility]  HPI/Subjective:  Patient did have a small formed bowel movement  Objective: Vitals:   10/22/17 0445 10/22/17 1335  BP: 116/68 109/69  Pulse: 73 73  Resp: 18   Temp: 98.5 F (36.9 C) 97.9 F (36.6 C)  SpO2: 95% 96%    Filed Weights   10/20/17 0451 10/21/17 0355 10/22/17 0500  Weight: 191 lb (86.6 kg) 190 lb (86.2 kg) 189 lb 12.8 oz (86.1 kg)    ROS: Review of Systems  Constitutional: Negative for chills and fever.  Eyes: Negative for blurred vision.  Respiratory: Negative for cough and shortness of breath.   Cardiovascular: Negative for chest pain.  Gastrointestinal: Positive for abdominal pain. Negative for constipation, diarrhea, nausea and vomiting.  Genitourinary: Negative for dysuria.  Musculoskeletal: Negative for joint pain.  Neurological: Negative for dizziness and headaches.   Exam: Physical Exam  Constitutional: He is oriented to person, place, and time.  HENT:  Nose: No mucosal edema.  Mouth/Throat: No oropharyngeal exudate or posterior oropharyngeal edema.  Eyes: Conjunctivae, EOM and lids are normal. Pupils are equal, round, and reactive to light.  Neck: No JVD present. Carotid bruit is not present. No edema present. No thyroid mass and no thyromegaly present.  Cardiovascular: S1 normal and S2 normal. Exam reveals no gallop.  No murmur heard. Pulses:      Dorsalis pedis pulses are 2+ on the right side, and 2+ on the left side.  Respiratory: No respiratory distress. He has decreased breath sounds in the right lower field and the left lower field. He has no wheezes. He has no rhonchi. He has no rales.  GI: Soft. Bowel sounds are normal. He exhibits distension. There is generalized tenderness.  Musculoskeletal:       Right ankle: He exhibits no swelling.        Left ankle: He exhibits no swelling.  Lymphadenopathy:    He has no cervical adenopathy.  Neurological: He is alert and oriented to person, place, and time. No cranial nerve deficit.  Skin: Skin is warm. No rash noted. Nails show no clubbing.  Psychiatric: He has a normal mood and affect.      Data Reviewed: Basic Metabolic Panel: Recent Labs  Lab 10/16/17 0536 10/17/17 0721 10/18/17 0423 10/19/17 0326 10/21/17 0519 10/22/17 0234  NA 138 137  --  137  --  139  K 3.2* 3.6 3.4* 3.7 3.4* 3.3*  CL 102 102  --  101  --  98*  CO2 31 27  --  26  --  33*  GLUCOSE 137* 152*  --  152*  --  145*  BUN 19 19  --  22*  --  33*  CREATININE 0.83 0.85  --  0.83  --  1.17  CALCIUM 8.3* 8.2*  --  8.4*  --  8.3*  MG 1.9 2.0 2.0  --  2.3  --   PHOS 2.9 3.6 4.0  --  5.0* 3.9   Liver Function Tests: Recent Labs  Lab 10/16/17 0536 10/19/17 0326 10/22/17 0234  AST 16 43* 27  ALT 14* 52 36  ALKPHOS 51 49 58  BILITOT 0.6 0.4 0.3  PROT 5.8* 6.3* 6.4*  ALBUMIN 2.7* 2.7* 2.8*   CBC: Recent Labs  Lab 10/16/17 0536 10/19/17 0326  WBC 6.8  4.8  NEUTROABS 5.1 3.3  HGB 12.1* 12.1*  HCT 37.5* 37.0*  MCV 77.8* 77.9*  PLT 211 241     CBG: Recent Labs  Lab 10/21/17 2029 10/22/17 0026 10/22/17 0449 10/22/17 0759 10/22/17 1157  GLUCAP 127* 140* 151* 139* 131*      Scheduled Meds: . enoxaparin (LOVENOX) injection  40 mg Subcutaneous Q24H  . insulin aspart  0-15 Units Subcutaneous Q4H  . sodium chloride flush  10-40 mL Intracatheter Q12H  . sodium chloride flush  3 mL Intravenous Q12H   Continuous Infusions: . Marland KitchenTPN (CLINIMIX-E) Adult     And  . fat emulsion    . piperacillin-tazobactam (ZOSYN)  IV Stopped (10/22/17 1657)  . TPN (CLINIMIX) Adult without lytes 83 mL/hr at 10/21/17 1736    Assessment/Plan:  1. Intestinal perforation with abdominal pain, and abdominal distention.  With exploratory surgery there was no obvious source of the intestinal perforation.   Patient on TPN.  Patient on empiric antibiotics.  Continue supportive care, slow to improve 2. Hypokalemia.  Replace with TPN 3. Hypomagnesemia replaced 4. History of metastatic melanoma on immunotherapy as outpatient  Code Status:     Code Status Orders  (From admission, onward)        Start     Ordered   10/10/17 1809  Full code  Continuous     10/10/17 1808    Code Status History    Date Active Date Inactive Code Status Order ID Comments User Context   This patient has a current code status but no historical code status.    Advance Directive Documentation     Most Recent Value  Type of Advance Directive  Healthcare Power of Attorney  Pre-existing out of facility DNR order (yellow form or pink MOST form)  No data  "MOST" Form in Place?  No data      Disposition Plan: To be determined  Consultants:  General surgery  Oncology  Time spent: 20 minutes  [place], [facility]

## 2017-10-22 NOTE — Progress Notes (Signed)
Physical Therapy Treatment Patient Details Name: Louis Davies MRN: [id number] DOB: 09-07-1951 Today's Date: 10/22/2017    History of Present Illness Pt is a 67 y.o. male presenting to hospital with constipation and emesis.  Pt admitted with ileus.  Pt s/p exploratory laparatomy 10/14/17.  Testing demonstrating pneumoperitoneum and pneumatosis.  PMH includes metastatic melanoma and anemia.    PT Comments    Pt ambulated 5 laps around nursing station today (initially holding onto IV pole for balance but only used for 1 lap).  No c/o pain during session.  Able to perform some standing ex's (with UE support) but limited d/t pt fatigue.  Will continue to focus on strengthening, higher level balance, and independence with functional mobility during hospitalization.    Follow Up Recommendations  Outpatient PT     Equipment Recommendations  None recommended by PT    Recommendations for Other Services       Precautions / Restrictions Precautions Precautions: Fall Precaution Comments: NG tube; NPO; aspiration Restrictions Weight Bearing Restrictions: No    Mobility  Bed Mobility Overal bed mobility: Modified Independent Bed Mobility: Supine to Sit     Supine to sit: Modified independent (Device/Increase time)     General bed mobility comments: no difficulties noted supine to sit  Transfers Overall transfer level: Needs assistance Equipment used: None Transfers: Sit to/from Stand Sit to Stand: Independent Stand pivot transfers: Supervision       General transfer comment: steady strong sit to/from stand; SBA for transfers  Ambulation/Gait Ambulation/Gait assistance: Supervision;Min guard Ambulation Distance (Feet): 825 Feet Assistive device: (IV pole or none)   Gait velocity: mildly decreased   General Gait Details: decreased B step length and foot clearance initially (pt holding onto IV pole for balance); after one lap pt transitioned to ambulating no AD; improved B  UE arm swing and step length noted   Stairs            Wheelchair Mobility    Modified Rankin (Stroke Patients Only)       Balance Overall balance assessment: Needs assistance Sitting-balance support: No upper extremity supported;Feet supported Sitting balance-Leahy Scale: Normal Sitting balance - Comments: steady sitting reaching outside BOS   Standing balance support: No upper extremity supported;During functional activity Standing balance-Leahy Scale: Good Standing balance comment: good standing balance reaching within BOS; holds onto objects when reaching outside BOS for balance                            Cognition Arousal/Alertness: Awake/alert Behavior During Therapy: WFL for tasks assessed/performed Overall Cognitive Status: Within Functional Limits for tasks assessed                                        Exercises General Exercises - Lower Extremity Hip ABduction/ADduction: AROM;Strengthening;Both;10 reps;Standing Hip Flexion/Marching: AROM;Strengthening;Both;10 reps;Standing    General Comments General comments (skin integrity, edema, etc.): Pt resting in bed with NG tube in place upon PT arrival.  Nursing cleared pt for participation in physical therapy and disconnected NG tube for PT session..  Pt agreeable to PT session.      Pertinent Vitals/Pain Pain Assessment: No/denies pain Pain Intervention(s): Limited activity within patient's tolerance;Monitored during session  Vitals (HR and O2 on room air) stable and WFL throughout treatment session.    Home Living  Prior Function            PT Goals (current goals can now be found in the care plan section) Acute Rehab PT Goals Patient Stated Goal: to get stronger and go home PT Goal Formulation: With patient Time For Goal Achievement: 10/31/17 Potential to Achieve Goals: Good Progress towards PT goals: Progressing toward goals     Frequency    Min 2X/week      PT Plan Current plan remains appropriate    Co-evaluation              AM-PAC PT "6 Clicks" Daily Activity  Outcome Measure  Difficulty turning over in bed (including adjusting bedclothes, sheets and blankets)?: None Difficulty moving from lying on back to sitting on the side of the bed? : None Difficulty sitting down on and standing up from a chair with arms (e.g., wheelchair, bedside commode, etc,.)?: None Help needed moving to and from a bed to chair (including a wheelchair)?: A Little Help needed walking in hospital room?: A Little Help needed climbing 3-5 steps with a railing? : A Little 6 Click Score: 21    End of Session Equipment Utilized During Treatment: Gait belt Activity Tolerance: Patient tolerated treatment well Patient left: in chair;with call bell/phone within reach;with family/visitor present Nurse Communication: Mobility status;Precautions;Other (comment)(pt sitting in chair (no chair alarm d/t low fall risk score); NG tube needing to be reconnected) PT Visit Diagnosis: Other abnormalities of gait and mobility (R26.89);Muscle weakness (generalized) (M62.81)     Time: 4270-6237 PT Time Calculation (min) (ACUTE ONLY): 23 min  Charges:  $Gait Training: 8-22 mins $Therapeutic Exercise: 8-22 mins                    G CodesLeitha Bleak, PT 10/22/17, 4:01 PM [phone]

## 2017-10-22 NOTE — Progress Notes (Signed)
Nutrition Follow-up  DOCUMENTATION CODES:   Not applicable  INTERVENTION:  Switch to Clinimix 5/20 with electrolytes at goal rate 24mL/hr  Continue Lipids at 20% at 13mL/hr x 12 hrs/day  Provides 2233 calories/day, 100gm protein, 2289mL free water  Continue MVI daily  Continue trace elements  Continues to remain NPO  Daily Weights  NUTRITION DIAGNOSIS:   Inadequate oral intake related to nausea, vomiting, other (see comment)(abdominal pain and distention) as evidenced by per patient/family report. 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  Continues to tolerate TPN Awaiting return of bowel function. Had a BM today, no flatus  Labs reviewed:  CBGs 131, 139, 151 K+ 3.3, BUN 33  Medications reviewed and include:  Insulin, K+ 64mEq x4   Intake/Output Summary (Last 24 hours) at 10/22/2017 1300 Last data filed at 10/22/2017 0500 Gross per 24 hour  Intake 1026.56 ml  Output 3025 ml  Net -1998.44 ml   Diet Order:  Diet NPO time specified Except for: Ice Chips TPN (CLINIMIX) Adult without lytes TPN (CLINIMIX-E) Adult  EDUCATION NEEDS:   No education needs have been identified at this time  Skin:  Skin Assessment: Skin Integrity Issues: Skin Integrity Issues:: Incisions Incisions: closed incision to abdomen  Last BM:  10/21/2017 (type 5)  Height:   Ht Readings from Last 1 Encounters:  10/10/17 5\' 11"  (1.803 m)    Weight:   Wt Readings from Last 1 Encounters:  10/22/17 189 lb 12.8 oz (86.1 kg)    Ideal Body Weight:  78.2 kg  BMI:  Body  mass index is 26.47 kg/m.  Estimated Nutritional Needs:   Kcal:  2015-2350 (MSJ x 1.2-1.4)  Protein:  90-105 grams (1-1.2 grams/kg)  Fluid:  2.2-2.6 L/day (25-30 mL/kg)  Louis Davies. Louis Briddell, MS, RD LDN Inpatient Clinical Dietitian Pager [phone]

## 2017-10-22 NOTE — Progress Notes (Signed)
PHARMACY - ADULT TOTAL PARENTERAL NUTRITION CONSULT NOTE   Pharmacy Consult for TPN and intralipid Indication: malnourishment secondary to ileus   Patient Measurements: Height: 5\' 11"  (180.3 cm) Weight: 189 lb 12.8 oz (86.1 kg) IBW/kg (Calculated) : 75.3 TPN AdjBW (KG): 89.4 Body mass index is 26.47 kg/m.   Assessment: Phos: 3.9 K+: 3.3 Mg: 2.3  GI: ileus  Endo:  Insulin requirements in the past 24 hours: 11units Lytes: Renal:  Hepatobil: ID: Anti-infectives (From admission, onward)   Start     Dose/Rate Route Frequency Ordered Stop   10/14/17 1515  piperacillin-tazobactam (ZOSYN) IVPB 3.375 g     3.375 g 12.5 mL/hr over 240 Minutes Intravenous Every 8 hours 10/14/17 1507         TPN Access: 12/27  TPN start date: 12/27 Nutritional Goals (per RD recommendation on 12.27.18): KCal: 2233kcal Protein: 100gm  Fluid: 2232 ml fluid daily   Goal TPN rate is 83 ml/hr (provides 2233kcal, 100gm protein, 2232 ml fluid daily)  Current Nutrition:   Plan: Phos WNL today. Replacing K+ with KCl 54mEq x 4 IV.    Switch back to Clinimix TPN 5/20 with electrolytes at 83 mL/hr with Multi vit and trace.  Continue lipids 20% @ 61ml/hr x 12 hr  Novolog 0-15units SSI and adjust as needed No need for insulin in TPN bag currently Monitor electrolytes, glucose Will check electrolytes with AM labs.  Weekly CBC, diff, CMP, TG and prealbumin  Pernell Dupre, PharmD, BCPS Clinical Pharmacist 10/22/2017 11:27 AM

## 2017-10-22 NOTE — Progress Notes (Signed)
[facility] Hospital Day(s): 10.   Post op day(s): 8 Days Post-Op.   Interval History: Patient seen and examined, no acute events or new complaints overnight. Patient continues to report intermittent abdominal "rumbling" and passed BM x 1, but continues to deny flatus. He says he has been minimizing taking narcotics and that his pain continues to decrease. He otherwise deniesany N/V, any further belching, fever/chills, CP, or SOB. Patient also says he ambulated in the halls yesterday and plans to do so more today.  Review of Systems:  Constitutional: denies fever, chills  HEENT: denies cough or congestion  Respiratory: denies any shortness of breath  Cardiovascular: denies chest pain or palpitations  Gastrointestinal: abdominal pain, N/V, and bowel function as per interval history Genitourinary: denies burning with urination or urinary frequency Musculoskeletal: denies pain, decreased motor or sensation Integumentary: denies any other rashes or skin discolorations except post-surgical abdominal wound Neurological: denies HA or vision/hearing changes   Vital signs in last 24 hours: [min-max] current  Temp:  [97.8 F (36.6 C)-98.5 F (36.9 C)] 98.5 F (36.9 C) (01/03 0445) Pulse Rate:  [71-85] 73 (01/03 0445) Resp:  [18] 18 (01/03 0445) BP: (116-119)/(58-72) 116/68 (01/03 0445) SpO2:  [95 %-96 %] 95 % (01/03 0445) Weight:  [189 lb 12.8 oz (86.1 kg)] 189 lb 12.8 oz (86.1 kg) (01/03 0500)     Height: 5\' 11"  (180.3 cm) Weight: 189 lb 12.8 oz (86.1 kg) BMI (Calculated): 26.48   Intake/Output this shift:  No intake/output data recorded.   Intake/Output last 2 shifts:  @IOLAST2SHIFTS @   Physical Exam:  Constitutional: alert, cooperative and no distress  HENT: normocephalic without obvious abnormality  Eyes: PERRL, EOM's grossly intact and symmetric  Neuro: CN II - XII grossly intact and symmetric without deficit  Respiratory: breathing non-labored at rest   Cardiovascular: regular rate and sinus rhythm  Gastrointestinal: soft, non-tender, and non-distended with incision well-approximated without erythema or drainage Musculoskeletal: UE and LE FROM, no edema or wounds, motor and sensation grossly intact, NT   Labs:  CBC Latest Ref Rng & Units 10/19/2017 10/16/2017 10/15/2017  WBC 3.8 - 10.6 K/uL 4.8 6.8 8.5  Hemoglobin 13.0 - 18.0 g/dL 12.1(L) 12.1(L) 12.5(L)  Hematocrit 40.0 - 52.0 % 37.0(L) 37.5(L) 38.3(L)  Platelets 150 - 440 K/uL 241 211 209   CMP Latest Ref Rng & Units 10/22/2017 10/21/2017 10/19/2017  Glucose 65 - 99 mg/dL 145(H) - 152(H)  BUN 6 - 20 mg/dL 33(H) - 22(H)  Creatinine 0.61 - 1.24 mg/dL 1.17 - 0.83  Sodium 135 - 145 mmol/L 139 - 137  Potassium 3.5 - 5.1 mmol/L 3.3(L) 3.4(L) 3.7  Chloride 101 - 111 mmol/L 98(L) - 101  CO2 22 - 32 mmol/L 33(H) - 26  Calcium 8.9 - 10.3 mg/dL 8.3(L) - 8.4(L)  Total Protein 6.5 - 8.1 g/dL 6.4(L) - 6.3(L)  Total Bilirubin 0.3 - 1.2 mg/dL 0.3 - 0.4  Alkaline Phos 38 - 126 U/L 58 - 49  AST 15 - 41 U/L 27 - 43(H)  ALT 17 - 63 U/L 36 - 52   Imaging studies: No new pertinent imaging studies  Assessment/Plan:(ICD-10's: K59.00,K66.8) 67 y.o.malewith slowly improving/resolvingpost-operative ileus and TPN for prolonged NPO8Days Post-Ops/p unrevealing uneventful exploratory laparotomyfor pneumoperitoneum with extensive examination and manipulation of bowel, complicated by pertinent comorbidities includingrecent immunotherapy for metastatic melanoma, mild chronic anemia, and osteoarthritis.  - pain control prn (minimize narcotics) - continue IV antibiotics and NPO with TPN, correct am electrolytes - monitor ongoing abdominal exam  and bowel function, anticipate flatus soon - medical management of comorbidities as per primary medical team - DVT prophylaxis, ambulation encouraged  All of the above findings and recommendations were  discussed with the patient, and all of patient's questions were answered to his expressed satisfaction.  Thank you for the opportunity to participate in this patient's care.  -- Marilynne Drivers Rosana Hoes, MD, [place]: [facility] - Partnering for exceptional care. Office: [phone]

## 2017-10-23 LAB — GLUCOSE, CAPILLARY
GLUCOSE-CAPILLARY: 151 mg/dL — AB (ref 65–99)
GLUCOSE-CAPILLARY: 158 mg/dL — AB (ref 65–99)
Glucose-Capillary: 162 mg/dL — ABNORMAL HIGH (ref 65–99)
Glucose-Capillary: 157 mg/dL — ABNORMAL HIGH (ref 65–99)
Glucose-Capillary: 144 mg/dL — ABNORMAL HIGH (ref 65–99)
Glucose-Capillary: 131 mg/dL — ABNORMAL HIGH (ref 65–99)

## 2017-10-23 LAB — PHOSPHORUS: PHOSPHORUS: 3.8 mg/dL (ref 2.5–4.6)

## 2017-10-23 LAB — POTASSIUM: Potassium: 3 mmol/L — ABNORMAL LOW (ref 3.5–5.1)

## 2017-10-23 MED ORDER — M.V.I. ADULT IV INJ
INJECTION | INTRAVENOUS | Status: AC
  Administered 2017-10-23: 17:00:00 via INTRAVENOUS
  Filled 2017-10-23: qty 1992

## 2017-10-23 MED ORDER — POTASSIUM CHLORIDE 10 MEQ/100ML IV SOLN
10.0000 meq | INTRAVENOUS | Status: AC
  Administered 2017-10-23 (×4): 10 meq via INTRAVENOUS
  Filled 2017-10-23 (×4): qty 100

## 2017-10-23 MED ORDER — FAT EMULSION 20 % IV EMUL
250.0000 mL | INTRAVENOUS | Status: AC
  Administered 2017-10-23: 17:00:00 250 mL via INTRAVENOUS
  Filled 2017-10-23: qty 250

## 2017-10-23 MED ORDER — PIPERACILLIN-TAZOBACTAM 3.375 G IVPB
3.3750 g | Freq: Three times a day (TID) | INTRAVENOUS | Status: DC
  Administered 2017-10-24 – 2017-10-26 (×8): 3.375 g via INTRAVENOUS
  Filled 2017-10-23 (×8): qty 50

## 2017-10-23 NOTE — Progress Notes (Signed)
Patient ID: Louis Davies, male   DOB: [birth year], 67 y.o.   MRN: [id number]    [facility] PROGRESS NOTE  Louis Davies:[id number] DOB: [birth year] DOA: 10/10/2017 PCP: [facility]  HPI/Subjective:  Patient feeling better had 3 bowel movements passing more gas  Objective: Vitals:   10/23/17 0432 10/23/17 1332  BP: 115/70 108/74  Pulse: 78 90  Resp: 18   Temp: 97.8 F (36.6 C) (!) 97.5 F (36.4 C)  SpO2: 94% 97%    Filed Weights   10/21/17 0355 10/22/17 0500 10/23/17 0441  Weight: 190 lb (86.2 kg) 189 lb 12.8 oz (86.1 kg) 188 lb 4.8 oz (85.4 kg)    ROS: Review of Systems  Constitutional: Negative for chills and fever.  Eyes: Negative for blurred vision.  Respiratory: Negative for cough and shortness of breath.   Cardiovascular: Negative for chest pain.  Gastrointestinal: Positive for abdominal pain. Negative for constipation, diarrhea, nausea and vomiting.  Genitourinary: Negative for dysuria.  Musculoskeletal: Negative for joint pain.  Neurological: Negative for dizziness and headaches.   Exam: Physical Exam  Constitutional: He is oriented to person, place, and time.  HENT:  Nose: No mucosal edema.  Mouth/Throat: No oropharyngeal exudate or posterior oropharyngeal edema.  Eyes: Conjunctivae, EOM and lids are normal. Pupils are equal, round, and reactive to light.  Neck: No JVD present. Carotid bruit is not present. No edema present. No thyroid mass and no thyromegaly present.  Cardiovascular: S1 normal and S2 normal. Exam reveals no gallop.  No murmur heard. Pulses:      Dorsalis pedis pulses are 2+ on the right side, and 2+ on the left side.  Respiratory: No respiratory distress. He has decreased breath sounds in the right lower field and the left lower field. He has no wheezes. He has no rhonchi. He has no rales.  GI: Soft. Bowel sounds are normal. He exhibits distension. There is generalized tenderness.  Musculoskeletal:       Right  ankle: He exhibits no swelling.       Left ankle: He exhibits no swelling.  Lymphadenopathy:    He has no cervical adenopathy.  Neurological: He is alert and oriented to person, place, and time. No cranial nerve deficit.  Skin: Skin is warm. No rash noted. Nails show no clubbing.  Psychiatric: He has a normal mood and affect.      Data Reviewed: Basic Metabolic Panel: Recent Labs  Lab 10/17/17 0721 10/18/17 0423 10/19/17 0326 10/21/17 0519 10/22/17 0234 10/23/17 0603  NA 137  --  137  --  139  --   K 3.6 3.4* 3.7 3.4* 3.3* 3.0*  CL 102  --  101  --  98*  --   CO2 27  --  26  --  33*  --   GLUCOSE 152*  --  152*  --  145*  --   BUN 19  --  22*  --  33*  --   CREATININE 0.85  --  0.83  --  1.17  --   CALCIUM 8.2*  --  8.4*  --  8.3*  --   MG 2.0 2.0  --  2.3  --   --   PHOS 3.6 4.0  --  5.0* 3.9 3.8   Liver Function Tests: Recent Labs  Lab 10/19/17 0326 10/22/17 0234  AST 43* 27  ALT 52 36  ALKPHOS 49 58  BILITOT 0.4 0.3  PROT 6.3* 6.4*  ALBUMIN 2.7* 2.8*  CBC: Recent Labs  Lab 10/19/17 0326  WBC 4.8  NEUTROABS 3.3  HGB 12.1*  HCT 37.0*  MCV 77.9*  PLT 241     CBG: Recent Labs  Lab 10/22/17 2027 10/23/17 0003 10/23/17 0435 10/23/17 0733 10/23/17 1134  GLUCAP 107* 151* 162* 157* 158*      Scheduled Meds: . enoxaparin (LOVENOX) injection  40 mg Subcutaneous Q24H  . insulin aspart  0-15 Units Subcutaneous Q4H  . sodium chloride flush  10-40 mL Intracatheter Q12H  . sodium chloride flush  3 mL Intravenous Q12H   Continuous Infusions: . Marland KitchenTPN (CLINIMIX-E) Adult    . fat emulsion    . piperacillin-tazobactam (ZOSYN)  IV Stopped (10/23/17 0842)  . Marland KitchenTPN (CLINIMIX-E) Adult 83 mL/hr at 10/22/17 1739    Assessment/Plan:  1. Intestinal perforation with abdominal pain, and abdominal distention.  With exploratory surgery there was no obvious source of the intestinal perforation.  Patient on TPN.  Patient on empiric antibiotics.  Continue supportive  care, slow to improve 2. Hypokalemia.  Replace with TPN 3. Hypomagnesemia replaced 4. History of metastatic melanoma on immunotherapy as outpatient  Code Status:     Code Status Orders  (From admission, onward)        Start     Ordered   10/10/17 1809  Full code  Continuous     10/10/17 1808    Code Status History    Date Active Date Inactive Code Status Order ID Comments User Context   This patient has a current code status but no historical code status.    Advance Directive Documentation     Most Recent Value  Type of Advance Directive  Healthcare Power of Attorney  Pre-existing out of facility DNR order (yellow form or pink MOST form)  No data  "MOST" Form in Place?  No data      Disposition Plan: To be determined  Consultants:  General surgery  Oncology  Time spent: 20 minutes  [place], [facility]

## 2017-10-23 NOTE — Progress Notes (Signed)
PHARMACY - ADULT TOTAL PARENTERAL NUTRITION CONSULT NOTE   Pharmacy Consult for TPN and intralipid Indication: malnourishment secondary to ileus   Patient Measurements: Height: 5\' 11"  (180.3 cm) Weight: 188 lb 4.8 oz (85.4 kg) IBW/kg (Calculated) : 75.3 TPN AdjBW (KG): 89.4 Body mass index is 26.26 kg/m.   Assessment: Phos: 3.8 K+: 3.0   GI: ileus  Endo:  Insulin requirements in the past 24 hours: 13 units Lytes: Renal:  Hepatobil: ID: Anti-infectives (From admission, onward)   Start     Dose/Rate Route Frequency Ordered Stop   10/14/17 1515  piperacillin-tazobactam (ZOSYN) IVPB 3.375 g     3.375 g 12.5 mL/hr over 240 Minutes Intravenous Every 8 hours 10/14/17 1507         TPN Access: 12/27  TPN start date: 12/27 Nutritional Goals (per RD recommendation on 12.27.18): KCal: 2233kcal Protein: 100gm  Fluid: 2232 ml fluid daily   Goal TPN rate is 83 ml/hr (provides 2233kcal, 100gm protein, 2232 ml fluid daily)  Current Nutrition:   Plan: Phos WNL today. Replacing K+ with KCl 62mEq x 4 IV.    Continue with Clinimix TPN 5/20 with electrolytes at 83 mL/hr with Multi vit and trace.  Continue lipids 20% @ 54ml/hr x 12 hr  Novolog 0-15units SSI and adjust as needed No need for insulin in TPN bag currently Monitor electrolytes, glucose Will check electrolytes with AM labs.  Weekly CBC, diff, CMP, TG and prealbumin  Paulina Fusi, PharmD, BCPS 10/23/2017 12:45 PM

## 2017-10-23 NOTE — Progress Notes (Signed)
PT Cancellation Note  Patient Details Name: Louis Davies MRN: [id number] DOB: [birth year]   Cancelled Treatment:    Reason Eval/Treat Not Completed: Patient declined, no reason specified.  Pt appearing to be sleeping upon PT arrival with family present who reported pt recently fell asleep/was resting in bed after receiving nausea meds.  Pt's family member asked pt if he wanted to work with PT but pt declined reporting he did not feel up to it at this time.  Pt encouraged to ambulate with staff when he felt better.  Leitha Bleak, PT 10/23/17, 3:52 PM [phone]

## 2017-10-23 NOTE — Progress Notes (Addendum)
ADDENDUM: Volume from NG tube decreasing (especially when considering 5 - 6 cups of ice chips patient has been eating/drinking), patient reports 3 small BM's today with a solitary episode of flatus with 2 of those BM's. Discussed with patient possibly removing NG tube today, but patient wishes to have it removed with more confidence tomorrow morning. Will plan to remove NG tube tomorrow morning and start clear liquid diet tomorrow morning.  -- Marilynne Drivers. Rosana Hoes, MD, [place]: [facility] - Partnering for exceptional care. Office: [place] Hospital Day(s): 11.   Post op day(s): 9 Days Post-Op.   Interval History: Patient seen and examined, no acute events or new complaints overnight. NG tube has drained <1 Liter for the first 24-hour period since surgery this admission, and patient admits to eating/drinking 5 - 6 cups of ice chips over the past 24 hours. He continues to report "gas pain" and passed to small "smears" of BM, the second with a single episode of flatus this morning. Patient also said he ambulated once yesterday. He otherwise denies abdominal pain, N/V, fever/chills, CP, or SOB.  Review of Systems:  Constitutional: denies fever, chills  HEENT: denies cough or congestion  Respiratory: denies any shortness of breath  Cardiovascular: denies chest pain or palpitations  Gastrointestinal: abdominal pain, N/V, and bowel function as per interval history Genitourinary: denies burning with urination or urinary frequency Musculoskeletal: denies pain, decreased motor or sensation Integumentary: denies any other rashes or skin discolorations except post-surgical abdominal wound Neurological: denies HA or vision/hearing changes   Vital signs in last 24 hours: [min-max] current  Temp:  [97.8 F (36.6 C)-97.9 F (36.6 C)] 97.8 F (36.6 C) (01/04 0432) Pulse Rate:  [73-78] 78 (01/04 0432) Resp:  [18] 18 (01/04  0432) BP: (109-115)/(66-70) 115/70 (01/04 0432) SpO2:  [94 %-97 %] 94 % (01/04 0432) Weight:  [188 lb 4.8 oz (85.4 kg)] 188 lb 4.8 oz (85.4 kg) (01/04 0441)     Height: 5\' 11"  (180.3 cm) Weight: 188 lb 4.8 oz (85.4 kg) BMI (Calculated): 26.27   Intake/Output this shift:  Total I/O In: -  Out: 300 [Urine:300]   Intake/Output last 2 shifts:  @IOLAST2SHIFTS @   Physical Exam:  Constitutional: alert, cooperative and no distress  HENT: normocephalic without obvious abnormality  Eyes: PERRL, EOM's grossly intact and symmetric  Neuro: CN II - XII grossly intact and symmetric without deficit  Respiratory: breathing non-labored at rest  Cardiovascular: regular rate and sinus rhythm  Gastrointestinal: soft, non-tender, and non-distended, post-surgical incision well-approximated without erythema or drainage Musculoskeletal: UE and LE FROM, no edema or wounds, motor and sensation grossly intact, NT   Labs:  CBC Latest Ref Rng & Units 10/19/2017 10/16/2017 10/15/2017  WBC 3.8 - 10.6 K/uL 4.8 6.8 8.5  Hemoglobin 13.0 - 18.0 g/dL 12.1(L) 12.1(L) 12.5(L)  Hematocrit 40.0 - 52.0 % 37.0(L) 37.5(L) 38.3(L)  Platelets 150 - 440 K/uL 241 211 209   CMP Latest Ref Rng & Units 10/22/2017 10/21/2017 10/19/2017  Glucose 65 - 99 mg/dL 145(H) - 152(H)  BUN 6 - 20 mg/dL 33(H) - 22(H)  Creatinine 0.61 - 1.24 mg/dL 1.17 - 0.83  Sodium 135 - 145 mmol/L 139 - 137  Potassium 3.5 - 5.1 mmol/L 3.3(L) 3.4(L) 3.7  Chloride 101 - 111 mmol/L 98(L) - 101  CO2 22 - 32 mmol/L 33(H) - 26  Calcium 8.9 - 10.3 mg/dL 8.3(L) - 8.4(L)  Total Protein 6.5 -  8.1 g/dL 6.4(L) - 6.3(L)  Total Bilirubin 0.3 - 1.2 mg/dL 0.3 - 0.4  Alkaline Phos 38 - 126 U/L 58 - 49  AST 15 - 41 U/L 27 - 43(H)  ALT 17 - 63 U/L 36 - 52   Imaging studies: No new pertinent imaging studies   Assessment/Plan:(ICD-10's: K59.00,K66.8) 67 y.o.malewith slowly improving/resolvingpost-operative ileus and TPN for prolonged NPO9Days Post-Ops/p  unrevealing uneventful exploratory laparotomyfor pneumoperitoneum with extensive examination and manipulation of bowel, complicated by pertinent comorbidities includingrecent immunotherapy for metastatic melanoma, mild chronic anemia, and osteoarthritis.  - pain control prn (minimize narcotics) - continue NPO with TPN, correct electrolytes - monitor ongoing abdominal exam and bowel function  - anticipate flatus and removal of NG tube today or more likely tomorrow  - will plan to start clear liquids and continue TPN to supplement once NG tube removed - medical management of comorbidities as per primary medical team - DVT prophylaxis, ambulation encouraged  All of the above findings and recommendations were discussed with the patient, and all of patient's questions were answered to his expressed satisfaction.  Thank you for the opportunity to participate in this patient's care.  -- Marilynne Drivers Rosana Hoes, MD, [place]: [facility] - Partnering for exceptional care. Office: [phone]

## 2017-10-24 LAB — GLUCOSE, CAPILLARY
GLUCOSE-CAPILLARY: 111 mg/dL — AB (ref 65–99)
GLUCOSE-CAPILLARY: 147 mg/dL — AB (ref 65–99)
GLUCOSE-CAPILLARY: 150 mg/dL — AB (ref 65–99)
Glucose-Capillary: 113 mg/dL — ABNORMAL HIGH (ref 65–99)
Glucose-Capillary: 115 mg/dL — ABNORMAL HIGH (ref 65–99)
Glucose-Capillary: 132 mg/dL — ABNORMAL HIGH (ref 65–99)
Glucose-Capillary: 145 mg/dL — ABNORMAL HIGH (ref 65–99)
Glucose-Capillary: 153 mg/dL — ABNORMAL HIGH (ref 65–99)

## 2017-10-24 LAB — BASIC METABOLIC PANEL
ANION GAP: 9 (ref 5–15)
BUN: 29 mg/dL — ABNORMAL HIGH (ref 6–20)
CO2: 35 mmol/L — AB (ref 22–32)
CREATININE: 1.09 mg/dL (ref 0.61–1.24)
Calcium: 8.5 mg/dL — ABNORMAL LOW (ref 8.9–10.3)
Chloride: 93 mmol/L — ABNORMAL LOW (ref 101–111)
GLUCOSE: 134 mg/dL — AB (ref 65–99)
Potassium: 3 mmol/L — ABNORMAL LOW (ref 3.5–5.1)
Sodium: 137 mmol/L (ref 135–145)

## 2017-10-24 MED ORDER — BOOST / RESOURCE BREEZE PO LIQD CUSTOM
1.0000 | Freq: Three times a day (TID) | ORAL | Status: DC
  Administered 2017-10-24 – 2017-10-26 (×4): 1 via ORAL

## 2017-10-24 MED ORDER — POTASSIUM CHLORIDE 10 MEQ/100ML IV SOLN
10.0000 meq | INTRAVENOUS | Status: AC
  Administered 2017-10-24 (×4): 10 meq via INTRAVENOUS
  Filled 2017-10-24 (×3): qty 100

## 2017-10-24 MED ORDER — TRACE MINERALS CR-CU-MN-SE-ZN 10-1000-500-60 MCG/ML IV SOLN
INTRAVENOUS | Status: AC
  Administered 2017-10-24: 18:00:00 via INTRAVENOUS
  Filled 2017-10-24: qty 1992

## 2017-10-24 MED ORDER — IPRATROPIUM-ALBUTEROL 0.5-2.5 (3) MG/3ML IN SOLN: 3.0000 mL | RESPIRATORY_TRACT | Status: DC

## 2017-10-24 MED ORDER — FAT EMULSION 20 % IV EMUL
250.0000 mL | INTRAVENOUS | Status: AC
  Administered 2017-10-24: 250 mL via INTRAVENOUS
  Filled 2017-10-24: qty 250

## 2017-10-24 NOTE — Progress Notes (Addendum)
[place] Hospital Day(s): 12.   Post op day(s): 10 Days Post-Op.   Interval History: Patient seen and examined, no acute events or new complaints overnight, except patient now reports he's been passing flatus consistently overnight, denies abdominal pain, N/V, fever/chills, CP, or SOB.  Review of Systems:  Constitutional: denies fever, chills  HEENT: denies cough or congestion  Respiratory: denies any shortness of breath  Cardiovascular: denies chest pain or palpitations  Gastrointestinal: abdominal pain, N/V, and bowel function as per interval history Genitourinary: denies burning with urination or urinary frequency Musculoskeletal: denies pain, decreased motor or sensation Integumentary: denies any other rashes or skin discolorations except post-surgical abdominal wound Neurological: denies HA or vision/hearing changes   Vital signs in last 24 hours: [min-max] current  Temp:  [97.5 F (36.4 C)-99.4 F (37.4 C)] 98.8 F (37.1 C) (01/05 0417) Pulse Rate:  [79-90] 79 (01/05 0417) Resp:  [18] 18 (01/05 0417) BP: (108-109)/(54-74) 109/54 (01/05 0417) SpO2:  [94 %-97 %] 94 % (01/05 0417) Weight:  [188 lb 9.6 oz (85.5 kg)] 188 lb 9.6 oz (85.5 kg) (01/05 0417)     Height: 5\' 11"  (180.3 cm) Weight: 188 lb 9.6 oz (85.5 kg) BMI (Calculated): 26.32   Intake/Output this shift:  No intake/output data recorded.   Intake/Output last 2 shifts:  @IOLAST2SHIFTS @   Physical Exam:  Constitutional: alert, cooperative and no distress  HENT: normocephalic without obvious abnormality  Eyes: PERRL, EOM's grossly intact and symmetric  Neuro: CN II - XII grossly intact and symmetric without deficit  Respiratory: breathing non-labored at rest  Cardiovascular: regular rate and sinus rhythm  Gastrointestinal: soft, non-tender, and non-distended with post-surgical incision well-approximated without erythema or drainage Musculoskeletal: UE and LE FROM, no edema or wounds, motor and  sensation grossly intact, NT   Labs:  CBC Latest Ref Rng & Units 10/19/2017 10/16/2017 10/15/2017  WBC 3.8 - 10.6 K/uL 4.8 6.8 8.5  Hemoglobin 13.0 - 18.0 g/dL 12.1(L) 12.1(L) 12.5(L)  Hematocrit 40.0 - 52.0 % 37.0(L) 37.5(L) 38.3(L)  Platelets 150 - 440 K/uL 241 211 209   CMP Latest Ref Rng & Units 10/24/2017 10/23/2017 10/22/2017  Glucose 65 - 99 mg/dL 134(H) - 145(H)  BUN 6 - 20 mg/dL 29(H) - 33(H)  Creatinine 0.61 - 1.24 mg/dL 1.09 - 1.17  Sodium 135 - 145 mmol/L 137 - 139  Potassium 3.5 - 5.1 mmol/L 3.0(L) 3.0(L) 3.3(L)  Chloride 101 - 111 mmol/L 93(L) - 98(L)  CO2 22 - 32 mmol/L 35(H) - 33(H)  Calcium 8.9 - 10.3 mg/dL 8.5(L) - 8.3(L)  Total Protein 6.5 - 8.1 g/dL - - 6.4(L)  Total Bilirubin 0.3 - 1.2 mg/dL - - 0.3  Alkaline Phos 38 - 126 U/L - - 58  AST 15 - 41 U/L - - 27  ALT 17 - 63 U/L - - 36   Imaging studies: No new pertinent imaging studies   Assessment/Plan:(ICD-10's: K59.00,K66.8) 66 y.o.malewith resolvingpost-operative ileus and ongoing TPN for prolonged NPO10Days Post-Ops/p unrevealing exploratory laparotomyfor pneumoperitoneum with extensive examination and manipulation of bowel (prolonged ileus expected), complicated bypertinentcomorbidities includingrecent immunotherapy for metastatic melanoma, mild chronic anemia, and osteoarthritis.  - pain control prn (minimize narcotics) - NG tube removed and clear liquids diet ordered - monitor ongoing abdominal exam and bowel function  - will continue TPN for now and likely half rate tonight or tomorrow before discontinuing - medical management of comorbidities as per primary medical team - DVT prophylaxis, ambulation encouraged  All of the above findings and  recommendations were discussed with the patient, and all of patient's questions were answered tohisexpressed satisfaction.  Thank you for the opportunity to participate in this patient's  care.  -- Marilynne Drivers Rosana Hoes, MD, [place]: [facility] - Partnering for exceptional care. Office: [phone]

## 2017-10-24 NOTE — Progress Notes (Signed)
Patient ID: Louis Davies, male   DOB: [birth year], 67 y.o.   MRN: [id number]    [facility] PROGRESS NOTE  Louis Davies MEQ:[id number] DOB: [birth year] DOA: 10/10/2017 PCP: [facility], [facility]  HPI/Subjective:  Pt had is NGT out today   Objective: Vitals:   10/24/17 1000 10/24/17 1153  BP: 105/67 117/64  Pulse: 74 80  Resp: 18 20  Temp: (!) 97.5 F (36.4 C) 97.9 F (36.6 C)  SpO2: 95% 97%    Filed Weights   10/22/17 0500 10/23/17 0441 10/24/17 0417  Weight: 189 lb 12.8 oz (86.1 kg) 188 lb 4.8 oz (85.4 kg) 188 lb 9.6 oz (85.5 kg)    ROS: Review of Systems  Constitutional: Negative for chills and fever.  Eyes: Negative for blurred vision.  Respiratory: Negative for cough and shortness of breath.   Cardiovascular: Negative for chest pain.  Gastrointestinal: Positive for abdominal pain. Negative for constipation, diarrhea, nausea and vomiting.  Genitourinary: Negative for dysuria.  Musculoskeletal: Negative for joint pain.  Neurological: Negative for dizziness and headaches.   Exam: Physical Exam  Constitutional: He is oriented to person, place, and time.  HENT:  Nose: No mucosal edema.  Mouth/Throat: No oropharyngeal exudate or posterior oropharyngeal edema.  Eyes: Conjunctivae, EOM and lids are normal. Pupils are equal, round, and reactive to light.  Neck: No JVD present. Carotid bruit is not present. No edema present. No thyroid mass and no thyromegaly present.  Cardiovascular: S1 normal and S2 normal. Exam reveals no gallop.  No murmur heard. Pulses:      Dorsalis pedis pulses are 2+ on the right side, and 2+ on the left side.  Respiratory: No respiratory distress. He has decreased breath sounds in the right lower field and the left lower field. He has no wheezes. He has no rhonchi. He has no rales.  GI: Soft. Bowel sounds are normal. He exhibits distension. There is generalized tenderness.  Musculoskeletal:       Right ankle: He exhibits no swelling.        Left ankle: He exhibits no swelling.  Lymphadenopathy:    He has no cervical adenopathy.  Neurological: He is alert and oriented to person, place, and time. No cranial nerve deficit.  Skin: Skin is warm. No rash noted. Nails show no clubbing.  Psychiatric: He has a normal mood and affect.      Data Reviewed: Basic Metabolic Panel: Recent Labs  Lab 10/18/17 0423 10/19/17 0326 10/21/17 0519 10/22/17 0234 10/23/17 0603 10/24/17 0338  NA  --  137  --  139  --  137  K 3.4* 3.7 3.4* 3.3* 3.0* 3.0*  CL  --  101  --  98*  --  93*  CO2  --  26  --  33*  --  35*  GLUCOSE  --  152*  --  145*  --  134*  BUN  --  22*  --  33*  --  29*  CREATININE  --  0.83  --  1.17  --  1.09  CALCIUM  --  8.4*  --  8.3*  --  8.5*  MG 2.0  --  2.3  --   --   --   PHOS 4.0  --  5.0* 3.9 3.8  --    Liver Function Tests: Recent Labs  Lab 10/19/17 0326 10/22/17 0234  AST 43* 27  ALT 52 36  ALKPHOS 49 58  BILITOT 0.4 0.3  PROT 6.3* 6.4*  ALBUMIN  2.7* 2.8*   CBC: Recent Labs  Lab 10/19/17 0326  WBC 4.8  NEUTROABS 3.3  HGB 12.1*  HCT 37.0*  MCV 77.9*  PLT 241     CBG: Recent Labs  Lab 10/24/17 0008 10/24/17 0412 10/24/17 0743 10/24/17 1008 10/24/17 1149  GLUCAP 145* 147* 153* 132* 150*      Scheduled Meds: . enoxaparin (LOVENOX) injection  40 mg Subcutaneous Q24H  . feeding supplement  1 Container Oral TID BM  . insulin aspart  0-15 Units Subcutaneous Q4H  . sodium chloride flush  10-40 mL Intracatheter Q12H  . sodium chloride flush  3 mL Intravenous Q12H   Continuous Infusions: . Marland KitchenTPN (CLINIMIX-E) Adult 83 mL/hr at 10/23/17 1727  . Marland KitchenTPN (CLINIMIX-E) Adult    . fat emulsion    . piperacillin-tazobactam (ZOSYN)  IV 3.375 g (10/24/17 1033)  . potassium chloride 10 mEq (10/24/17 1407)    Assessment/Plan:  1. Intestinal perforation with abdominal pain, and abdominal distention.  With exploratory surgery there was no obvious source of the intestinal perforation.   Patient on TPN.  Patient on empiric antibiotics.  Continue supportive care, slow to improve, ngt taken out today 2. Hypokalemia.  Replace with TPN 3. Hypomagnesemia replaced 4. History of metastatic melanoma on immunotherapy as outpatient  Code Status:     Code Status Orders  (From admission, onward)        Start     Ordered   10/10/17 1809  Full code  Continuous     10/10/17 1808    Code Status History    Date Active Date Inactive Code Status Order ID Comments User Context   This patient has a current code status but no historical code status.    Advance Directive Documentation     Most Recent Value  Type of Advance Directive  Healthcare Power of Attorney  Pre-existing out of facility DNR order (yellow form or pink MOST form)  No data  "MOST" Form in Place?  No data      Disposition Plan: To be determined  Consultants:  General surgery  Oncology  Time spent: 20 minutes  [place], [facility]

## 2017-10-24 NOTE — Plan of Care (Signed)
Pt has reported nausea twice thus far this shift and was given meds for nausea. Pt report relief after each dose. Pt still has brown looking fluids coming from NG tube. Pt report no pain thus far. No other signs of distress noted. Will continue to monitor.

## 2017-10-24 NOTE — Progress Notes (Addendum)
PHARMACY - ADULT TOTAL PARENTERAL NUTRITION CONSULT NOTE   Pharmacy Consult for TPN and intralipid Indication: malnourishment secondary to ileus   Patient Measurements: Height: 5\' 11"  (180.3 cm) Weight: 188 lb 9.6 oz (85.5 kg) IBW/kg (Calculated) : 75.3 TPN AdjBW (KG): 89.4 Body mass index is 26.3 kg/m.   Assessment:  K+: 3.0   GI: ileus  Endo: BG 131-158 - within goal range Insulin requirements in the past 24 hours: 14 units Lytes: K 3.0   TPN Access: 12/27  TPN start date: 12/27 Nutritional Goals (per RD recommendation on 12.27.18): KCal: 2233kcal Protein: 100gm  Fluid: 2232 ml fluid daily   Goal TPN rate is 83 ml/hr (provides 2233kcal, 100gm protein, 2232 ml fluid daily)  Current Nutrition: clear liquid diet  Plan: Replacing K+ with KCl 10 mEq x 4 runs IV.    Continue with Clinimix TPN 5/20 with electrolytes at 83 mL/hr with Multivitamin and trace.  Continue lipids 20% @ 82ml/hr x 12 hr  Novolog 0-15units SSI and adjust as needed No need for insulin in TPN bag currently Monitor electrolytes, glucose Will check electrolytes with AM labs.  Weekly CBC, diff, CMP, TG and prealbumin  Of note, surgeon's note from today states: will continue TPN for now and likely half rate tomorrow before discontinuing. Will need to follow up on TPN plan tomorrow and whether pt is eating enough.   Pharmacy will continue to follow.   Rayna Sexton, PharmD, BCPS Clinical Pharmacist 10/24/2017 9:11 AM

## 2017-10-24 NOTE — Progress Notes (Signed)
PT Cancellation Note  Patient Details Name: Louis Davies MRN: [id number] DOB: 1951-05-11   Cancelled Treatment:    Reason Eval/Treat Not Completed: Fatigue/lethargy limiting ability to participate   Pt offered session.  Pt stated he has been feeling week x 2 days and has not ambulated recently.  Stated he tried to sit this morning but became weak and dizzy.  "I had to sit or I was going to fall".  Pt stated he did not have nursing staff with him and he was encouraged to not get up without nursing assist.  He verbally agreed.  Stated he did not feel like he could walk today due to weakness.    Original discharge plan was for HHPT.  May need to review disposition prior to discharge if weakness and poor mobility continue.   Chesley Noon 10/24/2017, 12:20 PM

## 2017-10-25 LAB — BASIC METABOLIC PANEL
ANION GAP: 9 (ref 5–15)
BUN: 28 mg/dL — ABNORMAL HIGH (ref 6–20)
CHLORIDE: 94 mmol/L — AB (ref 101–111)
CO2: 34 mmol/L — AB (ref 22–32)
Calcium: 8.5 mg/dL — ABNORMAL LOW (ref 8.9–10.3)
Creatinine, Ser: 1.04 mg/dL (ref 0.61–1.24)
GFR calc Af Amer: >60 mL/min (ref >60)
GFR calc non Af Amer: >60 mL/min (ref >60)
GLUCOSE: 137 mg/dL — AB (ref 65–99)
POTASSIUM: 3 mmol/L — AB (ref 3.5–5.1)
Sodium: 137 mmol/L (ref 135–145)

## 2017-10-25 LAB — GLUCOSE, CAPILLARY
GLUCOSE-CAPILLARY: 129 mg/dL — AB (ref 65–99)
GLUCOSE-CAPILLARY: 111 mg/dL — AB (ref 65–99)
Glucose-Capillary: 140 mg/dL — ABNORMAL HIGH (ref 65–99)
Glucose-Capillary: 133 mg/dL — ABNORMAL HIGH (ref 65–99)
Glucose-Capillary: 88 mg/dL (ref 65–99)

## 2017-10-25 LAB — PHOSPHORUS: Phosphorus: 3.7 mg/dL (ref 2.5–4.6)

## 2017-10-25 LAB — MAGNESIUM: Magnesium: 2 mg/dL (ref 1.7–2.4)

## 2017-10-25 MED ORDER — FAT EMULSION 20 % IV EMUL
250.0000 mL | INTRAVENOUS | Status: AC
  Administered 2017-10-25: 250 mL via INTRAVENOUS
  Filled 2017-10-25: qty 250

## 2017-10-25 MED ORDER — POTASSIUM CHLORIDE 10 MEQ/100ML IV SOLN
10.0000 meq | INTRAVENOUS | Status: AC
  Administered 2017-10-25 (×4): 10 meq via INTRAVENOUS
  Filled 2017-10-25 (×3): qty 100

## 2017-10-25 MED ORDER — TRACE MINERALS CR-CU-MN-SE-ZN 10-1000-500-60 MCG/ML IV SOLN
INTRAVENOUS | Status: DC
  Administered 2017-10-25: 17:00:00 via INTRAVENOUS
  Filled 2017-10-25: qty 1992

## 2017-10-25 NOTE — Progress Notes (Signed)
[facility] [facility] Day(s): 13.   Post op day(s): 11 Days Post-Op.   Interval History: Patient seen and examined, reports was tolerating and enjoying clear liquids and then full liquids diet with ongoing +flatus and +BM's until a single episode of nausea and non-bilious emesis this morning after patient says he ate too much too quickly. He otherwise reports he ambulated once yesterday, intends to ambulate more today, and denies abdominal pain, fever/chills, CP, or SOB.  Review of Systems:  Constitutional: denies fever, chills  HEENT: denies cough or congestion  Respiratory: denies any shortness of breath  Cardiovascular: denies chest pain or palpitations  Gastrointestinal: abdominal pain, N/V, and bowel function as per interval history Genitourinary: denies burning with urination or urinary frequency Musculoskeletal: denies pain, decreased motor or sensation Integumentary: denies any other rashes or skin discolorations except post-surgical abdominal wound Neurological: denies HA or vision/hearing changes   Vital signs in last 24 hours: [min-max] current  Temp:  [97.4 F (36.3 C)-98.2 F (36.8 C)] 98.2 F (36.8 C) (01/06 0558) Pulse Rate:  [74-91] 77 (01/06 0558) Resp:  [18-20] 18 (01/06 0558) BP: (103-117)/(48-67) 115/48 (01/06 0558) SpO2:  [90 %-98 %] 98 % (01/06 0558) Weight:  [192 lb 11.2 oz (87.4 kg)] 192 lb 11.2 oz (87.4 kg) (01/06 0500)     Height: 5\' 11"  (180.3 cm) Weight: 192 lb 11.2 oz (87.4 kg) BMI (Calculated): 26.89   Intake/Output this shift:  No intake/output data recorded.   Intake/Output last 2 shifts:  @IOLAST2SHIFTS @   Physical Exam:  Constitutional: alert, cooperative and no distress  HENT: normocephalic without obvious abnormality  Eyes: PERRL, EOM's grossly intact and symmetric  Neuro: CN II - XII grossly intact and symmetric without deficit  Respiratory: breathing non-labored at rest  Cardiovascular: regular rate and sinus rhythm   Gastrointestinal: soft, non-tender, and non-distended, post-surgical incision well-approximated without erythema or drainage Musculoskeletal: UE and LE FROM, no edema or wounds, motor and sensation grossly intact, NT   Labs:  CBC Latest Ref Rng & Units 10/19/2017 10/16/2017 10/15/2017  WBC 3.8 - 10.6 K/uL 4.8 6.8 8.5  Hemoglobin 13.0 - 18.0 g/dL 12.1(L) 12.1(L) 12.5(L)  Hematocrit 40.0 - 52.0 % 37.0(L) 37.5(L) 38.3(L)  Platelets 150 - 440 K/uL 241 211 209   CMP Latest Ref Rng & Units 10/25/2017 10/24/2017 10/23/2017  Glucose 65 - 99 mg/dL 137(H) 134(H) -  BUN 6 - 20 mg/dL 28(H) 29(H) -  Creatinine 0.61 - 1.24 mg/dL 1.04 1.09 -  Sodium 135 - 145 mmol/L 137 137 -  Potassium 3.5 - 5.1 mmol/L 3.0(L) 3.0(L) 3.0(L)  Chloride 101 - 111 mmol/L 94(L) 93(L) -  CO2 22 - 32 mmol/L 34(H) 35(H) -  Calcium 8.9 - 10.3 mg/dL 8.5(L) 8.5(L) -  Total Protein 6.5 - 8.1 g/dL - - -  Total Bilirubin 0.3 - 1.2 mg/dL - - -  Alkaline Phos 38 - 126 U/L - - -  AST 15 - 41 U/L - - -  ALT 17 - 63 U/L - - -   Imaging studies: No new pertinent imaging studies   Assessment/Plan:(ICD-10's: K59.00,K66.8) 67 y.o.malewith resolvingpost-operative ileus and ongoing TPN for prolonged NPO11Days Post-Ops/p unrevealing exploratory laparotomyfor pneumoperitoneum with extensive examination and manipulation of bowel (prolonged ileus expected), complicated bypertinentcomorbidities includingrecent immunotherapy for metastatic melanoma, mild chronic anemia, and osteoarthritis.  - pain control prn (minimize narcotics) - will continue full liquids diet for now, 1/2 rate TPN - monitor ongoing abdominal exam and bowel function - medical management of comorbidities  as per primary medical team - DVT prophylaxis with ambulation encouraged  - discharge planning once tolerating PO  All of the above findings and recommendations were discussed with the patient, and all  of patient's questions were answered tohisexpressed satisfaction.  Thank you for the opportunity to participate in this patient's care.  -- Marilynne Drivers Rosana Hoes, MD, [place]: [facility] - Partnering for exceptional care. Office: [phone]

## 2017-10-25 NOTE — Progress Notes (Signed)
Patient ID: Louis Davies, male   DOB: 07/16/1951, 67 y.o.   MRN: [id number]    [facility] PROGRESS NOTE  KENDREW PACI JIR:[id number] DOB: 1950-11-09 DOA: 10/10/2017 PCP: [facility]  HPI/Subjective:  Patient has had his NG tube out since yesterday feels like his abdomen is full not able to eat much passing some gas but no bowel movement   Objective: Vitals:   10/25/17 0558 10/25/17 1246  BP: (!) 115/48 119/66  Pulse: 77 84  Resp: 18 20  Temp: 98.2 F (36.8 C) 98.2 F (36.8 C)  SpO2: 98% 97%    Filed Weights   10/23/17 0441 10/24/17 0417 10/25/17 0500  Weight: 188 lb 4.8 oz (85.4 kg) 188 lb 9.6 oz (85.5 kg) 192 lb 11.2 oz (87.4 kg)    ROS: Review of Systems  Constitutional: Negative for chills and fever.  Eyes: Negative for blurred vision.  Respiratory: Negative for cough and shortness of breath.   Cardiovascular: Negative for chest pain.  Gastrointestinal: Negative for abdominal pain, constipation, diarrhea, nausea and vomiting.  Genitourinary: Negative for dysuria.  Musculoskeletal: Negative for joint pain.  Neurological: Negative for dizziness and headaches.   Exam: Physical Exam  Constitutional: He is oriented to person, place, and time.  HENT:  Nose: No mucosal edema.  Mouth/Throat: No oropharyngeal exudate or posterior oropharyngeal edema.  Eyes: Conjunctivae, EOM and lids are normal. Pupils are equal, round, and reactive to light.  Neck: No JVD present. Carotid bruit is not present. No edema present. No thyroid mass and no thyromegaly present.  Cardiovascular: S1 normal and S2 normal. Exam reveals no gallop.  No murmur heard. Pulses:      Dorsalis pedis pulses are 2+ on the right side, and 2+ on the left side.  Respiratory: No respiratory distress. He has decreased breath sounds in the right lower field and the left lower field. He has no wheezes. He has no rhonchi. He has no rales.  GI: Soft. Bowel sounds are normal. He exhibits no  distension. There is no tenderness.  Musculoskeletal:       Right ankle: He exhibits no swelling.       Left ankle: He exhibits no swelling.  Lymphadenopathy:    He has no cervical adenopathy.  Neurological: He is alert and oriented to person, place, and time. No cranial nerve deficit.  Skin: Skin is warm. No rash noted. Nails show no clubbing.  Psychiatric: He has a normal mood and affect.      Data Reviewed: Basic Metabolic Panel: Recent Labs  Lab 10/19/17 0326 10/21/17 0519 10/22/17 0234 10/23/17 0603 10/24/17 0338 10/25/17 0352  NA 137  --  139  --  137 137  K 3.7 3.4* 3.3* 3.0* 3.0* 3.0*  CL 101  --  98*  --  93* 94*  CO2 26  --  33*  --  35* 34*  GLUCOSE 152*  --  145*  --  134* 137*  BUN 22*  --  33*  --  29* 28*  CREATININE 0.83  --  1.17  --  1.09 1.04  CALCIUM 8.4*  --  8.3*  --  8.5* 8.5*  MG  --  2.3  --   --   --  2.0  PHOS  --  5.0* 3.9 3.8  --  3.7   Liver Function Tests: Recent Labs  Lab 10/19/17 0326 10/22/17 0234  AST 43* 27  ALT 52 36  ALKPHOS 49 58  BILITOT 0.4 0.3  PROT 6.3* 6.4*  ALBUMIN 2.7* 2.8*   CBC: Recent Labs  Lab 10/19/17 0326  WBC 4.8  NEUTROABS 3.3  HGB 12.1*  HCT 37.0*  MCV 77.9*  PLT 241     CBG: Recent Labs  Lab 10/24/17 2035 10/24/17 2341 10/25/17 0359 10/25/17 0736 10/25/17 1155  GLUCAP 115* 111* 140* 133* 129*      Scheduled Meds: . enoxaparin (LOVENOX) injection  40 mg Subcutaneous Q24H  . feeding supplement  1 Container Oral TID BM  . insulin aspart  0-15 Units Subcutaneous Q4H  . sodium chloride flush  10-40 mL Intracatheter Q12H  . sodium chloride flush  3 mL Intravenous Q12H   Continuous Infusions: . Marland KitchenTPN (CLINIMIX-E) Adult 83 mL/hr at 10/24/17 1736  . Marland KitchenTPN (CLINIMIX-E) Adult    . fat emulsion    . piperacillin-tazobactam (ZOSYN)  IV 3.375 g (10/25/17 0841)    Assessment/Plan:  1. Intestinal perforation with abdominal pain, and abdominal distention.  With exploratory surgery there was no  obvious source of the intestinal perforation.  Patient on TPN.  Patient on empiric antibiotics.  Continue supportive care, encourage ambulation 2. Hypokalemia.  Replace with TPN 3. Hypomagnesemia replaced 4. History of metastatic melanoma on immunotherapy as outpatient  Code Status:     Code Status Orders  (From admission, onward)        Start     Ordered   10/10/17 1809  Full code  Continuous     10/10/17 1808    Code Status History    Date Active Date Inactive Code Status Order ID Comments User Context   This patient has a current code status but no historical code status.    Advance Directive Documentation     Most Recent Value  Type of Advance Directive  Healthcare Power of Attorney  Pre-existing out of facility DNR order (yellow form or pink MOST form)  No data  "MOST" Form in Place?  No data      Disposition Plan: To be determined  Consultants:  General surgery  Oncology  Time spent: 20 minutes  [place], [facility]

## 2017-10-25 NOTE — Progress Notes (Addendum)
PHARMACY - ADULT TOTAL PARENTERAL NUTRITION CONSULT NOTE   Pharmacy Consult for TPN and intralipid Indication: malnourishment secondary to ileus   Patient Measurements: Height: 5\' 11"  (180.3 cm) Weight: 192 lb 11.2 oz (87.4 kg) IBW/kg (Calculated) : 75.3 TPN AdjBW (KG): 89.4 Body mass index is 26.88 kg/m.   Assessment:  K+: 3.0   GI: ileus  Endo: BG 131-158 - within goal range Insulin requirements in the past 24 hours: 14 units Lytes: K 3.0   TPN Access: 12/27  TPN start date: 12/27 Nutritional Goals (per RD recommendation on 12.27.18): KCal: 2233kcal Protein: 100gm  Fluid: 2232 ml fluid daily   Goal TPN rate is 83 ml/hr (provides 2233kcal, 100gm protein, 2232 ml fluid daily)  Current Nutrition: clear liquid diet  Plan: Replacing K+ with KCl 10 mEq x 4 runs IV.    Continue with Clinimix TPN 5/20 with electrolytes at 83 mL/hr with Multivitamin and trace.  Continue lipids 20% @ 30ml/hr x 12 hr  Novolog 0-15units SSI and adjust as needed No need for insulin in TPN bag currently Monitor electrolytes, glucose Will check electrolytes with AM labs.  Weekly CBC, diff, CMP, TG and prealbumin  Of note, surgeon's note from today states: will continue TPN for now and likely half rate tomorrow before discontinuing. Will need to follow up on TPN plan tomorrow and whether pt is eating enough.   Pharmacy will continue to follow.   1/6 @ 14:30 MD order to decrease rate by half.  [facility] Clinical Pharmacist 10/25/2017 10:56 AM

## 2017-10-26 LAB — COMPREHENSIVE METABOLIC PANEL WITH GFR
ALT: 31 U/L (ref 17–63)
AST: 29 U/L (ref 15–41)
Albumin: 2.8 g/dL — ABNORMAL LOW (ref 3.5–5.0)
Alkaline Phosphatase: 67 U/L (ref 38–126)
Anion gap: 9 (ref 5–15)
BUN: 25 mg/dL — ABNORMAL HIGH (ref 6–20)
CO2: 30 mmol/L (ref 22–32)
Calcium: 8.4 mg/dL — ABNORMAL LOW (ref 8.9–10.3)
Chloride: 96 mmol/L — ABNORMAL LOW (ref 101–111)
Creatinine, Ser: 1.09 mg/dL (ref 0.61–1.24)
GFR calc Af Amer: >60 mL/min
GFR calc non Af Amer: >60 mL/min
Glucose, Bld: 117 mg/dL — ABNORMAL HIGH (ref 65–99)
Potassium: 3.1 mmol/L — ABNORMAL LOW (ref 3.5–5.1)
Sodium: 135 mmol/L (ref 135–145)
Total Bilirubin: 0.6 mg/dL (ref 0.3–1.2)
Total Protein: 6.3 g/dL — ABNORMAL LOW (ref 6.5–8.1)

## 2017-10-26 LAB — DIFFERENTIAL
Basophils Absolute: 0 K/uL (ref 0–0.1)
Basophils Relative: 1 %
Eosinophils Absolute: 0.2 K/uL (ref 0–0.7)
Eosinophils Relative: 4 %
Lymphocytes Relative: 15 %
Lymphs Abs: 0.9 K/uL — ABNORMAL LOW (ref 1.0–3.6)
Monocytes Absolute: 0.6 K/uL (ref 0.2–1.0)
Monocytes Relative: 10 %
Neutro Abs: 4.2 K/uL (ref 1.4–6.5)
Neutrophils Relative %: 70 %

## 2017-10-26 LAB — CBC
HCT: 35 % — ABNORMAL LOW (ref 40.0–52.0)
Hemoglobin: 11.8 g/dL — ABNORMAL LOW (ref 13.0–18.0)
MCH: 25.9 pg — ABNORMAL LOW (ref 26.0–34.0)
MCHC: 33.6 g/dL (ref 32.0–36.0)
MCV: 77 fL — AB (ref 80.0–100.0)
PLATELETS: 276 10*3/uL (ref 150–440)
RBC: 4.54 MIL/uL (ref 4.40–5.90)
RDW: 15.9 % — AB (ref 11.5–14.5)
WBC: 5.9 10*3/uL (ref 3.8–10.6)

## 2017-10-26 LAB — MAGNESIUM: Magnesium: 1.8 mg/dL (ref 1.7–2.4)

## 2017-10-26 LAB — TRIGLYCERIDES: Triglycerides: 237 mg/dL — ABNORMAL HIGH

## 2017-10-26 LAB — GLUCOSE, CAPILLARY
Glucose-Capillary: 100 mg/dL — ABNORMAL HIGH (ref 65–99)
Glucose-Capillary: 102 mg/dL — ABNORMAL HIGH (ref 65–99)
Glucose-Capillary: 119 mg/dL — ABNORMAL HIGH (ref 65–99)
Glucose-Capillary: 96 mg/dL (ref 65–99)

## 2017-10-26 LAB — PREALBUMIN: Prealbumin: 23.8 mg/dL (ref 18–38)

## 2017-10-26 MED ORDER — POTASSIUM CHLORIDE CRYS ER 20 MEQ PO TBCR
40.0000 meq | EXTENDED_RELEASE_TABLET | ORAL | Status: AC
  Administered 2017-10-26: 40 meq via ORAL
  Filled 2017-10-26: qty 2

## 2017-10-26 MED ORDER — POTASSIUM CHLORIDE CRYS ER 20 MEQ PO TBCR: 40.0000 meq | EXTENDED_RELEASE_TABLET | Freq: Once | ORAL | Status: DC

## 2017-10-26 MED ORDER — POTASSIUM CHLORIDE 10 MEQ/100ML IV SOLN: 10.0000 meq | INTRAVENOUS | Status: DC

## 2017-10-26 NOTE — Progress Notes (Signed)
PHARMACY - ADULT TOTAL PARENTERAL NUTRITION CONSULT NOTE   Pharmacy Consult for TPN and intralipid Indication: malnourishment secondary to ileus   Patient Measurements: Height: 5\' 11"  (180.3 cm) Weight: 192 lb 4.8 oz (87.2 kg) IBW/kg (Calculated) : 75.3 TPN AdjBW (KG): 89.4 Body mass index is 26.82 kg/m.   Assessment:  K+: 3.1  GI: ileus  Endo: BG 131-158 - within goal range Insulin requirements in the past 24 hours: 4 units   TPN Access: 12/27  TPN start date: 12/27  Current Nutrition: soft diet  Plan: TPN was stopped at 0930 this morning and will not be resumed.  Ordered KCl 40 mEq PO this morning.  Will sign off.  Lenis Noon, PharmD 10/26/17 12:08 PM

## 2017-10-26 NOTE — Progress Notes (Signed)
12 Days Post-Op   Subjective: Patient reports that he continues to feel better and better.  Having bowel function and tolerating a diet.  Still on TPN though.  Vital signs in last 24 hours: Temp:  [98.2 F (36.8 C)-98.3 F (36.8 C)] 98.3 F (36.8 C) (01/07 0416) Pulse Rate:  [78-84] 78 (01/07 0416) Resp:  [19-20] 19 (01/07 0416) BP: (96-119)/(50-66) 96/50 (01/07 0416) SpO2:  [95 %-97 %] 95 % (01/07 0416) Weight:  [87.2 kg (192 lb 4.8 oz)] 87.2 kg (192 lb 4.8 oz) (01/07 0423) Last BM Date: 10/25/17  Intake/Output from previous day: 01/06 0701 - 01/07 0700 In: 2298.4 [P.O.:240; I.V.:1808.4; IV Piggyback:250] Out: 600 [Urine:600]  GI: Abdomen is soft, nontender, nondistended.  Staples in place the midline without evidence of erythema or drainage.  Lab Results:  CBC Recent Labs    10/26/17 0345  WBC 5.9  HGB 11.8*  HCT 35.0*  PLT 276   CMP     Component Value Date/Time   NA 135 10/26/2017 0345   K 3.1 (L) 10/26/2017 0345   CL 96 (L) 10/26/2017 0345   CO2 30 10/26/2017 0345   GLUCOSE 117 (H) 10/26/2017 0345   BUN 25 (H) 10/26/2017 0345   CREATININE 1.09 10/26/2017 0345   CALCIUM 8.4 (L) 10/26/2017 0345   PROT 6.3 (L) 10/26/2017 0345   ALBUMIN 2.8 (L) 10/26/2017 0345   AST 29 10/26/2017 0345   ALT 31 10/26/2017 0345   ALKPHOS 67 10/26/2017 0345   BILITOT 0.6 10/26/2017 0345   GFRNONAA >60 10/26/2017 0345   GFRAA >60 10/26/2017 0345   PT/INR No results for input(s): LABPROT, INR in the last 72 hours.  Studies/Results: No results found.  Assessment/Plan: 67 year old male now 12 days status post exploratory laparotomy.  Doing very well.  Discussed that we would wean him off the TPN today.  Once he is off the TPN he should be ready for discharge.  Encourage ambulation and incentive spirometer use.   Clayburn Pert, MD [facility]  Day [facility] [phone] Night [facility] [phone]  10/26/2017

## 2017-10-26 NOTE — Progress Notes (Signed)
Pt states he feels much better today and has had a "good" bowel movement.  BM charted.

## 2017-10-26 NOTE — Discharge Instructions (Signed)

## 2017-10-26 NOTE — Progress Notes (Signed)
Pharmacy Antibiotic Note  Louis Davies is a 67 y.o. male admitted on 10/10/2017 with intra abdominal infection.  Pharmacy has been consulted for piperacillin/tazobactam dosing.  This is day #13 of IV antibiotics.   Plan: Continue piepracillin/tazobactam 3.375 g IV q8h EI  Height: 5\' 11"  (180.3 cm) Weight: 192 lb 4.8 oz (87.2 kg) IBW/kg (Calculated) : 75.3  Temp (24hrs), Avg:98.3 F (36.8 C), Min:98.2 F (36.8 C), Max:98.3 F (36.8 C)  Recent Labs  Lab 10/22/17 0234 10/24/17 0338 10/25/17 0352 10/26/17 0345  WBC  --   --   --  5.9  CREATININE 1.17 1.09 1.04 1.09    Estimated Creatinine Clearance: 69.1 mL/min (by C-G formula based on SCr of 1.09 mg/dL).    No Known Allergies  Antimicrobials this admission: Piperacillin/tazobactam 12/26 >>  Microbiology results: No results found for this or any previous visit (from the past 240 hour(s)).  Thank you for allowing pharmacy to be a part of this patient's care.  Lenis Noon, PharmD, BCPS Clinical Pharmacist 10/26/2017 10:10 AM

## 2017-10-28 NOTE — Discharge Summary (Signed)
[place] [facility] [place] NAME: Louis Davies    MR#:  [id number]  DATE OF BIRTH:  Feb 26, 1951  DATE OF ADMISSION:  10/10/2017   ADMITTING PHYSICIAN: Gorden Harms, MD  DATE OF DISCHARGE: 10/26/2017  3:00 PM  PRIMARY CARE PHYSICIAN: [facility]   ADMISSION DIAGNOSIS:  Ileus ([place]) [K56.7] Constipation [K59.00] Vomiting, intractability of vomiting not specified, presence of nausea not specified, unspecified vomiting type [R11.10] DISCHARGE DIAGNOSIS:  Active Problems:   Constipation   Ileus ([place])   Perforated viscus  SECONDARY DIAGNOSIS:   Past Medical History:  Diagnosis Date  . Anemia   . Arthritis   . Complication of anesthesia   . PONV (postoperative nausea and vomiting)    HOSPITAL COURSE:  67 year old male admitted for intestinal performation    1.  Intestinal perforation : now 12 days status post exploratory laparotomy.  Doing very well.  weaned him off the TPN today and tolerating PO well - Encourage ambulation and incentive spirometer use 1. Hypokalemia.  Replaced 2. Hypomagnesemia replaced 3. History of metastatic melanoma on immunotherapy as outpatient DISCHARGE CONDITIONS:  stable CONSULTS OBTAINED:  Treatment Team:  Clayburn Pert, MD DRUG ALLERGIES:  No Known Allergies DISCHARGE MEDICATIONS:   Allergies as of 10/26/2017   No Known Allergies     Medication List    TAKE these medications   bisacodyl 5 MG EC tablet Generic drug:  bisacodyl Take 5 mg by mouth daily as needed for moderate constipation.   multivitamin tablet Take 1 tablet by mouth daily.        DISCHARGE INSTRUCTIONS:   DIET:  Regular diet DISCHARGE CONDITION:  Good ACTIVITY:  Activity as tolerated OXYGEN:  Home Oxygen: No.  Oxygen Delivery: room air DISCHARGE LOCATION:  home   If you experience worsening of your admission symptoms, develop shortness of breath, life threatening emergency, suicidal or homicidal  thoughts you must seek medical attention immediately by calling 911 or calling your MD immediately  if symptoms less severe.  You Must read complete instructions/literature along with all the possible adverse reactions/side effects for all the Medicines you take and that have been prescribed to you. Take any new Medicines after you have completely understood and accpet all the possible adverse reactions/side effects.   Please note  You were cared for by a hospitalist during your hospital stay. If you have any questions about your discharge medications or the care you received while you were in the hospital after you are discharged, you can call the unit and asked to speak with the hospitalist on call if the hospitalist that took care of you is not available. Once you are discharged, your primary care physician will handle any further medical issues. Please note that NO REFILLS for any discharge medications will be authorized once you are discharged, as it is imperative that you return to your primary care physician (or establish a relationship with a primary care physician if you do not have one) for your aftercare needs so that they can reassess your need for medications and monitor your lab values.    On the day of Discharge:  VITAL SIGNS:  Blood pressure (!) 96/50, pulse 78, temperature 98.3 F (36.8 C), temperature source Oral, resp. rate 19, height 5\' 11"  (1.803 m), weight 87.2 kg (192 lb 4.8 oz), SpO2 95 %. PHYSICAL EXAMINATION:  GENERAL:  67 y.o.-year-old patient lying in the bed with no acute distress.  EYES: Pupils equal, round, reactive to  light and accommodation. No scleral icterus. Extraocular muscles intact.  HEENT: Head atraumatic, normocephalic. Oropharynx and nasopharynx clear.  NECK:  Supple, no jugular venous distention. No thyroid enlargement, no tenderness.  LUNGS: Normal breath sounds bilaterally, no wheezing, rales,rhonchi or crepitation. No use of accessory muscles of  respiration.  CARDIOVASCULAR: S1, S2 normal. No murmurs, rubs, or gallops.  ABDOMEN: Soft, non-tender, non-distended. Bowel sounds present. No organomegaly or mass.  EXTREMITIES: No pedal edema, cyanosis, or clubbing.  NEUROLOGIC: Cranial nerves II through XII are intact. Muscle strength 5/5 in all extremities. Sensation intact. Gait not checked.  PSYCHIATRIC: The patient is alert and oriented x 3.  SKIN: No obvious rash, lesion, or ulcer.  DATA REVIEW:   CBC Recent Labs  Lab 10/26/17 0345  WBC 5.9  HGB 11.8*  HCT 35.0*  PLT 276    Chemistries  Recent Labs  Lab 10/26/17 0345  NA 135  K 3.1*  CL 96*  CO2 30  GLUCOSE 117*  BUN 25*  CREATININE 1.09  CALCIUM 8.4*  MG 1.8  AST 29  ALT 31  ALKPHOS 67  BILITOT 0.6     Follow-up Information    [facility], [employer]. Go on 11/04/2017.   Specialty:  Family Medicine Why:  @11 :20 AM Contact information: [place] [address] [phone]        Lloyd Huger, MD. Go on 11/03/2017.   Specialty:  Oncology Why:  @10 :45 AM Contact information: [place] [address] [phone]        Jules Husbands, MD. Go on 11/02/2017.   Specialty:  General Surgery Why:  @3 :15 PM Contact information: [place] [phone]           Management plans discussed with the patient, family and they are in agreement.  CODE STATUS: Prior   TOTAL TIME TAKING CARE OF THIS PATIENT: 45 minutes.    Max Sane M.D on 10/28/2017 at 5:04 PM  Between 7am to 6pm - Pager - [phone]  After 6pm go to [email or web address] - Proofreader  [facility]  Office  [phone]  CC: Primary care physician; [facility], [facility]   Note: This dictation was prepared with Dragon dictation along with smaller phrase technology. Any transcriptional errors that result from this process are unintentional.

## 2017-11-01 NOTE — Progress Notes (Signed)
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  INTERVAL HISTORY: Patient returns to clinic today for hospital follow-up and treatment planning.  He is nearly fully recovered from his surgery to repair intestinal perforation.  He no longer has any pain.  He lost a significant amount of weight while in the hospital, but this has stabilized and his appetite is improved.  He has no neurologic complaints. He denies any recent fevers. He has no chest pain, cough, or hemoptysis. He denies any nausea, vomiting, constipation, or diarrhea. He has no melena or hematochezia. He has no urinary complaints. Patient offers no further specific complaints today.  REVIEW OF SYSTEMS:   Review of Systems  Constitutional: Positive for malaise/fatigue and weight loss. Negative for fever.  Respiratory: Negative.  Negative for cough and shortness of breath.   Cardiovascular: Negative.  Negative for chest pain and leg swelling.  Gastrointestinal: Negative.  Negative for abdominal pain, blood in stool and melena.  Genitourinary: Negative.   Musculoskeletal: Negative.   Skin: Negative.  Negative for rash.  Neurological: Positive for weakness. Negative for sensory change.  Psychiatric/Behavioral: Negative.  The patient is not nervous/anxious.     As per HPI. Otherwise, a complete review of systems is negative.  PAST MEDICAL HISTORY: Past Medical History:  Diagnosis Date  . Anemia   . Arthritis   . Complication of anesthesia   . PONV (postoperative nausea and vomiting)     PAST SURGICAL HISTORY: Past Surgical History:  Procedure Laterality Date  . AXILLARY LYMPH NODE DISSECTION Left 06/26/2017   Procedure: EXCISION OF LEFT AXILLARY MASS;  Surgeon: Johnathan Hausen, MD;  Location: [facility] ORS;  Service: General;  Laterality: Left;  . LAPAROTOMY N/A 10/14/2017   Procedure: EXPLORATORY LAPAROTOMY;  Surgeon: Clayburn Pert, MD;  Location: [facility] ORS;  Service: General;  Laterality: N/A;  . VASECTOMY      FAMILY HISTORY: Family History  Problem Relation Age of Onset  . Diabetes Mother   . Hypertension Mother   . Leukemia Father   . Thyroid disease Sister   . Liver disease Maternal Grandmother   . Heart Problems Maternal Grandfather   . Diabetes Maternal Grandfather   . Lung cancer Paternal Grandmother   . Prostate cancer Paternal Grandfather     ADVANCED DIRECTIVES (Y/N):  N  HEALTH MAINTENANCE: Social History   Tobacco Use  . Smoking status: Never Smoker  . Smokeless tobacco: Never Used  Substance Use Topics  . Alcohol use: No  . Drug use: No     Colonoscopy:  PAP:  Bone density:  Lipid panel:  No Known Allergies  Current Outpatient Medications  Medication Sig Dispense Refill  . Multiple Vitamin (MULTIVITAMIN) tablet Take 1 tablet by mouth daily.     No current facility-administered medications for this visit.     OBJECTIVE: Vitals:   11/03/17 1102  BP: 113/72  Pulse: 63  Resp: 18  Temp: (!) 96.3 F (35.7 C)     Body mass index is 26.35 kg/m.    ECOG FS:0 - Asymptomatic  General: Well-developed, well-nourished, no acute distress. Eyes: Pink conjunctiva, anicteric sclera. Lungs: Clear to auscultation bilaterally. Heart: Regular rate and rhythm. No rubs, murmurs, or gallops. Abdomen: Soft, nontender, nondistended. No organomegaly noted, normoactive bowel sounds. Musculoskeletal: No edema, cyanosis,  or clubbing. Neuro: Alert, answering all questions appropriately. Cranial nerves grossly intact. Skin: No rashes or petechiae noted. Psych: Normal affect.   LAB RESULTS:  Lab Results  Component Value Date   NA 135 10/26/2017   K 3.1 (L) 10/26/2017   CL 96 (L) 10/26/2017   CO2 30 10/26/2017   GLUCOSE 117 (H)  10/26/2017   BUN 25 (H) 10/26/2017   CREATININE 1.09 10/26/2017   CALCIUM 8.4 (L) 10/26/2017   PROT 6.3 (L) 10/26/2017   ALBUMIN 2.8 (L) 10/26/2017   AST 29 10/26/2017   ALT 31 10/26/2017   ALKPHOS 67 10/26/2017   BILITOT 0.6 10/26/2017   GFRNONAA >60 10/26/2017   GFRAA >60 10/26/2017    Lab Results  Component Value Date   WBC 5.9 10/26/2017   NEUTROABS 4.2 10/26/2017   HGB 11.8 (L) 10/26/2017   HCT 35.0 (L) 10/26/2017   MCV 77.0 (L) 10/26/2017   PLT 276 10/26/2017     STUDIES: Dg Abd 1 View  Result Date: 10/18/2017 CLINICAL DATA:  Nausea vomiting.  Laparotomy 10/14/2017 EXAM: ABDOMEN - 1 VIEW COMPARISON:  CT 10/14/2017 FINDINGS: NG tip in the body the stomach Mildly distended large and small bowel loops in the abdomen may reflect postop ileus versus obstruction. Pelvis not included on the study. No free air on this upright image. IMPRESSION: NG tube in the mid stomach Mildly Distended large and small bowel loops, probable postop ileus. Electronically Signed   By: Franchot Gallo M.D.   On: 10/18/2017 20:26   Dg Abd 1 View  Result Date: 10/14/2017 CLINICAL DATA:  67 year old male with a history of abdominal distention/small bowel obstruction EXAM: ABDOMEN - 1 VIEW COMPARISON:  10/13/2017, 10/12/2017, 10/10/2017, PET-CT 10/09/2017 FINDINGS: Persisting distension of small bowel and colonic bowel loops. Upper abdomen not imaged. No radiopaque foreign bodies. No acute bony abnormality. IMPRESSION: Persisting bowel obstruction. If there is concern for acute intraabdominal process, recommend contrast-enhanced CT. Electronically Signed   By: Corrie Mckusick D.O.   On: 10/14/2017 07:50   Dg Abd 1 View  Result Date: 10/13/2017 CLINICAL DATA:  Ileus EXAM: ABDOMEN - 1 VIEW COMPARISON:  10/12/2017 FINDINGS: Gaseous distension of large and small bowel again noted. No free air organomegaly. No acute bony abnormality. IMPRESSION: Stable gaseous distention of large and small bowel, most  compatible with ileus. Electronically Signed   By: Rolm Baptise M.D.   On: 10/13/2017 07:37   Dg Abd 1 View  Result Date: 10/12/2017 CLINICAL DATA:  Evaluate ileus. EXAM: ABDOMEN - 1 VIEW COMPARISON:  Abdominal radiograph performed 10/10/2017 FINDINGS: There is diffuse distention of small and large bowel loops with air, with small-bowel loops measuring up to 7.0 cm in diameter. This is suspicious for worsening ileus. The patient's enteric tube is seen ending overlying the body of the stomach. No free intra-abdominal air is seen, though evaluation for free air is limited on supine views. No acute osseous abnormalities are identified. IMPRESSION: Increasing distention of small and large bowel loops with air, with small bowel loops measuring up to 7.0 cm in diameter. This is suspicious for worsening ileus. No definite evidence for bowel obstruction. No free intra-abdominal air seen. Electronically Signed   By: Garald Balding M.D.   On: 10/12/2017 01:32   Dg Abdomen 1 View  Result Date: 10/10/2017 CLINICAL DATA:  67 year old male status post NG tube placement EXAM: ABDOMEN - 1 VIEW COMPARISON:  Prior abdominal radiograph 10/10/2017 FINDINGS: Interval placement of a gastric tube. The tube is coiled in  the stomach. Slightly improved gaseous distension of the small bowel. Persisting gaseous distension of the colon. Large colonic stool burden. IMPRESSION: Newly placed nasogastric tube is coiled in the stomach. Electronically Signed   By: Jacqulynn Cadet M.D.   On: 10/10/2017 17:10   Dg Abd 1 View  Result Date: 10/10/2017 CLINICAL DATA:  67 year old presenting with no bowel movement in the past 6 days and acute onset of vomiting that began last night, associated with abdominal distention. Patient currently undergoing immunotherapy for metastatic melanoma. EXAM: ABDOMEN - 1 VIEW COMPARISON:  PET-CT yesterday and 07/15/2017. FINDINGS: Multiple dilated loops of small bowel in the upper abdomen as noted on the  PET-CT yesterday. Very large colonic stool burden. Gas and stool throughout the upper normal caliber colon from cecum to rectum. No suggestion of free air on the supine image. IMPRESSION: 1. Stable small bowel distention since yesterday's PET-CT, ileus versus early partial small bowel obstruction. Serial follow-up abdominal x-rays may be helpful. 2. Very large colonic stool burden. Electronically Signed   By: Evangeline Dakin M.D.   On: 10/10/2017 11:50   Ct Abdomen Pelvis W Contrast  Result Date: 10/14/2017 CLINICAL DATA:  Acute generalized abdominal pain and distention. EXAM: CT ABDOMEN AND PELVIS WITH CONTRAST TECHNIQUE: Multidetector CT imaging of the abdomen and pelvis was performed using the standard protocol following bolus administration of intravenous contrast. CONTRAST:  30m ISOVUE-370 IOPAMIDOL (ISOVUE-370) INJECTION 76% COMPARISON:  PET scan of October 09, 2017. Radiographs of same day. FINDINGS: Lower chest: Mild bilateral posterior basilar subsegmental atelectasis. Hepatobiliary: No gallstones are noted. Multiple small low densities are noted which are stable compared to prior exam and most consistent with hepatic cysts, although metastatic disease cannot be excluded given the history of melanoma. Pancreas: Unremarkable. No pancreatic ductal dilatation or surrounding inflammatory changes. Spleen: Normal in size without focal abnormality. Adrenals/Urinary Tract: Adrenal glands are unremarkable. Kidneys are normal, without renal calculi, focal lesion, or hydronephrosis. Bladder is unremarkable. Stomach/Bowel: The stomach appears normal. There is significantly increased small bowel dilatation concerning for distal small bowel obstruction. No colonic dilatation is noted. Stool is noted in the colon. Pneumatosis is seen involving a small bowel loop in the pelvis best seen on image number 73 of series 2 concerning for ischemic bowel. Vascular/Lymphatic: No significant vascular findings are present. No  enlarged abdominal or pelvic lymph nodes. Reproductive: Prostate is unremarkable. Other: Pneumoperitoneum is noted in the epigastric region concerning for rupture of hollow viscus. No abnormal fluid collection is noted. Musculoskeletal: No acute or significant osseous findings. IMPRESSION: Significantly increased small bowel dilatation is now noted concerning for distal small bowel obstruction. There is also noted pneumatosis involving dilated small bowel loop in the pelvis concerning for ischemic bowel. Pneumoperitoneum is noted in epigastric region as well concerning for rupture of hollow viscus. Immediate surgical consultation is recommended. Critical Value/emergent results were called by telephone at the time of interpretation on 10/14/2017 at 2:30 pm to Dr. RLoletha Grayer, who verbally acknowledged these results. Multiple small low densities are noted within the hepatic parenchyma which may simply represent cysts, but metastatic disease cannot be excluded given the history of melanoma. Electronically Signed   By: JMarijo Conception M.D.   On: 10/14/2017 14:32   Nm Pet Image Restage (ps) Whole Body  Result Date: 10/09/2017 CLINICAL DATA:  Subsequent treatment strategy for cutaneous melanoma. Immunotherapy December 2018. EXAM: NUCLEAR MEDICINE PET WHOLE BODY TECHNIQUE: 12.6 mCi F-18 FDG was injected intravenously. Full-ring PET imaging was performed from the vertex  to the feet after the radiotracer. CT data was obtained and used for attenuation correction and anatomic localization. FASTING BLOOD GLUCOSE:  Value: 83 mg/dl COMPARISON:  07/15/2017 FINDINGS: HEAD/NECK No hypermetabolic activity in the scalp. No hypermetabolic cervical lymph nodes. Right maxillary molar periapical lucency with adjacent mucoperiosteal thickening in the right maxillary sinus. CHEST Dominant left axillary mass measures 9.1 by 5.8 cm on image 117/4 with maximum SUV 21.4 (formerly 16.5). The smaller adjacent lymph nodes demonstrate  only very weak metabolic activity. There is a fluid density collection extending from the center of the axillary process towards the skin surface. A right supraclavicular node measuring 1.1 cm in short axis on image 91/4 previously measured 0.6 cm and currently has a maximum SUV of 3.5 (formerly not appreciably metabolic). Hypermetabolic right paratracheal, subcarinal, bilateral hilar, and bilateral infrahilar lymph nodes are present. A index subcarinal node 1.2 cm in short axis on image 123/4 (formerly 0.6 cm) with maximum SUV 6.7 (formerly 4.0). Scarring in both lower lobes, with a bandlike nodular region of volume loss in the left lower lobe on image 147/4 with low-grade associated metabolic activity, probably a all from atelectasis. Mild bilateral airway thickening. ABDOMEN/PELVIS No abnormal hypermetabolic activity within the liver, pancreas, adrenal glands, or spleen. No hypermetabolic lymph nodes in the abdomen or pelvis. A small hypodense lesion in the vicinity of the falciform ligament is not appreciably hypermetabolic and appear stable from prior, possibly focal fatty infiltration of the liver. Dependent density in the gallbladder favor sludge. Rectal air- fluid level favoring diarrheal process. Borderline dilated loops of pelvic small bowel without a well-defined transition point. Prior accentuated activity in the anorectal junction has resolved. SKELETON Expansile left iliac bone lesion with similar size and morphology compared the prior exam, 2.0 cm in long axis, maximum SUV 2.5 (previously 3.1). 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 EXTREMITIES No abnormal hypermetabolic activity in the lower extremities. IMPRESSION: 1. The dominant left axillary mass is essentially stable in size by my measurements, and has a maximum SUV of 21.4 (formerly 16.5). Also the mediastinal and hilar/infrahilar lymph nodes in the chest demonstrate mild enlargement and moderate increased in standard uptake value. Possibilities  include mild progression of versus immunotherapy-related pseudoprogression, and surveillance is recommended. As before, left axillary mass has a cutaneous extension inferiorly and may possibly be draining to the skin. 2. Roughly stable appearance of the expansile left iliac bone lesion, with low-grade metabolic activity. 3. No additional involvement identified. 4. Borderline dilated small bowel, significance uncertain. No definite hypermetabolic activity within or along the small bowel to further suggest metastatic disease to the bowel. There is also an air- fluid level in the rectum compatible with diarrheal process. 5. Increased atelectasis along the left lung base. 6. Other imaging findings of potential clinical significance: Periapical lucency along a right maxillary molar. 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 Airway thickening is present, suggesting bronchitis or reactive airways disease. Electronically Signed   By: Van Clines M.D.   On: 10/09/2017 15:07   Dg Chest Port 1 View  Result Date: 10/15/2017 CLINICAL DATA:  Confirm line placement. EXAM: PORTABLE CHEST 1 VIEW COMPARISON:  No recent chest x-ray for review. FINDINGS: The lungs are mildly hypoinflated but clear. The heart is normal in size. The pulmonary vascularity is not engorged. A right-sided PICC line has its tip terminating over the midportion of the SVC. There is an esophagogastric tube present whose tip in proximal port project below the GE junction. IMPRESSION: Positioning of the right-sided PICC line and the  esophagogastric tube as described. Mild hypoinflation. No definite acute cardiopulmonary abnormality. Electronically Signed   By: David  Martinique M.D.   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  PLAN:    1. 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  Because patient's BRAF mutation is positive, will proceed with 960 mg Zelboraf daily.  Patient had a baseline EKG today.  Return to clinic at the end of February for repeat laboratory work, further evaluation, and to assess her toleration of treatment.  Plan to reimage in in approximately April 2019. 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 3.  Weight loss: Improving.  Approximately 30 minutes was spent in discussion of which greater than 50% was consultation.  Patient expressed understanding and was in agreement with this plan. He also understands that He can call clinic at any time with any questions, concerns, or complaints.   Cancer Staging Metastatic melanoma [facility]) Staging form: Melanoma of the Skin, AJCC 8th Edition - Clinical stage from 07/15/2017: Stage IV (cTX, cN1, cM1c(0)) - Signed by Lloyd Huger, MD on 07/15/2017   Lloyd Huger, MD   11/07/2017 6:39 AM

## 2017-11-02 ENCOUNTER — Encounter: Payer: Self-pay | Admitting: Surgery

## 2017-11-03 ENCOUNTER — Inpatient Hospital Stay: Payer: Medicare Other | Attending: Oncology | Admitting: Oncology

## 2017-11-03 ENCOUNTER — Telehealth: Payer: Self-pay | Admitting: Oncology

## 2017-11-03 ENCOUNTER — Telehealth: Payer: Self-pay | Admitting: Pharmacist

## 2017-11-03 ENCOUNTER — Other Ambulatory Visit: Payer: Self-pay

## 2017-11-03 VITALS — BP 113/72 | HR 63 | Temp 96.3°F | Resp 18 | Wt 188.9 lb

## 2017-11-03 DIAGNOSIS — Z8042 Family history of malignant neoplasm of prostate: Secondary | ICD-10-CM | POA: Insufficient documentation

## 2017-11-03 DIAGNOSIS — C7951 Secondary malignant neoplasm of bone: Secondary | ICD-10-CM | POA: Diagnosis present

## 2017-11-03 DIAGNOSIS — M170 Bilateral primary osteoarthritis of knee: Secondary | ICD-10-CM | POA: Insufficient documentation

## 2017-11-03 DIAGNOSIS — R112 Nausea with vomiting, unspecified: Secondary | ICD-10-CM | POA: Insufficient documentation

## 2017-11-03 DIAGNOSIS — Z79899 Other long term (current) drug therapy: Secondary | ICD-10-CM

## 2017-11-03 DIAGNOSIS — C439 Malignant melanoma of skin, unspecified: Secondary | ICD-10-CM | POA: Diagnosis not present

## 2017-11-03 DIAGNOSIS — M129 Arthropathy, unspecified: Secondary | ICD-10-CM | POA: Insufficient documentation

## 2017-11-03 DIAGNOSIS — R109 Unspecified abdominal pain: Secondary | ICD-10-CM | POA: Diagnosis not present

## 2017-11-03 DIAGNOSIS — D649 Anemia, unspecified: Secondary | ICD-10-CM | POA: Diagnosis not present

## 2017-11-03 DIAGNOSIS — R630 Anorexia: Secondary | ICD-10-CM | POA: Diagnosis not present

## 2017-11-03 DIAGNOSIS — Z801 Family history of malignant neoplasm of trachea, bronchus and lung: Secondary | ICD-10-CM | POA: Insufficient documentation

## 2017-11-03 DIAGNOSIS — R531 Weakness: Secondary | ICD-10-CM | POA: Insufficient documentation

## 2017-11-03 DIAGNOSIS — R5383 Other fatigue: Secondary | ICD-10-CM | POA: Diagnosis not present

## 2017-11-03 DIAGNOSIS — R634 Abnormal weight loss: Secondary | ICD-10-CM | POA: Insufficient documentation

## 2017-11-03 DIAGNOSIS — K631 Perforation of intestine (nontraumatic): Secondary | ICD-10-CM | POA: Diagnosis not present

## 2017-11-03 DIAGNOSIS — C799 Secondary malignant neoplasm of unspecified site: Secondary | ICD-10-CM

## 2017-11-03 MED ORDER — VEMURAFENIB 240 MG PO TABS: 960.0000 mg | ORAL_TABLET | Freq: Two times a day (BID) | ORAL | 5 refills | Status: DC

## 2017-11-03 NOTE — Telephone Encounter (Signed)
Oral Oncology Patient Advocate Encounter  Was successful in securing patient a year grant from [employer] to provide copayment coverage for his [place].  This will keep the out of pocket expense at $0.    I have spoken with the patient.    The billing information is as follows and has been shared with [place].   Member ID: [id number] Group ID: [id number] RxBin: [id number] PCN: [id number] Dates of Eligibility: 11/03/2017 through 11/03/2018   [place] Patient Advocate [phone] 11/03/2017 4:37 PM

## 2017-11-03 NOTE — Telephone Encounter (Addendum)
Oral Oncology Pharmacist Encounter  Received new prescription for Zelboraf (vemurafenib) for the treatment of metastatic melanoma, planned duration until disease progression or unacceptable drug toxicity.  CBC/CMP/Mag from 10/26/17 and Phos from 10/25/17 assessed, no relevant lab abnormalities. Prescription dose and frequency assessed. Baseline ECG was completed at today's visit. QTc wnl at 418.   Current medication list in Epic reviewed, updated the list with OTC products reported by Mr. Boal's wife. No DDIs with Zelboraf identified.  Prescription has been e-scribed to the [facility] for benefits analysis and approval.  Of note, the patient will have new insurance beginning on 11/20/17. Asked that he call us with his new prescription insurance information when it is available to him.   Patient education Counseled patient and his wife on administration, dosing, side effects, monitoring, drug-food interactions, safe handling, storage, and disposal. Patient will take 4 tablets (960 mg total) by mouth every 12 (twelve) hours. Take with water.  Side effects include but not limited to: alopecia, N/V, rash/itching, sunburn, fatigue, muscle pain.    Reviewed with patient importance of keeping a medication schedule and plan for any missed doses.  Mr. Darroch and his wife voiced understanding and appreciation. All questions answered.  Oral Oncology Clinic will continue to follow for insurance authorization, copayment issues, and start date.  Provided patient with [facility] [place] [facility] phone number. Patient knows to call the office with questions or concerns. Oral Chemotherapy Navigation Clinic will continue to follow.  Darl Pikes, PharmD, BCPS Hematology/Oncology Clinical Pharmacist [facility] [place] [facility] [phone]  11/03/2017 11:38 AM

## 2017-11-03 NOTE — Telephone Encounter (Signed)
Oral Oncology Patient Advocate Encounter  Received notification from [employer] that prior authorization for Louis Davies is required.  PA submitted on CoverMyMeds Key [id number] Status is pending  Oral Oncology Clinic will continue to follow.   Louis Davies Patient Advocate [phone] 11/03/2017 3:23 PM    Oral Oncology Patient Advocate Encounter  Prior Authorization for Louis Davies has been approved.    PA# [id number] Effective dates: 11/03/2017 through 11/03/2018.  Oral Oncology Clinic will continue to follow.    [facility] Patient Advocate [phone] 11/03/2017 3:25 PM

## 2017-11-04 ENCOUNTER — Ambulatory Visit (INDEPENDENT_AMBULATORY_CARE_PROVIDER_SITE_OTHER): Payer: Medicare Other | Admitting: General Surgery

## 2017-11-04 ENCOUNTER — Encounter: Payer: Self-pay | Admitting: General Surgery

## 2017-11-04 VITALS — BP 116/67 | HR 69 | Temp 97.6°F | Wt 187.0 lb

## 2017-11-04 DIAGNOSIS — Z4889 Encounter for other specified surgical aftercare: Secondary | ICD-10-CM

## 2017-11-04 MED FILL — ZELBORAF 240 MG TABS: 240 | 28 days supply | Qty: 224 | Fill #0

## 2017-11-04 NOTE — Progress Notes (Signed)
Outpatient Surgical Follow Up  11/04/2017  Louis Davies is an 67 y.o. male.   Chief Complaint  Patient presents with  . Routine Post Op    post op: EXPLORATORY LAPAROTOMY 10/14/17 Dr. Adonis Huguenin    HPI: 67 year old male with multiple medical problems returns to clinic for follow-up now 2-1/2 weeks status post exploratory laparotomy.  Laparotomy was performed for peritonitis and image findings concerning for perforated bowel.  There were no findings of perforation at the time of the laparotomy.  He reports doing very well.  He denies any pain.  He is eating well and having bowel function.  He states his appetite is poor but he is able to eat.  He denies any fevers, chills, nausea, vomiting, chest pain, shortness of breath, diarrhea, constipation.  He does continue to have the area on his left axilla and has a known history of metastatic melanoma.  Past Medical History:  Diagnosis Date  . Anemia   . Arthritis   . Complication of anesthesia   . PONV (postoperative nausea and vomiting)     Past Surgical History:  Procedure Laterality Date  . AXILLARY LYMPH NODE DISSECTION Left 06/26/2017   Procedure: EXCISION OF LEFT AXILLARY MASS;  Surgeon: Johnathan Hausen, MD;  Location: [facility] ORS;  Service: General;  Laterality: Left;  . LAPAROTOMY N/A 10/14/2017   Procedure: EXPLORATORY LAPAROTOMY;  Surgeon: Clayburn Pert, MD;  Location: [facility] ORS;  Service: General;  Laterality: N/A;  . VASECTOMY      Family History  Problem Relation Age of Onset  . Diabetes Mother   . Hypertension Mother   . Leukemia Father   . Thyroid disease Sister   . Liver disease Maternal Grandmother   . Heart Problems Maternal Grandfather   . Diabetes Maternal Grandfather   . Lung cancer Paternal Grandmother   . Prostate cancer Paternal Grandfather     Social History:  reports that  has never smoked. he has never used smokeless tobacco. He reports that he does not drink alcohol or use drugs.  Allergies: No Known  Allergies  Medications reviewed.    ROS A multipoint review of systems was completed, all pertinent positives and negatives are documented within the HPI and the remainder are negative   BP 116/67   Pulse 69   Temp 97.6 F (36.4 C) (Oral)   Wt 84.8 kg (187 lb)   BMI 26.08 kg/m   Physical Exam  General: No acute distress Chest: Clear to auscultation Heart: Regular rate and rhythm Abdomen: Soft and nontender.  Well approximated midline incision with staples still in place.  No evidence of erythema or drainage.   No results found for this or any previous visit (from the past 48 hour(s)). No results found.  Assessment/Plan:  1. Aftercare following surgery 67 year old male status post exploratory laparotomy.  Doing very well.  Staples removed today and replaced with Steri-Strips.  He will follow-up in clinic in a week and a half for a repeat wound check and to then evaluate whether or not surgery needs performed for the area of his left axilla.     Clayburn Pert, MD FACS General Surgeon  11/04/2017,10:53 AM

## 2017-11-04 NOTE — Patient Instructions (Signed)

## 2017-11-04 NOTE — Telephone Encounter (Signed)
Oral Oncology Patient Advocate Encounter  Sent e-mail to [facility] to please mail patients medication. Will need to be ordered.   [place] Patient Advocate [phone] 11/04/2017 11:50 AM

## 2017-11-18 ENCOUNTER — Telehealth: Payer: Self-pay | Admitting: *Deleted

## 2017-11-18 ENCOUNTER — Ambulatory Visit (INDEPENDENT_AMBULATORY_CARE_PROVIDER_SITE_OTHER): Payer: Medicare Other | Admitting: General Surgery

## 2017-11-18 ENCOUNTER — Inpatient Hospital Stay: Payer: Medicare Other | Admitting: Oncology

## 2017-11-18 ENCOUNTER — Encounter: Payer: Self-pay | Admitting: General Surgery

## 2017-11-18 VITALS — BP 114/74 | HR 79 | Temp 97.5°F | Ht 71.0 in | Wt 181.2 lb

## 2017-11-18 VITALS — BP 114/74 | HR 79 | Temp 97.5°F | Resp 18 | Wt 181.2 lb

## 2017-11-18 DIAGNOSIS — Z4889 Encounter for other specified surgical aftercare: Secondary | ICD-10-CM

## 2017-11-18 DIAGNOSIS — R630 Anorexia: Secondary | ICD-10-CM | POA: Diagnosis not present

## 2017-11-18 DIAGNOSIS — R531 Weakness: Secondary | ICD-10-CM

## 2017-11-18 DIAGNOSIS — R5383 Other fatigue: Secondary | ICD-10-CM | POA: Diagnosis not present

## 2017-11-18 DIAGNOSIS — M170 Bilateral primary osteoarthritis of knee: Secondary | ICD-10-CM | POA: Diagnosis not present

## 2017-11-18 DIAGNOSIS — M129 Arthropathy, unspecified: Secondary | ICD-10-CM | POA: Diagnosis not present

## 2017-11-18 DIAGNOSIS — C439 Malignant melanoma of skin, unspecified: Secondary | ICD-10-CM | POA: Diagnosis not present

## 2017-11-18 DIAGNOSIS — K631 Perforation of intestine (nontraumatic): Secondary | ICD-10-CM

## 2017-11-18 DIAGNOSIS — R112 Nausea with vomiting, unspecified: Secondary | ICD-10-CM

## 2017-11-18 DIAGNOSIS — R634 Abnormal weight loss: Secondary | ICD-10-CM | POA: Diagnosis not present

## 2017-11-18 DIAGNOSIS — C7951 Secondary malignant neoplasm of bone: Secondary | ICD-10-CM | POA: Diagnosis not present

## 2017-11-18 DIAGNOSIS — Z801 Family history of malignant neoplasm of trachea, bronchus and lung: Secondary | ICD-10-CM

## 2017-11-18 DIAGNOSIS — R109 Unspecified abdominal pain: Secondary | ICD-10-CM | POA: Diagnosis not present

## 2017-11-18 DIAGNOSIS — C799 Secondary malignant neoplasm of unspecified site: Secondary | ICD-10-CM

## 2017-11-18 DIAGNOSIS — Z8041 Family history of malignant neoplasm of ovary: Secondary | ICD-10-CM

## 2017-11-18 DIAGNOSIS — D649 Anemia, unspecified: Secondary | ICD-10-CM

## 2017-11-18 MED ORDER — MEGESTROL ACETATE 40 MG PO TABS: 40.0000 mg | ORAL_TABLET | Freq: Every day | ORAL | 0 refills | Status: DC

## 2017-11-18 NOTE — Telephone Encounter (Signed)
Patient reports that he has left a message somewhere this morning and would like a return call. He has an appointment with Dr Adonis Huguenin this afternoon and said he would like to come here to discuss he feelings of not wanting to start chemotherapy yet.

## 2017-11-18 NOTE — Telephone Encounter (Signed)
Appointment accepted for this afternoon per VO [name] and Tillie Rung, RN

## 2017-11-18 NOTE — Progress Notes (Signed)
Outpatient Surgical Follow Up  11/18/2017  Louis Davies is an 67 y.o. male.   Chief Complaint  Patient presents with  . Routine Post Op    post op: EXPLORATORY LAPAROTOMY 10/14/17 Dr. Adonis Huguenin    HPI: 67 year old male returns to clinic now 1 month status post exploratory laparotomy.  Patient reports doing well from an abdominal standpoint with the exception of a lack of appetite.  He denies any fevers, chills, nausea, vomiting, chest pain, shortness of breath, diarrhea, constipation.  He is drinking well he is just not eating very much.  He continues to have an area to his left axilla that is swollen but not tender and has not been changing in size or character.  Past Medical History:  Diagnosis Date  . Anemia   . Arthritis   . Complication of anesthesia   . PONV (postoperative nausea and vomiting)     Past Surgical History:  Procedure Laterality Date  . AXILLARY LYMPH NODE DISSECTION Left 06/26/2017   Procedure: EXCISION OF LEFT AXILLARY MASS;  Surgeon: Johnathan Hausen, MD;  Location: [facility] ORS;  Service: General;  Laterality: Left;  . LAPAROTOMY N/A 10/14/2017   Procedure: EXPLORATORY LAPAROTOMY;  Surgeon: Clayburn Pert, MD;  Location: [facility] ORS;  Service: General;  Laterality: N/A;  . VASECTOMY      Family History  Problem Relation Age of Onset  . Diabetes Mother   . Hypertension Mother   . Leukemia Father   . Thyroid disease Sister   . Liver disease Maternal Grandmother   . Heart Problems Maternal Grandfather   . Diabetes Maternal Grandfather   . Lung cancer Paternal Grandmother   . Prostate cancer Paternal Grandfather     Social History:  reports that  has never smoked. he has never used smokeless tobacco. He reports that he does not drink alcohol or use drugs.  Allergies: No Known Allergies  Medications reviewed.    ROS A multipoint review of systems was completed, all pertinent positives and negatives are documented within the HPI and the remainder are  negative   BP 114/74   Pulse 79   Temp (!) 97.5 F (36.4 C) (Oral)   Ht 5\' 11"  (1.803 m)   Wt 82.2 kg (181 lb 3.2 oz)   BMI 25.27 kg/m   Physical Exam General: No acute distress Chest: Clear to auscultation Heart: Regular rate and rhythm Abdomen: Soft, nontender, nondistended.  Well approximated and healed midline incision without evidence of erythema or drainage. Skin: Left axilla with a obviously swollen area at his left axillary incision site with palpable fluid consistent with seroma versus lymph    No results found for this or any previous visit (from the past 86 hour(s)). No results found.  Assessment/Plan:  1. 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  Underwent exploratory laparotomy 1 month ago.  Midline wound is healing well.  Stable appearing left axilla.  Discussed that we would attempt to leave the axilla alone as it is not changing in size and does not appear to be infected.  Provided with standard postoperative precautions.  He will follow-up on an as-needed basis.  I will send a note to his operative surgeon from his axillary dissection from this note.     Clayburn Pert, MD FACS General Surgeon  11/18/2017,2:06 PM

## 2017-11-18 NOTE — Patient Instructions (Signed)
Axilla Wound Signs of infection:  If the wound becomes bright red and painful or starts to drain infected material that is not clear, please contact your physician immediately.     QUESTIONS:  Please feel free to call our office if you have any questions, and we will be glad to assist you. [phone]

## 2017-11-19 NOTE — Progress Notes (Signed)
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  INTERVAL HISTORY: Patient returns to clinic today as an add-on today with poor appetite, abdominal bloating, and increased nausea and vomiting.  He does not complain of abdominal pain.  He continues to lose weight.  He has no neurologic complaints. He denies any recent fevers. He has no chest pain, cough, or hemoptysis. He denies any constipation or diarrhea.. He has no melena or hematochezia. He has no urinary complaints. Patient offers no further specific complaints today.  REVIEW OF SYSTEMS:   Review of Systems  Constitutional: Positive for malaise/fatigue and weight loss. Negative for fever.  Respiratory: Negative.  Negative for cough and shortness of breath.   Cardiovascular: Negative.  Negative for chest pain and leg swelling.  Gastrointestinal: Positive for nausea and vomiting. Negative for abdominal pain, blood in stool and melena.  Genitourinary: Negative.   Musculoskeletal: Negative.   Skin: Negative.  Negative for rash.  Neurological: Positive for weakness. Negative for sensory change.  Psychiatric/Behavioral: Negative.  The patient is not nervous/anxious.     As per HPI. Otherwise, a complete review of systems is negative.  PAST MEDICAL HISTORY: Past Medical History:  Diagnosis Date  . Anemia   . Arthritis   . Complication of anesthesia   . PONV (postoperative nausea and vomiting)     PAST SURGICAL HISTORY: Past Surgical History:  Procedure Laterality Date  . AXILLARY LYMPH NODE DISSECTION Left 06/26/2017   Procedure: EXCISION OF LEFT AXILLARY MASS;  Surgeon: Johnathan Hausen, MD;  Location: [facility] OR[facility];  Service: General;  Laterality: Left;  . LAPAROTOMY N/A 10/14/2017   Procedure: EXPLORATORY LAPAROTOMY;  Surgeon: Clayburn Pert, MD;  Location: [facility] OR[facility];  Service: General;  Laterality: N/A;  . VASECTOMY      FAMILY HISTORY: Family History  Problem Relation Age of Onset  . Diabetes Mother   . Hypertension Mother   . Leukemia Father   . Thyroid disease Sister   . Liver disease Maternal Grandmother   . Heart Problems Maternal Grandfather   . Diabetes Maternal Grandfather   . Lung cancer Paternal Grandmother   . Prostate cancer Paternal Grandfather     ADVANCED DIRECTIVES (Y/N):  N  HEALTH MAINTENANCE: Social History   Tobacco Use  . Smoking status: Never Smoker  . Smokeless tobacco: Never Used  Substance Use Topics  . Alcohol use: No  . Drug use: No     Colonoscopy:  PAP:  Bone density:  Lipid panel:  No Known Allergies  Current Outpatient Medications  Medication Sig Dispense Refill  . Multiple Vitamin (MULTIVITAMIN) tablet Take 1 tablet by mouth daily.    . megestrol (MEGACE) 40 MG tablet Take 1 tablet (40 mg total) by mouth daily. 30 tablet 0  . Polyethylene Glycol 3350 (PEG 3350) POWD Take by mouth.    . senna-docusate (SENOKOT-S) 8.6-50 MG tablet Take by mouth.    . vemurafenib (ZELBORAF) 240 MG tablet     . ZELBORAF 240 MG tablet   5   No current facility-administered medications for this visit.     OBJECTIVE: Vitals:   11/18/17 1431  BP: 114/74  Pulse: 79  Resp: 18  Temp: (!) 97.5 F (36.4 C)     Body mass index is 25.27 kg/m.    ECOG  FS:0 - Asymptomatic  General: Thin, no acute distress. Eyes: Pink conjunctiva, anicteric sclera. Lungs: Clear to auscultation bilaterally. Heart: Regular rate and rhythm. No rubs, murmurs, or gallops. Abdomen: Soft, nontender, nondistended. No organomegaly noted, normoactive bowel sounds. Musculoskeletal: No edema, cyanosis, or clubbing. Neuro: Alert, answering all questions appropriately. Cranial nerves grossly intact. Skin: No rashes or petechiae noted. Psych: Normal  affect.   LAB RESULTS:  Lab Results  Component Value Date   NA 135 10/26/2017   K 3.1 (L) 10/26/2017   CL 96 (L) 10/26/2017   CO2 30 10/26/2017   GLUCOSE 117 (H) 10/26/2017   BUN 25 (H) 10/26/2017   CREATININE 1.09 10/26/2017   CALCIUM 8.4 (L) 10/26/2017   PROT 6.3 (L) 10/26/2017   ALBUMIN 2.8 (L) 10/26/2017   AST 29 10/26/2017   ALT 31 10/26/2017   ALKPHOS 67 10/26/2017   BILITOT 0.6 10/26/2017   GFRNONAA >60 10/26/2017   GFRAA >60 10/26/2017    Lab Results  Component Value Date   WBC 5.9 10/26/2017   NEUTROABS 4.2 10/26/2017   HGB 11.8 (L) 10/26/2017   HCT 35.0 (L) 10/26/2017   MCV 77.0 (L) 10/26/2017   PLT 276 10/26/2017     STUDIES: No results found.  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  PLAN:    1. 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  Because patient's BRAF mutation is positive, will proceed with 960 mg Zelboraf daily.  Patient has not initiated treatment and has been instructed to hold until his acute symptoms resolved.  Return to clinic in 2 weeks for further evaluation and consideration of initiating treatment.   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 3.  Poor appetite: Patient was given a prescription for Megace and a referral to dietary. 4.  Abdominal bloating: Will get CT scan of the abdomen and pelvis for further evaluation. 5.  Intractable nausea: Continue current medication as prescribed.  CT scan as above.  Approximately 30 minutes was spent in discussion of which greater than 50% was consultation.  Patient expressed understanding and was in agreement with this plan. He also understands that He can call clinic at any time with any questions, concerns, or  complaints.   Cancer Staging Metastatic melanoma [facility]) Staging form: Melanoma of the Skin, AJCC 8th Edition - Clinical stage from 07/15/2017: Stage IV (cTX, cN1, cM1c(0)) - Signed by Lloyd Huger, MD on 07/15/2017   Lloyd Huger, MD   11/19/2017 10:35 AM

## 2017-11-23 ENCOUNTER — Inpatient Hospital Stay: Payer: Medicare Other | Attending: Oncology

## 2017-11-23 DIAGNOSIS — D649 Anemia, unspecified: Secondary | ICD-10-CM | POA: Insufficient documentation

## 2017-11-23 DIAGNOSIS — C439 Malignant melanoma of skin, unspecified: Secondary | ICD-10-CM | POA: Insufficient documentation

## 2017-11-23 DIAGNOSIS — C7951 Secondary malignant neoplasm of bone: Secondary | ICD-10-CM | POA: Insufficient documentation

## 2017-11-23 DIAGNOSIS — C781 Secondary malignant neoplasm of mediastinum: Secondary | ICD-10-CM | POA: Insufficient documentation

## 2017-11-23 DIAGNOSIS — M129 Arthropathy, unspecified: Secondary | ICD-10-CM | POA: Insufficient documentation

## 2017-11-23 DIAGNOSIS — Z1501 Genetic susceptibility to malignant neoplasm of breast: Secondary | ICD-10-CM | POA: Insufficient documentation

## 2017-11-23 DIAGNOSIS — Z803 Family history of malignant neoplasm of breast: Secondary | ICD-10-CM | POA: Insufficient documentation

## 2017-11-23 DIAGNOSIS — Z809 Family history of malignant neoplasm, unspecified: Secondary | ICD-10-CM | POA: Insufficient documentation

## 2017-11-23 DIAGNOSIS — K631 Perforation of intestine (nontraumatic): Secondary | ICD-10-CM | POA: Insufficient documentation

## 2017-11-23 DIAGNOSIS — Z801 Family history of malignant neoplasm of trachea, bronchus and lung: Secondary | ICD-10-CM | POA: Insufficient documentation

## 2017-11-23 DIAGNOSIS — K5730 Diverticulosis of large intestine without perforation or abscess without bleeding: Secondary | ICD-10-CM | POA: Insufficient documentation

## 2017-11-23 DIAGNOSIS — R630 Anorexia: Secondary | ICD-10-CM | POA: Insufficient documentation

## 2017-11-23 DIAGNOSIS — R110 Nausea: Secondary | ICD-10-CM | POA: Insufficient documentation

## 2017-11-23 NOTE — Progress Notes (Signed)
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  Patient with recent hospital admission 12/22 until 1/7 for ileus, SBO.  Exploratory lap was performed on 12/26 with dilated bowel founds without evidenced of perforation.  Patient was started on TPN and much of admission was on NPO, ice chips until last few days of admission.  Patient reports 2 weeks prior to admission not eating well due to abdominal discomfort.  Patient currently not on chemotherapy but oral chemotherapy is planned.  Noted CT of abdomen planned tomorrow 2/7.  Patient reports that 2 days ago abdominal bloating has gone away and he has been able to eat a little more following hospital admission.  Reports yesterday was able to eat 2 eggs, 2 strips of bacon and piece of toast, for lunch yesterday had a roast beef sandwich (100%) and last night for dinner ate 1 piece of pizza.  Reports prior to the past couple of days has only been able to eat few bites at a time. Reports has been drinking premier protein only 1 per day but takes him all day to drink it.  Does not like ensure drinks and does not want to try any other varieties.    Reports after coming home from hospital had diarrhea but over the past week has had more normal consistency bowel movement about 2 times per day. Reports this is normal for him all his life.    Nutrition Focused Physical Exam: deferred today, fully clothed  Medications: MVI, megace, senokot  Labs: reviewed  Anthropometrics:   Height: 71 inches Weight: 181 lb 3.2 oz UBW: 198-200 lb BMI: 25  Noted weight on 09/23/2017 202 lb  10% weight loss in the last 2 months, significant   Estimated Energy Needs  Kcals: 2050-2460 calories/d Protein: 98-123 g/d Fluid: 2.4 L/d  NUTRITION DIAGNOSIS: Malnutrition related to altered GI function (ileus/SBO/surgery) as evidenced by 10% weight loss in the last 2  months and eating < 50% of energy needs for > or equal to 5 days.    MALNUTRITION DIAGNOSIS: Patient meets criteria for severe malnutrition in context of acute illness as evidenced by 10% weight loss in the last 2 months and eating < 50% of energy needs for > or equal to 5 days.   INTERVENTION:   Encouraged small frequent meals. Discussed high calorie, high protein foods and list of foods provided Encouraged patient to continue to drink premier protein shake for added calories and protein.   Contact information provided    MONITORING, EVALUATION, GOAL: Patient will consume adequate calories and protein to maintain weight and resume oral chemotherapy   NEXT VISIT: Feb 14 following MD appt  Arieliz Latino B. Zenia Resides, [place], [place] Registered Dietitian [phone] (pager)

## 2017-11-26 ENCOUNTER — Ambulatory Visit
Admission: RE | Admit: 2017-11-26 | Discharge: 2017-11-26 | Disposition: A | Discharge status: Home / Self Care | Payer: Medicare Other | Source: Ambulatory Visit | Attending: Oncology | Admitting: Oncology

## 2017-11-26 DIAGNOSIS — R933 Abnormal findings on diagnostic imaging of other parts of digestive tract: Secondary | ICD-10-CM | POA: Insufficient documentation

## 2017-11-26 DIAGNOSIS — R112 Nausea with vomiting, unspecified: Secondary | ICD-10-CM | POA: Diagnosis not present

## 2017-11-26 DIAGNOSIS — R634 Abnormal weight loss: Secondary | ICD-10-CM | POA: Diagnosis not present

## 2017-11-26 DIAGNOSIS — M899 Disorder of bone, unspecified: Secondary | ICD-10-CM | POA: Insufficient documentation

## 2017-11-26 HISTORY — DX: Malignant (primary) neoplasm, unspecified: C80.1

## 2017-11-26 MED ORDER — IOPAMIDOL (ISOVUE-300) INJECTION 61%
100.0000 mL | Freq: Once | INTRAVENOUS | Status: AC | PRN
  Administered 2017-11-26: 100 mL via INTRAVENOUS

## 2017-11-27 NOTE — Progress Notes (Signed)
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  INTERVAL HISTORY: Patient returns to clinic today for further evaluation, discussion of his imaging results, and determination whether to reinitiate treatment.  He feels significantly improved.  He no longer has abdominal pain and his appetite is improving.  He continues to have occasional nausea, but this is improved as well. He has no neurologic complaints. He denies any recent fevers. He has no chest pain, cough, or hemoptysis. He denies any constipation or diarrhea. He has no melena or hematochezia. He has no urinary complaints. Patient offers no further specific complaints today.  REVIEW OF SYSTEMS:   Review of Systems  Constitutional: Positive for malaise/fatigue. Negative for fever and weight loss.  Respiratory: Negative.  Negative for cough and shortness of breath.   Cardiovascular: Negative.  Negative for chest pain and leg swelling.  Gastrointestinal: Positive for nausea. Negative for abdominal pain, blood in stool, melena and vomiting.  Genitourinary: Negative.   Musculoskeletal: Negative.   Skin: Negative.  Negative for rash.  Neurological: Positive for weakness. Negative for sensory change.  Psychiatric/Behavioral: Negative.  The patient is not nervous/anxious.     As per HPI. Otherwise, a complete review of systems is negative.  PAST MEDICAL HISTORY: Past Medical History:  Diagnosis Date  . Anemia   . Arthritis   . Cancer ([facility])   . Complication of anesthesia   . PONV (postoperative nausea and vomiting)     PAST SURGICAL HISTORY: Past Surgical History:  Procedure Laterality Date  . AXILLARY LYMPH NODE DISSECTION Left 06/26/2017   Procedure: EXCISION OF LEFT AXILLARY  MASS;  Surgeon: Johnathan Hausen, MD;  Location: [facility] ORS;  Service: General;  Laterality: Left;  . LAPAROTOMY N/A 10/14/2017   Procedure: EXPLORATORY LAPAROTOMY;  Surgeon: Clayburn Pert, MD;  Location: [facility] ORS;  Service: General;  Laterality: N/A;  . VASECTOMY      FAMILY HISTORY: Family History  Problem Relation Age of Onset  . Diabetes Mother   . Hypertension Mother   . Leukemia Father   . Thyroid disease Sister   . Liver disease Maternal Grandmother   . Heart Problems Maternal Grandfather   . Diabetes Maternal Grandfather   . Lung cancer Paternal Grandmother   . Prostate cancer Paternal Grandfather     ADVANCED DIRECTIVES (Y/N):  N  HEALTH MAINTENANCE: Social History   Tobacco Use  . Smoking status: Never Smoker  . Smokeless tobacco: Never Used  Substance Use Topics  . Alcohol use: No  . Drug use: No     Colonoscopy:  PAP:  Bone density:  Lipid panel:  No Known Allergies  Current Outpatient Medications  Medication Sig Dispense Refill  . Multiple Vitamin (MULTIVITAMIN) tablet Take 1 tablet by mouth daily.    . Polyethylene Glycol 3350 (PEG 3350) POWD Take by mouth.    . senna-docusate (SENOKOT-S) 8.6-50 MG tablet Take by mouth.    . vemurafenib (ZELBORAF) 240 MG tablet     . ZELBORAF 240 MG tablet   5   No current facility-administered medications for this visit.     OBJECTIVE: Vitals:   12/03/17 1041  BP: 118/79  Pulse: 64  Resp: 16  Temp: (!) 97.1 F (36.2 C)     Body mass index is 25.24 kg/m.  ECOG FS:0 - Asymptomatic  General: Thin, no acute distress. Eyes: Pink conjunctiva, anicteric sclera. Lungs: Clear to auscultation bilaterally. Heart: Regular rate and rhythm. No rubs, murmurs, or gallops. Abdomen: Soft, nontender, nondistended. No organomegaly noted, normoactive bowel sounds. Musculoskeletal: No edema, cyanosis, or clubbing. Neuro: Alert, answering all questions appropriately. Cranial nerves grossly intact. Skin: No rashes or  petechiae noted. Psych: Normal affect.   LAB RESULTS:  Lab Results  Component Value Date   NA 135 10/26/2017   K 3.1 (L) 10/26/2017   CL 96 (L) 10/26/2017   CO2 30 10/26/2017   GLUCOSE 117 (H) 10/26/2017   BUN 25 (H) 10/26/2017   CREATININE 1.09 10/26/2017   CALCIUM 8.4 (L) 10/26/2017   PROT 6.3 (L) 10/26/2017   ALBUMIN 2.8 (L) 10/26/2017   AST 29 10/26/2017   ALT 31 10/26/2017   ALKPHOS 67 10/26/2017   BILITOT 0.6 10/26/2017   GFRNONAA >60 10/26/2017   GFRAA >60 10/26/2017    Lab Results  Component Value Date   WBC 5.9 10/26/2017   NEUTROABS 4.2 10/26/2017   HGB 11.8 (L) 10/26/2017   HCT 35.0 (L) 10/26/2017   MCV 77.0 (L) 10/26/2017   PLT 276 10/26/2017     STUDIES: Ct Abdomen Pelvis W Contrast  Result Date: 11/27/2017 CLINICAL DATA:  Bloating and loss of appetite. Weight loss. Recent episode of pneumatosis with exploratory laparotomy but no definite site of leak identified. History of metastatic melanoma. EXAM: CT ABDOMEN AND PELVIS WITH CONTRAST TECHNIQUE: Multidetector CT imaging of the abdomen and pelvis was performed using the standard protocol following bolus administration of intravenous contrast. CONTRAST:  165m ISOVUE-300 IOPAMIDOL (ISOVUE-300) INJECTION 61% COMPARISON:  10/17/2015 FINDINGS: Lower chest: Prior right lower lobe atelectasis has improved. There is some continued atelectasis in the lingula and left lower lobe. Hepatobiliary: Several small hypodense hepatic lesions are stable and likely benign cysts. Gallbladder unremarkable. Pancreas: Unremarkable Spleen: Unremarkable Adrenals/Urinary Tract: Several small hypodense left renal lesions are technically too small to characterize although statistically likely to be cysts. Adrenal glands normal. Stomach/Bowel: No current pneumatosis or free intraperitoneal gas. Several sigmoid colon diverticula are present. Several loops of small bowel are mildly dilated and there scattered air-fluid levels in the contrast in  the small bowel. Contrast extends through to the colon. The proximal appendix is mildly thickened at 8 mm in diameter on image 52/4, although the distal appendix is normal caliber. Vascular/Lymphatic: Unremarkable Reproductive: Unremarkable Other: Numerous small mesenteric lymph nodes are present but do not appear pathologically enlarged by size criteria. These could be reactive. Musculoskeletal: Expansile 2.2 by 1.4 cm lesion in the left iliac crest, primarily lucent but with some rim sclerosis, previously mildly hypermetabolic. IMPRESSION: 1. There are several loops of mildly dilated small bowel along with scattered air-fluid levels in the small bowel. The prior pneumatosis and free intraperitoneal gas has resolved. There scattered small likely reactive small-bowel lymph nodes. Most likely the appearance today is due to ileus or mild enteritis. I do not see definite small bowel mass to suggest metastatic melanoma to the small bowel at this time. 2. Essentially stable appearance of the rim sclerotic lucent lesion with ground-glass density in the left iliac crest which has demonstrated week metabolic activity on PET-CT. While this may represent a metastatic lesion, the fact that it has been much lower in metabolic activity compared to the left axillary mass and the visual appearance also raise the possibility that this may be due to benign fibrous dysplasia, which can demonstrate accentuated metabolic activity.  3. Mild thickening of the proximal appendix at 8 mm without surrounding inflammatory findings to further suggest acute appendicitis. Distally the appendix is of normal caliber. Electronically Signed   By: Van Clines M.D.   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  PLAN:    1. 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  Because patient's BRAF mutation is positive, will proceed with 960 mg Zelboraf daily which patient will initiate on December 18, 2017.  Return to clinic in 1 month, or 2 weeks after initiating treatment for repeat laboratory work and further evaluation. 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 3.  Poor appetite: Improved.  Patient was given a prescription for Megace and a referral to dietary. 4.  Abdominal bloating: Resolved.  CT scan results reviewed independently report as above.  5.  Intractable nausea: Resolved.  Continue current medication as prescribed.  CT scan as above.  Approximately 30 minutes was spent in discussion of which greater than 50% was consultation.  Patient expressed understanding and was in agreement with this plan. He also understands that He can call clinic at any time with any questions, concerns, or complaints.   Cancer Staging Metastatic melanoma [facility]) Staging form: Melanoma of the Skin, AJCC 8th Edition - Clinical stage from 07/15/2017: Stage IV (cTX, cN1, cM1c(0)) - Signed by Lloyd Huger, MD on 07/15/2017   Lloyd Huger, MD   12/05/2017 9:06 AM

## 2017-12-03 ENCOUNTER — Inpatient Hospital Stay: Payer: Medicare Other

## 2017-12-03 ENCOUNTER — Encounter: Payer: Self-pay | Admitting: Oncology

## 2017-12-03 ENCOUNTER — Inpatient Hospital Stay (HOSPITAL_BASED_OUTPATIENT_CLINIC_OR_DEPARTMENT_OTHER): Payer: Medicare Other | Admitting: Oncology

## 2017-12-03 VITALS — BP 118/79 | HR 64 | Temp 97.1°F | Resp 16 | Wt 181.0 lb

## 2017-12-03 DIAGNOSIS — Z801 Family history of malignant neoplasm of trachea, bronchus and lung: Secondary | ICD-10-CM | POA: Diagnosis not present

## 2017-12-03 DIAGNOSIS — M129 Arthropathy, unspecified: Secondary | ICD-10-CM

## 2017-12-03 DIAGNOSIS — C781 Secondary malignant neoplasm of mediastinum: Secondary | ICD-10-CM

## 2017-12-03 DIAGNOSIS — R110 Nausea: Secondary | ICD-10-CM | POA: Diagnosis not present

## 2017-12-03 DIAGNOSIS — C7951 Secondary malignant neoplasm of bone: Secondary | ICD-10-CM

## 2017-12-03 DIAGNOSIS — K631 Perforation of intestine (nontraumatic): Secondary | ICD-10-CM | POA: Diagnosis not present

## 2017-12-03 DIAGNOSIS — C439 Malignant melanoma of skin, unspecified: Secondary | ICD-10-CM | POA: Diagnosis not present

## 2017-12-03 DIAGNOSIS — Z809 Family history of malignant neoplasm, unspecified: Secondary | ICD-10-CM

## 2017-12-03 DIAGNOSIS — Z1501 Genetic susceptibility to malignant neoplasm of breast: Secondary | ICD-10-CM

## 2017-12-03 DIAGNOSIS — C799 Secondary malignant neoplasm of unspecified site: Secondary | ICD-10-CM

## 2017-12-03 DIAGNOSIS — Z803 Family history of malignant neoplasm of breast: Secondary | ICD-10-CM | POA: Diagnosis not present

## 2017-12-03 DIAGNOSIS — K5730 Diverticulosis of large intestine without perforation or abscess without bleeding: Secondary | ICD-10-CM

## 2017-12-03 DIAGNOSIS — R630 Anorexia: Secondary | ICD-10-CM | POA: Diagnosis not present

## 2017-12-03 DIAGNOSIS — D649 Anemia, unspecified: Secondary | ICD-10-CM

## 2017-12-03 NOTE — Progress Notes (Signed)
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  Patient reports planning to restart oral chemotherapy on March 1, currently has been off medication due to SBO/ileus hospital admission.    Met with patient today and friend following MD appointment.  Patient reports that appetite is getting better.  Reports that he has been eating a variety of foods, [place] last night for dinner.  Has been trying to focus on high protein foods and high calorie foods to improve nutrition.  Has not been eating sweets and does not want to start eating them again.  Reports over the last 2-3 days has had increase in diarrhea, usually starts after 6pm with multiple loose stools.  Reports no bloating or abdominal cramping.     Medications: reviewed  Labs: reviewed  Anthropometrics:   Weight 181 lb today stable since 1/30.     NUTRITION DIAGNOSIS: Malnutrition stable with stable weight   MALNUTRITION DIAGNOSIS: severe malnutrition continues   INTERVENTION:   Patient interested in foods to choose to help with diarrhea. Discussed foods to select with diarrhea.  Provided Diarrhea Nutrition therapy from [facility] to patient.   Encouraged patient to keep food diary as well to see if certain foods are causing diarrhea.     MONITORING, EVALUATION, GOAL: Patient will consume adequate calories and protein to maintain weight and resume oral chemotherapy  NEXT VISIT: March 14 following MD appt  Ernesto Rutherford. Zenia Resides, [place], [place] Registered Dietitian [phone] (pager)

## 2017-12-15 ENCOUNTER — Other Ambulatory Visit: Payer: Medicare Other

## 2017-12-15 ENCOUNTER — Ambulatory Visit: Payer: Medicare Other | Admitting: Oncology

## 2017-12-27 NOTE — Progress Notes (Signed)
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  INTERVAL HISTORY: Patient returns to clinic today for further evaluation, laboratory work, and to assess his toleration of Zelboraf.  He continues to have chronic weakness and fatigue.  He has also developed a mild rash on his arms.  He otherwise feels well. He has no neurologic complaints. He denies any recent fevers or illnesses. He has no chest pain, cough, or hemoptysis. He denies any nausea, vomiting, constipation, or diarrhea. He has no melena or hematochezia. He has no urinary complaints. Patient offers no further specific complaints today.  REVIEW OF SYSTEMS:   Review of Systems  Constitutional: Positive for malaise/fatigue. Negative for fever and weight loss.  Respiratory: Negative.  Negative for cough and shortness of breath.   Cardiovascular: Negative.  Negative for chest pain and leg swelling.  Gastrointestinal: Negative.  Negative for abdominal pain, blood in stool, melena, nausea and vomiting.  Genitourinary: Negative.   Musculoskeletal: Negative.   Skin: Positive for rash.  Neurological: Positive for weakness. Negative for sensory change.  Psychiatric/Behavioral: Negative.  The patient is not nervous/anxious.     As per HPI. Otherwise, a complete review of systems is negative.  PAST MEDICAL HISTORY: Past Medical History:  Diagnosis Date  . Anemia   . Arthritis   . Cancer ([place])   . Complication of anesthesia   . PONV (postoperative nausea and vomiting)     PAST SURGICAL HISTORY: Past Surgical History:  Procedure Laterality Date  . AXILLARY LYMPH NODE DISSECTION Left 06/26/2017   Procedure: EXCISION OF LEFT AXILLARY MASS;  Surgeon: Johnathan Hausen, MD;  Location: [facility]  ORS;  Service: General;  Laterality: Left;  . LAPAROTOMY N/A 10/14/2017   Procedure: EXPLORATORY LAPAROTOMY;  Surgeon: Clayburn Pert, MD;  Location: [facility] ORS;  Service: General;  Laterality: N/A;  . VASECTOMY      FAMILY HISTORY: Family History  Problem Relation Age of Onset  . Diabetes Mother   . Hypertension Mother   . Leukemia Father   . Thyroid disease Sister   . Liver disease Maternal Grandmother   . Heart Problems Maternal Grandfather   . Diabetes Maternal Grandfather   . Lung cancer Paternal Grandmother   . Prostate cancer Paternal Grandfather     ADVANCED DIRECTIVES (Y/N):  N  HEALTH MAINTENANCE: Social History   Tobacco Use  . Smoking status: Never Smoker  . Smokeless tobacco: Never Used  Substance Use Topics  . Alcohol use: No  . Drug use: No     Colonoscopy:  PAP:  Bone density:  Lipid panel:  No Known Allergies  Current Outpatient Medications  Medication Sig Dispense Refill  . Multiple Vitamin (MULTIVITAMIN) tablet Take 1 tablet by mouth daily.    . vemurafenib (ZELBORAF) 240 MG tablet Take by mouth.     Marland Kitchen ZELBORAF 240 MG tablet   5  . senna-docusate (SENOKOT-S) 8.6-50 MG tablet Take by mouth.     No current facility-administered medications for this visit.     OBJECTIVE: Vitals:   12/31/17 1456  BP: 127/60  Pulse: 65  Resp: 18  Temp: 98.7 F (37.1 C)     Body mass index is 25.47 kg/m.    ECOG FS:0 - Asymptomatic  General: Thin, no acute distress. Eyes: Pink conjunctiva, anicteric sclera. Lungs:  Clear to auscultation bilaterally. Heart: Regular rate and rhythm. No rubs, murmurs, or gallops. Abdomen: Soft, nontender, nondistended. No organomegaly noted, normoactive bowel sounds. Musculoskeletal: No edema, cyanosis, or clubbing. Neuro: Alert, answering all questions appropriately. Cranial nerves grossly intact. Skin: No rashes or petechiae noted. Psych: Normal affect.   LAB RESULTS:  Lab Results  Component Value Date   NA 138  12/31/2017   K 3.5 12/31/2017   CL 107 12/31/2017   CO2 22 12/31/2017   GLUCOSE 115 (H) 12/31/2017   BUN 13 12/31/2017   CREATININE 1.00 12/31/2017   CALCIUM 8.3 (L) 12/31/2017   PROT 6.8 12/31/2017   ALBUMIN 3.3 (L) 12/31/2017   AST 18 12/31/2017   ALT 15 (L) 12/31/2017   ALKPHOS 88 12/31/2017   BILITOT 0.8 12/31/2017   GFRNONAA >60 12/31/2017   GFRAA >60 12/31/2017    Lab Results  Component Value Date   WBC 3.6 (L) 12/31/2017   NEUTROABS 2.4 12/31/2017   HGB 10.1 (L) 12/31/2017   HCT 30.3 (L) 12/31/2017   MCV 77.4 (L) 12/31/2017   PLT 256 12/31/2017     STUDIES: No results found.  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  PLAN:    1. 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  Patient is having mild side effects, therefore will decrease his dose to to 720 mg every 12 hours.  Return to clinic in 2 weeks for repeat laboratory work and further evaluation. 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 3.  Poor appetite: Improved.  Continue Megace.  Appreciate dietary input. 4.  Abdominal bloating: Resolved.  5.  Intractable nausea: Resolved.  Continue current medication as prescribed.   Approximately 30 minutes was spent in discussion of which greater than 50% was consultation.  Patient expressed understanding and was in agreement with this plan. He also understands that He can call clinic at any time with any questions, concerns, or complaints.   Cancer Staging Metastatic melanoma [facility]) Staging form: Melanoma of the Skin, AJCC 8th Edition - Clinical stage from 07/15/2017: Stage IV (cTX, cN1, cM1c(0)) - Signed by Lloyd Huger, MD on 07/15/2017   Lloyd Huger, MD   01/03/2018 8:36  AM

## 2017-12-31 ENCOUNTER — Inpatient Hospital Stay (HOSPITAL_BASED_OUTPATIENT_CLINIC_OR_DEPARTMENT_OTHER): Payer: Medicare Other | Admitting: Oncology

## 2017-12-31 ENCOUNTER — Inpatient Hospital Stay: Payer: Medicare Other | Attending: Oncology

## 2017-12-31 ENCOUNTER — Inpatient Hospital Stay: Payer: Medicare Other

## 2017-12-31 ENCOUNTER — Encounter: Payer: Self-pay | Admitting: Oncology

## 2017-12-31 ENCOUNTER — Other Ambulatory Visit: Payer: Self-pay | Admitting: *Deleted

## 2017-12-31 VITALS — BP 127/60 | HR 65 | Temp 98.7°F | Resp 18 | Ht 71.0 in | Wt 182.6 lb

## 2017-12-31 DIAGNOSIS — Z8042 Family history of malignant neoplasm of prostate: Secondary | ICD-10-CM | POA: Insufficient documentation

## 2017-12-31 DIAGNOSIS — R630 Anorexia: Secondary | ICD-10-CM | POA: Diagnosis not present

## 2017-12-31 DIAGNOSIS — C7951 Secondary malignant neoplasm of bone: Secondary | ICD-10-CM | POA: Insufficient documentation

## 2017-12-31 DIAGNOSIS — C799 Secondary malignant neoplasm of unspecified site: Secondary | ICD-10-CM

## 2017-12-31 DIAGNOSIS — D649 Anemia, unspecified: Secondary | ICD-10-CM | POA: Insufficient documentation

## 2017-12-31 DIAGNOSIS — Z801 Family history of malignant neoplasm of trachea, bronchus and lung: Secondary | ICD-10-CM | POA: Insufficient documentation

## 2017-12-31 DIAGNOSIS — M129 Arthropathy, unspecified: Secondary | ICD-10-CM | POA: Diagnosis not present

## 2017-12-31 DIAGNOSIS — Z79899 Other long term (current) drug therapy: Secondary | ICD-10-CM | POA: Insufficient documentation

## 2017-12-31 DIAGNOSIS — R5383 Other fatigue: Secondary | ICD-10-CM | POA: Diagnosis not present

## 2017-12-31 DIAGNOSIS — C439 Malignant melanoma of skin, unspecified: Secondary | ICD-10-CM | POA: Diagnosis not present

## 2017-12-31 DIAGNOSIS — K631 Perforation of intestine (nontraumatic): Secondary | ICD-10-CM | POA: Diagnosis not present

## 2017-12-31 DIAGNOSIS — R531 Weakness: Secondary | ICD-10-CM | POA: Diagnosis not present

## 2017-12-31 LAB — COMPREHENSIVE METABOLIC PANEL
ALT: 15 U/L — AB (ref 17–63)
AST: 18 U/L (ref 15–41)
Albumin: 3.3 g/dL — ABNORMAL LOW (ref 3.5–5.0)
Alkaline Phosphatase: 88 U/L (ref 38–126)
Anion gap: 9 (ref 5–15)
BUN: 13 mg/dL (ref 6–20)
CALCIUM: 8.3 mg/dL — AB (ref 8.9–10.3)
CO2: 22 mmol/L (ref 22–32)
CREATININE: 1 mg/dL (ref 0.61–1.24)
Chloride: 107 mmol/L (ref 101–111)
Glucose, Bld: 115 mg/dL — ABNORMAL HIGH (ref 65–99)
Potassium: 3.5 mmol/L (ref 3.5–5.1)
SODIUM: 138 mmol/L (ref 135–145)
Total Bilirubin: 0.8 mg/dL (ref 0.3–1.2)
Total Protein: 6.8 g/dL (ref 6.5–8.1)

## 2017-12-31 LAB — CBC WITH DIFFERENTIAL/PLATELET
BASOS ABS: 0 10*3/uL (ref 0–0.1)
Basophils Relative: 1 %
EOS ABS: 0.2 10*3/uL (ref 0–0.7)
Eosinophils Relative: 5 %
HCT: 30.3 % — ABNORMAL LOW (ref 40.0–52.0)
HEMOGLOBIN: 10.1 g/dL — AB (ref 13.0–18.0)
Lymphocytes Relative: 13 %
Lymphs Abs: 0.5 10*3/uL — ABNORMAL LOW (ref 1.0–3.6)
MCH: 25.9 pg — ABNORMAL LOW (ref 26.0–34.0)
MCHC: 33.4 g/dL (ref 32.0–36.0)
MCV: 77.4 fL — ABNORMAL LOW (ref 80.0–100.0)
MONOS PCT: 16 %
Monocytes Absolute: 0.6 10*3/uL (ref 0.2–1.0)
NEUTROS PCT: 65 %
Neutro Abs: 2.4 10*3/uL (ref 1.4–6.5)
Platelets: 256 10*3/uL (ref 150–440)
RBC: 3.92 MIL/uL — ABNORMAL LOW (ref 4.40–5.90)
RDW: 16.3 % — AB (ref 11.5–14.5)
WBC: 3.6 10*3/uL — ABNORMAL LOW (ref 3.8–10.6)

## 2017-12-31 LAB — MAGNESIUM: Magnesium: 2.1 mg/dL (ref 1.7–2.4)

## 2017-12-31 LAB — PHOSPHORUS: Phosphorus: 2.7 mg/dL (ref 2.5–4.6)

## 2017-12-31 NOTE — Progress Notes (Signed)
Patient c/o feeling fatigue

## 2017-12-31 NOTE — Progress Notes (Signed)
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  Patient currently on oral chemotherapy of zelboraf.    Met with patient today following MD visit.  Patient reports appetite has been good. Typically eats eggs, bacon and toast for breakfast, sandwich for lunch and meat and couple vegetables for dinner.  Has not been drinking oral nutrition supplement shakes.  Patient reports normal bowel movements 2-3 times per day but that is normal for him and it is not watery or diarrhea.    No other nutrition impact symptoms reported today  Medications: reviewed  Labs: reviewed  Anthropometrics:   Weight has increased to 182 lb 9.6 oz today from  181 lb on 2/14.   Patient reports does not want to regain weight.   NUTRITION DIAGNOSIS: Malnutrition stable with stable weight   MALNUTRITION DIAGNOSIS: Severe malnutrition improving   INTERVENTION:   Encouraged patient to continue to consume good sources of calories and protein to prevent further weight loss. Patient to contact me with further questions or concerns    MONITORING, EVALUATION, GOAL: Patient will consume adequate calories and protein to maintain weight and resume oral chemotherapy.   NEXT VISIT: as needed, pt to contact me  Louis Davies B. Zenia Resides, [place], [place] Registered Dietitian [phone] (pager)

## 2018-01-04 ENCOUNTER — Telehealth: Payer: Self-pay | Admitting: *Deleted

## 2018-01-04 NOTE — Telephone Encounter (Signed)
Patient called wanting to discuss Zelboraf dosing with doctor stating he has a trip planned he does NOT want to miss and cannot go feeling the way he is feeling right now. Please return his call to discuss SE and dosing [phone]

## 2018-01-05 NOTE — Telephone Encounter (Signed)
Per patient he is going to [place] 4/4-01/26/18. He statse that he is not improved with dose reduction and yesterday was his worse day yet, he has no energy at all and states he is not going to upset his son who has paid for this trip prior to his getting sick and has to feel better before he goes. He states he understands that you do not want him off drug for extended time, he also states he needs to be off it long enough to bounce back some before he goes. Please advise

## 2018-01-05 NOTE — Telephone Encounter (Signed)
Hold Zelboraf until he returns for [place].  I will see him after that the discuss treatments and dosing.

## 2018-01-05 NOTE — Telephone Encounter (Signed)
Patient informed and states he wants to thank [name] for this and will see [place] when he returns

## 2018-01-05 NOTE — Telephone Encounter (Signed)
Called back and left a message.

## 2018-01-10 NOTE — Progress Notes (Deleted)
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  INTERVAL HISTORY: Patient returns to clinic today for further evaluation, laboratory work, and to assess his toleration of Zelboraf.  He continues to have chronic weakness and fatigue.  He has also developed a mild rash on his arms.  He otherwise feels well. He has no neurologic complaints. He denies any recent fevers or illnesses. He has no chest pain, cough, or hemoptysis. He denies any nausea, vomiting, constipation, or diarrhea. He has no melena or hematochezia. He has no urinary complaints. Patient offers no further specific complaints today.  REVIEW OF SYSTEMS:   Review of Systems  Constitutional: Positive for malaise/fatigue. Negative for fever and weight loss.  Respiratory: Negative.  Negative for cough and shortness of breath.   Cardiovascular: Negative.  Negative for chest pain and leg swelling.  Gastrointestinal: Negative.  Negative for abdominal pain, blood in stool, melena, nausea and vomiting.  Genitourinary: Negative.   Musculoskeletal: Negative.   Skin: Positive for rash.  Neurological: Positive for weakness. Negative for sensory change.  Psychiatric/Behavioral: Negative.  The patient is not nervous/anxious.     As per HPI. Otherwise, a complete review of systems is negative.  PAST MEDICAL HISTORY: Past Medical History:  Diagnosis Date  . Anemia   . Arthritis   . Cancer ([place])   . Complication of anesthesia   . PONV (postoperative nausea and vomiting)     PAST SURGICAL HISTORY: Past Surgical History:  Procedure Laterality Date  . AXILLARY LYMPH NODE DISSECTION Left 06/26/2017   Procedure: EXCISION OF LEFT AXILLARY MASS;  Surgeon: Johnathan Hausen, MD;  Location: [facility]  ORS;  Service: General;  Laterality: Left;  . LAPAROTOMY N/A 10/14/2017   Procedure: EXPLORATORY LAPAROTOMY;  Surgeon: Clayburn Pert, MD;  Location: [facility] ORS;  Service: General;  Laterality: N/A;  . VASECTOMY      FAMILY HISTORY: Family History  Problem Relation Age of Onset  . Diabetes Mother   . Hypertension Mother   . Leukemia Father   . Thyroid disease Sister   . Liver disease Maternal Grandmother   . Heart Problems Maternal Grandfather   . Diabetes Maternal Grandfather   . Lung cancer Paternal Grandmother   . Prostate cancer Paternal Grandfather     ADVANCED DIRECTIVES (Y/N):  N  HEALTH MAINTENANCE: Social History   Tobacco Use  . Smoking status: Never Smoker  . Smokeless tobacco: Never Used  Substance Use Topics  . Alcohol use: No  . Drug use: No     Colonoscopy:  PAP:  Bone density:  Lipid panel:  No Known Allergies  Current Outpatient Medications  Medication Sig Dispense Refill  . Multiple Vitamin (MULTIVITAMIN) tablet Take 1 tablet by mouth daily.    Marland Kitchen senna-docusate (SENOKOT-S) 8.6-50 MG tablet Take by mouth.    . vemurafenib (ZELBORAF) 240 MG tablet Take by mouth.     Marland Kitchen ZELBORAF 240 MG tablet   5   No current facility-administered medications for this visit.     OBJECTIVE: There were no vitals filed for this visit.   There is no height or weight on file to calculate BMI.    ECOG FS:0 - Asymptomatic  General: Thin, no acute distress. Eyes: Pink conjunctiva, anicteric sclera. Lungs: Clear to auscultation bilaterally. Heart: Regular rate and rhythm.  No rubs, murmurs, or gallops. Abdomen: Soft, nontender, nondistended. No organomegaly noted, normoactive bowel sounds. Musculoskeletal: No edema, cyanosis, or clubbing. Neuro: Alert, answering all questions appropriately. Cranial nerves grossly intact. Skin: No rashes or petechiae noted. Psych: Normal affect.   LAB RESULTS:  Lab Results  Component Value Date   NA 138 12/31/2017   K 3.5  12/31/2017   CL 107 12/31/2017   CO2 22 12/31/2017   GLUCOSE 115 (H) 12/31/2017   BUN 13 12/31/2017   CREATININE 1.00 12/31/2017   CALCIUM 8.3 (L) 12/31/2017   PROT 6.8 12/31/2017   ALBUMIN 3.3 (L) 12/31/2017   AST 18 12/31/2017   ALT 15 (L) 12/31/2017   ALKPHOS 88 12/31/2017   BILITOT 0.8 12/31/2017   GFRNONAA >60 12/31/2017   GFRAA >60 12/31/2017    Lab Results  Component Value Date   WBC 3.6 (L) 12/31/2017   NEUTROABS 2.4 12/31/2017   HGB 10.1 (L) 12/31/2017   HCT 30.3 (L) 12/31/2017   MCV 77.4 (L) 12/31/2017   PLT 256 12/31/2017     STUDIES: No results found.  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  PLAN:    1. 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  Patient is having mild side effects, therefore will decrease his dose to to 720 mg every 12 hours.  Return to clinic in 2 weeks for repeat laboratory work and further evaluation. 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 3.  Poor appetite: Improved.  Continue Megace.  Appreciate dietary input. 4.  Abdominal bloating: Resolved.  5.  Intractable nausea: Resolved.  Continue current medication as prescribed.   Approximately 30 minutes was spent in discussion of which greater than 50% was consultation.  Patient expressed understanding and was in agreement with this plan. He also understands that He can call clinic at any time with any questions, concerns, or complaints.   Cancer Staging Metastatic melanoma [facility]) Staging form: Melanoma of the Skin, AJCC 8th Edition - Clinical stage from 07/15/2017: Stage IV (cTX, cN1, cM1c(0)) - Signed by Lloyd Huger, MD on 07/15/2017   Lloyd Huger, MD   01/10/2018 10:55 PM

## 2018-01-14 ENCOUNTER — Inpatient Hospital Stay: Payer: Medicare Other

## 2018-01-14 ENCOUNTER — Inpatient Hospital Stay: Payer: Medicare Other | Admitting: Oncology

## 2018-02-14 NOTE — Progress Notes (Deleted)
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  INTERVAL HISTORY: Patient returns to clinic today for further evaluation, laboratory work, and to assess his toleration of Zelboraf.  He continues to have chronic weakness and fatigue.  He has also developed a mild rash on his arms.  He otherwise feels well. He has no neurologic complaints. He denies any recent fevers or illnesses. He has no chest pain, cough, or hemoptysis. He denies any nausea, vomiting, constipation, or diarrhea. He has no melena or hematochezia. He has no urinary complaints. Patient offers no further specific complaints today.  REVIEW OF SYSTEMS:   Review of Systems  Constitutional: Positive for malaise/fatigue. Negative for fever and weight loss.  Respiratory: Negative.  Negative for cough and shortness of breath.   Cardiovascular: Negative.  Negative for chest pain and leg swelling.  Gastrointestinal: Negative.  Negative for abdominal pain, blood in stool, melena, nausea and vomiting.  Genitourinary: Negative.   Musculoskeletal: Negative.   Skin: Positive for rash.  Neurological: Positive for weakness. Negative for sensory change.  Psychiatric/Behavioral: Negative.  The patient is not nervous/anxious.     As per HPI. Otherwise, a complete review of systems is negative.  PAST MEDICAL HISTORY: Past Medical History:  Diagnosis Date  . Anemia   . Arthritis   . Cancer ([place])   . Complication of anesthesia   . PONV (postoperative nausea and vomiting)     PAST SURGICAL HISTORY: Past Surgical History:  Procedure Laterality Date  . AXILLARY LYMPH NODE DISSECTION Left 06/26/2017   Procedure: EXCISION OF LEFT AXILLARY MASS;  Surgeon: Johnathan Hausen, MD;  Location: [facility]  ORS;  Service: General;  Laterality: Left;  . LAPAROTOMY N/A 10/14/2017   Procedure: EXPLORATORY LAPAROTOMY;  Surgeon: Clayburn Pert, MD;  Location: [facility] ORS;  Service: General;  Laterality: N/A;  . VASECTOMY      FAMILY HISTORY: Family History  Problem Relation Age of Onset  . Diabetes Mother   . Hypertension Mother   . Leukemia Father   . Thyroid disease Sister   . Liver disease Maternal Grandmother   . Heart Problems Maternal Grandfather   . Diabetes Maternal Grandfather   . Lung cancer Paternal Grandmother   . Prostate cancer Paternal Grandfather     ADVANCED DIRECTIVES (Y/N):  N  HEALTH MAINTENANCE: Social History   Tobacco Use  . Smoking status: Never Smoker  . Smokeless tobacco: Never Used  Substance Use Topics  . Alcohol use: No  . Drug use: No     Colonoscopy:  PAP:  Bone density:  Lipid panel:  No Known Allergies  Current Outpatient Medications  Medication Sig Dispense Refill  . Multiple Vitamin (MULTIVITAMIN) tablet Take 1 tablet by mouth daily.    Marland Kitchen senna-docusate (SENOKOT-S) 8.6-50 MG tablet Take by mouth.    . vemurafenib (ZELBORAF) 240 MG tablet Take by mouth.     Marland Kitchen ZELBORAF 240 MG tablet   5   No current facility-administered medications for this visit.     OBJECTIVE: There were no vitals filed for this visit.   There is no height or weight on file to calculate BMI.    ECOG FS:0 - Asymptomatic  General: Thin, no acute distress. Eyes: Pink conjunctiva, anicteric sclera. Lungs: Clear to auscultation bilaterally. Heart: Regular rate and rhythm.  No rubs, murmurs, or gallops. Abdomen: Soft, nontender, nondistended. No organomegaly noted, normoactive bowel sounds. Musculoskeletal: No edema, cyanosis, or clubbing. Neuro: Alert, answering all questions appropriately. Cranial nerves grossly intact. Skin: No rashes or petechiae noted. Psych: Normal affect.   LAB RESULTS:  Lab Results  Component Value Date   NA 138 12/31/2017   K 3.5  12/31/2017   CL 107 12/31/2017   CO2 22 12/31/2017   GLUCOSE 115 (H) 12/31/2017   BUN 13 12/31/2017   CREATININE 1.00 12/31/2017   CALCIUM 8.3 (L) 12/31/2017   PROT 6.8 12/31/2017   ALBUMIN 3.3 (L) 12/31/2017   AST 18 12/31/2017   ALT 15 (L) 12/31/2017   ALKPHOS 88 12/31/2017   BILITOT 0.8 12/31/2017   GFRNONAA >60 12/31/2017   GFRAA >60 12/31/2017    Lab Results  Component Value Date   WBC 3.6 (L) 12/31/2017   NEUTROABS 2.4 12/31/2017   HGB 10.1 (L) 12/31/2017   HCT 30.3 (L) 12/31/2017   MCV 77.4 (L) 12/31/2017   PLT 256 12/31/2017     STUDIES: No results found.  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  PLAN:    1. 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  Patient is having mild side effects, therefore will decrease his dose to to 720 mg every 12 hours.  Return to clinic in 2 weeks for repeat laboratory work and further evaluation. 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 3.  Poor appetite: Improved.  Continue Megace.  Appreciate dietary input. 4.  Abdominal bloating: Resolved.  5.  Intractable nausea: Resolved.  Continue current medication as prescribed.   Approximately 30 minutes was spent in discussion of which greater than 50% was consultation.  Patient expressed understanding and was in agreement with this plan. He also understands that He can call clinic at any time with any questions, concerns, or complaints.   Cancer Staging Metastatic melanoma [facility]) Staging form: Melanoma of the Skin, AJCC 8th Edition - Clinical stage from 07/15/2017: Stage IV (cTX, cN1, cM1c(0)) - Signed by Lloyd Huger, MD on 07/15/2017   Lloyd Huger, MD   02/14/2018 9:25 AM

## 2018-02-18 ENCOUNTER — Other Ambulatory Visit: Payer: Medicare Other

## 2018-02-18 ENCOUNTER — Ambulatory Visit: Payer: Medicare Other | Admitting: Oncology

## 2018-02-19 ENCOUNTER — Telehealth: Payer: Self-pay | Admitting: Pharmacist

## 2018-02-19 ENCOUNTER — Telehealth: Payer: Self-pay | Admitting: *Deleted

## 2018-02-19 ENCOUNTER — Other Ambulatory Visit: Payer: Self-pay | Admitting: Oncology

## 2018-02-19 DIAGNOSIS — C799 Secondary malignant neoplasm of unspecified site: Secondary | ICD-10-CM

## 2018-02-19 DIAGNOSIS — C439 Malignant melanoma of skin, unspecified: Secondary | ICD-10-CM

## 2018-02-19 MED ORDER — VEMURAFENIB 240 MG PO TABS: 480.0000 mg | ORAL_TABLET | Freq: Two times a day (BID) | ORAL | 2 refills | Status: DC

## 2018-02-19 MED ORDER — VEMURAFENIB 240 MG PO TABS: 960.0000 mg | ORAL_TABLET | Freq: Two times a day (BID) | ORAL | 2 refills | Status: DC

## 2018-02-19 MED FILL — ZELBORAF 240 MG TABS: 240 | 28 days supply | Qty: 112 | Fill #0

## 2018-02-19 NOTE — Telephone Encounter (Signed)
Oral Chemotherapy Pharmacist Encounter   Received a phone call from Mr. Topete stating that he resumed his Zelboraf on 01/27/18 and now needs a refill. He stated he resumed at 2 tablets (480mg  total) by mouth twice daily.  Informed Dr. Grayland Ormond and he has written a new prescription for the dose reduction of Zelboraf.  Called patient to inform him that a prescription has been sent in for him. Will let [place] know to proceed with medication shipment.   Darl Pikes, PharmD, BCPS Hematology/Oncology Clinical Pharmacist [facility] [phone]  02/19/2018 1:42 PM

## 2018-02-19 NOTE — Telephone Encounter (Signed)
NA

## 2018-02-22 NOTE — Progress Notes (Signed)
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  INTERVAL HISTORY: Patient returns to clinic today for further evaluation.  He recently held his Zelboraf while on a trip to [place], but now has restarted at a lower dose of 2 pills 2 times per day.  He is tolerating this dosing well without significant side effects.  His weakness and fatigue have improved. He has no neurologic complaints. He denies any recent fevers or illnesses. He has no chest pain, cough, or hemoptysis. He denies any nausea, vomiting, constipation, or diarrhea. He has no melena or hematochezia. He has no urinary complaints.  Patient has no specific complaints today.  REVIEW OF SYSTEMS:   Review of Systems  Constitutional: Negative.  Negative for fever, malaise/fatigue and weight loss.  Respiratory: Negative.  Negative for cough and shortness of breath.   Cardiovascular: Negative.  Negative for chest pain and leg swelling.  Gastrointestinal: Negative.  Negative for abdominal pain, blood in stool, melena, nausea and vomiting.  Genitourinary: Negative.  Negative for dysuria.  Musculoskeletal: Negative.  Negative for myalgias.  Skin: Negative.  Negative for rash.  Neurological: Negative.  Negative for sensory change, focal weakness and weakness.  Psychiatric/Behavioral: Negative.  The patient is not nervous/anxious.     As per HPI. Otherwise, a complete review of systems is negative.  PAST MEDICAL HISTORY: Past Medical History:  Diagnosis Date  . Anemia   . Arthritis   . Cancer ([place])   . Complication of anesthesia   . PONV (postoperative nausea and vomiting)     PAST SURGICAL HISTORY: Past Surgical History:  Procedure Laterality Date  . AXILLARY LYMPH NODE  DISSECTION Left 06/26/2017   Procedure: EXCISION OF LEFT AXILLARY MASS;  Surgeon: Johnathan Hausen, MD;  Location: [facility] ORS;  Service: General;  Laterality: Left;  . LAPAROTOMY N/A 10/14/2017   Procedure: EXPLORATORY LAPAROTOMY;  Surgeon: Clayburn Pert, MD;  Location: [facility] ORS;  Service: General;  Laterality: N/A;  . VASECTOMY      FAMILY HISTORY: Family History  Problem Relation Age of Onset  . Diabetes Mother   . Hypertension Mother   . Leukemia Father   . Thyroid disease Sister   . Liver disease Maternal Grandmother   . Heart Problems Maternal Grandfather   . Diabetes Maternal Grandfather   . Lung cancer Paternal Grandmother   . Prostate cancer Paternal Grandfather     ADVANCED DIRECTIVES (Y/N):  N  HEALTH MAINTENANCE: Social History   Tobacco Use  . Smoking status: Never Smoker  . Smokeless tobacco: Never Used  Substance Use Topics  . Alcohol use: No  . Drug use: No     Colonoscopy:  PAP:  Bone density:  Lipid panel:  No Known Allergies  Current Outpatient Medications  Medication Sig Dispense Refill  . Multiple Vitamin (MULTIVITAMIN) tablet Take 1 tablet by mouth daily.    . vemurafenib (ZELBORAF) 240 MG tablet Take 2 tablets (480 mg total) by mouth every 12 (twelve) hours. 120 tablet 2  . senna-docusate (SENOKOT-S) 8.6-50 MG tablet Take by mouth.     No current facility-administered medications for this visit.     OBJECTIVE: Vitals:   02/24/18 1027  BP: 140/74  Pulse: (!) 54  Resp: 18  Temp: (!) 95.8 F (35.4 C)     Body  mass index is 26.21 kg/m.    ECOG FS:0 - Asymptomatic  General: Well-developed, well-nourished, no acute distress. Eyes: Pink conjunctiva, anicteric sclera. Lungs: Clear to auscultation bilaterally. Heart: Regular rate and rhythm. No rubs, murmurs, or gallops. Abdomen: Soft, nontender, nondistended. No organomegaly noted, normoactive bowel sounds. Musculoskeletal: No edema, cyanosis, or clubbing. Neuro: Alert, answering all  questions appropriately. Cranial nerves grossly intact. Skin: No rashes or petechiae noted. Psych: Normal affect.   LAB RESULTS:  Lab Results  Component Value Date   NA 139 02/24/2018   K 4.0 02/24/2018   CL 107 02/24/2018   CO2 23 02/24/2018   GLUCOSE 96 02/24/2018   BUN 15 02/24/2018   CREATININE 1.24 02/24/2018   CALCIUM 8.8 (L) 02/24/2018   PROT 7.2 02/24/2018   ALBUMIN 3.9 02/24/2018   AST 40 02/24/2018   ALT 52 02/24/2018   ALKPHOS 76 02/24/2018   BILITOT 0.8 02/24/2018   GFRNONAA 58 (L) 02/24/2018   GFRAA >60 02/24/2018    Lab Results  Component Value Date   WBC 4.0 02/24/2018   NEUTROABS 2.6 02/24/2018   HGB 11.8 (L) 02/24/2018   HCT 35.7 (L) 02/24/2018   MCV 76.7 (L) 02/24/2018   PLT 215 02/24/2018     STUDIES: No results found.  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  PLAN:    1. 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  Patient held [place] for several weeks while on an extended trip to [place], but now has restarted at a 50% dose of 480 mg twice per day.  We will get a PET scan in the next 1 to 2 weeks.  If patient continues to tolerate this dosing, may try to increase to 720 mg every 12 hours.  Return to clinic in 4 weeks for further evaluation.  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  Continue follow-up and evaluation per surgery. 3.  Poor appetite: Significantly improved.  Continue Megace as needed.  Appreciate dietary input.   4.  Abdominal bloating: Resolved.  5.  Intractable nausea: Resolved.  Continue current medication as prescribed.   Approximately 30 minutes spent in discussion of which greater than 50% was consultation.  Patient expressed understanding and was in agreement with  this plan. He also understands that He can call clinic at any time with any questions, concerns, or complaints.   Cancer Staging Metastatic melanoma [facility]) Staging form: Melanoma of the Skin, AJCC 8th Edition - Clinical stage from 07/15/2017: Stage IV (cTX, cN1, cM1c(0)) - Signed by Lloyd Huger, MD on 07/15/2017   Lloyd Huger, MD   02/28/2018 9:07 AM

## 2018-02-24 ENCOUNTER — Inpatient Hospital Stay (HOSPITAL_BASED_OUTPATIENT_CLINIC_OR_DEPARTMENT_OTHER): Payer: Medicare Other | Admitting: Oncology

## 2018-02-24 ENCOUNTER — Other Ambulatory Visit: Payer: Self-pay

## 2018-02-24 ENCOUNTER — Inpatient Hospital Stay: Payer: Medicare Other | Attending: Oncology

## 2018-02-24 VITALS — BP 140/74 | HR 54 | Temp 95.8°F | Resp 18 | Wt 187.9 lb

## 2018-02-24 DIAGNOSIS — C7951 Secondary malignant neoplasm of bone: Secondary | ICD-10-CM | POA: Insufficient documentation

## 2018-02-24 DIAGNOSIS — M129 Arthropathy, unspecified: Secondary | ICD-10-CM | POA: Insufficient documentation

## 2018-02-24 DIAGNOSIS — C439 Malignant melanoma of skin, unspecified: Secondary | ICD-10-CM | POA: Diagnosis not present

## 2018-02-24 DIAGNOSIS — Z8042 Family history of malignant neoplasm of prostate: Secondary | ICD-10-CM | POA: Diagnosis not present

## 2018-02-24 DIAGNOSIS — Z801 Family history of malignant neoplasm of trachea, bronchus and lung: Secondary | ICD-10-CM | POA: Diagnosis not present

## 2018-02-24 DIAGNOSIS — D649 Anemia, unspecified: Secondary | ICD-10-CM | POA: Insufficient documentation

## 2018-02-24 DIAGNOSIS — C799 Secondary malignant neoplasm of unspecified site: Secondary | ICD-10-CM

## 2018-02-24 DIAGNOSIS — Z79899 Other long term (current) drug therapy: Secondary | ICD-10-CM

## 2018-02-24 LAB — CBC WITH DIFFERENTIAL/PLATELET
Basophils Absolute: 0 10*3/uL (ref 0–0.1)
Basophils Relative: 1 %
Eosinophils Absolute: 0.1 10*3/uL (ref 0–0.7)
Eosinophils Relative: 3 %
HEMATOCRIT: 35.7 % — AB (ref 40.0–52.0)
HEMOGLOBIN: 11.8 g/dL — AB (ref 13.0–18.0)
LYMPHS ABS: 0.8 10*3/uL — AB (ref 1.0–3.6)
Lymphocytes Relative: 21 %
MCH: 25.4 pg — AB (ref 26.0–34.0)
MCHC: 33.1 g/dL (ref 32.0–36.0)
MCV: 76.7 fL — ABNORMAL LOW (ref 80.0–100.0)
MONOS PCT: 10 %
Monocytes Absolute: 0.4 10*3/uL (ref 0.2–1.0)
NEUTROS PCT: 65 %
Neutro Abs: 2.6 10*3/uL (ref 1.4–6.5)
Platelets: 215 10*3/uL (ref 150–440)
RBC: 4.65 MIL/uL (ref 4.40–5.90)
RDW: 17.5 % — ABNORMAL HIGH (ref 11.5–14.5)
WBC: 4 10*3/uL (ref 3.8–10.6)

## 2018-02-24 LAB — COMPREHENSIVE METABOLIC PANEL
ALK PHOS: 76 U/L (ref 38–126)
ALT: 52 U/L (ref 17–63)
ANION GAP: 9 (ref 5–15)
AST: 40 U/L (ref 15–41)
Albumin: 3.9 g/dL (ref 3.5–5.0)
BILIRUBIN TOTAL: 0.8 mg/dL (ref 0.3–1.2)
BUN: 15 mg/dL (ref 6–20)
CALCIUM: 8.8 mg/dL — AB (ref 8.9–10.3)
CO2: 23 mmol/L (ref 22–32)
Chloride: 107 mmol/L (ref 101–111)
Creatinine, Ser: 1.24 mg/dL (ref 0.61–1.24)
GFR, EST NON AFRICAN AMERICAN: 58 mL/min — AB (ref >60)
Glucose, Bld: 96 mg/dL (ref 65–99)
Potassium: 4 mmol/L (ref 3.5–5.1)
Sodium: 139 mmol/L (ref 135–145)
TOTAL PROTEIN: 7.2 g/dL (ref 6.5–8.1)

## 2018-02-24 NOTE — Progress Notes (Signed)
Here for follow up.per pt-stated " feeling food "

## 2018-02-25 LAB — THYROID PANEL WITH TSH
Free Thyroxine Index: 1.3 (ref 1.2–4.9)
T3 Uptake Ratio: 26 % (ref 24–39)
T4, Total: 4.9 ug/dL (ref 4.5–12.0)
TSH: 5.34 u[IU]/mL — ABNORMAL HIGH (ref 0.450–4.500)

## 2018-03-02 ENCOUNTER — Telehealth: Payer: Self-pay | Admitting: Pharmacist

## 2018-03-02 NOTE — Telephone Encounter (Signed)
Oral Chemotherapy Pharmacist Encounter  Follow-Up Form  Called patient today to follow up regarding patient's oral chemotherapy medication: Zelboraf  Original Start date of oral chemotherapy: 10/2017  Pt reports a few doses of Zelboraf missed since restarting.  Missed dose(s) attributed to: simply forgetting Reviewed plan for missed doses.   Pt reports the following side effects: None reported. Pt used to have fatigue when he was on the higher dose of Zelboraf and that has improved with the dose reduction.  Recent labs reviewed: CBC/CMP from 02/24/18  New medications?: None reported  Other Issues: None reported  Patient knows to call the office with questions or concerns. Oral Oncology Clinic will continue to follow.  Darl Pikes, PharmD, BCPS Hematology/Oncology Clinical Pharmacist [facility] [place] [facility] [phone]  03/02/2018 1:57 PM

## 2018-03-04 ENCOUNTER — Encounter
Admission: RE | Admit: 2018-03-04 | Discharge: 2018-03-04 | Disposition: A | Discharge status: Home / Self Care | Payer: Medicare Other | Source: Ambulatory Visit | Attending: Oncology | Admitting: Oncology

## 2018-03-04 DIAGNOSIS — C439 Malignant melanoma of skin, unspecified: Secondary | ICD-10-CM | POA: Diagnosis not present

## 2018-03-04 DIAGNOSIS — C799 Secondary malignant neoplasm of unspecified site: Secondary | ICD-10-CM | POA: Diagnosis not present

## 2018-03-04 LAB — GLUCOSE, CAPILLARY: GLUCOSE-CAPILLARY: 79 mg/dL (ref 65–99)

## 2018-03-04 MED ORDER — FLUDEOXYGLUCOSE F - 18 (FDG) INJECTION
10.4000 | Freq: Once | INTRAVENOUS | Status: AC | PRN
  Administered 2018-03-04: 10.4 via INTRAVENOUS

## 2018-03-22 MED FILL — ZELBORAF 240 MG TABS: 240 | 28 days supply | Qty: 112 | Fill #1

## 2018-03-28 NOTE — Progress Notes (Signed)
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  INTERVAL HISTORY: Patient returns to clinic today for further evaluation and discussion of his PET scan results.  He continues to tolerate Zelboraf well without significant side effects. His weakness and fatigue have improved. He has no neurologic complaints. He denies any recent fevers or illnesses. He has no chest pain, cough, or hemoptysis. He denies any nausea, vomiting, constipation, or diarrhea. He has no melena or hematochezia. He has no urinary complaints.  Patient offers no specific complaints today.  REVIEW OF SYSTEMS:   Review of Systems  Constitutional: Negative.  Negative for fever, malaise/fatigue and weight loss.  Respiratory: Negative.  Negative for cough and shortness of breath.   Cardiovascular: Negative.  Negative for chest pain and leg swelling.  Gastrointestinal: Negative.  Negative for abdominal pain, blood in stool, melena, nausea and vomiting.  Genitourinary: Negative.  Negative for dysuria.  Musculoskeletal: Negative.  Negative for myalgias.  Skin: Negative.  Negative for rash.  Neurological: Negative.  Negative for sensory change, focal weakness and weakness.  Psychiatric/Behavioral: Negative.  The patient is not nervous/anxious.     As per HPI. Otherwise, a complete review of systems is negative.  PAST MEDICAL HISTORY: Past Medical History:  Diagnosis Date  . Anemia   . Arthritis   . Cancer ([place])   . Complication of anesthesia   . PONV (postoperative nausea and vomiting)     PAST SURGICAL HISTORY: Past Surgical History:  Procedure Laterality Date  . AXILLARY LYMPH NODE DISSECTION Left 06/26/2017   Procedure: EXCISION OF LEFT AXILLARY MASS;  Surgeon:  Johnathan Hausen, MD;  Location: [facility] OR[facility];  Service: General;  Laterality: Left;  . LAPAROTOMY N/A 10/14/2017   Procedure: EXPLORATORY LAPAROTOMY;  Surgeon: Clayburn Pert, MD;  Location: [facility] OR[facility];  Service: General;  Laterality: N/A;  . VASECTOMY      FAMILY HISTORY: Family History  Problem Relation Age of Onset  . Diabetes Mother   . Hypertension Mother   . Leukemia Father   . Thyroid disease Sister   . Liver disease Maternal Grandmother   . Heart Problems Maternal Grandfather   . Diabetes Maternal Grandfather   . Lung cancer Paternal Grandmother   . Prostate cancer Paternal Grandfather     ADVANCED DIRECTIVES (Y/N):  N  HEALTH MAINTENANCE: Social History   Tobacco Use  . Smoking status: Never Smoker  . Smokeless tobacco: Never Used  Substance Use Topics  . Alcohol use: No  . Drug use: No     Colonoscopy:  PAP:  Bone density:  Lipid panel:  No Known Allergies  Current Outpatient Medications  Medication Sig Dispense Refill  . Multiple Vitamin (MULTIVITAMIN) tablet Take 1 tablet by mouth daily.    . vemurafenib (ZELBORAF) 240 MG tablet Take 2 tablets (480 mg total) by mouth every 12 (twelve) hours. 120 tablet 2  . senna-docusate (SENOKOT-S) 8.6-50 MG tablet Take by mouth.     No current facility-administered medications for this visit.     OBJECTIVE: Vitals:   04/01/18 1045  BP: (!) 148/80  Pulse: (!) 56  Resp: 16  Temp: 98.4 F (36.9 C)     Body mass index is 26.11 kg/m.    ECOG FS:0 - Asymptomatic  General: Well-developed, well-nourished, no acute distress. Eyes: Pink  conjunctiva, anicteric sclera. Lungs: Clear to auscultation bilaterally. Heart: Regular rate and rhythm. No rubs, murmurs, or gallops. Abdomen: Soft, nontender, nondistended. No organomegaly noted, normoactive bowel sounds. Musculoskeletal: No edema, cyanosis, or clubbing. Neuro: Alert, answering all questions appropriately. Cranial nerves grossly intact. Skin: No rashes or petechiae  noted. Psych: Normal affect.   LAB RESULTS:  Lab Results  Component Value Date   NA 140 04/01/2018   K 4.2 04/01/2018   CL 107 04/01/2018   CO2 24 04/01/2018   GLUCOSE 96 04/01/2018   BUN 16 04/01/2018   CREATININE 1.12 04/01/2018   CALCIUM 9.3 04/01/2018   PROT 8.0 04/01/2018   ALBUMIN 4.5 04/01/2018   AST 20 04/01/2018   ALT 15 (L) 04/01/2018   ALKPHOS 89 04/01/2018   BILITOT 1.1 04/01/2018   GFRNONAA >60 04/01/2018   GFRAA >60 04/01/2018    Lab Results  Component Value Date   WBC 5.7 04/01/2018   NEUTROABS 3.9 04/01/2018   HGB 13.2 04/01/2018   HCT 39.6 (L) 04/01/2018   MCV 76.5 (L) 04/01/2018   PLT 246 04/01/2018     STUDIES: No results found.  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  PLAN:    1. 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  Continue Zelboraf at a 50% dose of 480 mg twice per day.  If patient has progression of disease, can increase this dosing to 720 mg every 12 hours.  Return to clinic in 1 month for repeat laboratory work and further evaluation.  Will repeat imaging in approximately August 2019.  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  Continue follow-up and evaluation per surgery. 3.  Poor appetite: Significantly improved.  Continue Megace as needed.  Appreciate dietary input.    Approximately 30 minutes was spent in discussion of which greater than 50% was consultation.  Patient expressed understanding and was in agreement with this plan. He also understands that He can call clinic at any time with any questions, concerns, or complaints.   Cancer Staging Metastatic melanoma [facility]) Staging form: Melanoma of the Skin,  AJCC 8th Edition - Clinical stage from 07/15/2017: Stage IV (cTX, cN1, cM1c(0)) - Signed by Lloyd Huger, MD on 07/15/2017   Lloyd Huger, MD   04/04/2018 8:54 AM

## 2018-04-01 ENCOUNTER — Inpatient Hospital Stay (HOSPITAL_BASED_OUTPATIENT_CLINIC_OR_DEPARTMENT_OTHER): Payer: Medicare Other | Admitting: Oncology

## 2018-04-01 ENCOUNTER — Inpatient Hospital Stay: Payer: Medicare Other | Attending: Oncology

## 2018-04-01 ENCOUNTER — Encounter: Payer: Self-pay | Admitting: Oncology

## 2018-04-01 VITALS — BP 148/80 | HR 56 | Temp 98.4°F | Resp 16 | Wt 187.2 lb

## 2018-04-01 DIAGNOSIS — Z8042 Family history of malignant neoplasm of prostate: Secondary | ICD-10-CM | POA: Insufficient documentation

## 2018-04-01 DIAGNOSIS — C439 Malignant melanoma of skin, unspecified: Secondary | ICD-10-CM

## 2018-04-01 DIAGNOSIS — Z79899 Other long term (current) drug therapy: Secondary | ICD-10-CM

## 2018-04-01 DIAGNOSIS — Z806 Family history of leukemia: Secondary | ICD-10-CM | POA: Insufficient documentation

## 2018-04-01 DIAGNOSIS — M129 Arthropathy, unspecified: Secondary | ICD-10-CM | POA: Insufficient documentation

## 2018-04-01 DIAGNOSIS — K631 Perforation of intestine (nontraumatic): Secondary | ICD-10-CM | POA: Insufficient documentation

## 2018-04-01 DIAGNOSIS — R630 Anorexia: Secondary | ICD-10-CM

## 2018-04-01 DIAGNOSIS — Z801 Family history of malignant neoplasm of trachea, bronchus and lung: Secondary | ICD-10-CM

## 2018-04-01 DIAGNOSIS — C799 Secondary malignant neoplasm of unspecified site: Secondary | ICD-10-CM

## 2018-04-01 DIAGNOSIS — D649 Anemia, unspecified: Secondary | ICD-10-CM | POA: Insufficient documentation

## 2018-04-01 DIAGNOSIS — C7951 Secondary malignant neoplasm of bone: Secondary | ICD-10-CM

## 2018-04-01 LAB — COMPREHENSIVE METABOLIC PANEL WITH GFR
ALT: 15 U/L — ABNORMAL LOW (ref 17–63)
AST: 20 U/L (ref 15–41)
Albumin: 4.5 g/dL (ref 3.5–5.0)
Alkaline Phosphatase: 89 U/L (ref 38–126)
Anion gap: 9 (ref 5–15)
BUN: 16 mg/dL (ref 6–20)
CO2: 24 mmol/L (ref 22–32)
Calcium: 9.3 mg/dL (ref 8.9–10.3)
Chloride: 107 mmol/L (ref 101–111)
Creatinine, Ser: 1.12 mg/dL (ref 0.61–1.24)
GFR calc Af Amer: >60 mL/min
GFR calc non Af Amer: >60 mL/min
Glucose, Bld: 96 mg/dL (ref 65–99)
Potassium: 4.2 mmol/L (ref 3.5–5.1)
Sodium: 140 mmol/L (ref 135–145)
Total Bilirubin: 1.1 mg/dL (ref 0.3–1.2)
Total Protein: 8 g/dL (ref 6.5–8.1)

## 2018-04-01 LAB — CBC WITH DIFFERENTIAL/PLATELET
Basophils Absolute: 0 10*3/uL (ref 0–0.1)
Basophils Relative: 1 %
EOS PCT: 3 %
Eosinophils Absolute: 0.2 10*3/uL (ref 0–0.7)
HEMATOCRIT: 39.6 % — AB (ref 40.0–52.0)
Hemoglobin: 13.2 g/dL (ref 13.0–18.0)
LYMPHS ABS: 1.2 10*3/uL (ref 1.0–3.6)
LYMPHS PCT: 21 %
MCH: 25.5 pg — AB (ref 26.0–34.0)
MCHC: 33.4 g/dL (ref 32.0–36.0)
MCV: 76.5 fL — AB (ref 80.0–100.0)
Monocytes Absolute: 0.4 10*3/uL (ref 0.2–1.0)
Monocytes Relative: 7 %
NEUTROS ABS: 3.9 10*3/uL (ref 1.4–6.5)
Neutrophils Relative %: 68 %
PLATELETS: 246 10*3/uL (ref 150–440)
RBC: 5.18 MIL/uL (ref 4.40–5.90)
RDW: 17.5 % — ABNORMAL HIGH (ref 11.5–14.5)
WBC: 5.7 10*3/uL (ref 3.8–10.6)

## 2018-04-02 LAB — THYROID PANEL WITH TSH
Free Thyroxine Index: 1.5 (ref 1.2–4.9)
T3 Uptake Ratio: 26 % (ref 24–39)
T4, Total: 5.6 ug/dL (ref 4.5–12.0)
TSH: 5.26 u[IU]/mL — AB (ref 0.450–4.500)

## 2018-04-09 DIAGNOSIS — Z85820 Personal history of malignant melanoma of skin: Secondary | ICD-10-CM | POA: Diagnosis not present

## 2018-04-09 DIAGNOSIS — L570 Actinic keratosis: Secondary | ICD-10-CM | POA: Diagnosis not present

## 2018-04-09 DIAGNOSIS — D485 Neoplasm of uncertain behavior of skin: Secondary | ICD-10-CM | POA: Diagnosis not present

## 2018-04-09 DIAGNOSIS — C44529 Squamous cell carcinoma of skin of other part of trunk: Secondary | ICD-10-CM | POA: Diagnosis not present

## 2018-04-09 DIAGNOSIS — Z85828 Personal history of other malignant neoplasm of skin: Secondary | ICD-10-CM | POA: Diagnosis not present

## 2018-04-09 DIAGNOSIS — D0462 Carcinoma in situ of skin of left upper limb, including shoulder: Secondary | ICD-10-CM | POA: Diagnosis not present

## 2018-04-09 DIAGNOSIS — D225 Melanocytic nevi of trunk: Secondary | ICD-10-CM | POA: Diagnosis not present

## 2018-04-09 DIAGNOSIS — L821 Other seborrheic keratosis: Secondary | ICD-10-CM | POA: Diagnosis not present

## 2018-04-09 DIAGNOSIS — D2272 Melanocytic nevi of left lower limb, including hip: Secondary | ICD-10-CM | POA: Diagnosis not present

## 2018-04-19 MED FILL — ZELBORAF 240 MG TABS: 240 | 28 days supply | Qty: 112 | Fill #2

## 2018-04-23 ENCOUNTER — Telehealth: Payer: Self-pay | Admitting: *Deleted

## 2018-04-23 NOTE — Telephone Encounter (Signed)
Pt called to report swelling under his arm. Call returned and vm left with pt regarding moving scheduled f/u appointment from 7/10 to 7/8.

## 2018-04-25 NOTE — Progress Notes (Deleted)
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  INTERVAL HISTORY: Patient returns to clinic today for further evaluation and discussion of his PET scan results.  He continues to tolerate Zelboraf well without significant side effects. His weakness and fatigue have improved. He has no neurologic complaints. He denies any recent fevers or illnesses. He has no chest pain, cough, or hemoptysis. He denies any nausea, vomiting, constipation, or diarrhea. He has no melena or hematochezia. He has no urinary complaints.  Patient offers no specific complaints today.  REVIEW OF SYSTEMS:   Review of Systems  Constitutional: Negative.  Negative for fever, malaise/fatigue and weight loss.  Respiratory: Negative.  Negative for cough and shortness of breath.   Cardiovascular: Negative.  Negative for chest pain and leg swelling.  Gastrointestinal: Negative.  Negative for abdominal pain, blood in stool, melena, nausea and vomiting.  Genitourinary: Negative.  Negative for dysuria.  Musculoskeletal: Negative.  Negative for myalgias.  Skin: Negative.  Negative for rash.  Neurological: Negative.  Negative for sensory change, focal weakness and weakness.  Psychiatric/Behavioral: Negative.  The patient is not nervous/anxious.     As per HPI. Otherwise, a complete review of systems is negative.  PAST MEDICAL HISTORY: Past Medical History:  Diagnosis Date  . Anemia   . Arthritis   . Cancer ([place])   . Complication of anesthesia   . PONV (postoperative nausea and vomiting)     PAST SURGICAL HISTORY: Past Surgical History:  Procedure Laterality Date  . AXILLARY LYMPH NODE DISSECTION Left 06/26/2017   Procedure: EXCISION OF LEFT AXILLARY MASS;  Surgeon:  Johnathan Hausen, MD;  Location: [facility] ORS;  Service: General;  Laterality: Left;  . LAPAROTOMY N/A 10/14/2017   Procedure: EXPLORATORY LAPAROTOMY;  Surgeon: Clayburn Pert, MD;  Location: [facility] ORS;  Service: General;  Laterality: N/A;  . VASECTOMY      FAMILY HISTORY: Family History  Problem Relation Age of Onset  . Diabetes Mother   . Hypertension Mother   . Leukemia Father   . Thyroid disease Sister   . Liver disease Maternal Grandmother   . Heart Problems Maternal Grandfather   . Diabetes Maternal Grandfather   . Lung cancer Paternal Grandmother   . Prostate cancer Paternal Grandfather     ADVANCED DIRECTIVES (Y/N):  N  HEALTH MAINTENANCE: Social History   Tobacco Use  . Smoking status: Never Smoker  . Smokeless tobacco: Never Used  Substance Use Topics  . Alcohol use: No  . Drug use: No     Colonoscopy:  PAP:  Bone density:  Lipid panel:  No Known Allergies  Current Outpatient Medications  Medication Sig Dispense Refill  . Multiple Vitamin (MULTIVITAMIN) tablet Take 1 tablet by mouth daily.    [place] senna-docusate (SENOKOT-S) 8.6-50 MG tablet Take by mouth.    . vemurafenib (ZELBORAF) 240 MG tablet Take 2 tablets (480 mg total) by mouth every 12 (twelve) hours. 120 tablet 2   No current facility-administered medications for this visit.     OBJECTIVE: There were no vitals filed for this visit.   There is no height or weight on file to calculate BMI.    ECOG FS:0 - Asymptomatic  General: Well-developed, well-nourished, no acute distress. Eyes: Pink conjunctiva, anicteric sclera. Lungs: Clear to auscultation bilaterally. Heart: Regular rate  and rhythm. No rubs, murmurs, or gallops. Abdomen: Soft, nontender, nondistended. No organomegaly noted, normoactive bowel sounds. Musculoskeletal: No edema, cyanosis, or clubbing. Neuro: Alert, answering all questions appropriately. Cranial nerves grossly intact. Skin: No rashes or petechiae noted. Psych: Normal  affect.   LAB RESULTS:  Lab Results  Component Value Date   NA 140 04/01/2018   K 4.2 04/01/2018   CL 107 04/01/2018   CO2 24 04/01/2018   GLUCOSE 96 04/01/2018   BUN 16 04/01/2018   CREATININE 1.12 04/01/2018   CALCIUM 9.3 04/01/2018   PROT 8.0 04/01/2018   ALBUMIN 4.5 04/01/2018   AST 20 04/01/2018   ALT 15 (L) 04/01/2018   ALKPHOS 89 04/01/2018   BILITOT 1.1 04/01/2018   GFRNONAA >60 04/01/2018   GFRAA >60 04/01/2018    Lab Results  Component Value Date   WBC 5.7 04/01/2018   NEUTROABS 3.9 04/01/2018   HGB 13.2 04/01/2018   HCT 39.6 (L) 04/01/2018   MCV 76.5 (L) 04/01/2018   PLT 246 04/01/2018     STUDIES: No results found.  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  PLAN:    1. 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  Continue Zelboraf at a 50% dose of 480 mg twice per day.  If patient has progression of disease, can increase this dosing to 720 mg every 12 hours.  Return to clinic in 1 month for repeat laboratory work and further evaluation.  Will repeat imaging in approximately August 2019.  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  Continue follow-up and evaluation per surgery. 3.  Poor appetite: Significantly improved.  Continue Megace as needed.  Appreciate dietary input.    Approximately 30 minutes was spent in discussion of which greater than 50% was consultation.  Patient expressed understanding and was in agreement with this plan. He also understands that He can call clinic at any time with any questions, concerns, or complaints.   Cancer Staging Metastatic melanoma ([facility]) Staging form: Melanoma of the Skin, AJCC 8th Edition -  Clinical stage from 07/15/2017: Stage IV (cTX, cN1, cM1c(0)) - Signed by Finnegan, Timothy J, MD on 07/15/2017   Timothy J Finnegan, MD   04/25/2018 8:15 AM     

## 2018-04-26 ENCOUNTER — Other Ambulatory Visit: Payer: Self-pay

## 2018-04-26 ENCOUNTER — Inpatient Hospital Stay: Payer: Medicare Other | Attending: Oncology

## 2018-04-26 ENCOUNTER — Encounter (INDEPENDENT_AMBULATORY_CARE_PROVIDER_SITE_OTHER): Payer: Self-pay

## 2018-04-26 ENCOUNTER — Inpatient Hospital Stay (HOSPITAL_BASED_OUTPATIENT_CLINIC_OR_DEPARTMENT_OTHER): Payer: Medicare Other | Admitting: Oncology

## 2018-04-26 VITALS — BP 121/72 | HR 84 | Temp 96.6°F | Resp 18 | Wt 191.3 lb

## 2018-04-26 DIAGNOSIS — Z8042 Family history of malignant neoplasm of prostate: Secondary | ICD-10-CM | POA: Insufficient documentation

## 2018-04-26 DIAGNOSIS — D500 Iron deficiency anemia secondary to blood loss (chronic): Secondary | ICD-10-CM | POA: Insufficient documentation

## 2018-04-26 DIAGNOSIS — C773 Secondary and unspecified malignant neoplasm of axilla and upper limb lymph nodes: Secondary | ICD-10-CM

## 2018-04-26 DIAGNOSIS — M129 Arthropathy, unspecified: Secondary | ICD-10-CM | POA: Insufficient documentation

## 2018-04-26 DIAGNOSIS — C439 Malignant melanoma of skin, unspecified: Secondary | ICD-10-CM

## 2018-04-26 DIAGNOSIS — R630 Anorexia: Secondary | ICD-10-CM | POA: Insufficient documentation

## 2018-04-26 DIAGNOSIS — Z801 Family history of malignant neoplasm of trachea, bronchus and lung: Secondary | ICD-10-CM | POA: Diagnosis not present

## 2018-04-26 DIAGNOSIS — R222 Localized swelling, mass and lump, trunk: Secondary | ICD-10-CM | POA: Insufficient documentation

## 2018-04-26 DIAGNOSIS — Z79899 Other long term (current) drug therapy: Secondary | ICD-10-CM | POA: Insufficient documentation

## 2018-04-26 DIAGNOSIS — K631 Perforation of intestine (nontraumatic): Secondary | ICD-10-CM | POA: Diagnosis not present

## 2018-04-26 DIAGNOSIS — C799 Secondary malignant neoplasm of unspecified site: Secondary | ICD-10-CM

## 2018-04-26 DIAGNOSIS — D649 Anemia, unspecified: Secondary | ICD-10-CM | POA: Diagnosis not present

## 2018-04-26 DIAGNOSIS — C7951 Secondary malignant neoplasm of bone: Secondary | ICD-10-CM | POA: Insufficient documentation

## 2018-04-26 LAB — CBC WITH DIFFERENTIAL/PLATELET
Basophils Absolute: 0 10*3/uL (ref 0–0.1)
Basophils Relative: 1 %
EOS ABS: 0.2 10*3/uL (ref 0–0.7)
EOS PCT: 3 %
HCT: 36.3 % — ABNORMAL LOW (ref 40.0–52.0)
HEMOGLOBIN: 12.3 g/dL — AB (ref 13.0–18.0)
Lymphocytes Relative: 15 %
Lymphs Abs: 1 10*3/uL (ref 1.0–3.6)
MCH: 26.1 pg (ref 26.0–34.0)
MCHC: 33.8 g/dL (ref 32.0–36.0)
MCV: 77.2 fL — AB (ref 80.0–100.0)
MONO ABS: 0.5 10*3/uL (ref 0.2–1.0)
MONOS PCT: 7 %
Neutro Abs: 5.4 10*3/uL (ref 1.4–6.5)
Neutrophils Relative %: 76 %
Platelets: 217 10*3/uL (ref 150–440)
RBC: 4.71 MIL/uL (ref 4.40–5.90)
RDW: 17.7 % — ABNORMAL HIGH (ref 11.5–14.5)
WBC: 7.2 10*3/uL (ref 3.8–10.6)

## 2018-04-26 LAB — COMPREHENSIVE METABOLIC PANEL
ALK PHOS: 81 U/L (ref 38–126)
ALT: 13 U/L (ref 0–44)
ANION GAP: 9 (ref 5–15)
AST: 25 U/L (ref 15–41)
Albumin: 4.2 g/dL (ref 3.5–5.0)
BUN: 22 mg/dL (ref 8–23)
CALCIUM: 9 mg/dL (ref 8.9–10.3)
CO2: 21 mmol/L — AB (ref 22–32)
Chloride: 106 mmol/L (ref 98–111)
Creatinine, Ser: 1.42 mg/dL — ABNORMAL HIGH (ref 0.61–1.24)
GFR, EST AFRICAN AMERICAN: 57 mL/min — AB (ref >60)
GFR, EST NON AFRICAN AMERICAN: 49 mL/min — AB (ref >60)
Glucose, Bld: 173 mg/dL — ABNORMAL HIGH (ref 70–99)
Potassium: 3.9 mmol/L (ref 3.5–5.1)
SODIUM: 136 mmol/L (ref 135–145)
Total Bilirubin: 1 mg/dL (ref 0.3–1.2)
Total Protein: 7.4 g/dL (ref 6.5–8.1)

## 2018-04-26 NOTE — Progress Notes (Signed)
Here for follow up. Per pt and friend pt has had large bar of soap area- under left arm pit x 6 wks- per pt hurts w movt of arm ( pressure feeling) pt down paying this concern.

## 2018-04-26 NOTE — Progress Notes (Signed)
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  INTERVAL HISTORY: Patient returns to clinic today as an add-on after noting the mass in his left axilla has enlarged in size over the past several weeks.  Patient states he thought it was larger at his clinic appointment approximately 3 weeks ago, but "forgot" to mention it.  It is also causing him some increased discomfort.  He otherwise feels well. He continues to tolerate Zelboraf well without significant side effects.  He does not complain of weakness or fatigue today.  He has no neurologic complaints. He denies any recent fevers or illnesses. He has no chest pain, cough, or hemoptysis. He denies any nausea, vomiting, constipation, or diarrhea. He has no melena or hematochezia. He has no urinary complaints.  Patient offers no further specific complaints today.  REVIEW OF SYSTEMS:   Review of Systems  Constitutional: Negative.  Negative for fever, malaise/fatigue and weight loss.  Respiratory: Negative.  Negative for cough and shortness of breath.   Cardiovascular: Negative.  Negative for chest pain and leg swelling.  Gastrointestinal: Negative.  Negative for abdominal pain, blood in stool, melena, nausea and vomiting.  Genitourinary: Negative.  Negative for dysuria.  Musculoskeletal: Negative.  Negative for myalgias.  Skin: Negative.  Negative for rash.  Neurological: Negative.  Negative for sensory change, focal weakness and weakness.  Psychiatric/Behavioral: Negative.  The patient is not nervous/anxious.     As per HPI. Otherwise, a complete review of systems is negative.  PAST MEDICAL HISTORY: Past Medical History:  Diagnosis Date  . Anemia   . Arthritis   . Cancer (HCC)   .  Complication of anesthesia   . PONV (postoperative nausea and vomiting)     PAST SURGICAL HISTORY: Past Surgical History:  Procedure Laterality Date  . AXILLARY LYMPH NODE DISSECTION Left 06/26/2017   Procedure: EXCISION OF LEFT AXILLARY MASS;  Surgeon: Martin, Matthew, MD;  Location: [facility] ORS;  Service: General;  Laterality: Left;  . LAPAROTOMY N/A 10/14/2017   Procedure: EXPLORATORY LAPAROTOMY;  Surgeon: Woodham, Charles, MD;  Location: [facility] ORS;  Service: General;  Laterality: N/A;  . VASECTOMY      FAMILY HISTORY: Family History  Problem Relation Age of Onset  . Diabetes Mother   . Hypertension Mother   . Leukemia Father   . Thyroid disease Sister   . Liver disease Maternal Grandmother   . Heart Problems Maternal Grandfather   . Diabetes Maternal Grandfather   . Lung cancer Paternal Grandmother   . Prostate cancer Paternal Grandfather     ADVANCED DIRECTIVES (Y/N):  N  HEALTH MAINTENANCE: Social History   Tobacco Use  . Smoking status: Never Smoker  . Smokeless tobacco: Never Used  Substance Use Topics  . Alcohol use: No  . Drug use: No     Colonoscopy:  PAP:  Bone density:  Lipid panel:  No Known Allergies  Current Outpatient Medications  Medication Sig Dispense Refill  . Multiple Vitamin (MULTIVITAMIN) tablet Take 1 tablet by mouth daily.    . vemurafenib (ZELBORAF) 240 MG tablet Take 2 tablets (480 mg total) by mouth every 12 (twelve) hours. 120 tablet 2  . senna-docusate (SENOKOT-S) 8.6-50 MG tablet Take by mouth.     No current facility-administered medications for this visit.       OBJECTIVE: Vitals:   04/26/18 1329  BP: 121/72  Pulse: 84  Resp: 18  Temp: (!) 96.6 F (35.9 C)     Body mass index is 26.68 kg/m.    ECOG FS:0 - Asymptomatic  General: Well-developed, well-nourished, no acute distress. Eyes: Pink conjunctiva, anicteric sclera. HEENT: Normocephalic, moist mucous membranes, clear oropharnyx. Lungs: Clear to auscultation  bilaterally. Heart: Regular rate and rhythm. No rubs, murmurs, or gallops. Abdomen: Soft, nontender, nondistended. No organomegaly noted, normoactive bowel sounds. Musculoskeletal: No edema, cyanosis, or clubbing. Neuro: Alert, answering all questions appropriately. Cranial nerves grossly intact. Skin: No rashes or petechiae noted. Psych: Normal affect. Lymphatics: Easily palpable left axillary mass approximately 8 to 9 cm. LAB RESULTS:  Lab Results  Component Value Date   NA 136 04/26/2018   K 3.9 04/26/2018   CL 106 04/26/2018   CO2 21 (L) 04/26/2018   GLUCOSE 173 (H) 04/26/2018   BUN 22 04/26/2018   CREATININE 1.42 (H) 04/26/2018   CALCIUM 9.0 04/26/2018   PROT 7.4 04/26/2018   ALBUMIN 4.2 04/26/2018   AST 25 04/26/2018   ALT 13 04/26/2018   ALKPHOS 81 04/26/2018   BILITOT 1.0 04/26/2018   GFRNONAA 49 (L) 04/26/2018   GFRAA 57 (L) 04/26/2018    Lab Results  Component Value Date   WBC 7.2 04/26/2018   NEUTROABS 5.4 04/26/2018   HGB 12.3 (L) 04/26/2018   HCT 36.3 (L) 04/26/2018   MCV 77.2 (L) 04/26/2018   PLT 217 04/26/2018     STUDIES: No results found.  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  PLAN:    1. 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  Continue Zelboraf at a 50% dose of 480 mg twice per day.  If patient has progression of disease, can increase this dosing to 720 mg every 12 hours.  Return to clinic in 1 month for repeat laboratory work and further evaluation.   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  Continue follow-up and evaluation per surgery. 3.  Poor appetite: Significantly improved.  Continue Megace as needed.  Appreciate  dietary input. 4.  Left axillary mass: PET scan results from May 2019 reviewed independently with improvement in size and hypermetabolism of this specific area.  Patient feels like it has enlarged over the last several weeks suggesting possible progression of disease.  Will get a CT scan of the chest to further evaluate any interval change.  If mass is larger, will increase Zelboraf dose as above.  If there are no changes, will continue current dosing and patient will return to clinic in 1 month as previously scheduled.  This plan was explained in detail the patient.  I spent a total of 30 minutes face-to-face with the patient of which greater than 50% of the visit was spent in counseling and coordination of care as detailed above.   Patient expressed understanding and was in agreement with this plan. He also understands that He can call clinic at any time with any questions, concerns, or complaints.   Cancer Staging Metastatic melanoma (HCC) Staging form: Melanoma of the Skin, AJCC 8th Edition - Clinical stage from 07/15/2017: Stage IV (cTX, cN1, cM1c(0)) - Signed by Finnegan, Timothy J, MD on 07/15/2017   Timothy J Finnegan, MD     04/26/2018 2:40 PM     

## 2018-04-27 LAB — THYROID PANEL WITH TSH
Free Thyroxine Index: 1.7 (ref 1.2–4.9)
T3 Uptake Ratio: 29 % (ref 24–39)
T4 TOTAL: 5.7 ug/dL (ref 4.5–12.0)
TSH: 5.44 u[IU]/mL — ABNORMAL HIGH (ref 0.450–4.500)

## 2018-04-28 ENCOUNTER — Ambulatory Visit: Payer: Medicare Other | Admitting: Oncology

## 2018-04-28 ENCOUNTER — Other Ambulatory Visit: Payer: Medicare Other

## 2018-04-30 ENCOUNTER — Ambulatory Visit
Admission: RE | Admit: 2018-04-30 | Discharge: 2018-04-30 | Disposition: A | Discharge status: Home / Self Care | Payer: Medicare Other | Source: Ambulatory Visit | Attending: Oncology | Admitting: Oncology

## 2018-04-30 DIAGNOSIS — K769 Liver disease, unspecified: Secondary | ICD-10-CM | POA: Insufficient documentation

## 2018-04-30 DIAGNOSIS — R222 Localized swelling, mass and lump, trunk: Secondary | ICD-10-CM | POA: Diagnosis not present

## 2018-04-30 DIAGNOSIS — C439 Malignant melanoma of skin, unspecified: Secondary | ICD-10-CM

## 2018-04-30 DIAGNOSIS — R918 Other nonspecific abnormal finding of lung field: Secondary | ICD-10-CM | POA: Diagnosis not present

## 2018-04-30 DIAGNOSIS — C799 Secondary malignant neoplasm of unspecified site: Secondary | ICD-10-CM | POA: Diagnosis not present

## 2018-04-30 MED ORDER — IOPAMIDOL (ISOVUE-300) INJECTION 61%
60.0000 mL | Freq: Once | INTRAVENOUS | Status: AC | PRN
  Administered 2018-04-30: 100 mL via INTRAVENOUS

## 2018-05-03 ENCOUNTER — Telehealth: Payer: Self-pay | Admitting: *Deleted

## 2018-05-03 ENCOUNTER — Inpatient Hospital Stay
Admission: RE | Admit: 2018-05-03 | Discharge: 2018-05-03 | Disposition: A | Discharge status: Home / Self Care | Payer: Medicare Other | Source: Ambulatory Visit | Attending: Radiation Oncology | Admitting: Radiation Oncology

## 2018-05-03 ENCOUNTER — Inpatient Hospital Stay (HOSPITAL_BASED_OUTPATIENT_CLINIC_OR_DEPARTMENT_OTHER): Payer: Medicare Other | Admitting: Oncology

## 2018-05-03 VITALS — BP 127/78 | HR 87 | Temp 96.8°F | Resp 18 | Wt 192.0 lb

## 2018-05-03 DIAGNOSIS — Z79899 Other long term (current) drug therapy: Secondary | ICD-10-CM | POA: Diagnosis not present

## 2018-05-03 DIAGNOSIS — C773 Secondary and unspecified malignant neoplasm of axilla and upper limb lymph nodes: Secondary | ICD-10-CM

## 2018-05-03 DIAGNOSIS — C439 Malignant melanoma of skin, unspecified: Secondary | ICD-10-CM | POA: Diagnosis not present

## 2018-05-03 DIAGNOSIS — C7951 Secondary malignant neoplasm of bone: Secondary | ICD-10-CM

## 2018-05-03 DIAGNOSIS — R630 Anorexia: Secondary | ICD-10-CM

## 2018-05-03 DIAGNOSIS — M129 Arthropathy, unspecified: Secondary | ICD-10-CM

## 2018-05-03 DIAGNOSIS — Z8042 Family history of malignant neoplasm of prostate: Secondary | ICD-10-CM

## 2018-05-03 DIAGNOSIS — K631 Perforation of intestine (nontraumatic): Secondary | ICD-10-CM | POA: Diagnosis not present

## 2018-05-03 DIAGNOSIS — Z801 Family history of malignant neoplasm of trachea, bronchus and lung: Secondary | ICD-10-CM | POA: Diagnosis not present

## 2018-05-03 DIAGNOSIS — C799 Secondary malignant neoplasm of unspecified site: Secondary | ICD-10-CM

## 2018-05-03 DIAGNOSIS — D649 Anemia, unspecified: Secondary | ICD-10-CM

## 2018-05-03 DIAGNOSIS — R222 Localized swelling, mass and lump, trunk: Secondary | ICD-10-CM | POA: Diagnosis not present

## 2018-05-03 DIAGNOSIS — R2232 Localized swelling, mass and lump, left upper limb: Secondary | ICD-10-CM

## 2018-05-03 DIAGNOSIS — D500 Iron deficiency anemia secondary to blood loss (chronic): Secondary | ICD-10-CM | POA: Diagnosis not present

## 2018-05-03 NOTE — Telephone Encounter (Signed)
Patient called to report that the lump under his arm was now have a discharge. Patient advised to come to Symptom Management clinic today at 2 PM. Patient verbalized understanding.

## 2018-05-03 NOTE — Progress Notes (Signed)
NEW PATIENT EVALUATION  Name: Louis Davies  MRN: [id number]  Date:   05/03/2018     DOB: 06/01/1951   This 67 y.o. male patient presents to the clinic for initial evaluation of rapidly progressing left axillary metastatic nodes in patient with known stage IV melanoma.  REFERRING PHYSICIAN: [facility]  CHIEF COMPLAINT: No chief complaint on file.   DIAGNOSIS: There were no encounter diagnoses.   PREVIOUS INVESTIGATIONS:  PET CT and CT scans reviewed Clinical notes reviewed Pathology reviewed  HPI: patient is a 67 year old male with known stage IV malignant melanoma BR AF positive. He does have noted mediastinal and bone lesions. Had been on immunotherapy which was discontinued secondary to intestinal perforation. He is also been recently on Zelboraf at a 50% dose. They have been following a lymph node metastasis where his initial lesion first presented which had significantly responded to therapy. He recentlycomplained of increasing size in this area and repeat CT scan of the chest showed a 5 times enlargement of this mass. I been asked to evaluate the patient for possible palliative ration therapy to this area. The lesion is non-painful although it is affecting some motion in his left upper extremity. He specifically denies any areas of bone pain at this time.his most recent PET scan back in May showed decreased have a metabolic activity in the left iliac lesion as well as improvement in thoracic nodes    PLANNED TREATMENT REGIMEN: palliative radiation therapy to left axilla  PAST MEDICAL HISTORY:  has a past medical history of Anemia, Arthritis, Cancer ([place]), Complication of anesthesia, and PONV (postoperative nausea and vomiting).    PAST SURGICAL HISTORY:  Past Surgical History:  Procedure Laterality Date  . AXILLARY LYMPH NODE DISSECTION Left 06/26/2017   Procedure: EXCISION OF LEFT AXILLARY MASS;  Surgeon: Johnathan Hausen, MD;  Location: [facility];  Service: General;   Laterality: Left;  . LAPAROTOMY N/A 10/14/2017   Procedure: EXPLORATORY LAPAROTOMY;  Surgeon: Clayburn Pert, MD;  Location: [facility] ORS;  Service: General;  Laterality: N/A;  . VASECTOMY      FAMILY HISTORY: family history includes Diabetes in his maternal grandfather and mother; Heart Problems in his maternal grandfather; Hypertension in his mother; Leukemia in his father; Liver disease in his maternal grandmother; Lung cancer in his paternal grandmother; Prostate cancer in his paternal grandfather; Thyroid disease in his sister.  SOCIAL HISTORY:  reports that he has never smoked. He has never used smokeless tobacco. He reports that he does not drink alcohol or use drugs.  ALLERGIES: Patient has no known allergies.  MEDICATIONS:  Current Outpatient Medications  Medication Sig Dispense Refill  . Multiple Vitamin (MULTIVITAMIN) tablet Take 1 tablet by mouth daily.    . vemurafenib (ZELBORAF) 240 MG tablet Take 2 tablets (480 mg total) by mouth every 12 (twelve) hours. 120 tablet 2   No current facility-administered medications for this encounter.     ECOG PERFORMANCE STATUS:  1 - Symptomatic but completely ambulatory  REVIEW OF SYSTEMS:  Patient denies any weight loss, fatigue, weakness, fever, chills or night sweats. Patient denies any loss of vision, blurred vision. Patient denies any ringing  of the ears or hearing loss. No irregular heartbeat. Patient denies heart murmur or history of fainting. Patient denies any chest pain or pain radiating to her upper extremities. Patient denies any shortness of breath, difficulty breathing at night, cough or hemoptysis. Patient denies any swelling in the lower legs. Patient denies any nausea vomiting, vomiting of blood, or  coffee ground material in the vomitus. Patient denies any stomach pain. Patient states has had normal bowel movements no significant constipation or diarrhea. Patient denies any dysuria, hematuria or significant nocturia. Patient  denies any problems walking, swelling in the joints or loss of balance. Patient denies any skin changes, loss of hair or loss of weight. Patient denies any excessive worrying or anxiety or significant depression. Patient denies any problems with insomnia. Patient denies excessive thirst, polyuria, polydipsia. Patient denies any swollen glands, patient denies easy bruising or easy bleeding. Patient denies any recent infections, allergies or URI. Patient "s visual fields have not changed significantly in recent time.    PHYSICAL EXAM: There were no vitals taken for this visit. Patient has extremely large bulky left axillary mass fixed. No evidence of supra clavicular adenopathy is identified. Well-developed well-nourished patient in NAD. HEENT reveals PERLA, EOMI, discs not visualized.  Oral cavity is clear. No oral mucosal lesions are identified. Neck is clear without evidence of cervical or supraclavicular adenopathy. Lungs are clear to A&P. Cardiac examination is essentially unremarkable with regular rate and rhythm without murmur rub or thrill. Abdomen is benign with no organomegaly or masses noted. Motor sensory and DTR levels are equal and symmetric in the upper and lower extremities. Cranial nerves II through XII are grossly intact. Proprioception is intact. No peripheral adenopathy or edema is identified. No motor or sensory levels are noted. Crude visual fields are within normal range.  LABORATORY DATA: pathology reports reviewed    RADIOLOGY RESULTS:PET CT and CT scans reviewed   IMPRESSION: progressive movement stage IV melanoma with large bulky adenopathy in the left axilla in 67 year old male  PLAN: at this time like to go ahead with 5000 cGy in 20 fractions at 2.5 gray per fraction to his left axilla. Risks and benefits of treatment including skin reaction fatigue alteration of blood counts and possible skin breakdown all were described in detail to the patient. I have personally set up  and ordered CT simulation for tomorrow morning based on the rapid progression of this lesion. Patient seems to comprehend my treatment plan well.  I would like to take this opportunity to thank you for allowing me to participate in the care of your patient.Noreene Filbert, MD

## 2018-05-03 NOTE — Progress Notes (Signed)
Symptom Management Consult note [facility]  Telephone:([phone] [phone]  Patient Care Team: Care, [facility] as PCP - General (Family Medicine)   Name of the patient: Louis Davies  [id number]  11-Oct-1951   Date of visit: 05/03/18  Diagnosis-metastatic melanoma  Chief complaint/ Reason for visit-left axillary swelling  Heme/Onc history:  Oncology History   Patient initially  noticed an enlarging nodule in his left axilla approximately in July 2018. Subsequent excisional biopsy revealed metastatic melanoma. Patient had a lesion removed from his left back back in 2016 but otherwise has noted no other problems.  Had Pet Scan in Sept 2019 revealing metastatic disease. BRAF mutation is positive.    MRI of Brain revealed no metastatic disease.   Began with  immunotherapy using ipilumimab and nivolumab every 3 weeks for 4 cycles and then continue with maintenance nivomumab every 2 weeks until progression of disease.  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   D/c immunotherapy (10/2017)  Started on 960 mg Zelboraf (10/2017)  daily BRAF mutation is positive.  Dose reduced to 75m BID in March 2019 d/t side effects including nausea, vomiting, weight loss and abdominal bloating.   [place]  Restarted at reduced dose of 480 mg in May 2019.  Had restaging PET scan may 2019 revealed esponse to therapy, as evidenced by decreased size and hypermetabolism of dominant left axillary mass. Other thoracic nodes are improved and resolved in hypermetabolism as detailed above. 2. Decreased hypermetabolism within indeterminate left iliac lesion  On 04/26/18 patient noted enlarging axiallry mass and was concerned. CT chest ordered revelaing a large left axillary mass that had dramatically increased in size, measuring over 5  times the volume that it did on 03/04/2018. Appearance compatible with rapid growth of malignancy.           Metastatic melanoma ([place]   07/07/2017 Initial Diagnosis    Metastatic melanoma ([place]        Interval history-  Patient presents for evaluation of a cutaneous abscess. Lesion is located in the left axilla. Onset was 3 weeks ago. Symptoms have rapidly progressed. Abscess has associated symptoms of spontaneous drainage and pain. Patient does have previous history of cutaneous abscesses. Patient does not have diabetes.  ECOG FS:1 - Symptomatic but completely ambulatory  Review of systems- Review of Systems  Constitutional: Negative.  Negative for chills, fever, malaise/fatigue and weight loss.  HENT: Negative for congestion and ear pain.   Eyes: Negative.  Negative for blurred vision and double vision.  Respiratory: Negative.  Negative for cough, sputum production and shortness of breath.   Cardiovascular: Negative.  Negative for chest pain, palpitations and leg swelling.  Gastrointestinal: Negative.  Negative for abdominal pain, constipation, diarrhea, nausea and vomiting.  Genitourinary: Negative for dysuria, frequency and urgency.  Musculoskeletal: Negative for back pain and falls.       Large painful left axillary mass  Skin: Negative.  Negative for rash.  Neurological: Negative.  Negative for weakness and headaches.  Endo/Heme/Allergies: Negative.  Does not bruise/bleed easily.  Psychiatric/Behavioral: Negative.  Negative for depression. The patient is not nervous/anxious and does not have insomnia.      Current treatment- Zolberaf 480 mg BID  No Known Allergies   Past Medical History:  Diagnosis Date  . Anemia   . Arthritis   . Cancer ([place]   . Complication of  anesthesia   . PONV (postoperative nausea and vomiting)      Past Surgical History:  Procedure Laterality Date  . AXILLARY LYMPH NODE DISSECTION Left 06/26/2017   Procedure: EXCISION OF LEFT  AXILLARY MASS;  Surgeon: Johnathan Hausen, MD;  Location: [facility] ORS;  Service: General;  Laterality: Left;  . LAPAROTOMY N/A 10/14/2017   Procedure: EXPLORATORY LAPAROTOMY;  Surgeon: Clayburn Pert, MD;  Location: [facility] ORS;  Service: General;  Laterality: N/A;  . VASECTOMY      Social History   Socioeconomic History  . Marital status: Divorced    Spouse name: Not on file  . Number of children: Not on file  . Years of education: Not on file  . Highest education level: Not on file  Occupational History  . Not on file  Social Needs  . Financial resource strain: Not on file  . Food insecurity:    Worry: Not on file    Inability: Not on file  . Transportation needs:    Medical: Not on file    Non-medical: Not on file  Tobacco Use  . Smoking status: Never Smoker  . Smokeless tobacco: Never Used  Substance and Sexual Activity  . Alcohol use: No  . Drug use: No  . Sexual activity: Yes  Lifestyle  . Physical activity:    Days per week: Not on file    Minutes per session: Not on file  . Stress: Not on file  Relationships  . Social connections:    Talks on phone: Not on file    Gets together: Not on file    Attends religious service: Not on file    Active member of club or organization: Not on file    Attends meetings of clubs or organizations: Not on file    Relationship status: Not on file  . Intimate partner violence:    Fear of current or ex partner: Not on file    Emotionally abused: Not on file    Physically abused: Not on file    Forced sexual activity: Not on file  Other Topics Concern  . Not on file  Social History Narrative  . Not on file    Family History  Problem Relation Age of Onset  . Diabetes Mother   . Hypertension Mother   . Leukemia Father   . Thyroid disease Sister   . Liver disease Maternal Grandmother   . Heart Problems Maternal Grandfather   . Diabetes Maternal Grandfather   . Lung cancer Paternal Grandmother   . Prostate cancer Paternal  Grandfather      Current Outpatient Medications:  Marland Kitchen  Multiple Vitamin (MULTIVITAMIN) tablet, Take 1 tablet by mouth daily., Disp: , Rfl:  .  vemurafenib (ZELBORAF) 240 MG tablet, Take 2 tablets (480 mg total) by mouth every 12 (twelve) hours., Disp: 120 tablet, Rfl: 2 .  Oxycodone HCl 10 MG TABS, Take 1 tablet (10 mg total) by mouth every 6 (six) hours as needed., Disp: 60 tablet, Rfl: 0  Physical exam:  Vitals:   05/03/18 1402  BP: 127/78  Pulse: 87  Resp: 18  Temp: (!) 96.8 F (36 C)  TempSrc: Tympanic  Weight: 192 lb (87.1 kg)   Physical Exam  Constitutional: He is oriented to person, place, and time. Vital signs are normal. He appears well-developed and well-nourished.  HENT:  Head: Normocephalic and atraumatic.  Eyes: Pupils are equal, round, and reactive to light.  Neck: Normal range of motion.  Cardiovascular: Normal rate, regular rhythm  and normal heart sounds.  No murmur heard. Pulmonary/Chest: Effort normal and breath sounds normal. He has no wheezes.  Abdominal: Soft. Normal appearance and bowel sounds are normal. He exhibits no distension. There is no tenderness.  Musculoskeletal: Normal range of motion. He exhibits no edema.  Lymphadenopathy:    He has axillary adenopathy.       Left axillary: Lateral adenopathy present.  Large left axiallry mass with ulceration and crusting. No fluctuant head.   Neurological: He is alert and oriented to person, place, and time.  Skin: Skin is warm and dry. No rash noted.  Psychiatric: Judgment normal.     CMP Latest Ref Rng & Units 04/26/2018  Glucose 70 - 99 mg/dL 173(H)  BUN 8 - 23 mg/dL 22  Creatinine 0.61 - 1.24 mg/dL 1.42(H)  Sodium 135 - 145 mmol/L 136  Potassium 3.5 - 5.1 mmol/L 3.9  Chloride 98 - 111 mmol/L 106  CO2 22 - 32 mmol/L 21(L)  Calcium 8.9 - 10.3 mg/dL 9.0  Total Protein 6.5 - 8.1 g/dL 7.4  Total Bilirubin 0.3 - 1.2 mg/dL 1.0  Alkaline Phos 38 - 126 U/L 81  AST 15 - 41 U/L 25  ALT 0 - 44 U/L 13    CBC Latest Ref Rng & Units 04/26/2018  WBC 3.8 - 10.6 K/uL 7.2  Hemoglobin 13.0 - 18.0 g/dL 12.3(L)  Hematocrit 40.0 - 52.0 % 36.3(L)  Platelets 150 - 440 K/uL 217    No images are attached to the encounter.  Ct Chest W Contrast  Result Date: 04/30/2018 CLINICAL DATA:  Metastatic melanoma.  Left axillary mass. EXAM: CT CHEST WITH CONTRAST TECHNIQUE: Multidetector CT imaging of the chest was performed during intravenous contrast administration. CONTRAST:  163m ISOVUE-300 IOPAMIDOL (ISOVUE-300) INJECTION 61% COMPARISON:  PET-CT 03/04/2018 FINDINGS: Cardiovascular: Unremarkable Mediastinum/Nodes: Small bilateral hilar lymph nodes are not pathologically enlarged by size criteria. The left axillary mass is difficult to fully included on today's imaging because of its large size, but the size is approximately 15.7 by 9.0 by 12.0 cm (volume = 890 cm^3) previously this measured 7.5 by 5.7 by 7.4 cm (volume = 170 cm^3). Lungs/Pleura: Stable scarring at the left lung base. Faint stable nodularity is likely benign. This includes a 3 mm right middle lobe calcified nodule which is probably a calcified granuloma. Upper Abdomen: Stable hypodense 1 cm lesion in segment IVa of the liver on image 127/2 and similar lesion in segment V of the liver. Neither was previously hypermetabolic. Musculoskeletal: Thoracic spondylosis. IMPRESSION: 1. The large left axillary mass has dramatically increased in size, measuring over 5 times the volume that it did on 03/04/2018. Appearance compatible with rapid growth of malignancy. 2. Two small hypodense lesions in the liver highly likely to be benign and were not previously hypermetabolic. Both are likely cysts. Electronically Signed   By: WVan ClinesM.D.   On: 04/30/2018 16:27     Assessment and plan- Patient is a 67y.o. male who presents for an enlarging left axillary mass from known metastatic melanoma.  1.  Metastatic melanoma: Currently on 480 mg of Zolberaf twice  daily.  Has had several dose reductions due to side effects.  Currently tolerating this dose well.  Seen by Dr. FGrayland Ormondlast week for enlarging left axillary lymphadenopathy and subsequently a CT of the chest was performed revealing a dramatic growth of known metastatic melanoma.  Consulted Dr. FGrayland Ormond he recommends increasing Zolberaf 480 mg twice daily to 720 mg BID.  Patient will start  this dose tonight.  Additionally consulted Dr. Donella Stade.  Patient has very painful axillary mass with intermittent  bloody drainage.  We will see if he is a candidate for radiation.  Dr. Donella Stade saw patient while in clinic today.  He is scheduled tomorrow for Sim to begin radiation to left axillary lymphadenopathy in hopes to reduce size and pain to the area.  Patient and wife in agreement with plan.   Visit Diagnosis 1. Metastatic melanoma ([place])   2. Axillary mass, left     Patient expressed understanding and was in agreement with this plan. He also understands that He can call clinic at any time with any questions, concerns, or complaints.   Greater than 50% was spent in counseling and coordination of care with this patient including but not limited to discussion of the relevant topics above (See A&P) including, but not limited to diagnosis and management of acute and chronic medical conditions.    Faythe Casa, AGNP-C [facility] [place]- [phone] Pager- [phone] 05/04/2018 11:30 AM

## 2018-05-04 ENCOUNTER — Other Ambulatory Visit: Payer: Self-pay | Admitting: Oncology

## 2018-05-04 ENCOUNTER — Ambulatory Visit
Admission: RE | Admit: 2018-05-04 | Discharge: 2018-05-04 | Disposition: A | Discharge status: Home / Self Care | Payer: Medicare Other | Source: Ambulatory Visit | Attending: Radiation Oncology | Admitting: Radiation Oncology

## 2018-05-04 DIAGNOSIS — Z510 Encounter for antineoplastic radiation therapy: Secondary | ICD-10-CM | POA: Diagnosis not present

## 2018-05-04 DIAGNOSIS — C439 Malignant melanoma of skin, unspecified: Secondary | ICD-10-CM | POA: Diagnosis not present

## 2018-05-04 DIAGNOSIS — C773 Secondary and unspecified malignant neoplasm of axilla and upper limb lymph nodes: Secondary | ICD-10-CM | POA: Diagnosis not present

## 2018-05-04 MED ORDER — OXYCODONE HCL 10 MG PO TABS: 10.0000 mg | ORAL_TABLET | Freq: Four times a day (QID) | ORAL | 0 refills | Status: DC | PRN

## 2018-05-06 ENCOUNTER — Ambulatory Visit
Admission: RE | Admit: 2018-05-06 | Discharge: 2018-05-06 | Disposition: A | Discharge status: Home / Self Care | Payer: Medicare Other | Source: Ambulatory Visit | Attending: Radiation Oncology | Admitting: Radiation Oncology

## 2018-05-06 ENCOUNTER — Telehealth: Payer: Self-pay | Admitting: Oncology

## 2018-05-06 DIAGNOSIS — Z923 Personal history of irradiation: Secondary | ICD-10-CM | POA: Diagnosis not present

## 2018-05-06 DIAGNOSIS — C439 Malignant melanoma of skin, unspecified: Secondary | ICD-10-CM | POA: Diagnosis not present

## 2018-05-06 DIAGNOSIS — C7989 Secondary malignant neoplasm of other specified sites: Secondary | ICD-10-CM | POA: Diagnosis not present

## 2018-05-06 DIAGNOSIS — Z510 Encounter for antineoplastic radiation therapy: Secondary | ICD-10-CM | POA: Diagnosis not present

## 2018-05-06 DIAGNOSIS — C773 Secondary and unspecified malignant neoplasm of axilla and upper limb lymph nodes: Secondary | ICD-10-CM | POA: Diagnosis not present

## 2018-05-06 DIAGNOSIS — Z9221 Personal history of antineoplastic chemotherapy: Secondary | ICD-10-CM | POA: Diagnosis not present

## 2018-05-06 NOTE — Telephone Encounter (Signed)
Patient called and reports that his axillary mass continue to drain.  He started radiation today. Denies any fever, or pain. Chart reviewed patient has melanoma started radiation today.  On Zolberaf  twice daily. Patient was seen by NP Faythe Casa on 05/03/2018.  At that time it was noted that he has intermittent bloody drainage. Discussed with patient that radiation may have worsened the drainage. Suggest a reevaluation tomorrow morning with symptomatic management at the cancer center.  Need wound care education and instruction If he spikes fever he should go to emergency room for evaluation and antibiotics.  Patient voices understanding.

## 2018-05-07 ENCOUNTER — Inpatient Hospital Stay (HOSPITAL_BASED_OUTPATIENT_CLINIC_OR_DEPARTMENT_OTHER): Payer: Medicare Other | Admitting: Oncology

## 2018-05-07 ENCOUNTER — Other Ambulatory Visit: Payer: Self-pay

## 2018-05-07 ENCOUNTER — Telehealth: Payer: Self-pay | Admitting: *Deleted

## 2018-05-07 ENCOUNTER — Ambulatory Visit
Admission: RE | Admit: 2018-05-07 | Discharge: 2018-05-07 | Disposition: A | Discharge status: Home / Self Care | Payer: Medicare Other | Source: Ambulatory Visit | Attending: Radiation Oncology | Admitting: Radiation Oncology

## 2018-05-07 VITALS — BP 108/68 | HR 68 | Temp 96.5°F | Resp 16

## 2018-05-07 DIAGNOSIS — Z8042 Family history of malignant neoplasm of prostate: Secondary | ICD-10-CM | POA: Diagnosis not present

## 2018-05-07 DIAGNOSIS — D62 Acute posthemorrhagic anemia: Secondary | ICD-10-CM

## 2018-05-07 DIAGNOSIS — C773 Secondary and unspecified malignant neoplasm of axilla and upper limb lymph nodes: Secondary | ICD-10-CM | POA: Diagnosis not present

## 2018-05-07 DIAGNOSIS — Z5189 Encounter for other specified aftercare: Secondary | ICD-10-CM

## 2018-05-07 DIAGNOSIS — K631 Perforation of intestine (nontraumatic): Secondary | ICD-10-CM | POA: Diagnosis not present

## 2018-05-07 DIAGNOSIS — M129 Arthropathy, unspecified: Secondary | ICD-10-CM | POA: Diagnosis not present

## 2018-05-07 DIAGNOSIS — C7951 Secondary malignant neoplasm of bone: Secondary | ICD-10-CM | POA: Diagnosis not present

## 2018-05-07 DIAGNOSIS — R222 Localized swelling, mass and lump, trunk: Secondary | ICD-10-CM | POA: Diagnosis not present

## 2018-05-07 DIAGNOSIS — R630 Anorexia: Secondary | ICD-10-CM | POA: Diagnosis not present

## 2018-05-07 DIAGNOSIS — D500 Iron deficiency anemia secondary to blood loss (chronic): Secondary | ICD-10-CM

## 2018-05-07 DIAGNOSIS — C439 Malignant melanoma of skin, unspecified: Secondary | ICD-10-CM

## 2018-05-07 DIAGNOSIS — Z79899 Other long term (current) drug therapy: Secondary | ICD-10-CM

## 2018-05-07 DIAGNOSIS — Z801 Family history of malignant neoplasm of trachea, bronchus and lung: Secondary | ICD-10-CM

## 2018-05-07 DIAGNOSIS — C799 Secondary malignant neoplasm of unspecified site: Secondary | ICD-10-CM

## 2018-05-07 LAB — CBC WITH DIFFERENTIAL/PLATELET
Basophils Absolute: 0.1 10*3/uL (ref 0–0.1)
Basophils Relative: 1 %
EOS ABS: 0.2 10*3/uL (ref 0–0.7)
Eosinophils Relative: 4 %
HCT: 31.9 % — ABNORMAL LOW (ref 40.0–52.0)
HEMOGLOBIN: 10.5 g/dL — AB (ref 13.0–18.0)
LYMPHS ABS: 1.3 10*3/uL (ref 1.0–3.6)
Lymphocytes Relative: 18 %
MCH: 25.7 pg — ABNORMAL LOW (ref 26.0–34.0)
MCHC: 32.9 g/dL (ref 32.0–36.0)
MCV: 78.1 fL — ABNORMAL LOW (ref 80.0–100.0)
MONOS PCT: 10 %
Monocytes Absolute: 0.7 10*3/uL (ref 0.2–1.0)
NEUTROS PCT: 67 %
Neutro Abs: 4.7 10*3/uL (ref 1.4–6.5)
Platelets: 281 10*3/uL (ref 150–440)
RBC: 4.08 MIL/uL — ABNORMAL LOW (ref 4.40–5.90)
RDW: 16.7 % — ABNORMAL HIGH (ref 11.5–14.5)
WBC: 7 10*3/uL (ref 3.8–10.6)

## 2018-05-07 NOTE — Progress Notes (Signed)
Symptom Management Consult note [facility]  [phone] [phone]  Patient Care Team: Care, [facility] as PCP - General (Family Medicine)   Name of the patient: Louis Davies  [id number]  05-30-1951   Date of visit: 05/10/18  Diagnosis- Metastatic melanoma  Chief complaint/ Reason for visit- Bleeding left axilla mass  Heme/Onc history: Patient was last seen in symptom management clinic on 05/03/2018 for left axillary swelling.  He noted rapidly progressing enlarging left axillary swelling over the course of the past 3 weeks.  He noted spontaneous drainage and pain.  The mass was noted to have ulcerations and crusting but no fluctuant head.  His Zolberaf was increased by 1 tablet daily to 720 mg twice daily.  He also was set up to see Dr. Donella Stade for possible radiation to left axilla.   He was seen in the emergency room on 05/06/2018 for bleeding of the left axillary mass.  It was thought to be likely due to irritation from radiation.  They applied a pressure bandage and he was instructed to return to clinic tomorrow and if drainage worsens or fail to improve or if he experience worsening fevers, chest pain or shortness of breath he was instructed to return to the emergency room.    Oncology History   Patient initially  noticed an enlarging nodule in his left axilla approximately in July 2018. Subsequent excisional biopsy revealed metastatic melanoma. Patient had a lesion removed from his left back back in 2016 but otherwise has noted no other problems.  Had Pet Scan in Sept 2019 revealing metastatic disease. BRAF mutation is positive.    MRI of Brain revealed no metastatic disease.   Began with  immunotherapy using ipilumimab and nivolumab every 3 weeks for 4 cycles and then continue with maintenance nivomumab every 2 weeks until progression of disease.  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   D/c immunotherapy (10/2017)  Started on 960 mg Zelboraf (10/2017)  daily BRAF mutation is positive.  Dose reduced to 731m BID in March 2019 d/t side effects including nausea, vomiting, weight loss and abdominal bloating.   [place]  Restarted at reduced dose of 480 mg in May 2019.  Had restaging PET scan may 2019 revealed esponse to therapy, as evidenced by decreased size and hypermetabolism of dominant left axillary mass. Other thoracic nodes are improved and resolved in hypermetabolism as detailed above. 2. Decreased hypermetabolism within indeterminate left iliac lesion  On 04/26/18 patient noted enlarging axiallry mass and was concerned. CT chest ordered revelaing a large left axillary mass that had dramatically increased in size, measuring over 5 times the volume that it did on 03/04/2018. Appearance compatible with rapid growth of malignancy.           [place]astatic [place] ([place]   07/07/2017 Initial Diagnosis    [place]atic [place] ([place]     '  Interval history-     ECOG FS:1 - Symptomatic but completely ambulatory  Review of systems- Review of Systems  Constitutional: Negative.  Negative for chills, fever, malaise/fatigue and weight loss.  HENT: Negative for congestion and ear pain.   Eyes: Negative.  Negative for blurred vision and double vision.  Respiratory: Negative.  Negative for cough, sputum production and shortness of breath.   Cardiovascular: Negative.  Negative for chest pain, palpitations and leg swelling.  Gastrointestinal: Negative.  Negative  for abdominal pain, constipation, diarrhea, nausea and vomiting.  Genitourinary: Negative for dysuria, frequency and urgency.  Musculoskeletal: Negative for back pain and falls.  Skin: Negative.  Negative for rash.       Left axillary skin ulceration/mass  Neurological: Negative.  Negative for weakness and headaches.    Endo/Heme/Allergies: Negative.  Does not bruise/bleed easily.  Psychiatric/Behavioral: Negative.  Negative for depression. The patient is not nervous/anxious and does not have insomnia.      Current treatment- Zelboraf 720 mg BID  No Known Allergies   Past Medical History:  Diagnosis Date  . Anemia   . Arthritis   . Cancer ([place])   . Complication of anesthesia   . PONV (postoperative nausea and vomiting)      Past Surgical History:  Procedure Laterality Date  . AXILLARY LYMPH NODE DISSECTION Left 06/26/2017   Procedure: EXCISION OF LEFT AXILLARY MASS;  Surgeon: Johnathan Hausen, MD;  Location: [facility] ORS;  Service: General;  Laterality: Left;  . LAPAROTOMY N/A 10/14/2017   Procedure: EXPLORATORY LAPAROTOMY;  Surgeon: Clayburn Pert, MD;  Location: [facility] ORS;  Service: General;  Laterality: N/A;  . VASECTOMY      Social History   Socioeconomic History  . Marital status: Divorced    Spouse name: Not on file  . Number of children: Not on file  . Years of education: Not on file  . Highest education level: Not on file  Occupational History  . Not on file  Social Needs  . Financial resource strain: Not on file  . Food insecurity:    Worry: Not on file    Inability: Not on file  . Transportation needs:    Medical: Not on file    Non-medical: Not on file  Tobacco Use  . Smoking status: Never Smoker  . Smokeless tobacco: Never Used  Substance and Sexual Activity  . Alcohol use: No  . Drug use: No  . Sexual activity: Yes  Lifestyle  . Physical activity:    Days per week: Not on file    Minutes per session: Not on file  . Stress: Not on file  Relationships  . Social connections:    Talks on phone: Not on file    Gets together: Not on file    Attends religious service: Not on file    Active member of club or organization: Not on file    Attends meetings of clubs or organizations: Not on file    Relationship status: Not on file  . Intimate partner violence:    Fear  of current or ex partner: Not on file    Emotionally abused: Not on file    Physically abused: Not on file    Forced sexual activity: Not on file  Other Topics Concern  . Not on file  Social History Narrative  . Not on file    Family History  Problem Relation Age of Onset  . Diabetes Mother   . Hypertension Mother   . Leukemia Father   . Thyroid disease Sister   . Liver disease Maternal Grandmother   . Heart Problems Maternal Grandfather   . Diabetes Maternal Grandfather   . Lung cancer Paternal Grandmother   . Prostate cancer Paternal Grandfather      Current Outpatient Medications:  Marland Kitchen  Multiple Vitamin (MULTIVITAMIN) tablet, Take 1 tablet by mouth daily., Disp: , Rfl:  .  Oxycodone HCl 10 MG TABS, Take 1 tablet (10 mg total) by mouth every 6 (six) hours as  needed., Disp: 60 tablet, Rfl: 0 .  vemurafenib (ZELBORAF) 240 MG tablet, Take 2 tablets (480 mg total) by mouth every 12 (twelve) hours., Disp: 120 tablet, Rfl: 2  Physical exam:  Vitals:   05/07/18 1033  BP: 108/68  Pulse: 68  Resp: 16  Temp: (!) 96.5 F (35.8 C)  TempSrc: Tympanic   Physical Exam  Constitutional: He is oriented to person, place, and time. Vital signs are normal. He appears well-developed and well-nourished.  HENT:  Head: Normocephalic and atraumatic.  Eyes: Pupils are equal, round, and reactive to light.  Neck: Normal range of motion.  Cardiovascular: Normal rate, regular rhythm and normal heart sounds.  No murmur heard. Pulmonary/Chest: Effort normal and breath sounds normal. He has no wheezes.  Abdominal: Soft. Normal appearance and bowel sounds are normal. He exhibits no distension. There is no tenderness.  Musculoskeletal: Normal range of motion. He exhibits no edema.  Lymphadenopathy:    He has axillary adenopathy.       Left axillary: Lateral adenopathy present.  Large ulcerating and bleeding mass.   Neurological: He is alert and oriented to person, place, and time.  Skin: Skin is  warm and dry. No rash noted.  Psychiatric: Judgment normal.     CMP Latest Ref Rng & Units 04/26/2018  Glucose 70 - 99 mg/dL 173(H)  BUN 8 - 23 mg/dL 22  Creatinine 0.61 - 1.24 mg/dL 1.42(H)  Sodium 135 - 145 mmol/L 136  Potassium 3.5 - 5.1 mmol/L 3.9  Chloride 98 - 111 mmol/L 106  CO2 22 - 32 mmol/L 21(L)  Calcium 8.9 - 10.3 mg/dL 9.0  Total Protein 6.5 - 8.1 g/dL 7.4  Total Bilirubin 0.3 - 1.2 mg/dL 1.0  Alkaline Phos 38 - 126 U/L 81  AST 15 - 41 U/L 25  ALT 0 - 44 U/L 13   CBC Latest Ref Rng & Units 05/07/2018  WBC 3.8 - 10.6 K/uL 7.0  Hemoglobin 13.0 - 18.0 g/dL 10.5(L)  Hematocrit 40.0 - 52.0 % 31.9(L)  Platelets 150 - 440 K/uL 281    No images are attached to the encounter.  Ct Chest W Contrast  Result Date: 04/30/2018 CLINICAL DATA:  Metastatic melanoma.  Left axillary mass. EXAM: CT CHEST WITH CONTRAST TECHNIQUE: Multidetector CT imaging of the chest was performed during intravenous contrast administration. CONTRAST:  121m ISOVUE-300 IOPAMIDOL (ISOVUE-300) INJECTION 61% COMPARISON:  PET-CT 03/04/2018 FINDINGS: Cardiovascular: Unremarkable Mediastinum/Nodes: Small bilateral hilar lymph nodes are not pathologically enlarged by size criteria. The left axillary mass is difficult to fully included on today's imaging because of its large size, but the size is approximately 15.7 by 9.0 by 12.0 cm (volume = 890 cm^3) previously this measured 7.5 by 5.7 by 7.4 cm (volume = 170 cm^3). Lungs/Pleura: Stable scarring at the left lung base. Faint stable nodularity is likely benign. This includes a 3 mm right middle lobe calcified nodule which is probably a calcified granuloma. Upper Abdomen: Stable hypodense 1 cm lesion in segment IVa of the liver on image 127/2 and similar lesion in segment V of the liver. Neither was previously hypermetabolic. Musculoskeletal: Thoracic spondylosis. IMPRESSION: 1. The large left axillary mass has dramatically increased in size, measuring over 5 times the  volume that it did on 03/04/2018. Appearance compatible with rapid growth of malignancy. 2. Two small hypodense lesions in the liver highly likely to be benign and were not previously hypermetabolic. Both are likely cysts. Electronically Signed   By: WVan ClinesM.D.   On: 04/30/2018  16:27     Assessment and plan- Patient is a 67 y.o. male who presents for left axillary bleeding ulcerating mass.  1. Metastatic melanoma: Recently Zolberaf dose was increased from 480 mg to 720 mg twice daily d/t enlarging left axillary mass.  Most recent CT scan showed significant increase of growth of known metastatic melanoma.  He was referred to Dr. Donella Stade for possible radiation to the left axilla due to increasing size and pain.  He was scheduled next day for simulation and has since then began radiation.   2. Bleeding left axilla ulcerating mass: Patient received pressure dressing last night in the emergency room.  Mass continues to bleed.  Will check blood counts today.  We will redress and assess for infection today.  He is afebrile.   No signs of infection.  Attempted to have patient seen by wound care clinic.  Unfortunately, they are unable to see him today and next available appointment is not until mid August.   Labs today reveal a 2 g drop in hemoglobin.  Patient notified of findings.  He is not symptomatic.  Hemoglobin 10.5.  Previous 12.3 (04/26/18).  We redressed and were able to apply a new pressure dressing to mass.  Patient denied pain.  Given supplies for redressing.  He was instructed to call clinic or return to the emergency room if bleeding continued or was worse.  Patient understands signs and symptoms of a low hemoglobin. He will return to clinic on Monday for repeat lab draw.  Patient also instructed to keep area clean and dry to prevent secondary infection.     Visit Diagnosis 1. Wound check, abscess   2. Acute blood loss anemia     Patient expressed understanding and was in  agreement with this plan. He also understands that He can call clinic at any time with any questions, concerns, or complaints.   Greater than 50% was spent in counseling and coordination of care with this patient including but not limited to discussion of the relevant topics above (See A&P) including, but not limited to diagnosis and management of acute and chronic medical conditions.    Faythe Casa, AGNP-C [facility] [place]- [phone] Pager- [phone] 05/10/2018 8:27 AM

## 2018-05-07 NOTE — Telephone Encounter (Signed)
Wife called to report that patient was being seen in the ED at [facility] for the wound under his arm. She reports that the wound would not stop draining. She wanted clinic advice on how to care for the wound. Writer advised her that she would get discharge instructions from ED. Patient has appointment in radiation today.

## 2018-05-10 ENCOUNTER — Inpatient Hospital Stay: Payer: Medicare Other

## 2018-05-10 ENCOUNTER — Ambulatory Visit
Admission: RE | Admit: 2018-05-10 | Discharge: 2018-05-10 | Disposition: A | Discharge status: Home / Self Care | Payer: Medicare Other | Source: Ambulatory Visit | Attending: Radiation Oncology | Admitting: Radiation Oncology

## 2018-05-10 ENCOUNTER — Inpatient Hospital Stay
Admission: AD | Admit: 2018-05-10 | Discharge: 2018-05-12 | DRG: 982 | Disposition: A | Discharge status: Home / Self Care | Payer: Medicare Other | Source: Ambulatory Visit | Attending: Internal Medicine | Admitting: Internal Medicine

## 2018-05-10 ENCOUNTER — Inpatient Hospital Stay (HOSPITAL_BASED_OUTPATIENT_CLINIC_OR_DEPARTMENT_OTHER): Payer: Medicare Other | Admitting: Oncology

## 2018-05-10 ENCOUNTER — Other Ambulatory Visit: Payer: Self-pay

## 2018-05-10 VITALS — BP 147/79 | HR 73 | Temp 96.0°F | Resp 18

## 2018-05-10 DIAGNOSIS — D62 Acute posthemorrhagic anemia: Secondary | ICD-10-CM

## 2018-05-10 DIAGNOSIS — C439 Malignant melanoma of skin, unspecified: Secondary | ICD-10-CM | POA: Diagnosis present

## 2018-05-10 DIAGNOSIS — C7951 Secondary malignant neoplasm of bone: Secondary | ICD-10-CM | POA: Diagnosis not present

## 2018-05-10 DIAGNOSIS — C7989 Secondary malignant neoplasm of other specified sites: Secondary | ICD-10-CM | POA: Diagnosis present

## 2018-05-10 DIAGNOSIS — R58 Hemorrhage, not elsewhere classified: Secondary | ICD-10-CM | POA: Diagnosis present

## 2018-05-10 DIAGNOSIS — D500 Iron deficiency anemia secondary to blood loss (chronic): Secondary | ICD-10-CM | POA: Diagnosis not present

## 2018-05-10 DIAGNOSIS — R600 Localized edema: Secondary | ICD-10-CM | POA: Diagnosis not present

## 2018-05-10 DIAGNOSIS — Z79899 Other long term (current) drug therapy: Secondary | ICD-10-CM

## 2018-05-10 DIAGNOSIS — M129 Arthropathy, unspecified: Secondary | ICD-10-CM | POA: Diagnosis not present

## 2018-05-10 DIAGNOSIS — Z801 Family history of malignant neoplasm of trachea, bronchus and lung: Secondary | ICD-10-CM | POA: Diagnosis not present

## 2018-05-10 DIAGNOSIS — C773 Secondary and unspecified malignant neoplasm of axilla and upper limb lymph nodes: Secondary | ICD-10-CM | POA: Diagnosis not present

## 2018-05-10 DIAGNOSIS — M1990 Unspecified osteoarthritis, unspecified site: Secondary | ICD-10-CM | POA: Diagnosis present

## 2018-05-10 DIAGNOSIS — K630 Abscess of intestine: Secondary | ICD-10-CM | POA: Diagnosis not present

## 2018-05-10 DIAGNOSIS — C799 Secondary malignant neoplasm of unspecified site: Secondary | ICD-10-CM

## 2018-05-10 DIAGNOSIS — Z8042 Family history of malignant neoplasm of prostate: Secondary | ICD-10-CM | POA: Diagnosis not present

## 2018-05-10 DIAGNOSIS — R222 Localized swelling, mass and lump, trunk: Secondary | ICD-10-CM

## 2018-05-10 DIAGNOSIS — C4359 Malignant melanoma of other part of trunk: Secondary | ICD-10-CM | POA: Diagnosis not present

## 2018-05-10 DIAGNOSIS — D649 Anemia, unspecified: Secondary | ICD-10-CM | POA: Diagnosis present

## 2018-05-10 LAB — CBC WITH DIFFERENTIAL/PLATELET
Basophils Absolute: 0 10*3/uL (ref 0–0.1)
Basophils Relative: 1 %
Eosinophils Absolute: 0.1 10*3/uL (ref 0–0.7)
Eosinophils Relative: 3 %
HCT: 23.1 % — ABNORMAL LOW (ref 40.0–52.0)
Hemoglobin: 7.8 g/dL — ABNORMAL LOW (ref 13.0–18.0)
Lymphocytes Relative: 20 %
Lymphs Abs: 0.9 10*3/uL — ABNORMAL LOW (ref 1.0–3.6)
MCH: 25.9 pg — AB (ref 26.0–34.0)
MCHC: 33.6 g/dL (ref 32.0–36.0)
MCV: 77.2 fL — ABNORMAL LOW (ref 80.0–100.0)
MONO ABS: 0.4 10*3/uL (ref 0.2–1.0)
MONOS PCT: 8 %
Neutro Abs: 3.1 10*3/uL (ref 1.4–6.5)
Neutrophils Relative %: 68 %
Platelets: 246 10*3/uL (ref 150–440)
RBC: 3 MIL/uL — ABNORMAL LOW (ref 4.40–5.90)
RDW: 16.6 % — AB (ref 11.5–14.5)
WBC: 4.5 10*3/uL (ref 3.8–10.6)

## 2018-05-10 LAB — COMPREHENSIVE METABOLIC PANEL
ALT: 12 U/L (ref 0–44)
ANION GAP: 6 (ref 5–15)
AST: 17 U/L (ref 15–41)
Albumin: 3.7 g/dL (ref 3.5–5.0)
Alkaline Phosphatase: 59 U/L (ref 38–126)
BUN: 25 mg/dL — ABNORMAL HIGH (ref 8–23)
CHLORIDE: 109 mmol/L (ref 98–111)
CO2: 23 mmol/L (ref 22–32)
Calcium: 8.6 mg/dL — ABNORMAL LOW (ref 8.9–10.3)
Creatinine, Ser: 1.42 mg/dL — ABNORMAL HIGH (ref 0.61–1.24)
GFR calc non Af Amer: 49 mL/min — ABNORMAL LOW (ref >60)
GFR, EST AFRICAN AMERICAN: 57 mL/min — AB (ref >60)
Glucose, Bld: 97 mg/dL (ref 70–99)
Potassium: 3.8 mmol/L (ref 3.5–5.1)
SODIUM: 138 mmol/L (ref 135–145)
Total Bilirubin: 0.5 mg/dL (ref 0.3–1.2)
Total Protein: 6.4 g/dL — ABNORMAL LOW (ref 6.5–8.1)

## 2018-05-10 LAB — PREPARE RBC (CROSSMATCH)

## 2018-05-10 LAB — ABO/RH: ABO/RH(D): B NEG

## 2018-05-10 MED ORDER — SODIUM CHLORIDE 0.9% IV SOLUTION
Freq: Once | INTRAVENOUS | Status: AC
  Administered 2018-05-10: 21:00:00 via INTRAVENOUS

## 2018-05-10 MED ORDER — ACETAMINOPHEN 650 MG RE SUPP: 650.0000 mg | Freq: Four times a day (QID) | RECTAL | Status: DC | PRN

## 2018-05-10 MED ORDER — ACETAMINOPHEN 325 MG PO TABS: 650.0000 mg | ORAL_TABLET | Freq: Four times a day (QID) | ORAL | Status: DC | PRN

## 2018-05-10 NOTE — Progress Notes (Addendum)
Family Meeting Note  Advance Directiveyes  Today a meeting took place with the  pateint patient admitted with with acute post hemorrhagic anemia secondary from losing blood vessel/bleeding from left armpit melanoma lesion.  Prognosis stable. Discuss code status patient wants to be full code  Time spent during Odem, MD   17 mins

## 2018-05-10 NOTE — H&P (Signed)
[facility] [place] NAME: Louis Davies    MR#:  [id number]  DATE OF BIRTH:  1951-02-23  DATE OF ADMISSION:  05/10/2018  PRIMARY CARE PHYSICIAN: Care, [facility]   REQUESTING/REFERRING PHYSICIAN: [name]  CHIEF COMPLAINT:  bleeding from the left armpit  HISTORY OF PRESENT ILLNESS:  Louis Davies  is a 67 y.o. male with a known history of malignant melanoma with enlarging nodule in the left axilla in July 2018. Patient underwent exceptional biopsy that revealed metastatic malignant melanoma. He is currently under oral chemotherapy. Patient started noticing increasing enlargement of the left armpit lymph node about six weeks ago. It enlarged to point it started spontaneously bleeding and patient's hemoglobin dropped from 12.3 on 7/8 to 7.8 on 05/10/2018  pt was seen at the cancer center still losing significant amount of blood. Dr. Grayland Ormond has spoken with Dr. Delana Meyer about possible embolization of the bleeding vessel.  pt is being admitted for further evaluation and management denies any chest pain shortness of breath  PAST MEDICAL HISTORY:   Past Medical History:  Diagnosis Date  . Anemia   . Arthritis   . Cancer ([place])   . Complication of anesthesia   . PONV (postoperative nausea and vomiting)     PAST SURGICAL HISTOIRY:   Past Surgical History:  Procedure Laterality Date  . AXILLARY LYMPH NODE DISSECTION Left 06/26/2017   Procedure: EXCISION OF LEFT AXILLARY MASS;  Surgeon: Johnathan Hausen, MD;  Location: [facility] ORS;  Service: General;  Laterality: Left;  . DIAGNOSTIC LAPAROSCOPY    . LAPAROTOMY N/A 10/14/2017   Procedure: EXPLORATORY LAPAROTOMY;  Surgeon: Clayburn Pert, MD;  Location: [facility] ORS;  Service: General;  Laterality: N/A;  . VASECTOMY      SOCIAL HISTORY:   Social History   Tobacco Use  . Smoking status: Never Smoker  . Smokeless tobacco: Never Used  Substance Use Topics  . Alcohol use: No     FAMILY HISTORY:   Family History  Problem Relation Age of Onset  . Diabetes Mother   . Hypertension Mother   . Leukemia Father   . Thyroid disease Sister   . Liver disease Maternal Grandmother   . Heart Problems Maternal Grandfather   . Diabetes Maternal Grandfather   . Lung cancer Paternal Grandmother   . Prostate cancer Paternal Grandfather     DRUG ALLERGIES:  No Known Allergies  REVIEW OF SYSTEMS:  Review of Systems  Constitutional: Negative for chills, fever and weight loss.  HENT: Negative for ear discharge, ear pain and nosebleeds.   Eyes: Negative for blurred vision, pain and discharge.  Respiratory: Negative for sputum production, shortness of breath, wheezing and stridor.   Cardiovascular: Negative for chest pain, palpitations, orthopnea and PND.  Gastrointestinal: Negative for abdominal pain, diarrhea, nausea and vomiting.  Genitourinary: Negative for frequency and urgency.  Musculoskeletal: Negative for back pain and joint pain.  Neurological: Negative for sensory change, speech change, focal weakness and weakness.  Psychiatric/Behavioral: Negative for depression and hallucinations. The patient is not nervous/anxious.      MEDICATIONS AT HOME:   Prior to Admission medications   Medication Sig Start Date End Date Taking? Authorizing Provider  Multiple Vitamin (MULTIVITAMIN) tablet Take 1 tablet by mouth daily.   Yes [provider]  Oxycodone HCl 10 MG TABS Take 1 tablet (10 mg total) by mouth every 6 (six) hours as needed. 05/04/18  Yes Lloyd Huger, MD  vemurafenib (ZELBORAF) 240 MG  tablet Take 2 tablets (480 mg total) by mouth every 12 (twelve) hours. 02/19/18  Yes Lloyd Huger, MD      VITAL SIGNS:  Blood pressure 132/62, pulse 70, temperature (!) 97.5 F (36.4 C), temperature source Oral, resp. rate 18, SpO2 99 %.  PHYSICAL EXAMINATION:  GENERAL:  67 y.o.-year-old patient lying in the bed with no acute distress.  EYES: Pupils  equal, round, reactive to light and accommodation. No scleral icterus. Extraocular muscles intact.  HEENT: Head atraumatic, normocephalic. Oropharynx and nasopharynx clear.  NECK:  Supple, no jugular venous distention. No thyroid enlargement, no tenderness.  LUNGS: Normal breath sounds bilaterally, no wheezing, rales,rhonchi or crepitation. No use of accessory muscles of respiration.  CARDIOVASCULAR: S1, S2 normal. No murmurs, rubs, or gallops.  ABDOMEN: Soft, nontender, nondistended. Bowel sounds present. No organomegaly or mass.  EXTREMITIES: No pedal edema, cyanosis, or clubbing. Left arm. Dressing present shows large blood clot and losing blood on the dressing NEUROLOGIC: Cranial nerves II through XII are intact. Muscle strength 5/5 in all extremities. Sensation intact. Gait not checked.  PSYCHIATRIC: The patient is alert and oriented x 3.  SKIN: No obvious rash, lesion, or ulcer.   LABORATORY PANEL:   CBC Recent Labs  Lab 05/10/18 1426  WBC 4.5  HGB 7.8*  HCT 23.1*  PLT 246   ------------------------------------------------------------------------------------------------------------------  Chemistries  Recent Labs  Lab 05/10/18 1426  NA 138  K 3.8  CL 109  CO2 23  GLUCOSE 97  BUN 25*  CREATININE 1.42*  CALCIUM 8.6*  AST 17  ALT 12  ALKPHOS 59  BILITOT 0.5   ------------------------------------------------------------------------------------------------------------------  Cardiac Enzymes No results for input(s): TROPONINI in the last 168 hours. ------------------------------------------------------------------------------------------------------------------  RADIOLOGY:  No results found.  EKG:    IMPRESSION AND PLAN:   Louis Davies  is a 67 y.o. male with a known history of malignant melanoma with enlarging nodule in the left axilla in July 2018. Patient underwent exceptional biopsy that revealed metastatic malignant melanoma. He is currently under oral  chemotherapy. Patient started noticing increasing enlargement of the left armpit lymph node about six weeks ago. It enlarged to point it started spontaneously bleeding and patient's hemoglobin dropped from 12.3 on 7/8 to 7.8 on 05/10/2018   1. acute post hemorrhagic anemia secondary to bleeding from left armpit melanoma lesion -hemoglobin dropped from 12.3----7.8 in 2 1/2 weeks. Pressure dressing is not working. -Vascular surgery with consultation with Dr. Delana Meyer -transfuse one unit of blood transfusion  2. malignant melanoma on oral chemotherapy this will be resumed after patient goes home  3. DVT prophylaxis patient is ambulatory. With ongoing bleeding I will do SCD and Ted's  Above was discussed with patient he is in agreement with plan. Above was discussed with Dr. Grayland Ormond   All the records are reviewed and case discussed with ED provider. Management plans discussed with the patient, family and they are in agreement.  CODE STATUS: *full*  TOTAL TIME TAKING CARE OF THIS PATIENT: *45* minutes.    Fritzi Mandes M.D on 05/10/2018 at 7:07 PM  Between 7am to 6pm - Pager - [phone]  After 6pm go to [email or web address] - password [email or web address] [facility]  [facility]  Office  [phone]  CC: Primary care physician; Care, [facility]

## 2018-05-10 NOTE — Progress Notes (Signed)
Symptom Management Consult note [facility]  Telephone:([phone] [phone]  Patient Care Team: Care, [facility] as PCP - General (Family Medicine)   Name of the patient: Louis Davies  [id number]  [birth year]   Date of visit: 05/11/18  Diagnosis-metastatic melanoma  Chief complaint/ Reason for visit- lab and wound check  Heme/Onc history: Patient last seen in symptom management clinic on Friday, 05/07/2018 for bleeding ulcerated left axilla mass.  Patient's labs were checked and his hemoglobin had recently dropped 2 g since previous baseline of 12.3 on 04/26/2018.  He was not symptomatic and wished to remain out of hospital.  No signs of infection.  He was instructed to return to clinic on Monday for radiation, wound check and for lab check.  Oncology History   Patient initially  noticed an enlarging nodule in his left axilla approximately in July 2018. Subsequent excisional biopsy revealed metastatic melanoma. Patient had a lesion removed from his left back back in 2016 but otherwise has noted no other problems.  Had Pet Scan in Sept 2019 revealing metastatic disease. BRAF mutation is positive.    MRI of Brain revealed no metastatic disease.   Began with  immunotherapy using ipilumimab and nivolumab every 3 weeks for 4 cycles and then continue with maintenance nivomumab every 2 weeks until progression of disease.  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   D/c immunotherapy (10/2017)  Started on 960 mg Zelboraf (10/2017)  daily BRAF mutation is positive.  Dose reduced to 763m BID in March 2019 d/t side effects including nausea, vomiting, weight loss and abdominal bloating.   [place]  Restarted at reduced dose of 480 mg in May 2019.  Had restaging PET scan may 2019 revealed esponse to therapy, as evidenced by  decreased size and hypermetabolism of dominant left axillary mass. Other thoracic nodes are improved and resolved in hypermetabolism as detailed above. 2. Decreased hypermetabolism within indeterminate left iliac lesion  On 04/26/18 patient noted enlarging axiallry mass and was concerned. CT chest ordered revelaing a large left axillary mass that had dramatically increased in size, measuring over 5 times the volume that it did on 03/04/2018. Appearance compatible with rapid growth of malignancy.           [place]   07/07/2017 Initial Diagnosis    [place]       Interval history-  Patient presents for wound check.  Patient has a ulceration due to malignant melanoma wound which is located on the left axilla(e).  Current symptoms: pain: mild drainage: bloody. Symptoms began 5 days ago.  Pain is rated 1/10.  Interventions to date: dressing changed 6 times per day X 5 days ago.  ECOG FS:1 - Symptomatic but completely ambulatory  Review of systems- Review of Systems  Constitutional: Negative.  Negative for chills, fever, malaise/fatigue and weight loss.  HENT: Negative for congestion and ear pain.   Eyes: Negative.  Negative for blurred vision and double vision.  Respiratory: Negative.  Negative for cough, sputum production and shortness of breath.   Cardiovascular: Negative.  Negative for chest pain, palpitations and leg swelling.  Gastrointestinal: Negative.  Negative for abdominal pain, constipation, diarrhea, nausea and vomiting.  Genitourinary: Negative for dysuria, frequency and urgency.  Musculoskeletal: Negative for back pain and falls.  Skin: Negative.  Negative for rash.  Left axillary abscess-dramatically reduced in size.  Continues to drain blood  Neurological: Negative.  Negative for weakness and headaches.  Endo/Heme/Allergies: Negative.  Does not bruise/bleed easily.  Psychiatric/Behavioral: Negative.  Negative for  depression. The patient is not nervous/anxious and does not have insomnia.      Current treatment-Zolberaf 720 mg twice daily  No Known Allergies   Past Medical History:  Diagnosis Date  . Anemia   . Arthritis   . Cancer ([place])   . Complication of anesthesia   . PONV (postoperative nausea and vomiting)      Past Surgical History:  Procedure Laterality Date  . AXILLARY LYMPH NODE DISSECTION Left 06/26/2017   Procedure: EXCISION OF LEFT AXILLARY MASS;  Surgeon: Johnathan Hausen, MD;  Location: [facility] ORS;  Service: General;  Laterality: Left;  . DIAGNOSTIC LAPAROSCOPY    . LAPAROTOMY N/A 10/14/2017   Procedure: EXPLORATORY LAPAROTOMY;  Surgeon: Clayburn Pert, MD;  Location: [facility] ORS;  Service: General;  Laterality: N/A;  . VASECTOMY      Social History   Socioeconomic History  . Marital status: Divorced    Spouse name: Not on file  . Number of children: Not on file  . Years of education: Not on file  . Highest education level: Not on file  Occupational History  . Not on file  Social Needs  . Financial resource strain: Not on file  . Food insecurity:    Worry: Not on file    Inability: Not on file  . Transportation needs:    Medical: Not on file    Non-medical: Not on file  Tobacco Use  . Smoking status: Never Smoker  . Smokeless tobacco: Never Used  Substance and Sexual Activity  . Alcohol use: No  . Drug use: No  . Sexual activity: Yes  Lifestyle  . Physical activity:    Days per week: Not on file    Minutes per session: Not on file  . Stress: Not on file  Relationships  . Social connections:    Talks on phone: Not on file    Gets together: Not on file    Attends religious service: Not on file    Active member of club or organization: Not on file    Attends meetings of clubs or organizations: Not on file    Relationship status: Not on file  . Intimate partner violence:    Fear of current or ex partner: Not on file    Emotionally abused: Not on file     Physically abused: Not on file    Forced sexual activity: Not on file  Other Topics Concern  . Not on file  Social History Narrative  . Not on file    Family History  Problem Relation Age of Onset  . Diabetes Mother   . Hypertension Mother   . Leukemia Father   . Thyroid disease Sister   . Liver disease Maternal Grandmother   . Heart Problems Maternal Grandfather   . Diabetes Maternal Grandfather   . Lung cancer Paternal Grandmother   . Prostate cancer Paternal Grandfather     No current facility-administered medications for this visit.  No current outpatient medications on file.  Facility-Administered Medications Ordered in Other Visits:  .  acetaminophen (TYLENOL) tablet 650 mg, 650 mg, Oral, Q6H PRN **OR** acetaminophen (TYLENOL) suppository 650 mg, 650 mg, Rectal, Q6H PRN, Fritzi Mandes, MD  Physical exam:  Vitals:   05/10/18 1515  BP: (!) 147/79  Pulse: 73  Resp:  18  Temp: (!) 96 F (35.6 C)  TempSrc: Tympanic   Physical Exam  Constitutional: He is oriented to person, place, and time. Vital signs are normal. He appears well-developed and well-nourished.  HENT:  Head: Normocephalic and atraumatic.  Eyes: Pupils are equal, round, and reactive to light.  Neck: Normal range of motion.  Cardiovascular: Normal rate, regular rhythm and normal heart sounds.  No murmur heard. Pulmonary/Chest: Effort normal and breath sounds normal. He has no wheezes.  Abdominal: Soft. Normal appearance and bowel sounds are normal. He exhibits no distension. There is no tenderness.  Musculoskeletal: Normal range of motion. He exhibits no edema.  Lymphadenopathy:    He has axillary adenopathy.       Left axillary: Lateral adenopathy present.  Bleeding ulceration mass  Neurological: He is alert and oriented to person, place, and time.  Skin: Skin is warm and dry. No rash noted. There is pallor.  Psychiatric: Judgment normal.     CMP Latest Ref Rng & Units 05/10/2018  Glucose 70 - 99  mg/dL 97  BUN 8 - 23 mg/dL 25(H)  Creatinine 0.61 - 1.24 mg/dL 1.42(H)  Sodium 135 - 145 mmol/L 138  Potassium 3.5 - 5.1 mmol/L 3.8  Chloride 98 - 111 mmol/L 109  CO2 22 - 32 mmol/L 23  Calcium 8.9 - 10.3 mg/dL 8.6(L)  Total Protein 6.5 - 8.1 g/dL 6.4(L)  Total Bilirubin 0.3 - 1.2 mg/dL 0.5  Alkaline Phos 38 - 126 U/L 59  AST 15 - 41 U/L 17  ALT 0 - 44 U/L 12   CBC Latest Ref Rng & Units 05/11/2018  WBC 3.8 - 10.6 K/uL 3.9  Hemoglobin 13.0 - 18.0 g/dL 8.3(L)  Hematocrit 40.0 - 52.0 % 24.1(L)  Platelets 150 - 440 K/uL 244    No images are attached to the encounter.  Ct Chest W Contrast  Result Date: 04/30/2018 CLINICAL DATA:  Metastatic melanoma.  Left axillary mass. EXAM: CT CHEST WITH CONTRAST TECHNIQUE: Multidetector CT imaging of the chest was performed during intravenous contrast administration. CONTRAST:  153m ISOVUE-300 IOPAMIDOL (ISOVUE-300) INJECTION 61% COMPARISON:  PET-CT 03/04/2018 FINDINGS: Cardiovascular: Unremarkable Mediastinum/Nodes: Small bilateral hilar lymph nodes are not pathologically enlarged by size criteria. The left axillary mass is difficult to fully included on today's imaging because of its large size, but the size is approximately 15.7 by 9.0 by 12.0 cm (volume = 890 cm^3) previously this measured 7.5 by 5.7 by 7.4 cm (volume = 170 cm^3). Lungs/Pleura: Stable scarring at the left lung base. Faint stable nodularity is likely benign. This includes a 3 mm right middle lobe calcified nodule which is probably a calcified granuloma. Upper Abdomen: Stable hypodense 1 cm lesion in segment IVa of the liver on image 127/2 and similar lesion in segment V of the liver. Neither was previously hypermetabolic. Musculoskeletal: Thoracic spondylosis. IMPRESSION: 1. The large left axillary mass has dramatically increased in size, measuring over 5 times the volume that it did on 03/04/2018. Appearance compatible with rapid growth of malignancy. 2. Two small hypodense lesions in the  liver highly likely to be benign and were not previously hypermetabolic. Both are likely cysts. Electronically Signed   By: WVan ClinesM.D.   On: 04/30/2018 16:27     Assessment and plan- Patient is a 67y.o. male who presents for continued left axillary bleeding.  1.  Metastatic Melanoma: Recent increase in Zolberaf dose from 480 mg to 720 mg d/t enlarging left axillary mass.  Recent CT scan showed  significant increase of growth of known metastatic melanoma to left axilla.  Started radiation to left axilla last week. RTC on Friday due to spontaneous bleeding of left axillary mass.  Pressure dressing applied with continued drainage.  2.  Bleeding left axillary ulcerating mass: Sent home on Friday with several pressure dressings.  Baseline CBC drawn for comparison.  Hemoglobin dropped from 04/27/2018 at 12.3-10.5 on 05/07/2018.  Returned to clinic today for redraw CBC and found to have a hemoglobin of 7.6.  Patient is asymptomatic from anemia.  Spoke to primary medical oncologist Dr. Finnegan and he recommended consulting vascular surgery.  Spoke to Dr. Schneir and he recommends inpatient admission with possible embolization tonight or tomorrow given the extreme drop in hemoglobin. He will also require PRBC.   Spoke to admitting hospitalist and he agrees for patient to be admitted to medical surgical unit.  Patient in agreement with plan.  Awaiting bed assignment.  Visit Diagnosis 1. Acute blood loss anemia     Patient expressed understanding and was in agreement with this plan. He also understands that He can call clinic at any time with any questions, concerns, or complaints.   Greater than 50% was spent in counseling and coordination of care with this patient including but not limited to discussion of the relevant topics above (See A&P) including, but not limited to diagnosis and management of acute and chronic medical conditions.    Jennifer Burns, AGNP-C [facility] Ascom- [phone] Pager- [phone] 05/11/2018 8:52 AM   

## 2018-05-11 ENCOUNTER — Ambulatory Visit: Payer: Medicare Other

## 2018-05-11 ENCOUNTER — Encounter
Admission: AD | Disposition: A | Discharge status: Home / Self Care | Payer: Self-pay | Source: Ambulatory Visit | Attending: Internal Medicine

## 2018-05-11 ENCOUNTER — Other Ambulatory Visit: Payer: Self-pay | Admitting: Oncology

## 2018-05-11 DIAGNOSIS — C799 Secondary malignant neoplasm of unspecified site: Secondary | ICD-10-CM

## 2018-05-11 DIAGNOSIS — R58 Hemorrhage, not elsewhere classified: Secondary | ICD-10-CM

## 2018-05-11 DIAGNOSIS — C439 Malignant melanoma of skin, unspecified: Secondary | ICD-10-CM

## 2018-05-11 DIAGNOSIS — C4359 Malignant melanoma of other part of trunk: Secondary | ICD-10-CM | POA: Diagnosis not present

## 2018-05-11 DIAGNOSIS — R600 Localized edema: Secondary | ICD-10-CM | POA: Diagnosis not present

## 2018-05-11 HISTORY — PX: EMBOLIZATION: CATH118239

## 2018-05-11 LAB — TYPE AND SCREEN
ABO/RH(D): B NEG
ANTIBODY SCREEN: NEGATIVE
Unit division: 0

## 2018-05-11 LAB — BPAM RBC
BLOOD PRODUCT EXPIRATION DATE: 201908102359
ISSUE DATE / TIME: 201907222030
UNIT TYPE AND RH: 1700

## 2018-05-11 LAB — CBC
HEMATOCRIT: 24.1 % — AB (ref 40.0–52.0)
HEMOGLOBIN: 8.3 g/dL — AB (ref 13.0–18.0)
MCH: 26.9 pg (ref 26.0–34.0)
MCHC: 34.4 g/dL (ref 32.0–36.0)
MCV: 78.1 fL — AB (ref 80.0–100.0)
Platelets: 244 10*3/uL (ref 150–440)
RBC: 3.08 MIL/uL — AB (ref 4.40–5.90)
RDW: 16.2 % — ABNORMAL HIGH (ref 11.5–14.5)
WBC: 3.9 10*3/uL (ref 3.8–10.6)

## 2018-05-11 LAB — THYROID PANEL WITH TSH
Free Thyroxine Index: 1.3 (ref 1.2–4.9)
T3 Uptake Ratio: 28 % (ref 24–39)
T4, Total: 4.6 ug/dL (ref 4.5–12.0)
TSH: 9.35 u[IU]/mL — AB (ref 0.450–4.500)

## 2018-05-11 SURGERY — UPPER EXTREMITY INTERVENTION
Anesthesia: Moderate Sedation | Laterality: Left

## 2018-05-11 SURGERY — EMBOLIZATION
Anesthesia: Moderate Sedation | Laterality: Left

## 2018-05-11 MED ORDER — MIDAZOLAM HCL 2 MG/2ML IJ SOLN
INTRAMUSCULAR | Status: DC | PRN
  Administered 2018-05-11: 0.5 mg via INTRAVENOUS
  Administered 2018-05-11: 2 mg via INTRAVENOUS

## 2018-05-11 MED ORDER — FENTANYL CITRATE (PF) 100 MCG/2ML IJ SOLN
INTRAMUSCULAR | Status: DC | PRN
  Administered 2018-05-11: 25 ug via INTRAVENOUS
  Administered 2018-05-11: 50 ug via INTRAVENOUS

## 2018-05-11 MED ORDER — PROMETHAZINE HCL 25 MG/ML IJ SOLN
12.5000 mg | Freq: Once | INTRAMUSCULAR | Status: AC
  Administered 2018-05-11: 12.5 mg via INTRAVENOUS

## 2018-05-11 MED ORDER — ONDANSETRON HCL 4 MG/2ML IJ SOLN
4.0000 mg | Freq: Once | INTRAMUSCULAR | Status: AC
  Administered 2018-05-11: 4 mg via INTRAVENOUS

## 2018-05-11 MED ORDER — FENTANYL CITRATE (PF) 100 MCG/2ML IJ SOLN
INTRAMUSCULAR | Status: AC
  Filled 2018-05-11: qty 2

## 2018-05-11 MED ORDER — ONDANSETRON HCL 4 MG/2ML IJ SOLN: 4.0000 mg | INTRAMUSCULAR | Status: DC | PRN

## 2018-05-11 MED ORDER — ONDANSETRON HCL 4 MG/2ML IJ SOLN
INTRAMUSCULAR | Status: AC
  Administered 2018-05-11: 4 mg via INTRAVENOUS
  Filled 2018-05-11: qty 2

## 2018-05-11 MED ORDER — CEFAZOLIN SODIUM-DEXTROSE 2-4 GM/100ML-% IV SOLN
2.0000 g | INTRAVENOUS | Status: AC
  Administered 2018-05-11: 2 g via INTRAVENOUS

## 2018-05-11 MED ORDER — CEFAZOLIN SODIUM-DEXTROSE 1-4 GM/50ML-% IV SOLN
INTRAVENOUS | Status: AC
  Filled 2018-05-11: qty 100

## 2018-05-11 MED ORDER — ONDANSETRON HCL 4 MG/2ML IJ SOLN
INTRAMUSCULAR | Status: AC
Start: 2018-05-11 — End: ?
  Filled 2018-05-11: qty 2

## 2018-05-11 MED ORDER — IOPAMIDOL (ISOVUE-300) INJECTION 61%
INTRAVENOUS | Status: DC | PRN
  Administered 2018-05-11: 50 mL via INTRA_ARTERIAL

## 2018-05-11 MED ORDER — PROMETHAZINE HCL 25 MG/ML IJ SOLN
INTRAMUSCULAR | Status: AC
  Filled 2018-05-11: qty 1

## 2018-05-11 MED ORDER — CEFAZOLIN SODIUM-DEXTROSE 2-4 GM/100ML-% IV SOLN
INTRAVENOUS | Status: AC
  Filled 2018-05-11: qty 100

## 2018-05-11 MED ORDER — MIDAZOLAM HCL 5 MG/5ML IJ SOLN
INTRAMUSCULAR | Status: AC
  Filled 2018-05-11: qty 5

## 2018-05-11 MED ORDER — PROMETHAZINE HCL 25 MG/ML IJ SOLN
12.5000 mg | Freq: Four times a day (QID) | INTRAMUSCULAR | Status: DC | PRN
Start: 2018-05-11 — End: 2018-05-12

## 2018-05-11 MED ORDER — HEPARIN SODIUM (PORCINE) 1000 UNIT/ML IJ SOLN
INTRAMUSCULAR | Status: AC
  Filled 2018-05-11: qty 1

## 2018-05-11 MED ORDER — LIDOCAINE HCL (PF) 1 % IJ SOLN
INTRAMUSCULAR | Status: AC
  Filled 2018-05-11: qty 30

## 2018-05-11 MED ORDER — HEPARIN (PORCINE) IN NACL 1000-0.9 UT/500ML-% IV SOLN
INTRAVENOUS | Status: AC
  Filled 2018-05-11: qty 1000

## 2018-05-11 SURGICAL SUPPLY — 30 items
CATH BEACON 5 .035 100 H1 TIP (CATHETERS) ×3 IMPLANT
CATH G 5FX100 (CATHETERS) ×3 IMPLANT
CATH LANTERN 025 150CM 45TIP (MICROCATHETER) ×3 IMPLANT
CATH MICROCATH PRGRT 2.8F 130 (MICROCATHETER) ×2 IMPLANT
CATH PIG 70CM (CATHETERS) ×6 IMPLANT
CATH VERT 5FR 125CM (CATHETERS) ×3 IMPLANT
COIL 400 COMPLEX STD 3X20CM (Vascular Products) ×3 IMPLANT
COIL 400 COMPLEX STD 4X20CM (Vascular Products) ×3 IMPLANT
COIL 400 COMPLEX STD 5X12CM (Vascular Products) ×3 IMPLANT
COIL 400 COMPLEX STD 5X30CM (Vascular Products) ×3 IMPLANT
DEVICE SAFEGUARD 24CM (GAUZE/BANDAGES/DRESSINGS) ×3 IMPLANT
DEVICE STARCLOSE SE CLOSURE (Vascular Products) ×3 IMPLANT
DEVICE TORQUE .025-.038 (MISCELLANEOUS) ×3 IMPLANT
GLIDECATH ANGLED 4FR 120CM (CATHETERS) ×3 IMPLANT
GUIDEWIRE ANGLED .035X260CM (WIRE) ×3 IMPLANT
GUIDEWIRE PFTE-COATED .018X300 (WIRE) ×3 IMPLANT
HANDLE DETACHMENT COIL (MISCELLANEOUS) ×3 IMPLANT
MICROCATH PROGREAT 2.8F 130CM (MICROCATHETER) ×6
NEEDLE ENTRY 21GA 7CM ECHOTIP (NEEDLE) ×3 IMPLANT
NEEDLE PERC 18GX7CM (NEEDLE) ×3 IMPLANT
PACK ANGIOGRAPHY (CUSTOM PROCEDURE TRAY) ×3 IMPLANT
SET INTRO CAPELLA COAXIAL (SET/KITS/TRAYS/PACK) ×3 IMPLANT
SHEATH BRITE TIP 5FRX11 (SHEATH) ×3 IMPLANT
SHEATH BRITE TIP 6FRX11 (SHEATH) ×3 IMPLANT
SHEATH GUIDING CAROTID 6FRX90 (SHEATH) ×3 IMPLANT
TUBING CIL FLEX 10 FLL-RA (TUBING) ×3 IMPLANT
VALVE CHECKFLO PERFORMER (SHEATH) ×3 IMPLANT
WIRE AQUATRACK .035X260CM (WIRE) ×3 IMPLANT
WIRE J 3MM .035X145CM (WIRE) ×3 IMPLANT
WIRE RUNTHROUGH .014X300CM (WIRE) ×3 IMPLANT

## 2018-05-11 NOTE — Op Note (Signed)
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  Arch aortogram  2.  Left upper extremity arteriography  3.  Third order catheter placement into the subscapular artery with coil embolization  4.  Third order catheter placement into the lateral thoracic artery with coil embolization  5.  Start closed right common femoral    Anesthesia: Conscious sedation was administered by the interventional radiology RN under my direct supervision. IV Versed plus fentanyl were utilized. Continuous ECG, pulse oximetry and blood pressure was monitored throughout the entire procedure. Conscious sedation was administered for a total of 129 minutes.  Sheath: 6 French 90 cm destination sheath right common femoral  Contrast: 50 cc   Fluoroscopy Time: 31.7 minutes  Indications: The patient has been bleeding from an ulcerated metastatic deposit of malignant melanoma.  Over the past several weeks his hemoglobin has dropped over 4 g.  He is undergoing radiation treatments but is now here for coil embolization to stop the hemorrhage.  Risks and benefits were reviewed with the patient all questions were answered patient has agreed to proceed  Procedure:  JAYDON AVINA a 67 y.o. male who was identified and appropriate procedural time out was performed.  The patient was then placed supine on the table and prepped and draped in the usual sterile fashion.  Ultrasound was used to evaluate the right common femoral artery.  It was echolucent and pulsatile indicating it is patent .  An ultrasound image was acquired for the permanent record.  A micropuncture needle was used to access the right common femoral artery under direct ultrasound guidance.  The microwire was then advanced  under fluoroscopic guidance without difficulty followed by the micro-sheath.  A 0.035 J wire was advanced without resistance and a 5Fr sheath was placed.    5 French pigtail catheter was advanced into the a sending aorta and an LAO projection of the aortic arch was obtained.  Aqua track wire was then introduced and the pigtail catheter exchanged for an H1 catheter.  The left subclavian was selected and the catheter and wire were advanced to the mid subclavian and more distal imaging of the left arm was obtained.  Wire was then reintroduced and the 5 [place] sheath was exchanged for a 90 cm destination sheath which was positioned with its tip in the distal one third of the subclavian artery on the left.  Magnified imaging now demonstrated the branches of the subclavian artery that were feeding the tumor.  3 branches were identified the subscapular artery, lateral thoracic artery and the superior thoracic artery.  The first 2 are quite a bit larger and clearly represent more dominant flow into the tumor mass the superior thoracic appears to contribute to a lesser degree.  Using a combination of catheters mostly a Kumpe catheter and a prograde wire and catheter system first the subscapular artery was selected and the catheter system advanced hand-injection of contrast through the prograde and the Kumpe was performed.  A total of 3 coils were selected the first coil was a 3 mm x 20 Ruby coil and then a 4 mm x 20 Ruby coil and lastly a 5 mm x 12 Ruby coil they were deployed simultaneously with small injections of contrast between deployments to verify coil location.  This eliminated flow within the subscapular  artery.  Catheters were pulled back into the subclavian and the origin of the lateral thoracic was identified.  Once again the combination of the Kumpe catheter with the prograde catheter and wire were advanced into the lateral thoracic in this location a single 5 x 30 Ruby coil was deployed completely filling  the lateral thoracic and eliminating flow.  Attention was now turned to the third and final artery which was the superior thoracic.  This artery demonstrated multiple 270 degrees turns beginning almost immediately at the origin and attempts at negotiating the wire and catheters distally into this branch so the coils could safely be placed were unsuccessful.  At this point the patient was becoming increasingly nauseated and began having some heaving and therefore contrary considering the success of the dominant 2 branches that were coiled elected to terminate the procedure.  The catheter was removed the Pinnacle sheath exchanged for an 11 cm standard 6 French sheath oblique view of the right groin was obtained in Star close device deployed.  Findings:   Thoracic arch aortogram: The thoracic arch demonstrates a type I anatomy no evidence of ostial lesions.  Left Upper Extremity: The subclavian axillary and visualized portion of the brachial artery is widely patent without evidence of stricture stenosis or atherosclerotic changes.  There appeared to be 3 branches that are filling directly into the tumor mass the largest of which is the subscapular artery the second largest is the lateral thoracic and then the superior thoracic appears to have a can small contribution as well.  Following selection of these the sub-scapular artery and the lateral thoracic or successfully embolized as described above with a significant reduction in flow to the tumor.    Disposition: Patient was taken to the recovery room in stable condition having tolerated the procedure well.  Belenda Cruise Schnier 05/11/2018,5:48 PM

## 2018-05-11 NOTE — Consult Note (Signed)
[facility] Consult Note  MRN : [id number]  Louis Davies is a 67 y.o. (May 05, 1951) male who presents with chief complaint of bleeding from left the area of the armpit.  History of Present Illness:   I am asked to evaluate the patient by Dr. Posey Pronto.  The patient is a 67 year old gentleman with a history of malignant melanoma and a metastatic nodule in the left axilla.  This was identified in July 2018.  At the time biopsy demonstrated that the lesion was fairly advanced and he was initiated on oral chemotherapy.  Approximately a month to month and a half ago the patient noted increasing size in the left armpit and unfortunately last week he experienced the onset of bleeding from this area.  Hemoglobin was checked at the cancer center July 8 and was noted to be 12.3 but on follow-up visit yesterday it had decreased to 7.8.  I was contacted by Dr. Grayland Ormond and we discussed the potential for embolization for shrinkage of this mass and hopefully cessation of bleeding.  The patient denies any nausea or vomiting.  He denies dizziness with standing.  He denies shortness of breath or chest pain.  Current Meds  Medication Sig  . Multiple Vitamin (MULTIVITAMIN) tablet Take 1 tablet by mouth daily.  . Oxycodone HCl 10 MG TABS Take 1 tablet (10 mg total) by mouth every 6 (six) hours as needed.  . vemurafenib (ZELBORAF) 240 MG tablet Take 2 tablets (480 mg total) by mouth every 12 (twelve) hours.    Past Medical History:  Diagnosis Date  . Anemia   . Arthritis   . Cancer ([place])   . Complication of anesthesia   . PONV (postoperative nausea and vomiting)     Past Surgical History:  Procedure Laterality Date  . AXILLARY LYMPH NODE DISSECTION Left 06/26/2017   Procedure: EXCISION OF LEFT AXILLARY MASS;  Surgeon: Johnathan Hausen, MD;  Location: [facility] ORS;  Service: General;  Laterality: Left;  . DIAGNOSTIC LAPAROSCOPY    . LAPAROTOMY N/A 10/14/2017   Procedure:  EXPLORATORY LAPAROTOMY;  Surgeon: Clayburn Pert, MD;  Location: [facility] ORS;  Service: General;  Laterality: N/A;  . VASECTOMY      Social History Social History   Tobacco Use  . Smoking status: Never Smoker  . Smokeless tobacco: Never Used  Substance Use Topics  . Alcohol use: No  . Drug use: No    Family History Family History  Problem Relation Age of Onset  . Diabetes Mother   . Hypertension Mother   . Leukemia Father   . Thyroid disease Sister   . Liver disease Maternal Grandmother   . Heart Problems Maternal Grandfather   . Diabetes Maternal Grandfather   . Lung cancer Paternal Grandmother   . Prostate cancer Paternal Grandfather   No family history of bleeding/clotting disorders, porphyria or autoimmune disease   No Known Allergies   REVIEW OF SYSTEMS (Negative unless checked)  Constitutional: [] Weight loss  [] Fever  [] Chills Cardiac: [] Chest pain   [] Chest pressure   [] Palpitations   [] Shortness of breath when laying flat   [] Shortness of breath at rest   [] Shortness of breath with exertion. Vascular:  [] Pain in legs with walking   [] Pain in legs at rest   [] Pain in legs when laying flat   [] Claudication   [] Pain in feet when walking  [] Pain in feet at rest  [] Pain in feet when laying flat   [] History of DVT   [] Phlebitis   []   Swelling in legs   [] Varicose veins   [] Non-healing ulcers Pulmonary:   [] Uses home oxygen   [] Productive cough   [] Hemoptysis   [] Wheeze  [] COPD   [] Asthma Neurologic:  [] Dizziness  [] Blackouts   [] Seizures   [] History of stroke   [] History of TIA  [] Aphasia   [] Temporary blindness   [] Dysphagia   [] Weakness or numbness in arms   [] Weakness or numbness in legs Musculoskeletal:  [] Arthritis   [] Joint swelling   [] Joint pain   [] Low back pain Hematologic:  [] Easy bruising  [] Easy bleeding   [] Hypercoagulable state   [] Anemic  [] Hepatitis Gastrointestinal:  [] Blood in stool   [] Vomiting blood  [] Gastroesophageal reflux/heartburn   [] Difficulty  swallowing. Genitourinary:  [] Chronic kidney disease   [] Difficult urination  [] Frequent urination  [] Burning with urination   [] Blood in urine Skin:  [] Rashes   [] Ulcers   [] Wounds Psychological:  [] History of anxiety   []  History of major depression.  Physical Examination  Vitals:   05/10/18 2026 05/10/18 2054 05/11/18 0027 05/11/18 0622  BP: 128/70 (!) 145/72 117/74 114/73  Pulse: 65 64 (!) 58 62  Resp: 16 16 18 18   Temp: 98.4 F (36.9 C) 98.3 F (36.8 C) (!) 97.5 F (36.4 C) 98 F (36.7 C)  TempSrc: Oral Oral Oral Oral  SpO2: 100% 99% 99% 98%  Weight:    185 lb 6.5 oz (84.1 kg)  Height:    5\' 11"  (1.803 m)   Body mass index is 25.86 kg/m. Gen:  WD/WN, NAD Head: [place]/AT, No temporalis wasting. Prominent temp pulse not noted. Ear/Nose/Throat: Hearing grossly intact, nares w/o erythema or drainage, oropharynx w/o Erythema/Exudate Eyes: PERRLA, EOMI.  Neck: Supple, no nuchal rigidity.  No bruit or JVD.  Pulmonary:  Good air movement, no audible wheezing bilaterally.  Cardiac: RRR, precordium is not hyperdynamic. Vascular: Left axillary mass with bloodstained dressing Vessel Right Left  Radial Palpable Palpable  Gastrointestinal: soft, non-distended. No guarding/reflex. Musculoskeletal: M/S 5/5 throughout.  Extremities without ischemic changes.  No deformity or atrophy. No edema. Neurologic: CN 2-12 intact. Pain and light touch intact in extremities.  Symmetrical.  Speech is fluent. Motor exam as listed above. Psychiatric: Judgment intact, Mood & affect appropriate for pt's clinical situation. Dermatologic: No rashes or ulcers noted.  No cellulitis or open wounds.   CBC Lab Results  Component Value Date   WBC 3.9 05/11/2018   HGB 8.3 (L) 05/11/2018   HCT 24.1 (L) 05/11/2018   MCV 78.1 (L) 05/11/2018   PLT 244 05/11/2018    BMET    Component Value Date/Time   NA 138 05/10/2018 1426   K 3.8 05/10/2018 1426   CL 109 05/10/2018 1426   CO2 23 05/10/2018 1426   GLUCOSE  97 05/10/2018 1426   BUN 25 (H) 05/10/2018 1426   CREATININE 1.42 (H) 05/10/2018 1426   CALCIUM 8.6 (L) 05/10/2018 1426   GFRNONAA 49 (L) 05/10/2018 1426   GFRAA 57 (L) 05/10/2018 1426   Estimated Creatinine Clearance: 53 mL/min (A) (by C-G formula based on SCr of 1.42 mg/dL (H)).  COAG No results found for: INR, PROTIME  Radiology Ct Chest W Contrast  Result Date: 04/30/2018 CLINICAL DATA:  Metastatic melanoma.  Left axillary mass. EXAM: CT CHEST WITH CONTRAST TECHNIQUE: Multidetector CT imaging of the chest was performed during intravenous contrast administration. CONTRAST:  122mL ISOVUE-300 IOPAMIDOL (ISOVUE-300) INJECTION 61% COMPARISON:  PET-CT 03/04/2018 FINDINGS: Cardiovascular: Unremarkable Mediastinum/Nodes: Small bilateral hilar lymph nodes are not pathologically enlarged by size criteria.  The left axillary mass is difficult to fully included on today's imaging because of its large size, but the size is approximately 15.7 by 9.0 by 12.0 cm (volume = 890 cm^3) previously this measured 7.5 by 5.7 by 7.4 cm (volume = 170 cm^3). Lungs/Pleura: Stable scarring at the left lung base. Faint stable nodularity is likely benign. This includes a 3 mm right middle lobe calcified nodule which is probably a calcified granuloma. Upper Abdomen: Stable hypodense 1 cm lesion in segment IVa of the liver on image 127/2 and similar lesion in segment V of the liver. Neither was previously hypermetabolic. Musculoskeletal: Thoracic spondylosis. IMPRESSION: 1. The large left axillary mass has dramatically increased in size, measuring over 5 times the volume that it did on 03/04/2018. Appearance compatible with rapid growth of malignancy. 2. Two small hypodense lesions in the liver highly likely to be benign and were not previously hypermetabolic. Both are likely cysts. Electronically Signed   By: Van Clines M.D.   On: 04/30/2018 16:27     Assessment/Plan 1.  Metastatic malignant melanoma with  hemorrhage: The patient should undergo angiography with the hope for embolization.  The risks and benefits of been reviewed alternative therapies such as excision were also discussed.  Even if excision were planned it would be most appropriate to embolize the tumor mass preoperatively.  Given the bleeding difficulty the patient has had over the past 2 weeks he is asked that we move forward with embolization so that he can continue his radiation treatments and chemotherapy.   Hortencia Pilar, MD  05/11/2018 12:07 PM

## 2018-05-11 NOTE — Discharge Summary (Addendum)
[place] at [place] NAME: Louis Davies    MR#:  [id number]  DATE OF BIRTH:  November 13, 1950  DATE OF ADMISSION:  05/10/2018 ADMITTING PHYSICIAN: Saundra Shelling, MD  DATE OF DISCHARGE: 05/12/2018  PRIMARY CARE PHYSICIAN: Care, [facility]    ADMISSION DIAGNOSIS:  Acute blood loss lt axcillan mass  DISCHARGE DIAGNOSIS:  Active Problems:   Anemia   SECONDARY DIAGNOSIS:   Past Medical History:  Diagnosis Date  . Anemia   . Arthritis   . Cancer ([place])   . Complication of anesthesia   . PONV (postoperative nausea and vomiting)     HOSPITAL COURSE:   67 y.o. male with a known history of malignant melanoma who presented with spontaneously bleeding from melanoma and patient's hemoglobin dropped from 12.3 on 7/8 to 7.8 on 05/10/2018   1. Acute post hemorrhagic anemia secondary to bleeding from left armpit melanoma lesion His hemoglobin dropped from 12.3----7.8 in 2 1/2 weeks.  Patient is s/p 1 unit PRBC and underwent thrombectomy.His hemoglobin is stable.   2. malignant melanoma on oral chemotherapy this will be resumed and he will have outpatient follow up with [name]    DISCHARGE CONDITIONS AND DIET:  Stable regular diet  CONSULTS OBTAINED:  Treatment Team:  Katha Cabal, MD  DRUG ALLERGIES:  No Known Allergies  DISCHARGE MEDICATIONS:   Allergies as of 05/12/2018   No Known Allergies     Medication List    TAKE these medications   multivitamin tablet Take 1 tablet by mouth daily.   Oxycodone HCl 10 MG Tabs Take 1 tablet (10 mg total) by mouth every 6 (six) hours as needed.   vemurafenib 240 MG tablet Commonly known as:  ZELBORAF Take 2 tablets (480 mg total) by mouth every 12 (twelve) hours.         Today   CHIEF COMPLAINT:  Bleeding has improved   VITAL SIGNS:  Blood pressure 117/72, pulse 66, temperature 98.5 F (36.9 C), temperature source Oral, resp. rate 20, height 5\' 11"  (1.803 m),  weight 84.1 kg (185 lb 6.5 oz), SpO2 97 %.   REVIEW OF SYSTEMS:  Review of Systems  Constitutional: Negative.  Negative for chills, fever and malaise/fatigue.  HENT: Negative.  Negative for ear discharge, ear pain, hearing loss, nosebleeds and sore throat.   Eyes: Negative.  Negative for blurred vision and pain.  Respiratory: Negative.  Negative for cough, hemoptysis, shortness of breath and wheezing.   Cardiovascular: Negative.  Negative for chest pain, palpitations and leg swelling.  Gastrointestinal: Negative.  Negative for abdominal pain, blood in stool, diarrhea, nausea and vomiting.  Genitourinary: Negative.  Negative for dysuria.  Musculoskeletal: Negative.  Negative for back pain.  Skin: Negative.   Neurological: Negative for dizziness, tremors, speech change, focal weakness, seizures and headaches.  Endo/Heme/Allergies: Negative.  Does not bruise/bleed easily.  Psychiatric/Behavioral: Negative.  Negative for depression, hallucinations and suicidal ideas.     PHYSICAL EXAMINATION:  GENERAL:  67 y.o.-year-old patient lying in the bed with no acute distress.  NECK:  Supple, no jugular venous distention. No thyroid enlargement, no tenderness.  LUNGS: Normal breath sounds bilaterally, no wheezing, rales,rhonchi  No use of accessory muscles of respiration.  CARDIOVASCULAR: S1, S2 normal. No murmurs, rubs, or gallops.  ABDOMEN: Soft, non-tender, non-distended. Bowel sounds present. No organomegaly or mass.  EXTREMITIES: No pedal edema, cyanosis, or clubbing.  PSYCHIATRIC: The patient is alert and oriented x 3.  SKIN: No obvious rash,  lesion, or ulcer.  Has pad placed under axialla  DATA REVIEW:   CBC Recent Labs  Lab 05/12/18 0502  WBC 5.2  HGB 9.0*  HCT 25.8*  PLT 264    Chemistries  Recent Labs  Lab 05/10/18 1426  NA 138  K 3.8  CL 109  CO2 23  GLUCOSE 97  BUN 25*  CREATININE 1.42*  CALCIUM 8.6*  AST 17  ALT 12  ALKPHOS 59  BILITOT 0.5    Cardiac  Enzymes No results for input(s): TROPONINI in the last 168 hours.  Microbiology Results  @MICRORSLT48 @  RADIOLOGY:  No results found.    Allergies as of 05/12/2018   No Known Allergies     Medication List    TAKE these medications   multivitamin tablet Take 1 tablet by mouth daily.   Oxycodone HCl 10 MG Tabs Take 1 tablet (10 mg total) by mouth every 6 (six) hours as needed.   vemurafenib 240 MG tablet Commonly known as:  ZELBORAF Take 2 tablets (480 mg total) by mouth every 12 (twelve) hours.         Management plans discussed with the patient and he is in agreement. Stable for discharge home  Patient should follow up with [name]  CODE STATUS:     Code Status Orders  (From admission, onward)        Start     Ordered   05/10/18 1726  Full code  Continuous     05/10/18 1725    Code Status History    Date Active Date Inactive Code Status Order ID Comments User Context   10/10/2017 1808 10/26/2017 1850 Full Code [id number]  SalaryAvel Peace, MD Inpatient    Advance Directive Documentation     Most Recent Value  Type of Advance Directive  Healthcare Power of Attorney, Living will  Pre-existing out of facility DNR order (yellow form or pink MOST form)  -  "MOST" Form in Place?  -      TOTAL TIME TAKING CARE OF THIS PATIENT: 38 minutes.    Note: This dictation was prepared with Dragon dictation along with smaller phrase technology. Any transcriptional errors that result from this process are unintentional.  Meylin Stenzel M.D on 05/12/2018 at 8:41 AM  Between 7am to 6pm - Pager - [phone] After 6pm go to [email or web address] - Proofreader  [facility]  Office  [phone]  CC: Primary care physician; Care, [facility]

## 2018-05-12 ENCOUNTER — Ambulatory Visit
Admission: RE | Admit: 2018-05-12 | Discharge: 2018-05-12 | Disposition: A | Discharge status: Home / Self Care | Payer: Medicare Other | Source: Ambulatory Visit | Attending: Radiation Oncology | Admitting: Radiation Oncology

## 2018-05-12 ENCOUNTER — Encounter: Payer: Self-pay | Admitting: Vascular Surgery

## 2018-05-12 LAB — CBC
HCT: 25.8 % — ABNORMAL LOW (ref 40.0–52.0)
HEMOGLOBIN: 9 g/dL — AB (ref 13.0–18.0)
MCH: 27.2 pg (ref 26.0–34.0)
MCHC: 34.8 g/dL (ref 32.0–36.0)
MCV: 78.2 fL — ABNORMAL LOW (ref 80.0–100.0)
PLATELETS: 264 10*3/uL (ref 150–440)
RBC: 3.3 MIL/uL — AB (ref 4.40–5.90)
RDW: 16 % — ABNORMAL HIGH (ref 11.5–14.5)
WBC: 5.2 10*3/uL (ref 3.8–10.6)

## 2018-05-12 NOTE — Progress Notes (Signed)
Discharge instructions reviewed with patient.  Understanding was verbalized and all questions were answered.  PAD deflated and removed without complication; guaze and tegaderm applied with no evidence of bleeding.  Patient discharged via wheelchair to Oncology for radiation treatment per previous arrangement.

## 2018-05-12 NOTE — Progress Notes (Signed)
[place] at [place] NAME: Louis Davies    MR#:  [id number]  DATE OF BIRTH:  Feb 27, 1951  SUBJECTIVE:   Doing ok this am  REVIEW OF SYSTEMS:    Review of Systems  Constitutional: Negative for fever, chills weight loss HENT: Negative for ear pain, nosebleeds, congestion, facial swelling, rhinorrhea, neck pain, neck stiffness and ear discharge.   Respiratory: Negative for cough, shortness of breath, wheezing  Cardiovascular: Negative for chest pain, palpitations and leg swelling.  Gastrointestinal: Negative for heartburn, abdominal pain, vomiting, diarrhea or consitpation Genitourinary: Negative for dysuria, urgency, frequency, hematuria Musculoskeletal: Negative for back pain or joint pain Neurological: Negative for dizziness, seizures, syncope, focal weakness,  numbness and headaches.  Hematological: Does not bruise/bleed easily.  Psychiatric/Behavioral: Negative for hallucinations, confusion, dysphoric mood    Tolerating Diet: npo      DRUG ALLERGIES:  No Known Allergies  VITALS:  Blood pressure 117/72, pulse 66, temperature 98.5 F (36.9 C), temperature source Oral, resp. rate 20, height 5\' 11"  (1.803 m), weight 84.1 kg (185 lb 6.5 oz), SpO2 97 %.  PHYSICAL EXAMINATION:  Constitutional: Appears well-developed and well-nourished. No distress. HENT: Normocephalic. Marland Kitchen Oropharynx is clear and moist.  Eyes: Conjunctivae and EOM are normal. PERRLA, no scleral icterus.  Neck: Normal ROM. Neck supple. No JVD. No tracheal deviation. CVS: RRR, S1/S2 +, no murmurs, no gallops, no carotid bruit.  Pulmonary: Effort and breath sounds normal, no stridor, rhonchi, wheezes, rales.  Abdominal: Soft. BS +,  no distension, tenderness, rebound or guarding.  Musculoskeletal: Normal range of motion. No edema and no tenderness.  Has pad under axilla Neuro: Alert. CN 2-12 grossly intact. No focal deficits. Skin: Skin is warm and dry. No rash  noted. Psychiatric: Normal mood and affect.      LABORATORY PANEL:   CBC Recent Labs  Lab 05/12/18 0502  WBC 5.2  HGB 9.0*  HCT 25.8*  PLT 264   ------------------------------------------------------------------------------------------------------------------  Chemistries  Recent Labs  Lab 05/10/18 1426  NA 138  K 3.8  CL 109  CO2 23  GLUCOSE 97  BUN 25*  CREATININE 1.42*  CALCIUM 8.6*  AST 17  ALT 12  ALKPHOS 59  BILITOT 0.5   ------------------------------------------------------------------------------------------------------------------  Cardiac Enzymes No results for input(s): TROPONINI in the last 168 hours. ------------------------------------------------------------------------------------------------------------------  RADIOLOGY:  No results found.   ASSESSMENT AND PLAN:   67 y.o.malewith a known history of malignant melanoma who presented with spontaneously bleeding from melanoma and patient's hemoglobin dropped from 12.3 on 7/8to7.8 on 05/10/2018  1.Acute post hemorrhagic anemia secondary to bleeding from left armpit melanoma lesion His hemoglobin dropped from 12.3----7.8 in 2 1/2 weeks. Patient is s/p 1 unit PRBC. Plan for  Thrombectomy today.His hemoglobin is stable.   2.malignant melanoma on oral chemotherapy this will be resumed and he will have outpatient follow up with [name]     D/w dr schnier   Management plans discussed with the patient and he is in agreement.  CODE STATUS: full  TOTAL TIME TAKING CARE OF THIS PATIENT: 28 minutes.     POSSIBLE D/C today, DEPENDING ON CLINICAL CONDITION.   Lyrical Sowle M.D on 05/12/2018 at 8:41 AM  Between 7am to 6pm - Pager - [phone] After 6pm go to [email or web address] - password [email or web address] [facility]  Office  [phone]  CC: Primary care physician; Care, [facility] Primary  Note: This dictation was prepared with Dragon dictation along with smaller  phrase technology.  Any transcriptional errors that result from this process are unintentional.

## 2018-05-13 ENCOUNTER — Ambulatory Visit
Admission: RE | Admit: 2018-05-13 | Discharge: 2018-05-13 | Disposition: A | Discharge status: Home / Self Care | Payer: Medicare Other | Source: Ambulatory Visit | Attending: Radiation Oncology | Admitting: Radiation Oncology

## 2018-05-13 DIAGNOSIS — C439 Malignant melanoma of skin, unspecified: Secondary | ICD-10-CM | POA: Diagnosis not present

## 2018-05-13 DIAGNOSIS — Z510 Encounter for antineoplastic radiation therapy: Secondary | ICD-10-CM | POA: Diagnosis not present

## 2018-05-13 DIAGNOSIS — C773 Secondary and unspecified malignant neoplasm of axilla and upper limb lymph nodes: Secondary | ICD-10-CM | POA: Diagnosis not present

## 2018-05-13 MED FILL — ZELBORAF 240 MG TABS: 240 | 30 days supply | Qty: 180 | Fill #0

## 2018-05-14 ENCOUNTER — Ambulatory Visit
Admission: RE | Admit: 2018-05-14 | Discharge: 2018-05-14 | Disposition: A | Discharge status: Home / Self Care | Payer: Medicare Other | Source: Ambulatory Visit | Attending: Radiation Oncology | Admitting: Radiation Oncology

## 2018-05-14 DIAGNOSIS — C439 Malignant melanoma of skin, unspecified: Secondary | ICD-10-CM | POA: Diagnosis not present

## 2018-05-14 DIAGNOSIS — Z510 Encounter for antineoplastic radiation therapy: Secondary | ICD-10-CM | POA: Diagnosis not present

## 2018-05-17 ENCOUNTER — Ambulatory Visit
Admission: RE | Admit: 2018-05-17 | Discharge: 2018-05-17 | Disposition: A | Discharge status: Home / Self Care | Payer: Medicare Other | Source: Ambulatory Visit | Attending: Radiation Oncology | Admitting: Radiation Oncology

## 2018-05-17 ENCOUNTER — Ambulatory Visit: Payer: Medicare Other

## 2018-05-18 ENCOUNTER — Ambulatory Visit
Admission: RE | Admit: 2018-05-18 | Discharge: 2018-05-18 | Disposition: A | Discharge status: Home / Self Care | Payer: Medicare Other | Source: Ambulatory Visit | Attending: Radiation Oncology | Admitting: Radiation Oncology

## 2018-05-18 DIAGNOSIS — Z510 Encounter for antineoplastic radiation therapy: Secondary | ICD-10-CM | POA: Diagnosis not present

## 2018-05-18 DIAGNOSIS — C439 Malignant melanoma of skin, unspecified: Secondary | ICD-10-CM | POA: Diagnosis not present

## 2018-05-19 ENCOUNTER — Ambulatory Visit
Admission: RE | Admit: 2018-05-19 | Discharge: 2018-05-19 | Disposition: A | Discharge status: Home / Self Care | Payer: Medicare Other | Source: Ambulatory Visit | Attending: Radiation Oncology | Admitting: Radiation Oncology

## 2018-05-19 DIAGNOSIS — C439 Malignant melanoma of skin, unspecified: Secondary | ICD-10-CM | POA: Diagnosis not present

## 2018-05-19 DIAGNOSIS — Z510 Encounter for antineoplastic radiation therapy: Secondary | ICD-10-CM | POA: Diagnosis not present

## 2018-05-20 ENCOUNTER — Ambulatory Visit
Admission: RE | Admit: 2018-05-20 | Discharge: 2018-05-20 | Disposition: A | Discharge status: Home / Self Care | Payer: Medicare Other | Source: Ambulatory Visit | Attending: Radiation Oncology | Admitting: Radiation Oncology

## 2018-05-20 DIAGNOSIS — Z510 Encounter for antineoplastic radiation therapy: Secondary | ICD-10-CM | POA: Diagnosis not present

## 2018-05-20 DIAGNOSIS — C439 Malignant melanoma of skin, unspecified: Secondary | ICD-10-CM | POA: Diagnosis not present

## 2018-05-20 DIAGNOSIS — C773 Secondary and unspecified malignant neoplasm of axilla and upper limb lymph nodes: Secondary | ICD-10-CM | POA: Insufficient documentation

## 2018-05-21 ENCOUNTER — Ambulatory Visit
Admission: RE | Admit: 2018-05-21 | Discharge: 2018-05-21 | Disposition: A | Discharge status: Home / Self Care | Payer: Medicare Other | Source: Ambulatory Visit | Attending: Radiation Oncology | Admitting: Radiation Oncology

## 2018-05-21 DIAGNOSIS — S41102A Unspecified open wound of left upper arm, initial encounter: Secondary | ICD-10-CM | POA: Diagnosis not present

## 2018-05-21 DIAGNOSIS — Z6826 Body mass index (BMI) 26.0-26.9, adult: Secondary | ICD-10-CM | POA: Diagnosis not present

## 2018-05-21 DIAGNOSIS — T148XXA Other injury of unspecified body region, initial encounter: Secondary | ICD-10-CM | POA: Diagnosis not present

## 2018-05-21 DIAGNOSIS — C799 Secondary malignant neoplasm of unspecified site: Secondary | ICD-10-CM | POA: Diagnosis not present

## 2018-05-21 DIAGNOSIS — C773 Secondary and unspecified malignant neoplasm of axilla and upper limb lymph nodes: Secondary | ICD-10-CM | POA: Diagnosis not present

## 2018-05-21 DIAGNOSIS — C439 Malignant melanoma of skin, unspecified: Secondary | ICD-10-CM | POA: Diagnosis not present

## 2018-05-21 DIAGNOSIS — Z510 Encounter for antineoplastic radiation therapy: Secondary | ICD-10-CM | POA: Diagnosis not present

## 2018-05-24 ENCOUNTER — Ambulatory Visit
Admission: RE | Admit: 2018-05-24 | Discharge: 2018-05-24 | Disposition: A | Discharge status: Home / Self Care | Payer: Medicare Other | Source: Ambulatory Visit | Attending: Radiation Oncology | Admitting: Radiation Oncology

## 2018-05-24 DIAGNOSIS — C439 Malignant melanoma of skin, unspecified: Secondary | ICD-10-CM | POA: Diagnosis not present

## 2018-05-24 DIAGNOSIS — C773 Secondary and unspecified malignant neoplasm of axilla and upper limb lymph nodes: Secondary | ICD-10-CM | POA: Diagnosis not present

## 2018-05-24 DIAGNOSIS — Z510 Encounter for antineoplastic radiation therapy: Secondary | ICD-10-CM | POA: Diagnosis not present

## 2018-05-24 NOTE — Progress Notes (Signed)
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  INTERVAL HISTORY: Patient returns to clinic today for hospital follow-up and further evaluation.  He continues to have drainage from the metastatic lesion in his left axilla, but this has significantly improved after vascular surgery.  He has restarted his Zelboraf and is tolerating it well.  He has persistent weakness and fatigue. He has no neurologic complaints. He denies any recent fevers or illnesses. He has no chest pain, cough, or hemoptysis. He denies any nausea, vomiting, constipation, or diarrhea. He has no melena or hematochezia. He has no urinary complaints.  Patient offers no further specific complaints today.  REVIEW OF SYSTEMS:   Review of Systems  Constitutional: Positive for malaise/fatigue. Negative for fever and weight loss.  Respiratory: Negative.  Negative for cough and shortness of breath.   Cardiovascular: Negative.  Negative for chest pain and leg swelling.  Gastrointestinal: Negative.  Negative for abdominal pain, blood in stool, melena, nausea and vomiting.  Genitourinary: Negative.  Negative for dysuria.  Musculoskeletal: Negative.  Negative for myalgias.  Skin: Negative.  Negative for rash.  Neurological: Positive for weakness. Negative for sensory change and focal weakness.  Psychiatric/Behavioral: Negative.  The patient is not nervous/anxious.     As per HPI. Otherwise, a complete review of systems is negative.  PAST MEDICAL HISTORY: Past Medical History:  Diagnosis Date  . Anemia   . Arthritis   . Cancer ([place])   . Complication of anesthesia   . PONV (postoperative nausea and vomiting)     PAST SURGICAL HISTORY: Past Surgical History:  Procedure  Laterality Date  . AXILLARY LYMPH NODE DISSECTION Left 06/26/2017   Procedure: EXCISION OF LEFT AXILLARY MASS;  Surgeon: Johnathan Hausen, MD;  Location: [facility] ORS;  Service: General;  Laterality: Left;  . DIAGNOSTIC LAPAROSCOPY    . EMBOLIZATION Left 05/11/2018   Procedure: EMBOLIZATION;  Surgeon: Katha Cabal, MD;  Location: [place] [facility];  Service: Cardiovascular;  Laterality: Left;  . LAPAROTOMY N/A 10/14/2017   Procedure: EXPLORATORY LAPAROTOMY;  Surgeon: Clayburn Pert, MD;  Location: [facility] ORS;  Service: General;  Laterality: N/A;  . VASECTOMY      FAMILY HISTORY: Family History  Problem Relation Age of Onset  . Diabetes Mother   . Hypertension Mother   . Leukemia Father   . Thyroid disease Sister   . Liver disease Maternal Grandmother   . Heart Problems Maternal Grandfather   . Diabetes Maternal Grandfather   . Lung cancer Paternal Grandmother   . Prostate cancer Paternal Grandfather     ADVANCED DIRECTIVES (Y/N):  N  HEALTH MAINTENANCE: Social History   Tobacco Use  . Smoking status: Never Smoker  . Smokeless tobacco: Never Used  Substance Use Topics  . Alcohol use: No  . Drug use: No     Colonoscopy:  PAP:  Bone density:  Lipid panel:  No Known Allergies  Current Outpatient Medications  Medication Sig Dispense Refill  . Multiple Vitamin (MULTIVITAMIN) tablet Take 1 tablet by mouth daily.    . vemurafenib (ZELBORAF) 240 MG tablet Take 3 tablets (720 mg total) by mouth every 12 (twelve) hours. 180 tablet 2  . Oxycodone HCl 10 MG TABS Take 1 tablet (10 mg total) by mouth every 6 (six) hours as needed. (  Patient not taking: Reported on 05/26/2018) 60 tablet 0   No current facility-administered medications for this visit.     OBJECTIVE: Vitals:   05/26/18 1300  BP: 112/74  Pulse: 67  Resp: 18  Temp: 97.6 F (36.4 C)     Body mass index is 26.24 kg/m.    ECOG FS:0 - Asymptomatic  General: Well-developed, well-nourished, no acute  distress. Eyes: Pink conjunctiva, anicteric sclera. HEENT: Normocephalic, moist mucous membranes. Lungs: Clear to auscultation bilaterally. Heart: Regular rate and rhythm. No rubs, murmurs, or gallops. Abdomen: Soft, nontender, nondistended. No organomegaly noted, normoactive bowel sounds. Musculoskeletal: Left axillary lymphadenopathy significantly reduced, open wound with necrotic tissue.  No obvious bleeding. Neuro: Alert, answering all questions appropriately. Cranial nerves grossly intact. Skin: No rashes or petechiae noted. Psych: Normal affect.  LAB RESULTS:  Lab Results  Component Value Date   NA 137 05/26/2018   K 4.2 05/26/2018   CL 105 05/26/2018   CO2 22 05/26/2018   GLUCOSE 91 05/26/2018   BUN 18 05/26/2018   CREATININE 1.22 05/26/2018   CALCIUM 8.2 (L) 05/26/2018   PROT 6.9 05/26/2018   ALBUMIN 3.6 05/26/2018   AST 15 05/26/2018   ALT 10 05/26/2018   ALKPHOS 65 05/26/2018   BILITOT 0.7 05/26/2018   GFRNONAA 59 (L) 05/26/2018   GFRAA >60 05/26/2018    Lab Results  Component Value Date   WBC 5.1 05/26/2018   NEUTROABS 3.8 05/26/2018   HGB 9.2 (L) 05/26/2018   HCT 28.1 (L) 05/26/2018   MCV 77.9 (L) 05/26/2018   PLT 292 05/26/2018     STUDIES: Ct Chest W Contrast  Result Date: 04/30/2018 CLINICAL DATA:  Metastatic melanoma.  Left axillary mass. EXAM: CT CHEST WITH CONTRAST TECHNIQUE: Multidetector CT imaging of the chest was performed during intravenous contrast administration. CONTRAST:  136m ISOVUE-300 IOPAMIDOL (ISOVUE-300) INJECTION 61% COMPARISON:  PET-CT 03/04/2018 FINDINGS: Cardiovascular: Unremarkable Mediastinum/Nodes: Small bilateral hilar lymph nodes are not pathologically enlarged by size criteria. The left axillary mass is difficult to fully included on today's imaging because of its large size, but the size is approximately 15.7 by 9.0 by 12.0 cm (volume = 890 cm^3) previously this measured 7.5 by 5.7 by 7.4 cm (volume = 170 cm^3). Lungs/Pleura:  Stable scarring at the left lung base. Faint stable nodularity is likely benign. This includes a 3 mm right middle lobe calcified nodule which is probably a calcified granuloma. Upper Abdomen: Stable hypodense 1 cm lesion in segment IVa of the liver on image 127/2 and similar lesion in segment V of the liver. Neither was previously hypermetabolic. Musculoskeletal: Thoracic spondylosis. IMPRESSION: 1. The large left axillary mass has dramatically increased in size, measuring over 5 times the volume that it did on 03/04/2018. Appearance compatible with rapid growth of malignancy. 2. Two small hypodense lesions in the liver highly likely to be benign and were not previously hypermetabolic. Both are likely cysts. Electronically Signed   By: WVan ClinesM.D.   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  PLAN:    1. 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  Patient recently noted to have a rapidly enlarging left axillary mass with bleeding that required vascular surgery intervention.  He is approximately halfway through his XRT with significant improvement of the size of mass.  Previously, immunotherapy  was discontinued altogether secondary to an intestinal perforation.  Zelboraf has been increased to 720 mg twice per day.  Patient will require a PET scan in the near future to assess for further systemic disease, but will wait until he completes XRT.  Return to clinic in 1 month for repeat laboratory can further evaluation at which time we will schedule PET scan. 2.  Left axilla: Significantly improved in size.  Continue XRT which will be completed on June 04, 2018.  Appreciate vascular surgical input to reduce bleeding.  Patient was encouraged to schedule appointment at the  wound care center for debridement and monitoring of his open wound.  This appointment is scheduled for June 15, 2018. 3.  Poor appetite: Continue Megace as prescribed.  Appreciate dietary input. 4.  Anemia: Patient's hemoglobin is 9.2.  He does not require blood transfusion at this time.  Patient expressed understanding and was in agreement with this plan. He also understands that He can call clinic at any time with any questions, concerns, or complaints.   Cancer Staging Metastatic melanoma [facility]) Staging form: Melanoma of the Skin, AJCC 8th Edition - Clinical stage from 07/15/2017: Stage IV (cTX, cN1, cM1c(0)) - Signed by Lloyd Huger, MD on 07/15/2017   Lloyd Huger, MD   05/30/2018 7:18 AM

## 2018-05-25 ENCOUNTER — Ambulatory Visit
Admission: RE | Admit: 2018-05-25 | Discharge: 2018-05-25 | Disposition: A | Discharge status: Home / Self Care | Payer: Medicare Other | Source: Ambulatory Visit | Attending: Radiation Oncology | Admitting: Radiation Oncology

## 2018-05-25 DIAGNOSIS — Z510 Encounter for antineoplastic radiation therapy: Secondary | ICD-10-CM | POA: Diagnosis not present

## 2018-05-25 DIAGNOSIS — C439 Malignant melanoma of skin, unspecified: Secondary | ICD-10-CM | POA: Diagnosis not present

## 2018-05-25 DIAGNOSIS — C773 Secondary and unspecified malignant neoplasm of axilla and upper limb lymph nodes: Secondary | ICD-10-CM | POA: Diagnosis not present

## 2018-05-26 ENCOUNTER — Inpatient Hospital Stay (HOSPITAL_BASED_OUTPATIENT_CLINIC_OR_DEPARTMENT_OTHER): Payer: Medicare Other | Admitting: Oncology

## 2018-05-26 ENCOUNTER — Inpatient Hospital Stay: Payer: Medicare Other | Attending: Oncology

## 2018-05-26 ENCOUNTER — Ambulatory Visit
Admission: RE | Admit: 2018-05-26 | Discharge: 2018-05-26 | Disposition: A | Discharge status: Home / Self Care | Payer: Medicare Other | Source: Ambulatory Visit | Attending: Radiation Oncology | Admitting: Radiation Oncology

## 2018-05-26 ENCOUNTER — Encounter: Payer: Self-pay | Admitting: Oncology

## 2018-05-26 VITALS — BP 112/74 | HR 67 | Temp 97.6°F | Resp 18 | Wt 188.1 lb

## 2018-05-26 DIAGNOSIS — D649 Anemia, unspecified: Secondary | ICD-10-CM | POA: Insufficient documentation

## 2018-05-26 DIAGNOSIS — C439 Malignant melanoma of skin, unspecified: Secondary | ICD-10-CM

## 2018-05-26 DIAGNOSIS — R5383 Other fatigue: Secondary | ICD-10-CM | POA: Diagnosis not present

## 2018-05-26 DIAGNOSIS — R630 Anorexia: Secondary | ICD-10-CM | POA: Insufficient documentation

## 2018-05-26 DIAGNOSIS — Z801 Family history of malignant neoplasm of trachea, bronchus and lung: Secondary | ICD-10-CM | POA: Insufficient documentation

## 2018-05-26 DIAGNOSIS — C773 Secondary and unspecified malignant neoplasm of axilla and upper limb lymph nodes: Secondary | ICD-10-CM | POA: Diagnosis not present

## 2018-05-26 DIAGNOSIS — C7951 Secondary malignant neoplasm of bone: Secondary | ICD-10-CM | POA: Diagnosis not present

## 2018-05-26 DIAGNOSIS — R531 Weakness: Secondary | ICD-10-CM | POA: Insufficient documentation

## 2018-05-26 DIAGNOSIS — C799 Secondary malignant neoplasm of unspecified site: Secondary | ICD-10-CM

## 2018-05-26 DIAGNOSIS — Z923 Personal history of irradiation: Secondary | ICD-10-CM | POA: Insufficient documentation

## 2018-05-26 DIAGNOSIS — Z510 Encounter for antineoplastic radiation therapy: Secondary | ICD-10-CM | POA: Diagnosis not present

## 2018-05-26 LAB — CBC WITH DIFFERENTIAL/PLATELET
BASOS ABS: 0 10*3/uL (ref 0–0.1)
Basophils Relative: 1 %
EOS PCT: 2 %
Eosinophils Absolute: 0.1 10*3/uL (ref 0–0.7)
HCT: 28.1 % — ABNORMAL LOW (ref 40.0–52.0)
Hemoglobin: 9.2 g/dL — ABNORMAL LOW (ref 13.0–18.0)
LYMPHS PCT: 12 %
Lymphs Abs: 0.6 10*3/uL — ABNORMAL LOW (ref 1.0–3.6)
MCH: 25.6 pg — AB (ref 26.0–34.0)
MCHC: 32.8 g/dL (ref 32.0–36.0)
MCV: 77.9 fL — AB (ref 80.0–100.0)
Monocytes Absolute: 0.6 10*3/uL (ref 0.2–1.0)
Monocytes Relative: 12 %
Neutro Abs: 3.8 10*3/uL (ref 1.4–6.5)
Neutrophils Relative %: 73 %
Platelets: 292 10*3/uL (ref 150–440)
RBC: 3.61 MIL/uL — ABNORMAL LOW (ref 4.40–5.90)
RDW: 15.6 % — ABNORMAL HIGH (ref 11.5–14.5)
WBC: 5.1 10*3/uL (ref 3.8–10.6)

## 2018-05-26 LAB — COMPREHENSIVE METABOLIC PANEL
ALT: 10 U/L (ref 0–44)
AST: 15 U/L (ref 15–41)
Albumin: 3.6 g/dL (ref 3.5–5.0)
Alkaline Phosphatase: 65 U/L (ref 38–126)
Anion gap: 10 (ref 5–15)
BUN: 18 mg/dL (ref 8–23)
CO2: 22 mmol/L (ref 22–32)
Calcium: 8.2 mg/dL — ABNORMAL LOW (ref 8.9–10.3)
Chloride: 105 mmol/L (ref 98–111)
Creatinine, Ser: 1.22 mg/dL (ref 0.61–1.24)
GFR calc Af Amer: >60 mL/min (ref >60)
GFR, EST NON AFRICAN AMERICAN: 59 mL/min — AB (ref >60)
Glucose, Bld: 91 mg/dL (ref 70–99)
Potassium: 4.2 mmol/L (ref 3.5–5.1)
Sodium: 137 mmol/L (ref 135–145)
Total Bilirubin: 0.7 mg/dL (ref 0.3–1.2)
Total Protein: 6.9 g/dL (ref 6.5–8.1)

## 2018-05-26 NOTE — Progress Notes (Signed)
Pt in for follow up, concerned about left axillary wound which is draining and has a very strong foul odor.  RN cleaned wound with NS and applied new occlusive dressing.

## 2018-05-27 ENCOUNTER — Telehealth: Payer: Self-pay | Admitting: Pharmacist

## 2018-05-27 ENCOUNTER — Ambulatory Visit
Admission: RE | Admit: 2018-05-27 | Discharge: 2018-05-27 | Disposition: A | Discharge status: Home / Self Care | Payer: Medicare Other | Source: Ambulatory Visit | Attending: Radiation Oncology | Admitting: Radiation Oncology

## 2018-05-27 DIAGNOSIS — C773 Secondary and unspecified malignant neoplasm of axilla and upper limb lymph nodes: Secondary | ICD-10-CM | POA: Diagnosis not present

## 2018-05-27 DIAGNOSIS — Z510 Encounter for antineoplastic radiation therapy: Secondary | ICD-10-CM | POA: Diagnosis not present

## 2018-05-27 DIAGNOSIS — C439 Malignant melanoma of skin, unspecified: Secondary | ICD-10-CM | POA: Diagnosis not present

## 2018-05-27 LAB — THYROID PANEL WITH TSH
Free Thyroxine Index: 1.5 (ref 1.2–4.9)
T3 UPTAKE RATIO: 28 % (ref 24–39)
T4, Total: 5.4 ug/dL (ref 4.5–12.0)
TSH: 5.12 u[IU]/mL — ABNORMAL HIGH (ref 0.450–4.500)

## 2018-05-27 NOTE — Telephone Encounter (Signed)
Oral Chemotherapy Pharmacist Encounter  Follow-Up Form  Called patient today to follow up regarding patient's oral chemotherapy medication: Zelboraf (vemurafenib)  Original Start date of oral chemotherapy: 10/2017 (pt has hold some holds in therapy due to intolerance issues)  Pt reports 0 tablets/doses of Zelboraf missed in the last month.   He dose increased his Zelboraf to 3 tablets bid from 2 tablets bid and has not had noticed any side effects or intolerance.  Pt reports the following side effects: None reported  Recent labs reviewed: CBC/CMP from 05/26/18  New medications?: None reported  Other Issues: None reported  Patient knows to call the office with questions or concerns. Oral Oncology Clinic will continue to follow.  Darl Pikes, PharmD, BCPS, [facility] Hematology/Oncology Clinical Pharmacist [facility] [phone]  05/27/2018 8:56 AM

## 2018-05-28 ENCOUNTER — Ambulatory Visit
Admission: RE | Admit: 2018-05-28 | Discharge: 2018-05-28 | Disposition: A | Discharge status: Home / Self Care | Payer: Medicare Other | Source: Ambulatory Visit | Attending: Radiation Oncology | Admitting: Radiation Oncology

## 2018-05-28 DIAGNOSIS — C773 Secondary and unspecified malignant neoplasm of axilla and upper limb lymph nodes: Secondary | ICD-10-CM | POA: Diagnosis not present

## 2018-05-28 DIAGNOSIS — Z510 Encounter for antineoplastic radiation therapy: Secondary | ICD-10-CM | POA: Diagnosis not present

## 2018-05-28 DIAGNOSIS — C439 Malignant melanoma of skin, unspecified: Secondary | ICD-10-CM | POA: Diagnosis not present

## 2018-05-31 ENCOUNTER — Ambulatory Visit
Admission: RE | Admit: 2018-05-31 | Discharge: 2018-05-31 | Disposition: A | Discharge status: Home / Self Care | Payer: Medicare Other | Source: Ambulatory Visit | Attending: Radiation Oncology | Admitting: Radiation Oncology

## 2018-05-31 ENCOUNTER — Other Ambulatory Visit: Payer: Self-pay | Admitting: *Deleted

## 2018-05-31 DIAGNOSIS — C773 Secondary and unspecified malignant neoplasm of axilla and upper limb lymph nodes: Secondary | ICD-10-CM | POA: Diagnosis not present

## 2018-05-31 DIAGNOSIS — C439 Malignant melanoma of skin, unspecified: Secondary | ICD-10-CM | POA: Diagnosis not present

## 2018-05-31 DIAGNOSIS — Z510 Encounter for antineoplastic radiation therapy: Secondary | ICD-10-CM | POA: Diagnosis not present

## 2018-05-31 MED ORDER — LEVOFLOXACIN 500 MG PO TABS: 500.0000 mg | ORAL_TABLET | Freq: Every day | ORAL | 0 refills | Status: DC

## 2018-06-01 ENCOUNTER — Ambulatory Visit
Admission: RE | Admit: 2018-06-01 | Discharge: 2018-06-01 | Disposition: A | Discharge status: Home / Self Care | Payer: Medicare Other | Source: Ambulatory Visit | Attending: Radiation Oncology | Admitting: Radiation Oncology

## 2018-06-01 DIAGNOSIS — C439 Malignant melanoma of skin, unspecified: Secondary | ICD-10-CM | POA: Diagnosis not present

## 2018-06-01 DIAGNOSIS — Z510 Encounter for antineoplastic radiation therapy: Secondary | ICD-10-CM | POA: Diagnosis not present

## 2018-06-01 DIAGNOSIS — C773 Secondary and unspecified malignant neoplasm of axilla and upper limb lymph nodes: Secondary | ICD-10-CM | POA: Diagnosis not present

## 2018-06-02 ENCOUNTER — Ambulatory Visit: Payer: Medicare Other

## 2018-06-02 ENCOUNTER — Ambulatory Visit
Admission: RE | Admit: 2018-06-02 | Discharge: 2018-06-02 | Disposition: A | Discharge status: Home / Self Care | Payer: Medicare Other | Source: Ambulatory Visit | Attending: Radiation Oncology | Admitting: Radiation Oncology

## 2018-06-02 DIAGNOSIS — C773 Secondary and unspecified malignant neoplasm of axilla and upper limb lymph nodes: Secondary | ICD-10-CM | POA: Diagnosis not present

## 2018-06-02 DIAGNOSIS — Z510 Encounter for antineoplastic radiation therapy: Secondary | ICD-10-CM | POA: Diagnosis not present

## 2018-06-02 DIAGNOSIS — C439 Malignant melanoma of skin, unspecified: Secondary | ICD-10-CM | POA: Diagnosis not present

## 2018-06-03 ENCOUNTER — Encounter (HOSPITAL_COMMUNITY): Payer: Self-pay

## 2018-06-03 ENCOUNTER — Ambulatory Visit: Payer: Medicare Other

## 2018-06-03 ENCOUNTER — Ambulatory Visit
Admission: RE | Admit: 2018-06-03 | Discharge: 2018-06-03 | Disposition: A | Discharge status: Home / Self Care | Payer: Medicare Other | Source: Ambulatory Visit | Attending: Radiation Oncology | Admitting: Radiation Oncology

## 2018-06-03 DIAGNOSIS — C773 Secondary and unspecified malignant neoplasm of axilla and upper limb lymph nodes: Secondary | ICD-10-CM | POA: Diagnosis not present

## 2018-06-03 DIAGNOSIS — Z510 Encounter for antineoplastic radiation therapy: Secondary | ICD-10-CM | POA: Diagnosis not present

## 2018-06-03 DIAGNOSIS — C439 Malignant melanoma of skin, unspecified: Secondary | ICD-10-CM | POA: Diagnosis not present

## 2018-06-03 NOTE — Progress Notes (Signed)
Social worker provided counseling services in the cancer center to patient.

## 2018-06-04 ENCOUNTER — Ambulatory Visit
Admission: RE | Admit: 2018-06-04 | Discharge: 2018-06-04 | Disposition: A | Discharge status: Home / Self Care | Payer: Medicare Other | Source: Ambulatory Visit | Attending: Radiation Oncology | Admitting: Radiation Oncology

## 2018-06-04 DIAGNOSIS — Z510 Encounter for antineoplastic radiation therapy: Secondary | ICD-10-CM | POA: Diagnosis not present

## 2018-06-04 DIAGNOSIS — C439 Malignant melanoma of skin, unspecified: Secondary | ICD-10-CM | POA: Diagnosis not present

## 2018-06-04 DIAGNOSIS — C773 Secondary and unspecified malignant neoplasm of axilla and upper limb lymph nodes: Secondary | ICD-10-CM | POA: Diagnosis not present

## 2018-06-08 DIAGNOSIS — Z510 Encounter for antineoplastic radiation therapy: Secondary | ICD-10-CM | POA: Diagnosis not present

## 2018-06-08 DIAGNOSIS — C439 Malignant melanoma of skin, unspecified: Secondary | ICD-10-CM | POA: Diagnosis not present

## 2018-06-08 DIAGNOSIS — C773 Secondary and unspecified malignant neoplasm of axilla and upper limb lymph nodes: Secondary | ICD-10-CM | POA: Diagnosis not present

## 2018-06-14 MED FILL — ZELBORAF 240 MG TABS: 240 | 30 days supply | Qty: 180 | Fill #1

## 2018-06-15 ENCOUNTER — Encounter: Payer: Medicare Other | Attending: Physician Assistant | Admitting: Physician Assistant

## 2018-06-15 DIAGNOSIS — Z923 Personal history of irradiation: Secondary | ICD-10-CM | POA: Insufficient documentation

## 2018-06-15 DIAGNOSIS — Z9221 Personal history of antineoplastic chemotherapy: Secondary | ICD-10-CM | POA: Diagnosis not present

## 2018-06-15 DIAGNOSIS — Z85820 Personal history of malignant melanoma of skin: Secondary | ICD-10-CM | POA: Insufficient documentation

## 2018-06-15 DIAGNOSIS — L98495 Non-pressure chronic ulcer of skin of other sites with muscle involvement without evidence of necrosis: Secondary | ICD-10-CM | POA: Diagnosis not present

## 2018-06-15 DIAGNOSIS — C773 Secondary and unspecified malignant neoplasm of axilla and upper limb lymph nodes: Secondary | ICD-10-CM | POA: Diagnosis not present

## 2018-06-15 DIAGNOSIS — T8189XA Other complications of procedures, not elsewhere classified, initial encounter: Secondary | ICD-10-CM | POA: Diagnosis not present

## 2018-06-21 NOTE — Progress Notes (Signed)
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  INTERVAL HISTORY: Patient returns to clinic today for repeat laboratory work and further evaluation.  He continues to have increased weakness and fatigue, but states this has improved over the past several days.  He otherwise is tolerating Zelboraf well.  He has noted increased swelling in his left arm.  He has no neurologic complaints. He denies any recent fevers or illnesses. He has no chest pain, cough, or hemoptysis. He denies any nausea, vomiting, constipation, or diarrhea. He has no melena or hematochezia. He has no urinary complaints.  Patient offers no further specific complaints today.  REVIEW OF SYSTEMS:   Review of Systems  Constitutional: Positive for malaise/fatigue. Negative for fever and weight loss.  Respiratory: Negative.  Negative for cough and shortness of breath.   Cardiovascular: Negative.  Negative for chest pain and leg swelling.  Gastrointestinal: Negative.  Negative for abdominal pain, blood in stool, melena, nausea and vomiting.  Genitourinary: Negative.  Negative for dysuria.  Musculoskeletal: Negative.  Negative for myalgias.  Skin: Negative.  Negative for rash.  Neurological: Positive for weakness. Negative for sensory change and focal weakness.  Psychiatric/Behavioral: Negative.  The patient is not nervous/anxious.     As per HPI. Otherwise, a complete review of systems is negative.  PAST MEDICAL HISTORY: Past Medical History:  Diagnosis Date  . Anemia   . Arthritis   . Cancer ([place])   . Complication of anesthesia   . PONV (postoperative nausea and vomiting)     PAST SURGICAL HISTORY: Past Surgical History:  Procedure Laterality Date  . AXILLARY LYMPH  NODE DISSECTION Left 06/26/2017   Procedure: EXCISION OF LEFT AXILLARY MASS;  Surgeon: Johnathan Hausen, MD;  Location: [facility] OR[facility];  Service: General;  Laterality: Left;  . DIAGNOSTIC LAPAROSCOPY    . EMBOLIZATION Left 05/11/2018   Procedure: EMBOLIZATION;  Surgeon: Katha Cabal, MD;  Location: [place] [facility];  Service: Cardiovascular;  Laterality: Left;  . LAPAROTOMY N/A 10/14/2017   Procedure: EXPLORATORY LAPAROTOMY;  Surgeon: Clayburn Pert, MD;  Location: [facility] OR[facility];  Service: General;  Laterality: N/A;  . VASECTOMY      FAMILY HISTORY: Family History  Problem Relation Age of Onset  . Diabetes Mother   . Hypertension Mother   . Leukemia Father   . Thyroid disease Sister   . Liver disease Maternal Grandmother   . Heart Problems Maternal Grandfather   . Diabetes Maternal Grandfather   . Lung cancer Paternal Grandmother   . Prostate cancer Paternal Grandfather     ADVANCED DIRECTIVES (Y/N):  N  HEALTH MAINTENANCE: Social History   Tobacco Use  . Smoking status: Never Smoker  . Smokeless tobacco: Never Used  Substance Use Topics  . Alcohol use: No  . Drug use: No     Colonoscopy:  PAP:  Bone density:  Lipid panel:  No Known Allergies  Current Outpatient Medications  Medication Sig Dispense Refill  . Multiple Vitamin (MULTIVITAMIN) tablet Take 1 tablet by mouth daily.    . Oxycodone HCl 10 MG TABS Take 1 tablet (10 mg total) by mouth every 6 (six) hours as needed. 60 tablet 0  . vemurafenib (ZELBORAF) 240 MG tablet Take 3 tablets (720 mg total) by mouth every 12 (twelve) hours. 180 tablet  2   No current facility-administered medications for this visit.     OBJECTIVE: Vitals:   06/23/18 1019 06/23/18 1023  BP:  108/65  Pulse:  (!) 55  Resp: 12   Temp:  97.6 F (36.4 C)     Body mass index is 25.84 kg/m.    ECOG FS:0 - Asymptomatic  General: Well-developed, well-nourished, no acute distress. Eyes: Pink conjunctiva, anicteric sclera. HEENT:  Normocephalic, moist mucous membranes. Lungs: Clear to auscultation bilaterally. Heart: Regular rate and rhythm. No rubs, murmurs, or gallops. Abdomen: Soft, nontender, nondistended. No organomegaly noted, normoactive bowel sounds. Musculoskeletal: Left arm with significant lymphedema. Neuro: Alert, answering all questions appropriately. Cranial nerves grossly intact. Skin: No rashes or petechiae noted. Psych: Normal affect.  LAB RESULTS:  Lab Results  Component Value Date   NA 138 06/23/2018   K 3.6 06/23/2018   CL 107 06/23/2018   CO2 23 06/23/2018   GLUCOSE 102 (H) 06/23/2018   BUN 16 06/23/2018   CREATININE 1.12 06/23/2018   CALCIUM 8.1 (L) 06/23/2018   PROT 6.6 06/23/2018   ALBUMIN 3.2 (L) 06/23/2018   AST 14 (L) 06/23/2018   ALT 9 06/23/2018   ALKPHOS 52 06/23/2018   BILITOT 0.7 06/23/2018   GFRNONAA >60 06/23/2018   GFRAA >60 06/23/2018    Lab Results  Component Value Date   WBC 4.0 06/23/2018   NEUTROABS 3.0 06/23/2018   HGB 8.5 (L) 06/23/2018   HCT 26.7 (L) 06/23/2018   MCV 72.8 (L) 06/23/2018   PLT 272 06/23/2018     STUDIES: US Venous Img Upper Uni Left  Result Date: 06/23/2018 CLINICAL DATA:  Left upper extremity swelling for 6 weeks. History of metastatic melanoma. EXAM: LEFT UPPER EXTREMITY VENOUS DUPLEX ULTRASOUND TECHNIQUE: Doppler venous assessment of the left upper extremity deep venous system was performed, including characterization of spectral flow, compressibility, and phasicity. COMPARISON:  None. FINDINGS: There is complete compressibility of the left jugular, subclavian, axillary, brachial, radial, and ulnar veins. Color Doppler imaging demonstrates patency of these vessels. Doppler waveform analysis demonstrates a phasic venous waveform. There is no obvious superficial vein thrombosis in the left upper extremity. IMPRESSION: No evidence of left upper extremity DVT. Electronically Signed   By: Marybelle Killings M.D.   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  PLAN:    1. 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  Patient recently noted to have a rapidly enlarging left axillary mass with bleeding that required vascular surgery intervention.  He has now completed XRT to his left axilla. 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  Continue Zelboraf 720 mg twice per day.  Return to clinic in 4 weeks with repeat laboratory work and further evaluation.  We will get a PET scan 1 to 2 days prior to next clinic visit. 2.  Left axilla: Significantly improved in size.  Patient completed XRT on June 04, 2018.  Appreciate vascular surgical input to reduce bleeding.   3.  Left arm swelling: Ultrasound negative for DVT.  Patient was given a referral to lymphedema clinic. 4.  Poor appetite: Continue Megace as prescribed.  Appreciate dietary input. 5.  Anemia: Patient's hemoglobin is trending down and is now 8.5.  He does not require transfusion at this time but will likely require one in  the near future.  Patient expressed understanding and was in agreement with this plan. He also understands that He can call clinic at any time with any questions, concerns, or complaints.   Cancer Staging Metastatic melanoma [facility]) Staging form: Melanoma of the Skin, AJCC 8th Edition - Clinical stage from 07/15/2017: Stage IV (cTX, cN1, cM1c(0)) - Signed by Lloyd Huger, MD on 07/15/2017   Lloyd Huger, MD   06/26/2018 6:30 AM

## 2018-06-22 ENCOUNTER — Encounter: Payer: Medicare Other | Attending: Physician Assistant | Admitting: Physician Assistant

## 2018-06-22 DIAGNOSIS — Z9221 Personal history of antineoplastic chemotherapy: Secondary | ICD-10-CM | POA: Diagnosis not present

## 2018-06-22 DIAGNOSIS — Z880 Allergy status to penicillin: Secondary | ICD-10-CM | POA: Insufficient documentation

## 2018-06-22 DIAGNOSIS — M1990 Unspecified osteoarthritis, unspecified site: Secondary | ICD-10-CM | POA: Diagnosis not present

## 2018-06-22 DIAGNOSIS — Z809 Family history of malignant neoplasm, unspecified: Secondary | ICD-10-CM | POA: Diagnosis not present

## 2018-06-22 DIAGNOSIS — L98495 Non-pressure chronic ulcer of skin of other sites with muscle involvement without evidence of necrosis: Secondary | ICD-10-CM | POA: Diagnosis not present

## 2018-06-22 DIAGNOSIS — Z8249 Family history of ischemic heart disease and other diseases of the circulatory system: Secondary | ICD-10-CM | POA: Insufficient documentation

## 2018-06-22 DIAGNOSIS — Z833 Family history of diabetes mellitus: Secondary | ICD-10-CM | POA: Diagnosis not present

## 2018-06-22 DIAGNOSIS — C773 Secondary and unspecified malignant neoplasm of axilla and upper limb lymph nodes: Secondary | ICD-10-CM | POA: Insufficient documentation

## 2018-06-22 DIAGNOSIS — L98492 Non-pressure chronic ulcer of skin of other sites with fat layer exposed: Secondary | ICD-10-CM | POA: Diagnosis not present

## 2018-06-23 ENCOUNTER — Other Ambulatory Visit: Payer: Self-pay

## 2018-06-23 ENCOUNTER — Inpatient Hospital Stay: Payer: Medicare Other

## 2018-06-23 ENCOUNTER — Encounter: Payer: Self-pay | Admitting: Oncology

## 2018-06-23 ENCOUNTER — Inpatient Hospital Stay: Payer: Medicare Other | Attending: Oncology | Admitting: Oncology

## 2018-06-23 ENCOUNTER — Ambulatory Visit
Admission: RE | Admit: 2018-06-23 | Discharge: 2018-06-23 | Disposition: A | Discharge status: Home / Self Care | Payer: Medicare Other | Source: Ambulatory Visit | Attending: Oncology | Admitting: Oncology

## 2018-06-23 ENCOUNTER — Other Ambulatory Visit
Admission: RE | Admit: 2018-06-23 | Discharge: 2018-06-23 | Disposition: A | Discharge status: Home / Self Care | Payer: Medicare Other | Source: Ambulatory Visit | Attending: *Deleted | Admitting: *Deleted

## 2018-06-23 ENCOUNTER — Telehealth: Payer: Self-pay | Admitting: *Deleted

## 2018-06-23 VITALS — BP 108/65 | HR 55 | Temp 97.6°F | Resp 12 | Ht 71.0 in | Wt 185.3 lb

## 2018-06-23 DIAGNOSIS — C439 Malignant melanoma of skin, unspecified: Secondary | ICD-10-CM

## 2018-06-23 DIAGNOSIS — C7951 Secondary malignant neoplasm of bone: Secondary | ICD-10-CM | POA: Diagnosis not present

## 2018-06-23 DIAGNOSIS — C781 Secondary malignant neoplasm of mediastinum: Secondary | ICD-10-CM | POA: Insufficient documentation

## 2018-06-23 DIAGNOSIS — M7989 Other specified soft tissue disorders: Secondary | ICD-10-CM

## 2018-06-23 DIAGNOSIS — Z8042 Family history of malignant neoplasm of prostate: Secondary | ICD-10-CM

## 2018-06-23 DIAGNOSIS — Z1509 Genetic susceptibility to other malignant neoplasm: Secondary | ICD-10-CM | POA: Insufficient documentation

## 2018-06-23 DIAGNOSIS — M129 Arthropathy, unspecified: Secondary | ICD-10-CM | POA: Diagnosis not present

## 2018-06-23 DIAGNOSIS — R531 Weakness: Secondary | ICD-10-CM | POA: Insufficient documentation

## 2018-06-23 DIAGNOSIS — Z79899 Other long term (current) drug therapy: Secondary | ICD-10-CM | POA: Diagnosis not present

## 2018-06-23 DIAGNOSIS — R5383 Other fatigue: Secondary | ICD-10-CM | POA: Insufficient documentation

## 2018-06-23 DIAGNOSIS — Z923 Personal history of irradiation: Secondary | ICD-10-CM | POA: Diagnosis not present

## 2018-06-23 DIAGNOSIS — C773 Secondary and unspecified malignant neoplasm of axilla and upper limb lymph nodes: Secondary | ICD-10-CM | POA: Insufficient documentation

## 2018-06-23 DIAGNOSIS — D649 Anemia, unspecified: Secondary | ICD-10-CM | POA: Insufficient documentation

## 2018-06-23 DIAGNOSIS — C799 Secondary malignant neoplasm of unspecified site: Secondary | ICD-10-CM

## 2018-06-23 DIAGNOSIS — Z801 Family history of malignant neoplasm of trachea, bronchus and lung: Secondary | ICD-10-CM | POA: Diagnosis not present

## 2018-06-23 DIAGNOSIS — I890 Lymphedema, not elsewhere classified: Secondary | ICD-10-CM

## 2018-06-23 LAB — CBC WITH DIFFERENTIAL/PLATELET
BASOS ABS: 0 10*3/uL (ref 0–0.1)
Basophils Relative: 0 %
EOS PCT: 1 %
Eosinophils Absolute: 0 10*3/uL (ref 0–0.7)
HCT: 26.7 % — ABNORMAL LOW (ref 40.0–52.0)
HEMOGLOBIN: 8.5 g/dL — AB (ref 13.0–18.0)
LYMPHS ABS: 0.3 10*3/uL — AB (ref 1.0–3.6)
LYMPHS PCT: 8 %
MCH: 23.2 pg — ABNORMAL LOW (ref 26.0–34.0)
MCHC: 31.8 g/dL — ABNORMAL LOW (ref 32.0–36.0)
MCV: 72.8 fL — AB (ref 80.0–100.0)
Monocytes Absolute: 0.6 10*3/uL (ref 0.2–1.0)
Monocytes Relative: 14 %
Neutro Abs: 3 10*3/uL (ref 1.4–6.5)
Neutrophils Relative %: 77 %
PLATELETS: 272 10*3/uL (ref 150–440)
RBC: 3.67 MIL/uL — AB (ref 4.40–5.90)
RDW: 16.9 % — ABNORMAL HIGH (ref 11.5–14.5)
WBC: 4 10*3/uL (ref 3.8–10.6)

## 2018-06-23 LAB — COMPREHENSIVE METABOLIC PANEL
ALT: 9 U/L (ref 0–44)
AST: 14 U/L — ABNORMAL LOW (ref 15–41)
Albumin: 3.2 g/dL — ABNORMAL LOW (ref 3.5–5.0)
Alkaline Phosphatase: 52 U/L (ref 38–126)
Anion gap: 8 (ref 5–15)
BILIRUBIN TOTAL: 0.7 mg/dL (ref 0.3–1.2)
BUN: 16 mg/dL (ref 8–23)
CHLORIDE: 107 mmol/L (ref 98–111)
CO2: 23 mmol/L (ref 22–32)
Calcium: 8.1 mg/dL — ABNORMAL LOW (ref 8.9–10.3)
Creatinine, Ser: 1.12 mg/dL (ref 0.61–1.24)
GFR calc Af Amer: >60 mL/min (ref >60)
Glucose, Bld: 102 mg/dL — ABNORMAL HIGH (ref 70–99)
POTASSIUM: 3.6 mmol/L (ref 3.5–5.1)
Sodium: 138 mmol/L (ref 135–145)
TOTAL PROTEIN: 6.6 g/dL (ref 6.5–8.1)

## 2018-06-23 NOTE — Progress Notes (Signed)
Patient here for follow up. He states he feel a little better.

## 2018-06-23 NOTE — Telephone Encounter (Signed)
Called report [place] Neg for DVT  IMPRESSION: No evidence of left upper extremity DVT.   Electronically Signed   By: Marybelle Killings M.D.   On: 06/23/2018 15:37   [name] informed.

## 2018-06-24 LAB — THYROID PANEL WITH TSH
FREE THYROXINE INDEX: 1.9 (ref 1.2–4.9)
T3 UPTAKE RATIO: 32 % (ref 24–39)
T4 TOTAL: 5.8 ug/dL (ref 4.5–12.0)
TSH: 2.86 u[IU]/mL (ref 0.450–4.500)

## 2018-06-24 NOTE — Progress Notes (Signed)
ADEL, BURCH ([id number]) Visit Report for 06/22/2018 Chief Complaint Document Details Patient Name: Louis Davies, Louis Davies Date of Service: 06/22/2018 2:15 PM Medical Record Number: [id number] Patient Account Number: [id number] Date of Birth/Sex: 07-20-1951 (67 y.o. Male) Treating RN: Montey Hora Primary Care Provider: Alvera Singh Other Clinician: Referring Provider: Alvera Singh Treating Provider/Extender: Melburn Hake, Devan Babino Weeks in Treatment: 1 Information Obtained from: Patient Chief Complaint Left axillary metastatic ulcer Electronic Signature(s) Signed: 06/23/2018 11:11:19 AM By: Worthy Keeler PA-C Entered By: Worthy Keeler on 06/22/2018 14:17:02 Louis Davies ([id number]) -------------------------------------------------------------------------------- HPI Details Patient Name: Louis Davies Date of Service: 06/22/2018 2:15 PM Medical Record Number: [id number] Patient Account Number: [id number] Date of Birth/Sex: November 26, 1950 (67 y.o. 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 The patient following excision did undergo radiation therapy which he has completed at this point. He is still on oral chemotherapy at this time however. Patient states that he does have some discomfort at the site but one of the biggest issues is actually the malodorous smell that is emanating from the wound that he states make some very self-conscious. He also did have an infection recently that he just completed oral antibiotic therapy for he was unsure of the antibiotic that he was on. He  states that just ended yesterday. Nonetheless No fevers, chills, nausea, or vomiting noted at this time. 06/22/18 on evaluation today patient's ulcer in the left axillary region actually has a fairly strong odor even I feel more so than last week at this point. He has been tolerating the dressing changes without complication. With that being said I think we may want to culture this to ensure there is no infection I know he's recently been on antibiotics before I first saw him but nonetheless I feel like this is going to be likely necessary in order to try to help control some of the older especially if it's coming from an infection Electronic Signature(s) Signed: 06/23/2018 11:11:19 AM By: Worthy Keeler PA-C Entered By: Worthy Keeler on 06/23/2018 11:04:28 Louis Davies ([id number]) -------------------------------------------------------------------------------- Physical Exam Details Patient Name: Louis Davies Date of Service: 06/22/2018 2:15 PM Medical Record Number: [id number] Patient Account Number: [id number] Date of Birth/Sex: August 07, 1951 (67 y.o. Male) Treating RN: Montey Hora Primary Care Provider: Alvera Singh Other Clinician: Referring Provider: Alvera Singh Treating Provider/Extender: STONE III, Bobbi Kozakiewicz Weeks in Treatment: 1 Constitutional Well-nourished and well-hydrated in no acute distress. Respiratory normal breathing without difficulty. clear to auscultation bilaterally. Cardiovascular regular rate and rhythm with normal S1, S2. Psychiatric this patient is able to make decisions and demonstrates good insight into disease process. Alert and Oriented x 3. pleasant and cooperative. Notes Patient's wound actually looks to be about the same at this point in time. Nonetheless I really feel like that there is a lot more going on internally than will be conceived externally. Externally I don't think it appears to badly. Nonetheless my suggestion at this point is  to obtain a wound culture which was obtained today for further evaluation. Electronic Signature(s) Signed: 06/23/2018 11:11:19 AM By: Worthy Keeler PA-C Entered By: Worthy Keeler on 06/23/2018 11:04:57 Louis Davies ([id number]) -------------------------------------------------------------------------------- Physician  Orders Details Patient Name: Louis Davies, Louis Davies Date of Service: 06/22/2018 2:15 PM Medical Record Number: [id number] Patient Account Number: [id number] Date of Birth/Sex: 06/14/1951 (67 y.o. 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 o May Shower, gently pat wound dry prior to applying new dressing. - wash with hibiclens Anesthetic (add to Medication List) Wound #1 Left Axilla o Topical Lidocaine 4% cream applied to wound bed prior to debridement (In Clinic Only). Skin Barriers/Peri-Wound Care Wound #1 Left Axilla o Skin Prep Primary Wound Dressing Wound #1 Left Axilla o Silver Alginate - rope Secondary Dressing o ABD pad o Other - carboflex Dressing Change Frequency Wound #1 Left Axilla o Change dressing every day. Follow-up Appointments Wound #1 Left Axilla o Return Appointment in 1 week. Additional Orders / Instructions Wound #1 Left Axilla o Increase protein intake. Laboratory o Bacteria identified in Wound by Culture (MICRO) Louis Davies, Louis Davies ([id number]) oooo Louis Davies Louis Davies: [phone] oooo Convenience Name: Wound culture routine Electronic  Signature(s) Signed: 06/22/2018 5:51:08 PM By: Montey Hora Signed: 06/23/2018 11:11:19 AM By: Worthy Keeler PA-C Entered By: Montey Hora on 06/22/2018 15:57:01 Louis Davies ([id number]) -------------------------------------------------------------------------------- Problem List Details Patient Name: Louis Davies Date of Service: 06/22/2018 2:15 PM Medical Record Number: [id number] Patient Account Number: [id number] Date of Birth/Sex: Dec 29, 1950 (67 y.o. 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 The patient following excision did undergo radiation therapy which he has completed at this point. He is still on oral chemotherapy at this time however. Patient states that he does have some discomfort at the site but one of the biggest issues is actually the malodorous smell that is emanating from the wound that he states make some very self-conscious. He also did have an infection recently that he just completed oral antibiotic therapy for he was unsure of the antibiotic that he was on. He states that just ended yesterday. Nonetheless No fevers, chills, nausea, or vomiting noted at this time. 06/22/18 on evaluation  today patient's ulcer in the left axillary region actually has a fairly strong odor even I feel more so than last week at this point. He has been tolerating the dressing changes without complication. With that being said I think we may want to culture this to ensure there is no infection I know he's recently been on antibiotics before I first saw him but nonetheless I feel like this is going to be likely necessary in order to try to help control some of the older especially if it's coming from an infection Patient History Information obtained from Patient. Family History Cancer - Father, Diabetes - Mother, Hypertension - Mother, No family history of Heart Disease, Kidney Disease, Lung Disease, Stroke, Thyroid Problems, Tuberculosis. Social History Never smoker, Marital Status - Divorced, Alcohol Use - Never, Drug Use - No History, Caffeine Use - Rarely. Medical And Surgical History Notes Constitutional Symptoms (General Health) Radiation and Chemo for Metastatic Melanoma Review of Systems (ROS) Constitutional Symptoms (General Health) Denies complaints or symptoms of Fever, Chills. Respiratory The patient has no complaints or symptoms. Cardiovascular The patient has no complaints or  symptoms. Psychiatric The patient has no complaints or symptoms. JAUAN, WOHL ([id number]) Objective Constitutional Well-nourished and well-hydrated in no acute distress. Vitals Time Taken: 2:30 PM, Height: 71 in, Weight: 185.6 lbs, BMI: 25.9, Temperature: 99.5 F, Pulse: 64 bpm, Respiratory Rate: 16 breaths/min, Blood Pressure: 121/59 mmHg. Respiratory normal breathing without difficulty. clear to auscultation bilaterally. Cardiovascular regular rate and rhythm with normal S1, S2. Psychiatric this patient is able to make decisions and demonstrates good insight into disease process. Alert and Oriented x 3. pleasant and cooperative. General Notes: Patient's wound actually looks to be about the same at this point in time. Nonetheless I really feel like that there is a lot more going on internally than will be conceived externally. Externally I don't think it appears to badly. Nonetheless my suggestion at this point is to obtain a wound culture which was obtained today for further evaluation. Integumentary (Hair, Skin) Wound #1 status is Open. Original cause of wound was Gradually Appeared. The wound is located on the Left Axilla. The wound measures 1cm length x 1cm width x 2.5cm depth; 0.785cm^2 area and 1.963cm^3 volume. There is Fat Layer (Subcutaneous Tissue) Exposed exposed. There is no tunneling noted, however, there is undermining starting at 2:00 and ending at 4:00 with a maximum distance of 3cm. There is a large amount of purulent drainage noted. Foul odor after cleansing was noted. The wound margin is flat and intact. There is no granulation within the wound bed. There is a large (67- 100%) amount of necrotic tissue within the wound bed including Adherent Slough. The periwound skin appearance exhibited: Rash, Erythema. The periwound skin appearance did not exhibit: Callus, Crepitus, Excoriation, Induration, Scarring, Atrophie Blanche, Cyanosis, Ecchymosis, Hemosiderin Staining,  Mottled, Pallor, Rubor. The surrounding wound skin color is noted with erythema which is circumferential. 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 ([id number]) Wound Cleansing: Wound #1 Left Axilla: Clean wound with Normal Saline. May Shower, gently pat wound dry prior to applying new dressing. - wash with hibiclens Anesthetic (add to Medication List): Wound #1 Left Axilla: Topical Lidocaine 4% cream applied to wound bed prior to debridement (In Clinic Only). Skin Barriers/Peri-Wound Care: Wound #1 Left Axilla: Skin Prep Primary Wound Dressing: Wound #1  Left Axilla: Silver Alginate - rope Secondary Dressing: ABD pad Other - carboflex Dressing Change Frequency: Wound #1 Left Axilla: Change dressing every day. Follow-up Appointments: Wound #1 Left Axilla: Return Appointment in 1 week. Additional Orders / Instructions: Wound #1 Left Axilla: Increase protein intake. Laboratory ordered were: Wound culture routine I'm gonna suggest at this point that we await the culture were to initiate any antibiotics as necessary. I did obtain a deep culture specimen from the wound and we will see what the shows. If anything changes in the meantime he'll let me know otherwise we will reevaluate in one week. Please see above for specific wound care orders. We will see patient for re-evaluation in 1 week(s) here in the clinic. If anything worsens or changes patient will contact our office for additional recommendations. Electronic Signature(s) Signed: 06/23/2018 11:11:19 AM By: Worthy Keeler PA-C Entered By: Worthy Keeler on 06/23/2018 11:05:09 Louis Davies ([id number]) -------------------------------------------------------------------------------- ROS/PFSH Details Patient Name: Louis Davies Date of Service: 06/22/2018 2:15 PM Medical Record Number: [id number] Patient Account Number: [id number] Date of Birth/Sex: 1951-10-09 (67 y.o. Male) Treating RN: Montey Hora Primary Care Provider: Alvera Singh Other Clinician: Referring Provider: Alvera Singh Treating Provider/Extender: Melburn Hake, Keithon Mccoin Weeks in Treatment: 1 Information Obtained From Patient Wound History Do you currently have one or more open woundso Yes How many open wounds do you currently haveo 1 Approximately how long have you had your woundso 2 months How have you been treating your wound(s) until nowo bandage Has your wound(s) ever healed and then re-openedo No Have you had any lab work done in the past montho Yes Have you tested positive for an antibiotic resistant organism (MRSA, VRE)o No Have you tested positive for osteomyelitis (bone infection)o No Have you had any tests for circulation on your legso No Have you had other problems associated with your woundso Infection, Swelling Constitutional Symptoms (General Health) Complaints and Symptoms: Negative for: Fever; Chills Medical History: Past Medical History Notes: Radiation and Chemo for Metastatic Melanoma Eyes Medical History: Negative for: Cataracts; Glaucoma; Optic Neuritis Ear/Nose/Mouth/Throat Medical History: Negative for: Chronic sinus problems/congestion; Middle ear problems Hematologic/Lymphatic Medical History: Negative for: Anemia; Hemophilia; Human Immunodeficiency Virus; Lymphedema; Sickle Cell Disease Respiratory Complaints and Symptoms: No Complaints or Symptoms Medical History: Negative for: Aspiration; Asthma; Chronic Obstructive Pulmonary Disease (COPD); Pneumothorax; Sleep Apnea; Tuberculosis Cardiovascular RYDER, MAN ([id number]) Complaints and Symptoms: No Complaints or Symptoms Medical History: Negative for: Angina; Arrhythmia; Congestive Heart Failure; Coronary Artery Disease; Deep Vein  Thrombosis; Hypertension; Hypotension; Myocardial Infarction; Peripheral Arterial Disease; Peripheral Venous Disease; Phlebitis; Vasculitis Gastrointestinal Medical History: Negative for: Cirrhosis ; Colitis; Crohnos; Hepatitis A; Hepatitis B; Hepatitis C Endocrine Medical History: Negative for: Type I Diabetes; Type II Diabetes Genitourinary Medical History: Negative for: End Stage Renal Disease Immunological Medical History: Negative for: Lupus Erythematosus; Raynaudos; Scleroderma Integumentary (Skin) Medical History: Negative for: History of Burn; History of pressure wounds Musculoskeletal Medical History: Positive for: Osteoarthritis - left knee Negative for: Gout; Rheumatoid Arthritis; Osteomyelitis Neurologic Medical History: Negative for: Dementia; Neuropathy; Quadriplegia; Paraplegia; Seizure Disorder Oncologic Medical History: Positive for: Received Chemotherapy; Received Radiation - Axillary Psychiatric Complaints and Symptoms: No Complaints or Symptoms Medical History: Negative for: Anorexia/bulimia; Confinement Anxiety Immunizations Pneumococcal VaccineBADR, PIEDRA ([id number]) Received Pneumococcal Vaccination: Yes Implantable Devices Family and Social History Cancer: Yes - Father; Diabetes: Yes - Mother; Heart Disease: No; Hypertension: Yes - Mother; Kidney Disease: No; Lung Disease: No; Stroke: No; Thyroid Problems: No; Tuberculosis: No; Never smoker;  Marital Status - Divorced; Alcohol Use: Never; Drug Use: No History; Caffeine Use: Rarely; Living Will: Yes (Copy provided) Physician Affirmation I have reviewed and agree with the above information. Electronic Signature(s) Signed: 06/23/2018 11:11:19 AM By: Worthy Keeler PA-C Signed: 06/23/2018 4:57:10 PM By: Montey Hora Entered By: Worthy Keeler on 06/23/2018 11:04:44 Louis Davies ([id number]) -------------------------------------------------------------------------------- SuperBill  Details Patient Name: Louis Davies Date of Service: 06/22/2018 Medical Record Number: [id number] Patient Account Number: [id number] Date of Birth/Sex: 1950/11/23 (67 y.o. 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

## 2018-06-24 NOTE — Progress Notes (Signed)
Louis Davies, Louis Davies ([id number]) Visit Report for 06/22/2018 Arrival Information Details Patient Name: Louis Davies, Louis Davies Date of Service: 06/22/2018 2:15 PM Medical Record Number: [id number] Patient Account Number: [id number] Date of Birth/Sex: 03-12-1951 (67 y.o. Male) Treating RN: Secundino Ginger Primary Care Laurice Kimmons: Alvera Singh Other Clinician: Referring Danon Lograsso: Alvera Singh Treating Treasure Ingrum/Extender: Melburn Hake, HOYT Weeks in Treatment: 1 Visit Information History Since Last Visit Added or deleted any medications: No Patient Arrived: Ambulatory Any new allergies or adverse reactions: No Arrival Time: 14:28 Had a fall or experienced change in No Accompanied By: self activities of daily living that may affect Transfer Assistance: None risk of falls: Patient Identification Verified: Yes Signs or symptoms of abuse/neglect since last visito No Secondary Verification Process Completed: Yes Hospitalized since last visit: No Has Dressing in Place as Prescribed: Yes Pain Present Now: No Electronic Signature(s) Signed: 06/23/2018 9:53:02 AM By: Secundino Ginger Entered By: Secundino Ginger on 06/22/2018 14:29:07 Louis Davies ([id number]) -------------------------------------------------------------------------------- Clinic Level of Care Assessment Details Patient Name: Louis Davies Date of Service: 06/22/2018 2:15 PM Medical Record Number: [id number] Patient Account Number: [id number] Date of Birth/Sex: 1951/07/26 (67 y.o. Male) Treating RN: Montey Hora Primary Care Glendell Schlottman: Alvera Singh Other Clinician: Referring Zilpha Mcandrew: Alvera Singh Treating Skyann Ganim/Extender: STONE III, HOYT Weeks in Treatment: 1 Clinic Level of Care Assessment Items TOOL 4 Quantity Score []  - Use when only an EandM is performed on FOLLOW-UP visit 0 ASSESSMENTS - Nursing Assessment / Reassessment X - Reassessment of Co-morbidities (includes updates in patient status) 1 10 X- 1 5 Reassessment of  Adherence to Treatment Plan ASSESSMENTS - Wound and Skin Assessment / Reassessment X - Simple Wound Assessment / Reassessment - one wound 1 5 []  - 0 Complex Wound Assessment / Reassessment - multiple wounds []  - 0 Dermatologic / Skin Assessment (not related to wound area) ASSESSMENTS - Focused Assessment []  - Circumferential Edema Measurements - multi extremities 0 []  - 0 Nutritional Assessment / Counseling / Intervention []  - 0 Lower Extremity Assessment (monofilament, tuning fork, pulses) []  - 0 Peripheral Arterial Disease Assessment (using hand held doppler) ASSESSMENTS - Ostomy and/or Continence Assessment and Care []  - Incontinence Assessment and Management 0 []  - 0 Ostomy Care Assessment and Management (repouching, etc.) PROCESS - Coordination of Care X - Simple Patient / Family Education for ongoing care 1 15 []  - 0 Complex (extensive) Patient / Family Education for ongoing care []  - 0 Staff obtains Programmer, systems, Records, Test Results / Process Orders []  - 0 Staff telephones HHA, Nursing Homes / Clarify orders / etc []  - 0 Routine Transfer to another Facility (non-emergent condition) []  - 0 Routine Hospital Admission (non-emergent condition) []  - 0 New Admissions / Biomedical engineer / Ordering NPWT, Apligraf, etc. []  - 0 Emergency Hospital Admission (emergent condition) X- 1 10 Simple Discharge Coordination Louis Davies, Louis Davies ([id number]) []  - 0 Complex (extensive) Discharge Coordination PROCESS - Special Needs []  - Pediatric / Minor Patient Management 0 []  - 0 Isolation Patient Management []  - 0 Hearing / Language / Visual special needs []  - 0 Assessment of Community assistance (transportation, D/C planning, etc.) []  - 0 Additional assistance / Altered mentation []  - 0 Support Surface(s) Assessment (bed, cushion, seat, etc.) INTERVENTIONS - Wound Cleansing / Measurement X - Simple Wound Cleansing - one wound 1 5 []  - 0 Complex Wound Cleansing - multiple  wounds X- 1 5 Wound Imaging (photographs - any number of wounds) []  - 0 Wound Tracing (instead of photographs) X- 1 5 Simple Wound  Measurement - one wound []  - 0 Complex Wound Measurement - multiple wounds INTERVENTIONS - Wound Dressings X - Small Wound Dressing one or multiple wounds 1 10 []  - 0 Medium Wound Dressing one or multiple wounds []  - 0 Large Wound Dressing one or multiple wounds []  - 0 Application of Medications - topical []  - 0 Application of Medications - injection INTERVENTIONS - Miscellaneous []  - External ear exam 0 X- 1 5 Specimen Collection (cultures, biopsies, blood, body fluids, etc.) []  - 0 Specimen(s) / Culture(s) sent or taken to Lab for analysis []  - 0 Patient Transfer (multiple staff / Civil Service fast streamer / Similar devices) []  - 0 Simple Staple / Suture removal (25 or less) []  - 0 Complex Staple / Suture removal (26 or more) []  - 0 Hypo / Hyperglycemic Management (close monitor of Blood Glucose) []  - 0 Ankle / Brachial Index (ABI) - do not check if billed separately X- 1 5 Vital Signs Louis Davies, Louis D. ([id number]) Has the patient been seen at the hospital within the last three years: Yes Total Score: 80 Level Of Care: New/Established - Level 3 Electronic Signature(s) Signed: 06/22/2018 5:51:08 PM By: Montey Hora Entered By: Montey Hora on 06/22/2018 15:58:10 Louis Davies ([id number]) -------------------------------------------------------------------------------- Encounter Discharge Information Details Patient Name: Louis Davies Date of Service: 06/22/2018 2:15 PM Medical Record Number: [id number] Patient Account Number: [id number] Date of Birth/Sex: 08-Oct-1951 (67 y.o. Male) Treating RN: Cornell Barman Primary Care Kairo Laubacher: Alvera Singh Other Clinician: Referring Alila Sotero: Alvera Singh Treating Alizabeth Antonio/Extender: Melburn Hake, HOYT Weeks in Treatment: 1 Encounter Discharge Information Items Discharge Condition: Stable Ambulatory  Status: Ambulatory Discharge Destination: Home Transportation: Private Auto Accompanied By: self Schedule Follow-up Appointment: Yes Clinical Summary of Care: Electronic Signature(s) Signed: 06/22/2018 6:56:50 PM By: Gretta Cool, BSN, RN, CWS, Kim RN, BSN Entered By: Gretta Cool, BSN, RN, CWS, Kim on 06/22/2018 16:07:50 Louis Davies ([id number]) -------------------------------------------------------------------------------- Lower Extremity Assessment Details Patient Name: Louis Davies Date of Service: 06/22/2018 2:15 PM Medical Record Number: [id number] Patient Account Number: [id number] Date of Birth/Sex: 14-Dec-1950 (67 y.o. Male) Treating RN: Secundino Ginger Primary Care Murel Shenberger: Alvera Singh Other Clinician: Referring Anira Senegal: Alvera Singh Treating Harshika Mago/Extender: STONE III, HOYT Weeks in Treatment: 1 Electronic Signature(s) Signed: 06/23/2018 9:53:02 AM By: Secundino Ginger Entered By: Secundino Ginger on 06/22/2018 14:39:56 Louis Davies ([id number]) -------------------------------------------------------------------------------- Multi Wound Chart Details Patient Name: Louis Davies Date of Service: 06/22/2018 2:15 PM Medical Record Number: [id number] Patient Account Number: [id number] Date of Birth/Sex: 1951-09-25 (67 y.o. 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] [N/A:N/A] Comorbid History: [1:Osteoarthritis, Received Chemotherapy, Received Radiation] [N/A:N/A] Date Acquired: [1:04/07/2018]  [N/A:N/A] Weeks of Treatment: [1:1] [N/A:N/A] Wound Status: [1:Open] [N/A:N/A] Measurements L x W x D [1:1x1x2.5] [N/A:N/A] (cm) Area (cm) : [1:0.785] [N/A:N/A] Volume (cm) : [1:1.963] [N/A:N/A] % Reduction in Area: [1:-0.90%] [N/A:N/A] % Reduction in Volume: [1:-5.20%] [N/A:N/A] Starting Position 1 [1:2] (o'clock): Ending Position 1 [1:4] (o'clock): Maximum Distance 1 (cm): [1:3] Undermining: [1:Yes] [N/A:N/A] Classification: [1:Full Thickness Without Exposed Support Structures] [N/A:N/A] Exudate Amount: [1:Large] [N/A:N/A] Exudate Type: [1:Purulent] [N/A:N/A] Exudate Color: [1:yellow, brown, green] [N/A:N/A] Foul Odor After Cleansing: [1:Yes] [N/A:N/A] Odor Anticipated Due to [1:No] [N/A:N/A] Product Use:  Wound Margin: [1:Flat and Intact] [N/A:N/A] Granulation Amount: [1:None Present (0%)] [N/A:N/A] Necrotic Amount: [1:Large (67-100%)] [N/A:N/A] Exposed Structures: [1:Fat Layer (Subcutaneous Tissue) Exposed: Yes Fascia: No Tendon: No] [N/A:N/A] Muscle: No Joint: No Bone: No Epithelialization: None N/A N/A Periwound Skin Texture: Rash: Yes N/A N/A Excoriation: No Induration: No Callus: No Crepitus: No Scarring: No Periwound Skin Moisture: No Abnormalities Noted N/A N/A Periwound Skin Color: Erythema: Yes N/A N/A Atrophie Blanche: No Cyanosis: No Ecchymosis: No Hemosiderin Staining: No Mottled: No Pallor: No Rubor: No Erythema Location: Circumferential N/A N/A Tenderness on Palpation: No N/A N/A Wound Preparation: Ulcer Cleansing: N/A N/A Rinsed/Irrigated with Saline Topical Anesthetic Applied: None Treatment Notes Electronic Signature(s) Signed: 06/22/2018 5:51:08 PM By: Montey Hora Entered By: Montey Hora on 06/22/2018 15:52:51 Louis Davies ([id number]) -------------------------------------------------------------------------------- Multi-Disciplinary Care Plan Details Patient Name: Louis Davies Date of Service: 06/22/2018 2:15 PM Medical  Record Number: [id number] Patient Account Number: [id number] Date of Birth/Sex: 04-05-1951 (67 y.o. Male) Treating RN: Montey Hora Primary Care Graylee Arutyunyan: Alvera Singh Other Clinician: Referring Epimenio Schetter: Alvera Singh Treating Joycelyn Liska/Extender: STONE III, HOYT Weeks in Treatment: 1 Active Inactive ` Abuse / Safety / Falls / Self Care Management Nursing Diagnoses: Potential for falls Goals: Patient will not experience any injury related to falls Date Initiated: 06/15/2018 Target Resolution Date: 09/25/2018 Goal Status: Active Interventions: Assess Activities of Daily Living upon admission and as needed Assess fall risk on admission and as needed Assess: immobility, friction, shearing, incontinence upon admission and as needed Assess impairment of mobility on admission and as needed per policy Notes: ` Nutrition Nursing Diagnoses: Imbalanced nutrition Goals: Patient/caregiver agrees to and verbalizes understanding of need to use nutritional supplements and/or vitamins as prescribed Date Initiated: 06/15/2018 Target Resolution Date: 09/25/2018 Goal Status: Active Interventions: Assess patient nutrition upon admission and as needed per policy Notes: ` Orientation to the Wound Care Program Nursing Diagnoses: Knowledge deficit related to the wound healing center program Goals: Patient/caregiver will verbalize understanding of the [facility] Date Initiated: 06/15/2018 Target Resolution Date: 06/26/2018 Louis Davies, Louis Davies ([id number]) Goal Status: Active Interventions: Provide education on orientation to the wound center Notes: ` Wound/Skin Impairment Nursing Diagnoses: Impaired tissue integrity Knowledge deficit related to ulceration/compromised skin integrity Goals: Ulcer/skin breakdown will have a volume reduction of 80% by week 12 Date Initiated: 06/15/2018 Target Resolution Date: 09/18/2018 Goal Status: Active Interventions: Assess  patient/caregiver ability to perform ulcer/skin care regimen upon admission and as needed Assess ulceration(s) every visit Notes: Electronic Signature(s) Signed: 06/22/2018 5:51:08 PM By: Montey Hora Entered By: Montey Hora on 06/22/2018 15:52:39 Louis Davies ([id number]) -------------------------------------------------------------------------------- Pain Assessment Details Patient Name: Louis Davies Date of Service: 06/22/2018 2:15 PM Medical Record Number: [id number] Patient Account Number: [id number] Date of Birth/Sex: 08/03/1951 (67 y.o. Male) Treating RN: Secundino Ginger Primary Care Bostyn Kunkler: Alvera Singh Other Clinician: Referring Axil Copeman: Alvera Singh Treating Beadie Matsunaga/Extender: Melburn Hake, HOYT Weeks in Treatment: 1 Active Problems Location of Pain Severity and Description of Pain Patient Has Paino No Site Locations Pain Management and Medication Current Pain Management: Goals for Pain Management pt denies any pain at this time. Electronic Signature(s) Signed: 06/23/2018 9:53:02 AM By: Secundino Ginger Entered By: Secundino Ginger on 06/22/2018 14:29:30 Louis Davies ([id number]) -------------------------------------------------------------------------------- Patient/Caregiver Education Details Patient Name: Louis Davies Date of Service: 06/22/2018 2:15 PM Medical Record Number: [id number] Patient Account Number: [id number] Date of Birth/Gender: 28-Dec-1950 (67 y.o. Male) Treating RN: Cornell Barman Primary Care Physician: Alvera Singh Other Clinician: Referring Physician: Alvera Singh Treating Physician/Extender: Joaquim Lai  III, HOYT Weeks in Treatment: 1 Education Assessment Education Provided To: Patient Education Topics Provided Malignant/Atypical Wounds: Handouts: Malignant/Atypical Wounds Methods: Demonstration, Explain/Verbal Responses: State content correctly Electronic Signature(s) Signed: 06/22/2018 6:56:50 PM By: Gretta Cool, BSN, RN, CWS, Kim RN,  BSN Entered By: Gretta Cool, BSN, RN, CWS, Kim on 06/22/2018 17:32:41 Louis Davies ([id number]) -------------------------------------------------------------------------------- Wound Assessment Details Patient Name: Louis Davies Date of Service: 06/22/2018 2:15 PM Medical Record Number: [id number] Patient Account Number: [id number] Date of Birth/Sex: [birth year] (67 y.o. Male) Treating RN: Secundino Ginger Primary Care Thersa Mohiuddin: Alvera Singh Other Clinician: Referring Alanmichael Barmore: Alvera Singh Treating Wisam Siefring/Extender: Melburn Hake, HOYT Weeks in Treatment: 1 Wound Status Wound Number: 1 Primary Malignant Wound Etiology: Wound Location: Left Axilla Wound Status: Open Wounding Event: Gradually Appeared Comorbid Osteoarthritis, Received Chemotherapy, Date Acquired: 04/07/2018 History: Received Radiation Weeks Of Treatment: 1 Clustered Wound: No Photos Photo Uploaded By: Secundino Ginger on 06/22/2018 16:32:25 Wound Measurements Length: (cm) 1 % Reduct Width: (cm) 1 % Reduct Depth: (cm) 2.5 Epitheli Area: (cm) 0.785 Tunneli Volume: (cm) 1.963 Undermi Start Endin Maxim ion in Area: -0.9% ion in Volume: -5.2% alization: None ng: No ning: Yes ing Position (o'clock): 2 g Position (o'clock): 4 um Distance: (cm) 3 Wound Description Full Thickness Without Exposed Support Foul Odo Classification: Structures Due to P Wound Margin: Flat and Intact Slough/F Exudate Large Amount: Exudate Type: Purulent Exudate Color: yellow, brown, green r After Cleansing: Yes roduct Use: No ibrino Yes Wound Bed Granulation Amount: None Present (0%) Exposed Structure Necrotic Amount: Large (67-100%) Fascia Exposed: No Necrotic Quality: Adherent Slough Fat Layer (Subcutaneous Tissue) Exposed: Yes Louis Davies, Louis Davies ([id number]) Tendon Exposed: No Muscle Exposed: No Joint Exposed: No Bone Exposed: No Periwound Skin Texture Texture Color No Abnormalities Noted: No No Abnormalities Noted:  No Callus: No Atrophie Blanche: No Crepitus: No Cyanosis: No Excoriation: No Ecchymosis: No Induration: No Erythema: Yes Rash: Yes Erythema Location: Circumferential Scarring: No Hemosiderin Staining: No Mottled: No Moisture Pallor: No No Abnormalities Noted: No Rubor: No Wound Preparation Ulcer Cleansing: Rinsed/Irrigated with Saline Topical Anesthetic Applied: None Treatment Notes Wound #1 (Left Axilla) 4. Dressing Applied: Other dressing (specify in notes) Notes Packed with silvercell, carboflex and ABD secured with tape. Electronic Signature(s) Signed: 06/23/2018 9:53:02 AM By: Secundino Ginger Entered By: Secundino Ginger on 06/22/2018 14:39:46 Louis Davies ([id number]) -------------------------------------------------------------------------------- Vitals Details Patient Name: Louis Davies Date of Service: 06/22/2018 2:15 PM Medical Record Number: [id number] Patient Account Number: [id number] Date of Birth/Sex: [birth year] (67 y.o. Male) Treating RN: Secundino Ginger Primary Care Analyse Angst: Alvera Singh Other Clinician: Referring Leonda Cristo: Alvera Singh Treating Brentley Landfair/Extender: STONE III, HOYT Weeks in Treatment: 1 Vital Signs Time Taken: 14:30 Temperature (F): 99.5 Height (in): 71 Pulse (bpm): 64 Weight (lbs): 185.6 Respiratory Rate (breaths/min): 16 Body Mass Index (BMI): 25.9 Blood Pressure (mmHg): 121/59 Reference Range: 80 - 120 mg / dl Electronic Signature(s) Signed: 06/23/2018 9:53:02 AM By: Secundino Ginger Entered By: Secundino Ginger on 06/22/2018 14:31:58

## 2018-06-25 LAB — AEROBIC CULTURE  (SUPERFICIAL SPECIMEN)

## 2018-06-25 LAB — AEROBIC CULTURE W GRAM STAIN (SUPERFICIAL SPECIMEN)

## 2018-06-28 ENCOUNTER — Encounter: Payer: Medicare Other | Admitting: Physician Assistant

## 2018-06-28 DIAGNOSIS — L98495 Non-pressure chronic ulcer of skin of other sites with muscle involvement without evidence of necrosis: Secondary | ICD-10-CM | POA: Diagnosis not present

## 2018-06-28 DIAGNOSIS — C773 Secondary and unspecified malignant neoplasm of axilla and upper limb lymph nodes: Secondary | ICD-10-CM | POA: Diagnosis not present

## 2018-06-28 DIAGNOSIS — Z833 Family history of diabetes mellitus: Secondary | ICD-10-CM | POA: Diagnosis not present

## 2018-06-28 DIAGNOSIS — C801 Malignant (primary) neoplasm, unspecified: Secondary | ICD-10-CM | POA: Diagnosis not present

## 2018-06-28 DIAGNOSIS — L98492 Non-pressure chronic ulcer of skin of other sites with fat layer exposed: Secondary | ICD-10-CM | POA: Diagnosis not present

## 2018-06-28 DIAGNOSIS — Z809 Family history of malignant neoplasm, unspecified: Secondary | ICD-10-CM | POA: Diagnosis not present

## 2018-06-28 DIAGNOSIS — Z9221 Personal history of antineoplastic chemotherapy: Secondary | ICD-10-CM | POA: Diagnosis not present

## 2018-06-28 DIAGNOSIS — B952 Enterococcus as the cause of diseases classified elsewhere: Secondary | ICD-10-CM | POA: Diagnosis not present

## 2018-06-28 DIAGNOSIS — M1990 Unspecified osteoarthritis, unspecified site: Secondary | ICD-10-CM | POA: Diagnosis not present

## 2018-06-29 ENCOUNTER — Other Ambulatory Visit: Payer: Self-pay

## 2018-06-29 ENCOUNTER — Ambulatory Visit: Payer: Medicare Other | Attending: Oncology | Admitting: Occupational Therapy

## 2018-06-29 ENCOUNTER — Encounter: Payer: Self-pay | Admitting: Occupational Therapy

## 2018-06-29 DIAGNOSIS — I890 Lymphedema, not elsewhere classified: Secondary | ICD-10-CM | POA: Diagnosis not present

## 2018-06-29 DIAGNOSIS — E8989 Other postprocedural endocrine and metabolic complications and disorders: Secondary | ICD-10-CM | POA: Insufficient documentation

## 2018-06-29 DIAGNOSIS — I972 Postmastectomy lymphedema syndrome: Secondary | ICD-10-CM | POA: Diagnosis not present

## 2018-06-29 NOTE — Progress Notes (Signed)
TAJAH, SCHREINER ([id number]) Visit Report for 06/28/2018 Arrival Information Details Patient Name: Louis Davies, Louis Davies Date of Service: 06/28/2018 2:00 PM Medical Record Number: [id number] Patient Account Number: [id number] Date of Birth/Sex: 1951/04/30 (66 y.o. M) Treating RN: Secundino Ginger Primary Care Blanka Rockholt: Alvera Singh Other Clinician: Referring Tiwanna Tuch: Alvera Singh Treating Alycen Mack/Extender: Melburn Hake, HOYT Weeks in Treatment: 1 Visit Information History Since Last Visit Added or deleted any medications: No Patient Arrived: Ambulatory Any new allergies or adverse reactions: No Arrival Time: 13:59 Had a fall or experienced change in No Accompanied By: self activities of daily living that may affect Transfer Assistance: None risk of falls: Patient Identification Verified: Yes Signs or symptoms of abuse/neglect since last visito No Secondary Verification Process Completed: Yes Hospitalized since last visit: No Implantable device outside of the clinic excluding No cellular tissue based products placed in the center since last visit: Has Dressing in Place as Prescribed: Yes Pain Present Now: No Electronic Signature(s) Signed: 06/28/2018 3:37:51 PM By: Secundino Ginger Entered By: Secundino Ginger on 06/28/2018 14:00:45 Louis Davies ([id number]) -------------------------------------------------------------------------------- Clinic Level of Care Assessment Details Patient Name: Louis Davies Date of Service: 06/28/2018 2:00 PM Medical Record Number: [id number] Patient Account Number: [id number] Date of Birth/Sex: April 08, 1951 (66 y.o. M) Treating RN: Roger Shelter Primary Care Eugenio Dollins: Alvera Singh Other Clinician: Referring Allante Whitmire: Alvera Singh Treating Cara Thaxton/Extender: Melburn Hake, HOYT Weeks in Treatment: 1 Clinic Level of Care Assessment Items TOOL 4 Quantity Score X - Use when only an EandM is performed on FOLLOW-UP visit 1 0 ASSESSMENTS - Nursing Assessment  / Reassessment X - Reassessment of Co-morbidities (includes updates in patient status) 1 10 X- 1 5 Reassessment of Adherence to Treatment Plan ASSESSMENTS - Wound and Skin Assessment / Reassessment X - Simple Wound Assessment / Reassessment - one wound 1 5 []  - 0 Complex Wound Assessment / Reassessment - multiple wounds []  - 0 Dermatologic / Skin Assessment (not related to wound area) ASSESSMENTS - Focused Assessment []  - Circumferential Edema Measurements - multi extremities 0 []  - 0 Nutritional Assessment / Counseling / Intervention []  - 0 Lower Extremity Assessment (monofilament, tuning fork, pulses) []  - 0 Peripheral Arterial Disease Assessment (using hand held doppler) ASSESSMENTS - Ostomy and/or Continence Assessment and Care []  - Incontinence Assessment and Management 0 []  - 0 Ostomy Care Assessment and Management (repouching, etc.) PROCESS - Coordination of Care X - Simple Patient / Family Education for ongoing care 1 15 []  - 0 Complex (extensive) Patient / Family Education for ongoing care []  - 0 Staff obtains Programmer, systems, Records, Test Results / Process Orders []  - 0 Staff telephones HHA, Nursing Homes / Clarify orders / etc []  - 0 Routine Transfer to another Facility (non-emergent condition) []  - 0 Routine Hospital Admission (non-emergent condition) []  - 0 New Admissions / Biomedical engineer / Ordering NPWT, Apligraf, etc. []  - 0 Emergency Hospital Admission (emergent condition) []  - 0 Simple Discharge Coordination ABHI, MOCCIA ([id number]) []  - 0 Complex (extensive) Discharge Coordination PROCESS - Special Needs []  - Pediatric / Minor Patient Management 0 []  - 0 Isolation Patient Management []  - 0 Hearing / Language / Visual special needs []  - 0 Assessment of Community assistance (transportation, D/C planning, etc.) []  - 0 Additional assistance / Altered mentation []  - 0 Support Surface(s) Assessment (bed, cushion, seat, etc.) INTERVENTIONS -  Wound Cleansing / Measurement X - Simple Wound Cleansing - one wound 1 5 []  - 0 Complex Wound Cleansing - multiple wounds X- 1 5 Wound  Imaging (photographs - any number of wounds) []  - 0 Wound Tracing (instead of photographs) X- 1 5 Simple Wound Measurement - one wound []  - 0 Complex Wound Measurement - multiple wounds INTERVENTIONS - Wound Dressings X - Small Wound Dressing one or multiple wounds 1 10 []  - 0 Medium Wound Dressing one or multiple wounds []  - 0 Large Wound Dressing one or multiple wounds []  - 0 Application of Medications - topical []  - 0 Application of Medications - injection INTERVENTIONS - Miscellaneous []  - External ear exam 0 []  - 0 Specimen Collection (cultures, biopsies, blood, body fluids, etc.) []  - 0 Specimen(s) / Culture(s) sent or taken to Lab for analysis []  - 0 Patient Transfer (multiple staff / Civil Service fast streamer / Similar devices) []  - 0 Simple Staple / Suture removal (25 or less) []  - 0 Complex Staple / Suture removal (26 or more) []  - 0 Hypo / Hyperglycemic Management (close monitor of Blood Glucose) []  - 0 Ankle / Brachial Index (ABI) - do not check if billed separately X- 1 5 Vital Signs Vasey, Cosimo D. ([id number]) Has the patient been seen at the hospital within the last three years: Yes Total Score: 65 Level Of Care: New/Established - Level 2 Electronic Signature(s) Signed: 06/28/2018 4:39:16 PM By: Roger Shelter Entered By: Roger Shelter on 06/28/2018 14:31:54 Louis Davies ([id number]) -------------------------------------------------------------------------------- Encounter Discharge Information Details Patient Name: Louis Davies Date of Service: 06/28/2018 2:00 PM Medical Record Number: [id number] Patient Account Number: [id number] Date of Birth/Sex: 1951/07/21 (66 y.o. M) Treating RN: Montey Hora Primary Care Rushie Brazel: Alvera Singh Other Clinician: Referring Early Steel: Alvera Singh Treating  Joyelle Siedlecki/Extender: Melburn Hake, HOYT Weeks in Treatment: 1 Encounter Discharge Information Items Discharge Condition: Stable Ambulatory Status: Ambulatory Discharge Destination: Home Transportation: Private Auto Accompanied By: self Schedule Follow-up Appointment: Yes Clinical Summary of Care: Electronic Signature(s) Signed: 06/28/2018 3:17:32 PM By: Montey Hora Entered By: Montey Hora on 06/28/2018 15:17:31 Louis Davies ([id number]) -------------------------------------------------------------------------------- Lower Extremity Assessment Details Patient Name: Louis Davies Date of Service: 06/28/2018 2:00 PM Medical Record Number: [id number] Patient Account Number: [id number] Date of Birth/Sex: 1951/02/10 (66 y.o. M) Treating RN: Secundino Ginger Primary Care Maelys Kinnick: Alvera Singh Other Clinician: Referring Sander Remedios: Alvera Singh Treating Natori Gudino/Extender: Melburn Hake, HOYT Weeks in Treatment: 1 Electronic Signature(s) Signed: 06/28/2018 3:37:51 PM By: Secundino Ginger Entered By: Secundino Ginger on 06/28/2018 14:06:06 Louis Davies ([id number]) -------------------------------------------------------------------------------- Multi Wound Chart Details Patient Name: Louis Davies Date of Service: 06/28/2018 2:00 PM Medical Record Number: [id number] Patient Account Number: [id number] Date of Birth/Sex: [birth year] (66 y.o. M) Treating RN: Roger Shelter Primary Care Florella Mcneese: Alvera Singh Other Clinician: Referring Contrell Ballentine: Alvera Singh Treating Deiondre Harrower/Extender: STONE III, HOYT Weeks in Treatment: 1 Vital Signs Height(in): 71 Pulse(bpm): 72 Weight(lbs): 185.6 Blood Pressure(mmHg): 106/50 Body Mass Index(BMI): 26 Temperature(F): 98.8 Respiratory Rate 16 (breaths/min): Photos: [N/A:N/A] Wound Location: Left Axilla N/A N/A Wounding Event: Gradually Appeared N/A N/A Primary Etiology: Malignant Wound N/A N/A Comorbid History: Osteoarthritis, Received N/A  N/A Chemotherapy, Received Radiation Date Acquired: 04/07/2018 N/A N/A Weeks of Treatment: 1 N/A N/A Wound Status: Open N/A N/A Measurements L x W x D 1.6x1x3 N/A N/A (cm) Area (cm) : 1.257 N/A N/A Volume (cm) : 3.77 N/A N/A % Reduction in Area: -61.60% N/A N/A % Reduction in Volume: -102.00% N/A N/A Classification: Full Thickness Without N/A N/A Exposed Support Structures Exudate Amount: Large N/A N/A Exudate Type: Purulent N/A N/A Exudate Color: yellow, brown, green N/A N/A Foul Odor After Cleansing: Yes N/A  N/A Odor Anticipated Due to No N/A N/A Product Use: Wound Margin: Flat and Intact N/A N/A Granulation Amount: None Present (0%) N/A N/A Necrotic Amount: Large (67-100%) N/A N/A Exposed Structures: Fat Layer (Subcutaneous N/A N/A Tissue) Exposed: Yes Fascia: No Fetterolf, ASIR BINGLEY ([id number]) Tendon: No Muscle: No Joint: No Bone: No Epithelialization: None N/A N/A Periwound Skin Texture: Rash: Yes N/A N/A Excoriation: No Induration: No Callus: No Crepitus: No Scarring: No Periwound Skin Moisture: No Abnormalities Noted N/A N/A Periwound Skin Color: Erythema: Yes N/A N/A Atrophie Blanche: No Cyanosis: No Ecchymosis: No Hemosiderin Staining: No Mottled: No Pallor: No Rubor: No Erythema Location: Circumferential N/A N/A Tenderness on Palpation: No N/A N/A Wound Preparation: Ulcer Cleansing: N/A N/A Rinsed/Irrigated with Saline Topical Anesthetic Applied: None, Other: lidocaine 4% Treatment Notes Electronic Signature(s) Signed: 06/28/2018 4:39:16 PM By: Roger Shelter Entered By: Roger Shelter on 06/28/2018 14:29:03 Louis Davies ([id number]) -------------------------------------------------------------------------------- Multi-Disciplinary Care Plan Details Patient Name: Louis Davies Date of Service: 06/28/2018 2:00 PM Medical Record Number: [id number] Patient Account Number: [id number] Date of Birth/Sex: [birth year] (66 y.o. M) Treating RN:  Roger Shelter Primary Care Zelphia Glover: Alvera Singh Other Clinician: Referring Markevion Lattin: Alvera Singh Treating Sequan Auxier/Extender: Melburn Hake, HOYT Weeks in Treatment: 1 Active Inactive ` Abuse / Safety / Falls / Self Care Management Nursing Diagnoses: Potential for falls Goals: Patient will not experience any injury related to falls Date Initiated: 06/15/2018 Target Resolution Date: 09/25/2018 Goal Status: Active Interventions: Assess Activities of Daily Living upon admission and as needed Assess fall risk on admission and as needed Assess: immobility, friction, shearing, incontinence upon admission and as needed Assess impairment of mobility on admission and as needed per policy Notes: ` Nutrition Nursing Diagnoses: Imbalanced nutrition Goals: Patient/caregiver agrees to and verbalizes understanding of need to use nutritional supplements and/or vitamins as prescribed Date Initiated: 06/15/2018 Target Resolution Date: 09/25/2018 Goal Status: Active Interventions: Assess patient nutrition upon admission and as needed per policy Notes: ` Orientation to the Wound Care Program Nursing Diagnoses: Knowledge deficit related to the wound healing center program Goals: Patient/caregiver will verbalize understanding of the [facility] Program Date Initiated: 06/15/2018 Target Resolution Date: 06/26/2018 DESHAUN, WEISINGER ([id number]) Goal Status: Active Interventions: Provide education on orientation to the wound center Notes: ` Wound/Skin Impairment Nursing Diagnoses: Impaired tissue integrity Knowledge deficit related to ulceration/compromised skin integrity Goals: Ulcer/skin breakdown will have a volume reduction of 80% by week 12 Date Initiated: 06/15/2018 Target Resolution Date: 09/18/2018 Goal Status: Active Interventions: Assess patient/caregiver ability to perform ulcer/skin care regimen upon admission and as needed Assess ulceration(s) every  visit Notes: Electronic Signature(s) Signed: 06/28/2018 4:39:16 PM By: Roger Shelter Entered By: Roger Shelter on 06/28/2018 14:28:56 Louis Davies ([id number]) -------------------------------------------------------------------------------- Pain Assessment Details Patient Name: Louis Davies Date of Service: 06/28/2018 2:00 PM Medical Record Number: [id number] Patient Account Number: [id number] Date of Birth/Sex: 1951-03-31 (66 y.o. M) Treating RN: Secundino Ginger Primary Care Faiza Bansal: Alvera Singh Other Clinician: Referring Atonya Templer: Alvera Singh Treating Daya Dutt/Extender: Melburn Hake, HOYT Weeks in Treatment: 1 Active Problems Location of Pain Severity and Description of Pain Patient Has Paino No Site Locations Pain Management and Medication Current Pain Management: Electronic Signature(s) Signed: 06/28/2018 3:37:51 PM By: Secundino Ginger Entered By: Secundino Ginger on 06/28/2018 14:00:52 Louis Davies ([id number]) -------------------------------------------------------------------------------- Patient/Caregiver Education Details Patient Name: Louis Davies Date of Service: 06/28/2018 2:00 PM Medical Record Number: [id number] Patient Account Number: [id number] Date of Birth/Gender: Jul 01, 1951 (66 y.o. M) Treating RN: Montey Hora Primary Care Physician: Zachery Conch,  EMILY Other Clinician: Referring Physician: Alvera Singh Treating Physician/Extender: Melburn Hake, HOYT Weeks in Treatment: 1 Education Assessment Education Provided To: Patient Education Topics Provided Wound/Skin Impairment: Handouts: Other: wound care as ordered Methods: Demonstration, Explain/Verbal Responses: State content correctly Electronic Signature(s) Signed: 06/28/2018 5:04:31 PM By: Montey Hora Entered By: Montey Hora on 06/28/2018 15:17:55 Louis Davies ([id number]) -------------------------------------------------------------------------------- Wound Assessment Details Patient  Name: Louis Davies Date of Service: 06/28/2018 2:00 PM Medical Record Number: [id number] Patient Account Number: [id number] Date of Birth/Sex: 1951-06-04 (66 y.o. 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 Wound Status: Open Wounding Event: Gradually Appeared Comorbid Osteoarthritis, Received Chemotherapy, Date Acquired: 04/07/2018 History: Received Radiation Weeks Of Treatment: 1 Clustered Wound: No Photos Photo Uploaded By: Secundino Ginger on 06/28/2018 14:18:10 Wound Measurements Length: (cm) 1.6 Width: (cm) 1 Depth: (cm) 3 Area: (cm) 1.257 Volume: (cm) 3.77 % Reduction in Area: -61.6% % Reduction in Volume: -102% Epithelialization: None Tunneling: No Undermining: No Wound Description Full Thickness Without Exposed Support Foul Odo Classification: Structures Due to P Wound Margin: Flat and Intact Slough/F Exudate Large Amount: Exudate Type: Purulent Exudate Color: yellow, brown, green r After Cleansing: Yes roduct Use: No ibrino Yes Wound Bed Granulation Amount: None Present (0%) Exposed Structure Necrotic Amount: Large (67-100%) Fascia Exposed: No Necrotic Quality: Adherent Slough Fat Layer (Subcutaneous Tissue) Exposed: Yes Tendon Exposed: No Muscle Exposed: No Joint Exposed: No Bone Exposed: No Latour, Tayvin D. ([id number]) Periwound Skin Texture Texture Color No Abnormalities Noted: No No Abnormalities Noted: No Callus: No Atrophie Blanche: No Crepitus: No Cyanosis: No Excoriation: No Ecchymosis: No Induration: No Erythema: Yes Rash: Yes Erythema Location: Circumferential Scarring: No Hemosiderin Staining: No Mottled: No Moisture Pallor: No No Abnormalities Noted: No Rubor: No Wound Preparation Ulcer  Cleansing: Rinsed/Irrigated with Saline Topical Anesthetic Applied: None, Other: lidocaine 4%, Treatment Notes Wound #1 (Left Axilla) 1. Cleansed with: Clean wound with Normal Saline 2. Anesthetic Topical Lidocaine 4% cream to wound bed prior to debridement 4. Dressing Applied: Calcium Alginate with Silver Other dressing (specify in notes) 5. Secondary Dressing Applied ABD Pad 7. Secured with Tape Notes Packed with silvercell, carboflex Electronic Signature(s) Signed: 06/28/2018 3:37:51 PM By: Secundino Ginger Entered By: Secundino Ginger on 06/28/2018 14:12:03 Louis Davies ([id number]) -------------------------------------------------------------------------------- Vitals Details Patient Name: Louis Davies Date of Service: 06/28/2018 2:00 PM Medical Record Number: [id number] Patient Account Number: [id number] Date of Birth/Sex: 1951/04/23 (66 y.o. M) Treating RN: Secundino Ginger Primary Care Kenzly Rogoff: Alvera Singh Other Clinician: Referring Avy Barlett: Alvera Singh Treating Cecia Egge/Extender: STONE III, HOYT Weeks in Treatment: 1 Vital Signs Time Taken: 14:00 Temperature (F): 98.8 Height (in): 71 Pulse (bpm): 72 Weight (lbs): 185.6 Respiratory Rate (breaths/min): 16 Body Mass Index (BMI): 25.9 Blood Pressure (mmHg): 106/50 Reference Range: 80 - 120 mg / dl Electronic Signature(s) Signed: 06/28/2018 3:37:51 PM By: Secundino Ginger Entered By: Secundino Ginger on 06/28/2018 14:02:40

## 2018-06-29 NOTE — Patient Instructions (Signed)
Practice deep "belly breathing"  several times a day during visit interval to    1.  relax BUQ/BUE muscles and limit guarding postures, and  2.  to engage diaphragm in mobilizing proximal lymphatic congestion in lateral trunk, axilla and chest,.

## 2018-06-29 NOTE — Progress Notes (Signed)
DARRIO, BADE ([id number]) Visit Report for 06/28/2018 Chief Complaint Document Details Patient Name: Louis Davies, Louis Davies Date of Service: 06/28/2018 2:00 PM Medical Record Number: [id number] Patient Account Number: [id number] Date of Birth/Sex: 1950-11-15 (67 y.o. M) Treating RN: Roger Shelter Primary Care Provider: Alvera Singh Other Clinician: Referring Provider: Alvera Singh Treating Provider/Extender: Melburn Hake, Tiwana Chavis Weeks in Treatment: 1 Information Obtained from: Patient Chief Complaint Left axillary metastatic ulcer Electronic Signature(s) Signed: 06/29/2018 12:58:02 AM By: Worthy Keeler PA-C Entered By: Worthy Keeler on 06/28/2018 14:23:56 Louis Davies ([id number]) -------------------------------------------------------------------------------- HPI Details Patient Name: Louis Davies Date of Service: 06/28/2018 2:00 PM Medical Record Number: [id number] Patient Account Number: [id number] Date of Birth/Sex: 01-10-1951 (66 y.o. 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 The patient following excision did undergo radiation therapy which he has completed at this point. He is still on oral chemotherapy at this time however. Patient states that he does have some discomfort at the site but one of the biggest issues is actually the malodorous smell that is emanating from the wound that he states make some very self-conscious. He also did have an infection recently that he just completed oral antibiotic therapy for he was unsure of the antibiotic that he was on. He  states that just ended yesterday. Nonetheless No fevers, chills, nausea, or vomiting noted at this time. 06/22/18 on evaluation today patient's ulcer in the left axillary region actually has a fairly strong odor even I feel more so than last week at this point. He has been tolerating the dressing changes without complication. With that being said I think we may want to culture this to ensure there is no infection I know he's recently been on antibiotics before I first saw him but nonetheless I feel like this is going to be likely necessary in order to try to help control some of the older especially if it's coming from an infection 06/28/18 on evaluation today patient appears to be doing a little better in my pinion in regard to the drainage from the left axillary ulcer. He's been tolerating the dressing changes without complication. With that being said I do have his culture for review today which shows that it was positive for Enterococcus faecalis and is sensitive to ampicillin. With that being said he seems to be tolerating the dressing changes very well and in general I am pleased with what I'm seeing. I'm unsure of how much of the cancer may be remaining the patient also states that he will be having a pet scan at the end of this month on the 30th he believes. Electronic Signature(s) Signed: 06/29/2018 12:58:02 AM By: Worthy Keeler PA-C Entered By: Worthy Keeler on 06/28/2018 14:34:02 Louis Davies ([id number]) -------------------------------------------------------------------------------- Physical Exam Details Patient Name: Louis Davies Date of Service: 06/28/2018 2:00 PM Medical Record Number: [id number] Patient Account Number: [id number] Date of Birth/Sex: Nov 12, 1950 (66 y.o. M) Treating RN: Roger Shelter Primary Care Provider: Alvera Singh Other Clinician: Referring Provider: Alvera Singh Treating Provider/Extender: STONE III, Malynn Lucy Weeks in Treatment:  1 Constitutional Well-nourished and well-hydrated in no acute distress. Respiratory normal breathing without difficulty. clear to auscultation bilaterally. Cardiovascular  regular rate and rhythm with normal S1, S2. Psychiatric this patient is able to make decisions and demonstrates good insight into disease process. Alert and Oriented x 3. pleasant and cooperative. Notes On inspection at this time patient is showing signs of improvement which is good news. With that being said there still some older present at this point but it does not seem to be as significant as what I've noticed in the past. Nonetheless I am going to address the infection that was identified on culture. Electronic Signature(s) Signed: 06/29/2018 12:58:02 AM By: Worthy Keeler PA-C Entered By: Worthy Keeler on 06/28/2018 14:34:37 Louis Davies ([id number]) -------------------------------------------------------------------------------- Physician Orders Details Patient Name: Louis Davies Date of Service: 06/28/2018 2:00 PM Medical Record Number: [id number] Patient Account Number: [id number] Date of Birth/Sex: [birth year] (66 y.o. 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 o May Shower, gently pat wound dry prior to applying new dressing. - wash with hibiclens Anesthetic (add to Medication List) Wound #1 Left Axilla o Topical Lidocaine 4% cream applied to wound bed prior to debridement (In Clinic Only). Skin  Barriers/Peri-Wound Care Wound #1 Left Axilla o Skin Prep Primary Wound Dressing Wound #1 Left Axilla o Silver Alginate - rope Secondary Dressing o ABD pad o Other - carboflex Dressing Change Frequency Wound #1 Left Axilla o Change dressing every day. Follow-up Appointments Wound #1 Left Axilla o Return Appointment in 2 weeks. Additional Orders / Instructions Wound #1 Left Axilla o Increase protein intake. Patient Medications Allergies: No Known Drug Allergies JAYMIAN, BOGART ([id number]) Notifications Medication Indication Start End Augmentin 06/28/2018 DOSE 1 - oral 875 mg-125 mg tablet - 1 tablet oral taken 2 times a day for 14 days Electronic Signature(s) Signed: 06/28/2018 2:35:43 PM By: Worthy Keeler PA-C Entered By: Worthy Keeler on 06/28/2018 14:35:43 Louis Davies ([id number]) -------------------------------------------------------------------------------- Problem List Details Patient Name: Louis Davies Date of Service: 06/28/2018 2:00 PM Medical Record Number: [id number] Patient Account Number: [id number] Date of Birth/Sex: Dec 16, 1950 (66 y.o. 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 The patient following excision did undergo radiation therapy which he has completed at this point. He is still on oral chemotherapy at this time however. Patient states that he does have some discomfort at the site but one of the biggest issues is actually the malodorous smell that is emanating from the wound that he states make some very self-conscious. He also did have an infection recently that he just completed oral antibiotic therapy for he was unsure of the antibiotic that he was on. He states that just ended yesterday. Nonetheless No fevers, chills, nausea, or vomiting noted at this time. 06/22/18 on evaluation today patient's ulcer in the left axillary region actually has a fairly strong odor even I feel more so than last week at this point. He has been tolerating the dressing changes without complication. With that being said I think we may want to culture this to ensure there is no infection I know he's recently been on antibiotics before I first saw him but nonetheless I feel like this is going to be likely necessary in order to try to help control some of the older especially if it's coming from an infection 06/28/18 on evaluation today patient appears to be doing a little better in  my pinion in regard to the drainage from the left axillary ulcer. He's been tolerating the dressing changes without complication. With that being said I do have his culture for review today which shows that it was positive for Enterococcus faecalis and is sensitive to ampicillin. With that being said he seems to be tolerating the dressing changes very well and in general I am pleased with what I'm seeing. I'm unsure of how much of the cancer may be remaining the patient also states that he will be having a pet scan at the end of this month on the 30th he believes. Patient History Information obtained from Patient. Family History Cancer - Father, Diabetes - Mother, Hypertension - Mother, No family history of Heart Disease, Kidney Disease, Lung Disease, Stroke, Thyroid Problems, Tuberculosis. Social History Never smoker, Marital Status - Divorced, Alcohol Use - Never, Drug Use - No History, Caffeine Use - Rarely. Medical And Surgical History Notes Constitutional Symptoms (General Health) Radiation and Chemo for Metastatic Melanoma Review of Systems (ROS) Constitutional Symptoms (General Health) Denies complaints or symptoms of Fever, Chills. Respiratory The patient has no complaints or symptoms. ASIER, DESROCHES ([id number]) Cardiovascular The patient has no complaints or symptoms. Psychiatric The patient has no complaints or symptoms. Objective Constitutional Well-nourished and well-hydrated in no acute distress. Vitals Time Taken: 2:00 PM, Height: 71 in, Weight: 185.6 lbs, BMI: 25.9, Temperature: 98.8 F, Pulse: 72 bpm, Respiratory Rate: 16 breaths/min, Blood Pressure: 106/50 mmHg. Respiratory normal breathing without difficulty. clear to auscultation bilaterally. Cardiovascular regular rate and rhythm with normal S1, S2. Psychiatric this patient is able to make decisions and demonstrates good insight into disease process. Alert and Oriented x 3. pleasant and  cooperative. General Notes: On inspection at this time patient is showing signs of improvement which is good news. With that being said there still some older present at this point but it does not seem to be as significant as what I've noticed in the past. Nonetheless I am going to address the infection that was identified on culture. Integumentary (Hair, Skin) Wound #1 status is Open. Original cause of wound was Gradually Appeared. The wound is located on  the Left Axilla. The wound measures 1.6cm length x 1cm width x 3cm depth; 1.257cm^2 area and 3.77cm^3 volume. There is Fat Layer (Subcutaneous Tissue) Exposed exposed. There is no tunneling or undermining noted. There is a large amount of purulent drainage noted. Foul odor after cleansing was noted. The wound margin is flat and intact. There is no granulation within the wound bed. There is a large (67-100%) amount of necrotic tissue within the wound bed including Adherent Slough. The periwound skin appearance exhibited: Rash, Erythema. The periwound skin appearance did not exhibit: Callus, Crepitus, Excoriation, Induration, Scarring, Atrophie Blanche, Cyanosis, Ecchymosis, Hemosiderin Staining, Mottled, Pallor, Rubor. The surrounding wound skin color is noted with erythema which is circumferential. 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 ([id number]) Plan Wound Cleansing: Wound #1 Left Axilla: Clean wound with Normal Saline. May Shower, gently pat wound dry prior to applying new dressing. - wash with hibiclens Anesthetic (add to Medication List): Wound #1 Left Axilla: Topical Lidocaine 4% cream applied to wound bed prior to debridement (In Clinic Only). Skin Barriers/Peri-Wound Care: Wound #1 Left Axilla: Skin Prep Primary Wound  Dressing: Wound #1 Left Axilla: Silver Alginate - rope Secondary Dressing: ABD pad Other - carboflex Dressing Change Frequency: Wound #1 Left Axilla: Change dressing every day. Follow-up Appointments: Wound #1 Left Axilla: Return Appointment in 2 weeks. Additional Orders / Instructions: Wound #1 Left Axilla: Increase protein intake. The following medication(s) was prescribed: Augmentin oral 875 mg-125 mg tablet 1 1 tablet oral taken 2 times a day for 14 days starting 06/28/2018 Patient does seem to be making some progress at this time and I'm gonna suggest that we continue with the above wound care orders he's in agreement that plan. I did send in the antibiotic, Augmentin, for him to the pharmacy hopefully this is going to help take care of the infection as well I did do this for a 14 day course. Subsequently we will see him back for reevaluation in two weeks time to see were things stand. Please see above for specific wound care orders. We will see patient for re-evaluation in 1 week(s) here in the clinic. If anything worsens or changes patient will contact our office for additional recommendations. Electronic Signature(s) Signed: 06/29/2018 12:58:02 AM By: Worthy Keeler PA-C Entered By: Worthy Keeler on 06/28/2018 14:35:57 Louis Davies ([id number]) -------------------------------------------------------------------------------- ROS/PFSH Details Patient Name: Louis Davies Date of Service: 06/28/2018 2:00 PM Medical Record Number: [id number] Patient Account Number: [id number] Date of Birth/Sex: 1951/09/11 (66 y.o. M) Treating RN: Roger Shelter Primary Care Provider: Alvera Singh Other Clinician: Referring Provider: Alvera Singh Treating Provider/Extender: Melburn Hake, Caliegh Middlekauff Weeks in Treatment: 1 Information Obtained From Patient Wound History Do you currently have one or more open woundso Yes How many open wounds do you currently haveo 1 Approximately how  long have you had your woundso 2 months How have you been treating your wound(s) until nowo bandage Has your wound(s) ever healed and then re-openedo No Have you had any lab work done in the past montho Yes Have you tested positive for an antibiotic resistant organism (MRSA, VRE)o No Have you tested positive for osteomyelitis (bone infection)o No Have you had any tests for circulation on your legso No Have you had other problems associated with your woundso Infection, Swelling Constitutional Symptoms (General Health) Complaints and Symptoms: Negative for:  Fever; Chills Medical History: Past Medical History Notes: Radiation and Chemo for Metastatic Melanoma Eyes Medical History: Negative for: Cataracts; Glaucoma; Optic Neuritis Ear/Nose/Mouth/Throat Medical History: Negative for: Chronic sinus problems/congestion; Middle ear problems Hematologic/Lymphatic Medical History: Negative for: Anemia; Hemophilia; Human Immunodeficiency Virus; Lymphedema; Sickle Cell Disease Respiratory Complaints and Symptoms: No Complaints or Symptoms Medical History: Negative for: Aspiration; Asthma; Chronic Obstructive Pulmonary Disease (COPD); Pneumothorax; Sleep Apnea; Tuberculosis Cardiovascular DOYE, MONTILLA ([id number]) Complaints and Symptoms: No Complaints or Symptoms Medical History: Negative for: Angina; Arrhythmia; Congestive Heart Failure; Coronary Artery Disease; Deep Vein Thrombosis; Hypertension; Hypotension; Myocardial Infarction; Peripheral Arterial Disease; Peripheral Venous Disease; Phlebitis; Vasculitis Gastrointestinal Medical History: Negative for: Cirrhosis ; Colitis; Crohnos; Hepatitis A; Hepatitis B; Hepatitis C Endocrine Medical History: Negative for: Type I Diabetes; Type II Diabetes Genitourinary Medical History: Negative for: End Stage Renal Disease Immunological Medical History: Negative for: Lupus Erythematosus; Raynaudos; Scleroderma Integumentary  (Skin) Medical History: Negative for: History of Burn; History of pressure wounds Musculoskeletal Medical History: Positive for: Osteoarthritis - left knee Negative for: Gout; Rheumatoid Arthritis; Osteomyelitis Neurologic Medical History: Negative for: Dementia; Neuropathy; Quadriplegia; Paraplegia; Seizure Disorder Oncologic Medical History: Positive for: Received Chemotherapy; Received Radiation - Axillary Psychiatric Complaints and Symptoms: No Complaints or Symptoms Medical History: Negative for: Anorexia/bulimia; Confinement Anxiety Immunizations Pneumococcal VaccineEBERT, FORRESTER ([id number]) Received Pneumococcal Vaccination: Yes Implantable Devices Family and Social History Cancer: Yes - Father; Diabetes: Yes - Mother; Heart Disease: No; Hypertension: Yes - Mother; Kidney Disease: No; Lung Disease: No; Stroke: No; Thyroid Problems: No; Tuberculosis: No; Never smoker; Marital Status - Divorced; Alcohol Use: Never; Drug Use: No History; Caffeine Use: Rarely; Living Will: Yes (Copy provided) Physician Affirmation I have reviewed and agree with the above information. Electronic Signature(s) Signed: 06/28/2018 4:39:16 PM By: Roger Shelter Signed: 06/29/2018 12:58:02 AM By: Worthy Keeler PA-C Entered By: Worthy Keeler on 06/28/2018 14:34:17 Louis Davies ([id number]) -------------------------------------------------------------------------------- SuperBill Details Patient Name: Louis Davies Date of Service: 06/28/2018 Medical Record Number: [id number] Patient Account Number: [id number] Date of Birth/Sex: 04/17/1951 (66 y.o. 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

## 2018-06-30 NOTE — Therapy (Signed)
[place] MAIN [facility] SERVICES [address] [place], [place], [address] Phone: [phone]   Fax:  [phone]  Occupational Therapy Evaluation  Patient Details  Name: Louis Davies MRN: [id number] Date of Birth: 1951-03-21 Referring Provider: Lloyd Huger, MD   Encounter Date: 06/29/2018  OT End of Session - 06/29/18 1704    Visit Number  1    Number of Visits  36    Date for OT Re-Evaluation  09/27/18    OT Start Time  0200    OT Stop Time  0330    OT Time Calculation (min)  90 min    Activity Tolerance  Patient tolerated treatment well;Patient limited by fatigue;No increased pain    Behavior During Therapy  WFL for tasks assessed/performed       Past Medical History:  Diagnosis Date  . Anemia   . Arthritis   . Cancer ([place])   . Complication of anesthesia   . PONV (postoperative nausea and vomiting)     Past Surgical History:  Procedure Laterality Date  . AXILLARY LYMPH NODE DISSECTION Left 06/26/2017   Procedure: EXCISION OF LEFT AXILLARY MASS;  Surgeon: Johnathan Hausen, MD;  Location: [facility] ORS;  Service: General;  Laterality: Left;  . DIAGNOSTIC LAPAROSCOPY    . EMBOLIZATION Left 05/11/2018   Procedure: EMBOLIZATION;  Surgeon: Katha Cabal, MD;  Location: [facility];  Service: Cardiovascular;  Laterality: Left;  . LAPAROTOMY N/A 10/14/2017   Procedure: EXPLORATORY LAPAROTOMY;  Surgeon: Clayburn Pert, MD;  Location: [facility] ORS;  Service: General;  Laterality: N/A;  . VASECTOMY      There were no vitals filed for this visit.  Subjective Assessment - 06/29/18 1630    Subjective   Louis Davies is referred to Occupational Therapy for evaluation and treatment of LUE/LUQ cancer-related lymphedema by Lloyd Huger, MD.  Mr Relph reports initial onset of ongoing LUE and LUQ swelling after surgery to axilla with exacerbation after commencing radiation treatment    Currently in Pain?  Yes    Pain Score  3      Pain Location  Axilla    Pain Orientation  Left    Pain Descriptors / Indicators  Discomfort;Pressure;Nagging;Numbness;Guarding;Shooting;Tightness;Other (Comment)    Pain Type  Chronic pain    Pain Radiating Towards  axilla to forearm    Pain Frequency  Intermittent    Aggravating Factors   reaching out to the side , reaching overhead, lifting, carrying, walking with arm down, grasping    Pain Relieving Factors  elevation    Effect of Pain on Daily Activities  limits self care and basic and instrumental ADLs performance; limits work and productive activities;  limits leisure pursuits and social participation; impairs body image          LYMPHEDEMA/ONCOLOGY QUESTIONNAIRE - 06/29/18 1655      Type   Cancer Type  L axillary melanoma      Surgeries   Axillary Lymph Node Dissection Date  06/26/17    Number Lymph Nodes Removed  --   unknown     Date Lymphedema/Swelling Started   Date  06/26/17      Treatment   Active Chemotherapy Treatment  Yes    Active Radiation Treatment  No    Past Radiation Treatment  Yes    Body Site  L axilla      What other symptoms do you have   Are you Having Heaviness or Tightness  Yes  Are you having Pain  Yes    Are you having pitting edema  Yes    Body Site  LUE/LUQ    Is it Hard or Difficult finding clothes that fit  Yes    Do you have infections  Yes    Comments  open axillary wound. Unable to assess as dressings in place. Foul oder noted. Pt attends   [facility] 1 x weekly last 3 weeks. Next  visit in 2 weeks.    Stemmer Sign  Yes      Lymphedema Assessments   Lymphedema Assessments  Upper extremities      Right Upper Extremity Lymphedema   Other  Limb volumes TBA at initial Rx visit. By visiual assessment L> R (dominant) bty more than 20%        Severe Stage I - Mild Stage II, LUE/LUQ , cancer -related Lympghedema (LE)  2/2 axillary excisional biopsy/ ALND and XRT Skin Description Hyper-Keratosis Peau' de Orange Shiny  Tight Fibrotic Fatty Doughy Indurated      x x         Hydration Dry Flaky Erythema Other   x      Color Redness Present Pallor Blanching Hemosiderin Staining Other          Odor Malodorous Yeast  Absent   X axillary wound      Temperature Warm Cool wnl   x     Pitting Edema   1+ 2+ 3+ 4+ Non-pitting       X     Girth Symmetrical Asymmetrical Other Distribution    L>R > congestion distal to elbow   Stemmer Sign Positive Negative     STRONG +     Lymphorrea History Of:  Present Absent     x    Wounds History Of Present Absent Venous Arterial Pressure Size     X L axilla           Signs of Infection Redness Warmth Erythema Acute Swelling Drainage Borders                   Scars Adhesions Hypersensitivity        Sensation Light Touch Deep pressure Hypersensitivty   Present Impaired Present Impaired Absent Impaired     x      x  Nails WNL Fungus Present Other   x     Hair Growth Symmetrical Asymmetrical   unremarkable    Skin Creases Base of toes Ankle Base of Finger Medial Thigh           x                   OT Education - 06/29/18 1704    Education Details  rovided Pt and family skilled education regarding lymphatic structure and function, lymphedema etiologies, onset patterns and stages of progression. Discussed  impact of obesity on lymphatic system function. Outlined Complete Decongestive Therapy (CDT)  as standard of LE care and provided in depth information regarding 4 primary components of both Intensive and Self Management Phases, including Manual Lymph Drainage (MLD), compression wrapping and garments, skin care, and therapeutic exercise.   Discussed   Importance of daily, ongoing LE self-care essential to retaining clinical gains and limiting progression.  Lastly, reviewed lymphedema precautions, including cellulitis risk and difficulty with wound healing. Provided printed Lymphedema Workbook for reference.    Person(s) Educated   Patient    Methods  Explanation;Demonstration;Handout    Comprehension  Verbalized  understanding;Need further instruction                 Plan - 06/30/18 1311    Clinical Impression Statement  MASTER TOUCHET is a 67  year-old male presenting with severe stage I- mild stage II, LUE/LUQ lymphedema (LE secondary to ALND with excisional biopsy of L axillary mass and radiation for treatment of metastatic melanoma.  Lymphedema, both primary and secondary, is a chronic, progressive, incurable condition characterized by swelling of the soft tissues of the affected limb and/ or body quadrant in which an excessive amount of protein-rich fluid and tissue fibrosis has accumulated. Swelling and tissue congestion is observed in the LUE extends from axilla to fingers.  L lateral trunk swelling extends below ribs. L breast is mildly swollen, and posterior swelling extends from scapula to last rib. L forearm and dorsal hand swelling presents with dense 4+ pitting. LUE/LUQ swelling and associated pain / discomfort limits Mr. Stanfill's ability to perform self -care and most basic ADLs, including bathing, dressing and grooming.  LE limits instrumental ADLs performance, work and productive activities, leisure pursuits and social participation. LUE/LUQ pain and swelling limit bed mobility, transfers and ability to drive.  LE contributes to impaired body image and decreased role performance within his family and at work.  Mr. Douthat will benefit from skilled Occupational Therapy for Intensive and Management Phase Complete Decongestive Therapy (CDT), to include manual lymphatic drainage, skin care, therapeutic exercise and compression therapy to reduce LUE/ LUQ swelling, to return limb to typical size and shape, to reduce infection risk and to regain functional arm and hand use. Emphasis throughout OT course will focus on Pt education for long term LE self-care. Without skilled OT for CDT LE will continue to progress and  further functional decline is expected.    Occupational performance deficits (Please refer to evaluation for details):  ADL's;Leisure;Social Participation;IADL's;Rest and Sleep;Work;Other   body image   Rehab Potential  Good    OT Frequency  3x / week    OT Duration  Other (comment)   and PRN   OT Treatment/Interventions  Self-care/ADL training;Therapeutic exercise;Manual lymph drainage;Compression bandaging;Patient/family education;Other (comment);Energy conservation;Therapeutic activities;Balance training;DME and/or AE instruction;Manual Therapy;Passive range of motion    Clinical Decision Making  Multiple treatment options, significant modification of task necessary    Recommended Other Services  fit with custom, clat knit LUE compression sleeve and HOS device. Consider seamless compression glove and vest. Consider kinesiotape during Intensive Phase    Consulted and Agree with Plan of Care  Patient      OT Long Term Goals - 06/30/18 1323      OT LONG TERM GOAL #1   Title  Pt will be able to apply multi-layer short stretch compression wraps using correct gradient techniques with independently , but with extra time  to achieve optimal limb volume reduction during Intensive Phase CDT, and to return affected limb(s) , as closely as possible, to premorbid size and shape.    Baseline  Dependent    Time  2    Period  Weeks    Status  New    Target Date  --   10th OT Rx visit     OT LONG TERM GOAL #2   Title  Pt will demonstrate understanding of lymphedema (LE) precautions / prevention principals, including signs / symptoms of cellulitis infection with modified independence using LE Workbook as printed reference to identify 6 precautions without verbal cues by end of 3rd  OT Rx  visit.     Baseline  no knowledge    Time  3    Period  Days    Status  New    Target Date  --   3rd OT Rx visit     OT LONG TERM GOAL #3   Title  Pt to achieve no less than 10% limb volume reduction in affected  limb(s) during Intensive Phase CDT to control limb swelling, to improve tissue integrity and immune function, to improve ADLs performance and to improve functional mobility/ transfers.    Baseline  dependent    Time  12    Period  Weeks    Status  New    Target Date  09/27/18      OT LONG TERM GOAL #4   Title  Pt will achieve 100% compliance with daily LE self-care home program components, including proper skin care, simple self-MLD, gradient compression wraps/ garments, and therapeutic exercise to ensure optimal Intensive Phase limb volume reduction to expedite compression garment/ device fitting.    Baseline  Max A    Time  12    Period  Weeks    Status  New    Target Date  09/27/18      OT LONG TERM GOAL #5   Title  Pt will demonstrate competent use of assistive devices during LE self-care training to improve ability to don and doff compression garments/devices with modified assistance to ensure optimal LE self-management over time.    Baseline  Max A    Time  12    Status  New    Target Date  09/27/18      Long Term Additional Goals   Additional Long Term Goals  Yes      OT LONG TERM GOAL #6   Title  Pt will retain optimal limb volume reductions achieved during Intensive Phase CDT with no more than 3% volume increase  without CG assistance to limit LE progression and further functional decline.    Baseline  Dependent    Time  6    Period  Months    Status  New    Target Date  12/27/18         Patient will benefit from skilled therapeutic intervention in order to improve the following deficits and impairments:  Decreased endurance, Decreased mobility, Decreased skin integrity, Impaired sensation, Decreased knowledge of precautions, Decreased range of motion, Decreased scar mobility, Increased edema, Pain, Decreased activity tolerance, Decreased knowledge of use of DME, Decreased strength, Impaired flexibility, Impaired UE functional use, Decreased psychosocial skills,  Decreased balance, Other (comment)(balance and gait negatively impacted by guarding  posture , impaired armswing and body asymmetry)  Visit Diagnosis: Postmastectomy lymphedema syndrome    Problem List Patient Active Problem List   Diagnosis Date Noted  . Anemia 05/10/2018  . Perforated viscus   . Ileus ([place])   . Constipation 10/10/2017  . Metastatic melanoma ([place]) 07/07/2017  . Axillary mass, left 06/26/2017  . Chronic pain of left knee 12/14/2015  . Influenza vaccination declined 11/27/2015    Ansel Bong 06/30/2018, 1:22 PM  [place] MAIN [facility] SERVICES [address] [place], [place], [address] Phone: [phone]   Fax:  [phone]  Name: JOSEP LUVIANO MRN: [id number] Date of Birth: 1951/03/23

## 2018-07-05 ENCOUNTER — Ambulatory Visit: Payer: Medicare Other | Admitting: Occupational Therapy

## 2018-07-05 DIAGNOSIS — I890 Lymphedema, not elsewhere classified: Secondary | ICD-10-CM | POA: Diagnosis not present

## 2018-07-05 DIAGNOSIS — E8989 Other postprocedural endocrine and metabolic complications and disorders: Secondary | ICD-10-CM | POA: Diagnosis not present

## 2018-07-05 DIAGNOSIS — I972 Postmastectomy lymphedema syndrome: Secondary | ICD-10-CM | POA: Diagnosis not present

## 2018-07-05 NOTE — Patient Instructions (Signed)
Lymphedema Precautions   Dopler study required prior to participating in Complete Decongestive Therapy (CDT) for lymphedema (LE) care  to rule out DVT   If you experience atypical shortness of breath, or notice any signs /symptoms of skin infection (aka cellulitis) remove all compression wraps/ garments, discontinue manual lymphatic drainage (MLD), and report symptoms to your physician immediately. Discontinue MLD and compression for 72 hours after you take your first oral antibiotic so not to spread the infection.   Lymphedema Self- Care Instructions  1. EXERCISE: Perform lymphatic pumping there ex 2 x a day. While wearing your compression wraps or garments. Perform 10 reps of each exercise bilaterally and be sure to perform them in order. Don't skip around!  OMIT PARTIAL SIT UP  2. EXERCISE: PerformAxillary Web Syndrome ("Cording") there ex/ stretches there ex 2 x a day. Perform 10 reps of each exercise with 15 second  "hold" when indicated.   3. MLD: Perform simple self-Manual Lymphatic Drainage (MLD) at least once a day following sequences iIN ORDER as directed!  4. COMPRESSION WRAPS: Compression wraps are to be worn 23 hrs/ 7 days/wk during Intensive Phase of Complete Decongestive Therapy (CDT).Building tolerance may take time and practice, so don't get discouraged. If bandages begin to feel tight during periods of inactivity and/or during the night, try performing your exercises to loosen them.   5. GARMENTS: During Management Phase CDT your compression garments are to be worn during waking hours when active. Do NOT sleep in your garments!!   6. SKIN: Carefully monitor skin condition and perform impeccable hygiene daily. Bathe skin with mild soap and water and apply low pH lotion (aka Eucerin ) to improve hydration and limit infection risk. . 7. PRECAUTIONS: Diligently follow all LE precautions and prevention principals. Report any signs/ symptoms of infection in the affected limb to  your doctor immediately.

## 2018-07-05 NOTE — Therapy (Signed)
[place] MAIN [facility] SERVICES [address] [place], [place], [address] Phone: [phone]   Fax:  [phone]  Occupational Therapy Treatment  Patient Details  Name: Louis Davies MRN: [id number] Date of Birth: 14-Oct-1951 Referring Provider: Lloyd Huger, MD   Encounter Date: 07/05/2018  OT End of Session - 07/05/18 1207    Visit Number  1    Number of Visits  36    Date for OT Re-Evaluation  09/27/18    OT Start Time  0205    OT Stop Time  0320    OT Time Calculation (min)  75 min    Activity Tolerance  Patient tolerated treatment well;Patient limited by fatigue;No increased pain    Behavior During Therapy  WFL for tasks assessed/performed       Past Medical History:  Diagnosis Date  . Anemia   . Arthritis   . Cancer ([place])   . Complication of anesthesia   . PONV (postoperative nausea and vomiting)     Past Surgical History:  Procedure Laterality Date  . AXILLARY LYMPH NODE DISSECTION Left 06/26/2017   Procedure: EXCISION OF LEFT AXILLARY MASS;  Surgeon: Johnathan Hausen, MD;  Location: [facility] ORS;  Service: General;  Laterality: Left;  . DIAGNOSTIC LAPAROSCOPY    . EMBOLIZATION Left 05/11/2018   Procedure: EMBOLIZATION;  Surgeon: Katha Cabal, MD;  Location: [facility];  Service: Cardiovascular;  Laterality: Left;  . LAPAROTOMY N/A 10/14/2017   Procedure: EXPLORATORY LAPAROTOMY;  Surgeon: Clayburn Pert, MD;  Location: [facility] ORS;  Service: General;  Laterality: N/A;  . VASECTOMY      There were no vitals filed for this visit.  Subjective Assessment - 07/05/18 1142    Subjective   Louis Davies presents for OT visit 2/36 fpr Complete Decongestive Therapy (CDT) to cancer -related LUE/LUq lymphedema (LE) . Pt is unaccompanied today. OTS, , is present for entire session. Pt has no new complaintes since initial evaluation comnpleted on 06/29/2018.    Currently in Pain?  Yes    Pain Location  Arm    Pain  Orientation  Left    Pain Type  Chronic pain    Pain Frequency  Intermittent          LYMPHEDEMA/ONCOLOGY QUESTIONNAIRE - 07/05/18 1153      Lymphedema Assessments   Lymphedema Assessments  Upper extremities      Right Upper Extremity Lymphedema   Other  RUE (dominant) limb volume measures   2154.81 ml.      Left Upper Extremity Lymphedema   Other  LUE limb volume measures 2643.17 ml.    Other  Initial limb volume differential (LVD) measures 22.7 % LUE (Rx) > RUE ( dominant)              OT Treatments/Exercises (OP) - 07/05/18 0001      ADLs   ADL Education Given  Yes      Exercises   Exercises  --   LUE lymphatic pumping ther ex; LUE / LUQ axillary web syndro     Manual Therapy   Manual Therapy  Edema management;Compression Bandaging    Compression Bandaging  Multilayer gradient compression wraps to LUE using short stretch wraps over Rosidol foal. Initial layer of cotton stockinett applied to L arm and hand . Lymphedema hand bandaging in standard configuration using elastic guaze to digits 1-5. Cotton stockinett applied from base of fingers ( and folded back at MPs) extending to proximal arm  at border of axilla. Foal over stockinett w/ lite tension and ~ 50% overalp ; 6 cm shrort atretch wrap to hand starting at MPs and using finger of 8over hand x 2, then extending over wrist w/ light tension and 50% overlap. Next, 8 cm wrap applied at wrist and extending proximally w/ 50% overlap. Finally. 10 cm wrap anchored at wrist again and extending proximally w/ 50% overlap. Stretch net applied over entire wrap system to assist w/ keeping all in place.             OT Education - 07/05/18 1205    Education Details  Continued skilled Pt/caregiver education  And LE ADL training throughout visit for lymphedema self care/ home program, including compression wrapping, compression garment and device wear/care, lymphatic pumping ther ex, simple self-MLD, and skin care. Discussed  progress towards goals.    Person(s) Educated  Patient    Methods  Explanation;Demonstration;Handout;Tactile cues;Verbal cues    Comprehension  Verbalized understanding;Need further instruction;Returned demonstration;Verbal cues required;Tactile cues required          OT Long Term Goals - 06/30/18 1323      OT LONG TERM GOAL #1   Title  Pt will be able to apply multi-layer short stretch compression wraps using correct gradient techniques with independently , but with extra time  to achieve optimal limb volume reduction during Intensive Phase CDT, and to return affected limb(s) , as closely as possible, to premorbid size and shape.    Baseline  Dependent    Time  2    Period  Weeks    Status  New    Target Date  --   10th OT Rx visit     OT LONG TERM GOAL #2   Title  Pt will demonstrate understanding of lymphedema (LE) precautions / prevention principals, including signs / symptoms of cellulitis infection with modified independence using LE Workbook as printed reference to identify 6 precautions without verbal cues by end of 3rd  OT Rx visit.     Baseline  no knowledge    Time  3    Period  Days    Status  New    Target Date  --   3rd OT Rx visit     OT LONG TERM GOAL #3   Title  Pt to achieve no less than 10% limb volume reduction in affected limb(s) during Intensive Phase CDT to control limb swelling, to improve tissue integrity and immune function, to improve ADLs performance and to improve functional mobility/ transfers.    Baseline  dependent    Time  12    Period  Weeks    Status  New    Target Date  09/27/18      OT LONG TERM GOAL #4   Title  Pt will achieve 100% compliance with daily LE self-care home program components, including proper skin care, simple self-MLD, gradient compression wraps/ garments, and therapeutic exercise to ensure optimal Intensive Phase limb volume reduction to expedite compression garment/ device fitting.    Baseline  Max A    Time  12     Period  Weeks    Status  New    Target Date  09/27/18      OT LONG TERM GOAL #5   Title  Pt will demonstrate competent use of assistive devices during LE self-care training to improve ability to don and doff compression garments/devices with modified assistance to ensure optimal LE self-management over time.  Baseline  Max A    Time  12    Status  New    Target Date  09/27/18      Long Term Additional Goals   Additional Long Term Goals  Yes      OT LONG TERM GOAL #6   Title  Pt will retain optimal limb volume reductions achieved during Intensive Phase CDT with no more than 3% volume increase  without CG assistance to limit LE progression and further functional decline.    Baseline  Dependent    Time  6    Period  Months    Status  New    Target Date  12/27/18            Plan - 07/05/18 1522    Clinical Impression Statement  Completed initial BUE comparative luimb volumetric measurements to establish baseline limb volumes. RUE (dominant) limb volume measures   2154.81 ml. LUE limb volume measures 2643.17 ml. Initial limb volume differential (LVD) measures 22.7 % LUE (Rx) > RUE ( dominant). Commenced  compression wrapping LUE extending from fingers to proximal L arm. Pt verbalized understanding of intructions for overnight visist interval. Provided Pt edu for self wrapping rational and method. Pt needs additional instruction and opportunities to practice with supervision. OTS present throughout session.     Occupational performance deficits (Please refer to evaluation for details):  ADL's;Leisure;Social Participation;IADL's;Rest and Sleep;Work;Other   body image   Rehab Potential  Good    OT Frequency  3x / week    OT Duration  Other (comment)   and PRN   OT Treatment/Interventions  Self-care/ADL training;Therapeutic exercise;Manual lymph drainage;Compression bandaging;Patient/family education;Other (comment);Energy conservation;Therapeutic activities;Balance training;DME and/or  AE instruction;Manual Therapy;Passive range of motion    Clinical Decision Making  Multiple treatment options, significant modification of task necessary    Recommended Other Services  fit with custom, clat knit LUE compression sleeve and HOS device. Consider seamless compression glove and vest. Consider kinesiotape during Intensive Phase    Consulted and Agree with Plan of Care  Patient       Patient will benefit from skilled therapeutic intervention in order to improve the following deficits and impairments:  Decreased endurance, Decreased mobility, Decreased skin integrity, Impaired sensation, Decreased knowledge of precautions, Decreased range of motion, Decreased scar mobility, Increased edema, Pain, Decreased activity tolerance, Decreased knowledge of use of DME, Decreased strength, Impaired flexibility, Impaired UE functional use, Decreased psychosocial skills, Decreased balance, Other (comment)(balance and gait negatively impacted by guarding  posture , impaired armswing and body asymmetry)  Visit Diagnosis: Postmastectomy lymphedema syndrome    Problem List Patient Active Problem List   Diagnosis Date Noted  . Anemia 05/10/2018  . Perforated viscus   . Ileus ([place])   . Constipation 10/10/2017  . Metastatic melanoma ([place]) 07/07/2017  . Axillary mass, left 06/26/2017  . Chronic pain of left knee 12/14/2015  . Influenza vaccination declined 11/27/2015    Andrey Spearman, MS, OTR/L, [facility] 07/05/18 3:32 PM  [place] MAIN [facility] SERVICES [address] [place], [place], [address] Phone: [phone]   Fax:  [phone]  Name: Louis Davies MRN: [id number] Date of Birth: 07-08-1951

## 2018-07-06 ENCOUNTER — Ambulatory Visit: Payer: Medicare Other | Admitting: Occupational Therapy

## 2018-07-06 DIAGNOSIS — I890 Lymphedema, not elsewhere classified: Principal | ICD-10-CM

## 2018-07-06 DIAGNOSIS — E8989 Other postprocedural endocrine and metabolic complications and disorders: Secondary | ICD-10-CM | POA: Diagnosis not present

## 2018-07-06 DIAGNOSIS — I972 Postmastectomy lymphedema syndrome: Secondary | ICD-10-CM | POA: Diagnosis not present

## 2018-07-06 NOTE — Therapy (Signed)
[place] MAIN [facility] SERVICES [address] [place], [place], [address] Phone: [phone]   Fax:  [phone]  Occupational Therapy Treatment  Patient Details  Name: Louis Davies MRN: [id number] Date of Birth: 1951/09/03 Referring Provider: Lloyd Huger, MD   Encounter Date: 07/06/2018  OT End of Session - 07/06/18 1533    Visit Number  3    Number of Visits  36    Date for OT Re-Evaluation  09/27/18    OT Start Time  0200    OT Stop Time  0320    OT Time Calculation (min)  80 min    Activity Tolerance  Patient tolerated treatment well;Patient limited by fatigue;No increased pain    Behavior During Therapy  WFL for tasks assessed/performed       Past Medical History:  Diagnosis Date  . Anemia   . Arthritis   . Cancer ([place])   . Complication of anesthesia   . PONV (postoperative nausea and vomiting)     Past Surgical History:  Procedure Laterality Date  . AXILLARY LYMPH NODE DISSECTION Left 06/26/2017   Procedure: EXCISION OF LEFT AXILLARY MASS;  Surgeon: Johnathan Hausen, MD;  Location: [facility] ORS;  Service: General;  Laterality: Left;  . DIAGNOSTIC LAPAROSCOPY    . EMBOLIZATION Left 05/11/2018   Procedure: EMBOLIZATION;  Surgeon: Katha Cabal, MD;  Location: [facility] [facility];  Service: Cardiovascular;  Laterality: Left;  . LAPAROTOMY N/A 10/14/2017   Procedure: EXPLORATORY LAPAROTOMY;  Surgeon: Clayburn Pert, MD;  Location: [facility] ORS;  Service: General;  Laterality: N/A;  . VASECTOMY      There were no vitals filed for this visit.  Subjective Assessment - 07/06/18 1423    Subjective   Louis Davies presents for OT visit 3/36 for Complete Decongestive Therapy (CDT) to cancer -related LUE/LUq lymphedema (LE) . Pt is unaccompanied today. OTS, , is present for entire session.  Pt presents with compression wraps applied in clinic yesterday in place. Pt states he was able to work all day despite finger wraps getting in  the way. He reports he was able to sleep without difficulty.     Currently in Pain?  Yes    Pain Score  --   not rated numerically.   Pain Location  Arm    Pain Descriptors / Indicators  Discomfort;Tiring;Tightness    Pain Type  Chronic pain                   OT Treatments/Exercises (OP) - 07/06/18 0001      ADLs   ADL Education Given  Yes      Exercises   Exercises  --   LUE/LUQ lymphatic pumping ther ex and flexibility ther ex     Manual Therapy   Manual Therapy  Edema management;Compression Bandaging    Compression Bandaging  Multilayer gradient compression wraps to LUE using short stretch wraps over Rosidol foal. Initial layer of cotton stockinett applied to L arm and hand . Lymphedema hand bandaging in standard configuration using elastic guaze to digits 1-5. Cotton stockinett applied from base of fingers ( and folded back at MPs) extending to proximal arm at border of axilla. Foal over stockinett w/ lite tension and ~ 50% overalp ; 6 cm shrort atretch wrap to hand starting at MPs and using finger of 8over hand x 2, then extending over wrist w/ light tension and 50% overlap. Next, 8 cm wrap applied at wrist and extending  proximally w/ 50% overlap. Finally. 10 cm wrap anchored at wrist again and extending proximally w/ 50% overlap. Stretch net applied over entire wrap system to assist w/ keeping all in place.             OT Education - 07/06/18 1531    Education Details  Emphasis of session on Pt edu for lymphatic pumping therex, flexibility ther ex to address L axillary cording  and tissue tightness 2/2 XRT    Person(s) Educated  Patient    Methods  Explanation;Demonstration;Handout;Tactile cues;Verbal cues    Comprehension  Verbalized understanding;Need further instruction;Returned demonstration;Verbal cues required;Tactile cues required          OT Long Term Goals - 06/30/18 1323      OT LONG TERM GOAL #1   Title  Pt will be able to apply multi-layer  short stretch compression wraps using correct gradient techniques with independently , but with extra time  to achieve optimal limb volume reduction during Intensive Phase CDT, and to return affected limb(s) , as closely as possible, to premorbid size and shape.    Baseline  Dependent    Time  2    Period  Weeks    Status  New    Target Date  --   10th OT Rx visit     OT LONG TERM GOAL #2   Title  Pt will demonstrate understanding of lymphedema (LE) precautions / prevention principals, including signs / symptoms of cellulitis infection with modified independence using LE Workbook as printed reference to identify 6 precautions without verbal cues by end of 3rd  OT Rx visit.     Baseline  no knowledge    Time  3    Period  Days    Status  New    Target Date  --   3rd OT Rx visit     OT LONG TERM GOAL #3   Title  Pt to achieve no less than 10% limb volume reduction in affected limb(s) during Intensive Phase CDT to control limb swelling, to improve tissue integrity and immune function, to improve ADLs performance and to improve functional mobility/ transfers.    Baseline  dependent    Time  12    Period  Weeks    Status  New    Target Date  09/27/18      OT LONG TERM GOAL #4   Title  Pt will achieve 100% compliance with daily LE self-care home program components, including proper skin care, simple self-MLD, gradient compression wraps/ garments, and therapeutic exercise to ensure optimal Intensive Phase limb volume reduction to expedite compression garment/ device fitting.    Baseline  Max A    Time  12    Period  Weeks    Status  New    Target Date  09/27/18      OT LONG TERM GOAL #5   Title  Pt will demonstrate competent use of assistive devices during LE self-care training to improve ability to don and doff compression garments/devices with modified assistance to ensure optimal LE self-management over time.    Baseline  Max A    Time  12    Status  New    Target Date  09/27/18       Long Term Additional Goals   Additional Long Term Goals  Yes      OT LONG TERM GOAL #6   Title  Pt will retain optimal limb volume reductions achieved during Intensive Phase CDT  with no more than 3% volume increase  without CG assistance to limit LE progression and further functional decline.    Baseline  Dependent    Time  6    Period  Months    Status  New    Target Date  12/27/18            Plan - 07/06/18 1533    Clinical Impression Statement   Pt demonstrates excellent responce to compression therapy over night. By visual assessment limb volume is decreased throughout LUE by 10% at least. Pt demonstrated good tolerance  of wraps over last 24 hours. Emphasis of session today on Pt edu for compression wrapping and ther ex for lymphatic pumping and flexibility ( to address aaxillary web syndrome. By end of session Pt able to perform all therex and demonstrate understanding of regime- 2 sets of 10 each, in order, 1 x daily foreach protocol. By end of aseesion Pt able to apply multilayer gradient compression wraps with very good technique w/ Max A. Pt will practice during visit interval. W'll commence MLD at next visit and continue teaching gradient compression wrapping. Cont as per POC.    Occupational performance deficits (Please refer to evaluation for details):  ADL's;Leisure;Social Participation;IADL's;Rest and Sleep;Work;Other   body image   Rehab Potential  Good    OT Frequency  3x / week    OT Duration  Other (comment)   and PRN   OT Treatment/Interventions  Self-care/ADL training;Therapeutic exercise;Manual lymph drainage;Compression bandaging;Patient/family education;Other (comment);Energy conservation;Therapeutic activities;Balance training;DME and/or AE instruction;Manual Therapy;Passive range of motion    Clinical Decision Making  Multiple treatment options, significant modification of task necessary    Recommended Other Services  fit with custom, clat knit LUE compression  sleeve and HOS device. Consider seamless compression glove and vest. Consider kinesiotape during Intensive Phase    Consulted and Agree with Plan of Care  Patient       Patient will benefit from skilled therapeutic intervention in order to improve the following deficits and impairments:  Decreased endurance, Decreased mobility, Decreased skin integrity, Impaired sensation, Decreased knowledge of precautions, Decreased range of motion, Decreased scar mobility, Increased edema, Pain, Decreased activity tolerance, Decreased knowledge of use of DME, Decreased strength, Impaired flexibility, Impaired UE functional use, Decreased psychosocial skills, Decreased balance, Other (comment)(balance and gait negatively impacted by guarding  posture , impaired armswing and body asymmetry)  Visit Diagnosis: Lymphedema of upper extremity following lymphadenectomy    Problem List Patient Active Problem List   Diagnosis Date Noted  . Anemia 05/10/2018  . Perforated viscus   . Ileus ([place])   . Constipation 10/10/2017  . Metastatic melanoma ([place]) 07/07/2017  . Axillary mass, left 06/26/2017  . Chronic pain of left knee 12/14/2015  . Influenza vaccination declined 11/27/2015    Andrey Spearman, MS, OTR/L, [facility] 07/06/18 3:42 PM  [place] MAIN [facility] SERVICES [address] [place], [place], [address] Phone: [phone]   Fax:  [phone]  Name: Louis Davies MRN: [id number] Date of Birth: 02/03/1951

## 2018-07-08 ENCOUNTER — Ambulatory Visit: Payer: Medicare Other | Admitting: Occupational Therapy

## 2018-07-08 DIAGNOSIS — E8989 Other postprocedural endocrine and metabolic complications and disorders: Secondary | ICD-10-CM | POA: Diagnosis not present

## 2018-07-08 DIAGNOSIS — I890 Lymphedema, not elsewhere classified: Secondary | ICD-10-CM | POA: Diagnosis not present

## 2018-07-08 DIAGNOSIS — I972 Postmastectomy lymphedema syndrome: Secondary | ICD-10-CM | POA: Diagnosis not present

## 2018-07-08 NOTE — Therapy (Signed)
[place] MAIN [facility] SERVICES [address] [place], [place], [address] Phone: [phone]   Fax:  [phone]  Occupational Therapy Treatment  Patient Details  Name: Louis Davies MRN: [id number] Date of Birth: [birth year] Referring Provider: Lloyd Huger, MD   Encounter Date: 07/08/2018  OT End of Session - 07/08/18 1557    Visit Number  4    Number of Visits  36    Date for OT Re-Evaluation  09/27/18    OT Start Time  0200    OT Stop Time  0325    OT Time Calculation (min)  85 min    Activity Tolerance  Patient tolerated treatment well;Patient limited by fatigue;No increased pain    Behavior During Therapy  WFL for tasks assessed/performed       Past Medical History:  Diagnosis Date  . Anemia   . Arthritis   . Cancer ([place])   . Complication of anesthesia   . PONV (postoperative nausea and vomiting)     Past Surgical History:  Procedure Laterality Date  . AXILLARY LYMPH NODE DISSECTION Left 06/26/2017   Procedure: EXCISION OF LEFT AXILLARY MASS;  Surgeon: Johnathan Hausen, MD;  Location: [facility] ORS;  Service: General;  Laterality: Left;  . DIAGNOSTIC LAPAROSCOPY    . EMBOLIZATION Left 05/11/2018   Procedure: EMBOLIZATION;  Surgeon: Katha Cabal, MD;  Location: [place] [facility];  Service: Cardiovascular;  Laterality: Left;  . LAPAROTOMY N/A 10/14/2017   Procedure: EXPLORATORY LAPAROTOMY;  Surgeon: Clayburn Pert, MD;  Location: [facility] ORS;  Service: General;  Laterality: N/A;  . VASECTOMY      There were no vitals filed for this visit.  Subjective Assessment - 07/08/18 1555    Subjective   Pt presents for OT visit 4/36 for Complete Decongestive Therapy (CDT) for melanoma-related  LUE/LUq lymphedema (LE) . Pt Pt presents with compression wraps in place. "I liked that glove a lot better, but it's kind of a mess after working in it all day. " OT provided printed resource for [employer] OTS compression gloves.                    OT Treatments/Exercises (OP) - 07/08/18 0001      ADLs   ADL Education Given  Yes      Manual Therapy   Manual Therapy  Edema management;Soft tissue mobilization;Myofascial release;Manual Lymphatic Drainage (MLD);Compression Bandaging    Myofascial Release  vey gentle myofacial release to distal LUE in effort to release cording in axilla.     Manual Lymphatic Drainage (MLD)  MLD concentrated at proximal pathways today in effort to improve lymphangiomotoricity and stimulate formation of colateral lymphatic circulation.MLD to LUQ/ LUE using alternative pathways, including : short neck sequence, MLD to contralateral axilla using anterior and posterior anastamoses, and using ipsilateral axillary-inguinal pathway in sidelying..     Compression Bandaging  Multilayer gradient compression wraps to LUE using short stretch wraps over Rosidol foal. Initial layer of cotton stockinett applied to L arm and hand . Lymphedema hand bandaging in standard configuration using elastic guaze to digits 1-5. Cotton stockinett applied from base of fingers ( and folded back at MPs) extending to proximal arm at border of axilla. Foal over stockinett w/ lite tension and ~ 50% overalp ; 6 cm shrort atretch wrap to hand starting at MPs and using finger of 8over hand x 2, then extending over wrist w/ light tension and 50% overlap. Next, 8 cm wrap applied at  wrist and extending proximally w/ 50% overlap. Finally. 10 cm wrap anchored at wrist again and extending proximally w/ 50% overlap. Stretch net applied over entire wrap system to assist w/ keeping all in place.             OT Education - 07/08/18 1557    Education Details  PT edu re lymphatic anatomy pertaining to MLD sequences/    Person(s) Educated  Patient    Methods  Explanation;Demonstration;Handout;Tactile cues;Verbal cues    Comprehension  Verbalized understanding;Need further instruction;Returned demonstration;Verbal cues  required;Tactile cues required          OT Long Term Goals - 06/30/18 1323      OT LONG TERM GOAL #1   Title  Pt will be able to apply multi-layer short stretch compression wraps using correct gradient techniques with independently , but with extra time  to achieve optimal limb volume reduction during Intensive Phase CDT, and to return affected limb(s) , as closely as possible, to premorbid size and shape.    Baseline  Dependent    Time  2    Period  Weeks    Status  New    Target Date  --   10th OT Rx visit     OT LONG TERM GOAL #2   Title  Pt will demonstrate understanding of lymphedema (LE) precautions / prevention principals, including signs / symptoms of cellulitis infection with modified independence using LE Workbook as printed reference to identify 6 precautions without verbal cues by end of 3rd  OT Rx visit.     Baseline  no knowledge    Time  3    Period  Days    Status  New    Target Date  --   3rd OT Rx visit     OT LONG TERM GOAL #3   Title  Pt to achieve no less than 10% limb volume reduction in affected limb(s) during Intensive Phase CDT to control limb swelling, to improve tissue integrity and immune function, to improve ADLs performance and to improve functional mobility/ transfers.    Baseline  dependent    Time  12    Period  Weeks    Status  New    Target Date  09/27/18      OT LONG TERM GOAL #4   Title  Pt will achieve 100% compliance with daily LE self-care home program components, including proper skin care, simple self-MLD, gradient compression wraps/ garments, and therapeutic exercise to ensure optimal Intensive Phase limb volume reduction to expedite compression garment/ device fitting.    Baseline  Max A    Time  12    Period  Weeks    Status  New    Target Date  09/27/18      OT LONG TERM GOAL #5   Title  Pt will demonstrate competent use of assistive devices during LE self-care training to improve ability to don and doff compression  garments/devices with modified assistance to ensure optimal LE self-management over time.    Baseline  Max A    Time  12    Status  New    Target Date  09/27/18      Long Term Additional Goals   Additional Long Term Goals  Yes      OT LONG TERM GOAL #6   Title  Pt will retain optimal limb volume reductions achieved during Intensive Phase CDT with no more than 3% volume increase  without CG assistance to  limit LE progression and further functional decline.    Baseline  Dependent    Time  6    Period  Months    Status  New    Target Date  12/27/18            Plan - 07/08/18 1602    Clinical Impression Statement  Pt did a very nice job with self -wrapping during visit interval. Prtovided Pt edu, including demonstration, to improve techniques at end of session. Issued additional gloves from [employer] and provided printed resource for additional gloves. Commenced MLD to LUE and LUQ in supine and sidelying redirecting fluid to adjacent axillary and inguinal anastomoses. Provided very gentle myofacial release to distal arm in effort to release painful axillary cording . Pt tolerated manual therapies without difficulty. Reapplied compression wraps to LUE as established. LUE volume reduction shows marked improvement and excellent response to treatment thus far. Cont as per POC.     Occupational performance deficits (Please refer to evaluation for details):  ADL's;Leisure;Social Participation;IADL's;Rest and Sleep;Work;Other   body image   Rehab Potential  Good    OT Frequency  3x / week    OT Duration  Other (comment)   and PRN   OT Treatment/Interventions  Self-care/ADL training;Therapeutic exercise;Manual lymph drainage;Compression bandaging;Patient/family education;Other (comment);Energy conservation;Therapeutic activities;Balance training;DME and/or AE instruction;Manual Therapy;Passive range of motion    Clinical Decision Making  Multiple treatment options, significant modification of  task necessary    Recommended Other Services  fit with custom, clat knit LUE compression sleeve and HOS device. Consider seamless compression glove and vest. Consider kinesiotape during Intensive Phase    Consulted and Agree with Plan of Care  Patient       Patient will benefit from skilled therapeutic intervention in order to improve the following deficits and impairments:  Decreased endurance, Decreased mobility, Decreased skin integrity, Impaired sensation, Decreased knowledge of precautions, Decreased range of motion, Decreased scar mobility, Increased edema, Pain, Decreased activity tolerance, Decreased knowledge of use of DME, Decreased strength, Impaired flexibility, Impaired UE functional use, Decreased psychosocial skills, Decreased balance, Other (comment)(balance and gait negatively impacted by guarding  posture , impaired armswing and body asymmetry)  Visit Diagnosis: Lymphedema of upper extremity following lymphadenectomy    Problem List Patient Active Problem List   Diagnosis Date Noted  . Anemia 05/10/2018  . Perforated viscus   . Ileus ([place])   . Constipation 10/10/2017  . Metastatic melanoma ([place]) 07/07/2017  . Axillary mass, left 06/26/2017  . Chronic pain of left knee 12/14/2015  . Influenza vaccination declined 11/27/2015   Andrey Spearman, MS, OTR/L, [facility] 07/08/18 4:09 PM  [place] MAIN [facility] SERVICES [address] [place], [place], [address] Phone: [phone]   Fax:  [phone]  Name: Louis Davies MRN: [id number] Date of Birth: [birth year]

## 2018-07-12 ENCOUNTER — Encounter: Payer: Medicare Other | Admitting: Physician Assistant

## 2018-07-12 ENCOUNTER — Ambulatory Visit: Payer: Medicare Other | Admitting: Radiation Oncology

## 2018-07-12 DIAGNOSIS — L98495 Non-pressure chronic ulcer of skin of other sites with muscle involvement without evidence of necrosis: Secondary | ICD-10-CM | POA: Diagnosis not present

## 2018-07-12 DIAGNOSIS — Z9221 Personal history of antineoplastic chemotherapy: Secondary | ICD-10-CM | POA: Diagnosis not present

## 2018-07-12 DIAGNOSIS — Z809 Family history of malignant neoplasm, unspecified: Secondary | ICD-10-CM | POA: Diagnosis not present

## 2018-07-12 DIAGNOSIS — Z833 Family history of diabetes mellitus: Secondary | ICD-10-CM | POA: Diagnosis not present

## 2018-07-12 DIAGNOSIS — L98492 Non-pressure chronic ulcer of skin of other sites with fat layer exposed: Secondary | ICD-10-CM | POA: Diagnosis not present

## 2018-07-12 DIAGNOSIS — C773 Secondary and unspecified malignant neoplasm of axilla and upper limb lymph nodes: Secondary | ICD-10-CM | POA: Diagnosis not present

## 2018-07-12 DIAGNOSIS — M1990 Unspecified osteoarthritis, unspecified site: Secondary | ICD-10-CM | POA: Diagnosis not present

## 2018-07-12 MED FILL — ZELBORAF 240 MG TABS: 240 | 30 days supply | Qty: 180 | Fill #2

## 2018-07-13 ENCOUNTER — Ambulatory Visit: Payer: Medicare Other | Admitting: Occupational Therapy

## 2018-07-13 DIAGNOSIS — I972 Postmastectomy lymphedema syndrome: Secondary | ICD-10-CM | POA: Diagnosis not present

## 2018-07-13 DIAGNOSIS — I890 Lymphedema, not elsewhere classified: Secondary | ICD-10-CM

## 2018-07-13 DIAGNOSIS — E8989 Other postprocedural endocrine and metabolic complications and disorders: Secondary | ICD-10-CM | POA: Diagnosis not present

## 2018-07-13 NOTE — Therapy (Signed)
[place] MAIN [facility] SERVICES [address] [place], [place], [address] Phone: [phone]   Fax:  [phone]  Occupational Therapy Treatment  Patient Details  Name: Louis Davies MRN: [id number] Date of Birth: June 27, 1951 Referring Provider: Lloyd Huger, MD   Encounter Date: 07/13/2018  OT End of Session - 07/13/18 1705    Visit Number  5    Number of Visits  36    Date for OT Re-Evaluation  09/27/18    OT Start Time  0210    OT Stop Time  0315    OT Time Calculation (min)  65 min    Activity Tolerance  Patient tolerated treatment well;Patient limited by fatigue;No increased pain    Behavior During Therapy  WFL for tasks assessed/performed       Past Medical History:  Diagnosis Date  . Anemia   . Arthritis   . Cancer ([place])   . Complication of anesthesia   . PONV (postoperative nausea and vomiting)     Past Surgical History:  Procedure Laterality Date  . AXILLARY LYMPH NODE DISSECTION Left 06/26/2017   Procedure: EXCISION OF LEFT AXILLARY MASS;  Surgeon: Johnathan Hausen, MD;  Location: [facility] ORS;  Service: General;  Laterality: Left;  . DIAGNOSTIC LAPAROSCOPY    . EMBOLIZATION Left 05/11/2018   Procedure: EMBOLIZATION;  Surgeon: Katha Cabal, MD;  Location: [facility];  Service: Cardiovascular;  Laterality: Left;  . LAPAROTOMY N/A 10/14/2017   Procedure: EXPLORATORY LAPAROTOMY;  Surgeon: Clayburn Pert, MD;  Location: [facility] OR[facility];  Service: General;  Laterality: N/A;  . VASECTOMY      There were no vitals filed for this visit.  Subjective Assessment - 07/13/18 1705    Subjective   Pt presents for OT visit 5/36 for Complete Decongestive Therapy (CDT) for melanoma-related  LUE/LUq lymphedema (LE) . Pt presents with loosened compression wraps in place. Pt reports it is very difficult for him to work in compression wraps all day because the wraps keep getting loose and falling down                    OT Treatments/Exercises (OP) - 07/13/18 0001      ADLs   ADL Education Given  Yes      Manual Therapy   Manual Therapy  Edema management;Soft tissue mobilization;Myofascial release;Manual Lymphatic Drainage (MLD);Compression Bandaging    Manual therapy comments  skin care to L volare arm using castor oil; during manual theyrapy to hydrate and  mobilize skin    Edema Management  completed anatomical measurements for Off the shelf compression arm sleeve. Pt fits into a Mediven, ccl 2 sleeve and glove, sz IV.     Myofascial Release  very gentle myofacial release to distal LUE in effort to release cording in axilla.     Manual Lymphatic Drainage (MLD)  MLD concentrated at proximal pathways today in effort to improve lymphangiomotoricity and stimulate formation of colateral lymphatic circulation.MLD to LUQ/ LUE using alternative pathways, including : short neck sequence, MLD to contralateral axilla using anterior and posterior anastamoses, and using ipsilateral axillary-inguinal pathway in sidelying..     Compression Bandaging  Multilayer gradient compression wraps to LUE using short stretch wraps over Rosidol foal. Initial layer of cotton stockinett applied to L arm and hand . Lymphedema hand bandaging in standard configuration using elastic guaze to digits 1-5. Cotton stockinett applied from base of fingers ( and folded back at MPs) extending to proximal arm  at border of axilla. Foal over stockinett w/ lite tension and ~ 50% overalp ; 6 cm shrort atretch wrap to hand starting at MPs and using finger of 8over hand x 2, then extending over wrist w/ light tension and 50% overlap. Next, 8 cm wrap applied at wrist and extending proximally w/ 50% overlap. Finally. 10 cm wrap anchored at wrist again and extending proximally w/ 50% overlap. Stretch net applied over entire wrap system to assist w/ keeping all in place.             OT Education - 07/13/18 1704    Education  Details  Continued skilled Pt/caregiver education  And LE ADL training throughout visit for lymphedema self care/ home program, including compression wrapping, compression garment and device wear/care, lymphatic pumping ther ex, simple self-MLD, and skin care. Discussed progress towards goals.    Person(s) Educated  Patient    Methods  Explanation;Demonstration;Handout;Tactile cues;Verbal cues    Comprehension  Verbalized understanding;Need further instruction;Returned demonstration;Verbal cues required;Tactile cues required          OT Long Term Goals - 06/30/18 1323      OT LONG TERM GOAL #1   Title  Pt will be able to apply multi-layer short stretch compression wraps using correct gradient techniques with independently , but with extra time  to achieve optimal limb volume reduction during Intensive Phase CDT, and to return affected limb(s) , as closely as possible, to premorbid size and shape.    Baseline  Dependent    Time  2    Period  Weeks    Status  New    Target Date  --   10th OT Rx visit     OT LONG TERM GOAL #2   Title  Pt will demonstrate understanding of lymphedema (LE) precautions / prevention principals, including signs / symptoms of cellulitis infection with modified independence using LE Workbook as printed reference to identify 6 precautions without verbal cues by end of 3rd  OT Rx visit.     Baseline  no knowledge    Time  3    Period  Days    Status  New    Target Date  --   3rd OT Rx visit     OT LONG TERM GOAL #3   Title  Pt to achieve no less than 10% limb volume reduction in affected limb(s) during Intensive Phase CDT to control limb swelling, to improve tissue integrity and immune function, to improve ADLs performance and to improve functional mobility/ transfers.    Baseline  dependent    Time  12    Period  Weeks    Status  New    Target Date  09/27/18      OT LONG TERM GOAL #4   Title  Pt will achieve 100% compliance with daily LE self-care home  program components, including proper skin care, simple self-MLD, gradient compression wraps/ garments, and therapeutic exercise to ensure optimal Intensive Phase limb volume reduction to expedite compression garment/ device fitting.    Baseline  Max A    Time  12    Period  Weeks    Status  New    Target Date  09/27/18      OT LONG TERM GOAL #5   Title  Pt will demonstrate competent use of assistive devices during LE self-care training to improve ability to don and doff compression garments/devices with modified assistance to ensure optimal LE self-management over time.  Baseline  Max A    Time  12    Status  New    Target Date  09/27/18      Long Term Additional Goals   Additional Long Term Goals  Yes      OT LONG TERM GOAL #6   Title  Pt will retain optimal limb volume reductions achieved during Intensive Phase CDT with no more than 3% volume increase  without CG assistance to limit LE progression and further functional decline.    Baseline  Dependent    Time  6    Period  Months    Status  New    Target Date  12/27/18            Plan - 07/13/18 1711    Clinical Impression Statement  pT EXPRESSIONG FRUSTRATION WITH WORKING IN COMPRESSION WRAPS. cOMPLETED MEASUREMENTS FOR OTS compression sleeve for trial during work hours. Pt able to be fit with Mediven harmnony sz 4 ccl 1 glove and 30-40 harmony arm sleeve. Pt  instructed to wrap for all non-work hours, including HOS. Provided MLD and compression wraps as established. Most likely Pt will ben56fit from custom compression for work hopurs to handle greater lymphatic load, but trial will be informative on this linical decision. Conty as per POC.    Occupational performance deficits (Please refer to evaluation for details):  ADL's;Leisure;Social Participation;IADL's;Rest and Sleep;Work;Other   body image   Rehab Potential  Good    OT Frequency  3x / week    OT Duration  Other (comment)   and PRN   OT Treatment/Interventions   Self-care/ADL training;Therapeutic exercise;Manual lymph drainage;Compression bandaging;Patient/family education;Other (comment);Energy conservation;Therapeutic activities;Balance training;DME and/or AE instruction;Manual Therapy;Passive range of motion    Clinical Decision Making  Multiple treatment options, significant modification of task necessary    Recommended Other Services  fit with custom, clat knit LUE compression sleeve and HOS device. Consider seamless compression glove and vest. Consider kinesiotape during Intensive Phase    Consulted and Agree with Plan of Care  Patient       Patient will benefit from skilled therapeutic intervention in order to improve the following deficits and impairments:  Decreased endurance, Decreased mobility, Decreased skin integrity, Impaired sensation, Decreased knowledge of precautions, Decreased range of motion, Decreased scar mobility, Increased edema, Pain, Decreased activity tolerance, Decreased knowledge of use of DME, Decreased strength, Impaired flexibility, Impaired UE functional use, Decreased psychosocial skills, Decreased balance, Other (comment)(balance and gait negatively impacted by guarding  posture , impaired armswing and body asymmetry)  Visit Diagnosis: Lymphedema of upper extremity following lymphadenectomy    Problem List Patient Active Problem List   Diagnosis Date Noted  . Anemia 05/10/2018  . Perforated viscus   . Ileus ([place])   . Constipation 10/10/2017  . Metastatic melanoma ([place]) 07/07/2017  . Axillary mass, left 06/26/2017  . Chronic pain of left knee 12/14/2015  . Influenza vaccination declined 11/27/2015   Andrey Spearman, MS, OTR/L, [facility] 07/13/18 5:16 PM   [place] MAIN [facility] SERVICES [address] [place], [place], [address] Phone: [phone]   Fax:  [phone]  Name: KASHTYN JANKOWSKI MRN: [id number] Date of Birth: 08-20-1951

## 2018-07-14 NOTE — Progress Notes (Signed)
Louis Davies, Louis Davies ([id number]) Visit Report for 07/12/2018 Chief Complaint Document Details Patient Name: Louis Davies, Louis Davies Date of Service: 07/12/2018 2:15 PM Medical Record Number: [id number] Patient Account Number: [id number] Date of Birth/Sex: Dec 10, 1950 (67 y.o. M) Treating RN: Roger Shelter Primary Care Provider: Alvera Singh Other Clinician: Referring Provider: Alvera Singh Treating Provider/Extender: Melburn Hake, HOYT Weeks in Treatment: 3 Information Obtained from: Patient Chief Complaint Left axillary metastatic ulcer Electronic Signature(s) Signed: 07/13/2018 9:18:02 AM By: Worthy Keeler PA-C Entered By: Worthy Keeler on 07/12/2018 14:23:03 Louis Davies ([id number]) -------------------------------------------------------------------------------- HPI Details Patient Name: Louis Davies Date of Service: 07/12/2018 2:15 PM Medical Record Number: [id number] Patient Account Number: [id number] Date of Birth/Sex: 1951/08/05 (67 y.o. 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 The patient following excision did undergo radiation therapy which he has completed at this point. He is still on oral chemotherapy at this time however. Patient states that he does have some discomfort at the site but one of the biggest issues is actually the malodorous smell that is emanating from the wound that he states make some very self-conscious. He also did have an infection recently that he just completed oral antibiotic therapy for he was unsure of the antibiotic that he was on. He  states that just ended yesterday. Nonetheless No fevers, chills, nausea, or vomiting noted at this time. 06/22/18 on evaluation today patient's ulcer in the left axillary region actually has a fairly strong odor even I feel more so than last week at this point. He has been tolerating the dressing changes without complication. With that being said I think we may want to culture this to ensure there is no infection I know he's recently been on antibiotics before I first saw him but nonetheless I feel like this is going to be likely necessary in order to try to help control some of the older especially if it's coming from an infection 06/28/18 on evaluation today patient appears to be doing a little better in my pinion in regard to the drainage from the left axillary ulcer. He's been tolerating the dressing changes without complication. With that being said I do have his culture for review today which shows that it was positive for Enterococcus faecalis and is sensitive to ampicillin. With that being said he seems to be tolerating the dressing changes very well and in general I am pleased with what I'm seeing. I'm unsure of how much of the cancer may be remaining the patient also states that he will be having a pet scan at the end of this month on the 30th he believes. 07/12/18 on evaluation today patient actually appears to be doing a little better in regard to his ulcer and the left axillary region. He's been tolerating the dressing changes without complication the odor is better I do believe that the antibiotic has been of some benefit in this regard. Fortunately there's no evidence of overt infection at this time. Electronic Signature(s) Signed: 07/13/2018 9:18:02 AM By: Worthy Keeler PA-C Entered By: Worthy Keeler on 07/12/2018 16:56:40 Louis Davies ([id number]) -------------------------------------------------------------------------------- Physical Exam Details Patient Name: Louis Davies Date of Service: 07/12/2018  2:15 PM Medical Record Number: [id number] Patient Account Number: [id number] Date of Birth/Sex: [birth year] (67 y.o. M) Treating RN: Roger Shelter Primary Care Provider: Alvera Singh Other Clinician: Referring Provider: Alvera Singh Treating Provider/Extender: STONE III, HOYT Weeks in Treatment: 3 Constitutional Well-nourished and well-hydrated in no acute distress. Respiratory normal breathing without difficulty. clear to auscultation bilaterally. Cardiovascular regular rate and rhythm with normal S1, S2. Psychiatric this patient is able to make decisions and demonstrates good insight into disease process. Alert and Oriented x 3. pleasant and cooperative. Notes Patient's wound bed does show evidence of necrotic tissue in the base of the wound again this may still be left over from the radiation therapy although again I'm not completely sure if everything has been removed from the standpoint of the cancer itself he has a pet scan later this month. No sharp debridement performed opposite due to the nature of this wound. Electronic Signature(s) Signed: 07/13/2018 9:18:02 AM By: Worthy Keeler PA-C Entered By: Worthy Keeler on 07/12/2018 16:57:34 Louis Davies ([id number]) -------------------------------------------------------------------------------- Physician Orders Details Patient Name: Louis Davies Date of Service: 07/12/2018 2:15 PM Medical Record Number: [id number] Patient Account Number: [id number] Date of Birth/Sex: 1951-02-28 (67 y.o. 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 o May Shower, gently pat wound dry prior to applying new dressing. - wash with hibiclens Anesthetic (add to Medication List) Wound #1 Left Axilla o Topical Lidocaine 4% cream applied to wound bed prior to debridement (In Clinic Only). Skin Barriers/Peri-Wound Care Wound #1 Left Axilla o Skin Prep Primary Wound Dressing Wound #1 Left Axilla o Silver Alginate - rope Secondary Dressing o ABD pad o Other - carboflex Dressing Change Frequency Wound #1 Left Axilla o Change dressing every day. Follow-up Appointments Wound #1 Left Axilla o Return Appointment in 2 weeks. Additional Orders / Instructions Wound #1 Left Axilla o Increase protein intake. Electronic Signature(s) Signed: 07/13/2018 9:18:02 AM By: Donney Rankins, [place] DMarland Kitchen ([id number]) Signed: 07/13/2018 10:33:34 AM By: Roger Shelter Entered By: Roger Shelter on 07/12/2018 15:18:19 Louis Davies ([id number]) -------------------------------------------------------------------------------- Problem List Details Patient Name: Louis Davies Date of Service: 07/12/2018 2:15 PM Medical Record Number: [id number] Patient Account Number: [id number] Date of Birth/Sex: [birth year] (66 y.o. 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 The patient following excision did undergo radiation therapy which he has completed at this point. He is still on oral chemotherapy at this time however. Patient states that he does have some discomfort at the site but one of the biggest issues is actually the malodorous smell that is emanating from the wound that he states make some very self-conscious. He also did have an infection recently that he just completed oral antibiotic therapy for he was unsure of the antibiotic that he was on. He states that just ended yesterday. Nonetheless No fevers, chills, nausea, or vomiting noted at this time. 06/22/18 on evaluation today patient's ulcer in the left axillary region actually has a fairly strong odor even I feel more so than last week at this point. He has been tolerating the dressing changes without complication. With that being said I think we may want to culture this to ensure there is no infection I know he's recently been on antibiotics before I  first saw him but nonetheless I feel like this is going to be likely necessary in order to try to help control some of the older especially if it's coming from an infection 06/28/18 on evaluation today patient appears to be doing a little better in my pinion in regard to the drainage from the left axillary ulcer. He's been tolerating the dressing changes without complication. With that being said I do have his culture for review today which shows that it was positive for Enterococcus faecalis and is sensitive to ampicillin. With that being said he seems to be tolerating the dressing changes very well and in general I am pleased with what I'm seeing. I'm unsure of how much of the cancer may be remaining the patient also states that he will be having a pet scan at the end of this month on the 30th he believes. 07/12/18 on evaluation today patient actually appears to be doing a little better in regard to his ulcer and the left axillary region. He's been tolerating the dressing changes without complication the odor is better I do believe that the antibiotic has been of some benefit in this regard. Fortunately there's no evidence of overt infection at this time. Patient History Information obtained from Patient. Family History Cancer - Father, Diabetes - Mother, Hypertension - Mother, No family history of Heart Disease, Kidney Disease, Lung Disease, Stroke, Thyroid Problems, Tuberculosis. Social History Never smoker, Marital Status - Divorced, Alcohol Use - Never, Drug Use - No History, Caffeine Use - Rarely. Medical And Surgical History Notes Constitutional Symptoms (General Health) Radiation and Chemo for Metastatic Melanoma Review of Systems (ROS) Louis Davies, Louis Davies ([id number]) Constitutional Symptoms (General Health) Denies complaints or symptoms of Fever, Chills. Respiratory The patient has no complaints or symptoms. Cardiovascular The patient has no complaints or symptoms. Psychiatric The  patient has no complaints or symptoms. Objective Constitutional Well-nourished and well-hydrated in no acute distress. Vitals Time Taken: 2:37 PM, Height: 71 in, Weight: 185.6 lbs, BMI: 25.9, Temperature: 98.8 F, Pulse: 73 bpm, Respiratory Rate: 16 breaths/min, Blood Pressure: 103/57 mmHg. Respiratory normal breathing without difficulty. clear to auscultation bilaterally. Cardiovascular regular rate and rhythm with normal S1, S2. Psychiatric this patient  is able to make decisions and demonstrates good insight into disease process. Alert and Oriented x 3. pleasant and cooperative. General Notes: Patient's wound bed does show evidence of necrotic tissue in the base of the wound again this may still be left over from the radiation therapy although again I'm not completely sure if everything has been removed from the standpoint of the cancer itself he has a pet scan later this month. No sharp debridement performed opposite due to the nature of this wound. Integumentary (Hair, Skin) Wound #1 status is Open. Original cause of wound was Gradually Appeared. The wound is located on the Left Axilla. The wound measures 1cm length x 0.9cm width x 1.9cm depth; 0.707cm^2 area and 1.343cm^3 volume. There is Fat Layer (Subcutaneous Tissue) Exposed exposed. There is no tunneling or undermining noted. There is a large amount of purulent drainage noted. The wound margin is flat and intact. There is no granulation within the wound bed. There is a large (67-100%) amount of necrotic tissue within the wound bed including Adherent Slough. The periwound skin appearance exhibited: Rash. The periwound skin appearance did not exhibit: Callus, Crepitus, Excoriation, Induration, Scarring, Atrophie Blanche, Cyanosis, Ecchymosis, Hemosiderin Staining, Mottled, Pallor, Rubor, Erythema. 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 May Shower, gently pat wound dry prior to applying new dressing. - wash with hibiclens Anesthetic (add to Medication List): Wound #1 Left Axilla: Topical Lidocaine 4% cream applied to wound bed prior to debridement (In Clinic Only). Skin Barriers/Peri-Wound Care: Wound #1 Left Axilla: Skin Prep Primary Wound Dressing: Wound #1 Left Axilla: Silver Alginate - rope Secondary Dressing: ABD pad Other - carboflex Dressing Change Frequency: Wound #1 Left Axilla: Change dressing every day. Follow-up Appointments: Wound #1 Left Axilla: Return Appointment in 2 weeks. Additional Orders / Instructions: Wound #1 Left Axilla: Increase protein intake. I'm gonna suggest currently that we continue with the above wound care measures for one more week. His drainage has greatly improved. If you continually is to do well then at next week's visit I am considering switching him to a Vashe soaked gauze/packing strip into the wound and then cover this with an absorptive outer dressing. We'll see how things do and then make our decisions from there. If anything changes or worsens patient will let me know. Please see above for specific wound care orders. We will see patient for re-evaluation in 1 week(s) here in the clinic. If anything worsens or changes patient will contact our office for additional recommendations. Electronic Signature(s) Signed: 07/13/2018 9:18:02 AM By: Worthy Keeler PA-C Entered By: Worthy Keeler on 07/12/2018 16:58:22 Louis Davies ([id number]) -------------------------------------------------------------------------------- ROS/PFSH Details Patient Name: Louis Davies Date of Service: 07/12/2018 2:15 PM Medical Record Number: [id number] Patient Account Number: [id number] Date of  Birth/Sex: 1951/04/30 (66 y.o. M) Treating RN: Roger Shelter Primary Care Provider: Alvera Singh Other Clinician: Referring Provider: Alvera Singh Treating Provider/Extender: Melburn Hake, HOYT Weeks in Treatment: 3 Information Obtained From Patient Wound History Do you currently have one or more open woundso Yes How many open wounds do you currently haveo 1 Approximately how long have you had your woundso 2 months How have you been treating your wound(s) until nowo bandage Has your wound(s) ever healed and then re-openedo No Have you  had any lab work done in the past montho Yes Have you tested positive for an antibiotic resistant organism (MRSA, VRE)o No Have you tested positive for osteomyelitis (bone infection)o No Have you had any tests for circulation on your legso No Have you had other problems associated with your woundso Infection, Swelling Constitutional Symptoms (General Health) Complaints and Symptoms: Negative for: Fever; Chills Medical History: Past Medical History Notes: Radiation and Chemo for Metastatic Melanoma Eyes Medical History: Negative for: Cataracts; Glaucoma; Optic Neuritis Ear/Nose/Mouth/Throat Medical History: Negative for: Chronic sinus problems/congestion; Middle ear problems Hematologic/Lymphatic Medical History: Negative for: Anemia; Hemophilia; Human Immunodeficiency Virus; Lymphedema; Sickle Cell Disease Respiratory Complaints and Symptoms: No Complaints or Symptoms Medical History: Negative for: Aspiration; Asthma; Chronic Obstructive Pulmonary Disease (COPD); Pneumothorax; Sleep Apnea; Tuberculosis Cardiovascular Louis Davies, Louis Davies ([id number]) Complaints and Symptoms: No Complaints or Symptoms Medical History: Negative for: Angina; Arrhythmia; Congestive Heart Failure; Coronary Artery Disease; Deep Vein Thrombosis; Hypertension; Hypotension; Myocardial Infarction; Peripheral Arterial Disease; Peripheral Venous Disease;  Phlebitis; Vasculitis Gastrointestinal Medical History: Negative for: Cirrhosis ; Colitis; Crohnos; Hepatitis A; Hepatitis B; Hepatitis C Endocrine Medical History: Negative for: Type I Diabetes; Type II Diabetes Genitourinary Medical History: Negative for: End Stage Renal Disease Immunological Medical History: Negative for: Lupus Erythematosus; Raynaudos; Scleroderma Integumentary (Skin) Medical History: Negative for: History of Burn; History of pressure wounds Musculoskeletal Medical History: Positive for: Osteoarthritis - left knee Negative for: Gout; Rheumatoid Arthritis; Osteomyelitis Neurologic Medical History: Negative for: Dementia; Neuropathy; Quadriplegia; Paraplegia; Seizure Disorder Oncologic Medical History: Positive for: Received Chemotherapy; Received Radiation - Axillary Psychiatric Complaints and Symptoms: No Complaints or Symptoms Medical History: Negative for: Anorexia/bulimia; Confinement Anxiety Immunizations Pneumococcal VaccineGREIG, Louis Davies ([id number]) Received Pneumococcal Vaccination: Yes Implantable Devices Family and Social History Cancer: Yes - Father; Diabetes: Yes - Mother; Heart Disease: No; Hypertension: Yes - Mother; Kidney Disease: No; Lung Disease: No; Stroke: No; Thyroid Problems: No; Tuberculosis: No; Never smoker; Marital Status - Divorced; Alcohol Use: Never; Drug Use: No History; Caffeine Use: Rarely; Living Will: Yes (Copy provided) Physician Affirmation I have reviewed and agree with the above information. Electronic Signature(s) Signed: 07/13/2018 9:18:02 AM By: Worthy Keeler PA-C Signed: 07/13/2018 10:33:34 AM By: Roger Shelter Entered By: Worthy Keeler on 07/12/2018 16:57:08 Louis Davies ([id number]) -------------------------------------------------------------------------------- SuperBill Details Patient Name: Louis Davies Date of Service: 07/12/2018 Medical Record Number: [id number] Patient Account  Number: [id number] Date of Birth/Sex: 1951/03/19 (66 y.o. 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

## 2018-07-15 ENCOUNTER — Ambulatory Visit: Payer: Medicare Other | Admitting: Occupational Therapy

## 2018-07-15 DIAGNOSIS — E8989 Other postprocedural endocrine and metabolic complications and disorders: Secondary | ICD-10-CM | POA: Diagnosis not present

## 2018-07-15 DIAGNOSIS — I972 Postmastectomy lymphedema syndrome: Secondary | ICD-10-CM | POA: Diagnosis not present

## 2018-07-15 DIAGNOSIS — I890 Lymphedema, not elsewhere classified: Secondary | ICD-10-CM | POA: Diagnosis not present

## 2018-07-15 NOTE — Therapy (Signed)
[place] MAIN [facility] SERVICES [address] [place], [place], [address] Phone: [phone]   Fax:  [phone]  Occupational Therapy Treatment  Patient Details  Name: Louis Davies MRN: [id number] Date of Birth: 1951/08/21 No data recorded  Encounter Date: 07/15/2018  OT End of Session - 07/15/18 1630    Visit Number  6    Number of Visits  36    Date for OT Re-Evaluation  09/27/18    OT Start Time  0245    OT Stop Time  0400    OT Time Calculation (min)  75 min    Activity Tolerance  Patient tolerated treatment well;Patient limited by fatigue;No increased pain    Behavior During Therapy  WFL for tasks assessed/performed       Past Medical History:  Diagnosis Date  . Anemia   . Arthritis   . Cancer ([place])   . Complication of anesthesia   . PONV (postoperative nausea and vomiting)     Past Surgical History:  Procedure Laterality Date  . AXILLARY LYMPH NODE DISSECTION Left 06/26/2017   Procedure: EXCISION OF LEFT AXILLARY MASS;  Surgeon: Johnathan Hausen, MD;  Location: [facility] ORS;  Service: General;  Laterality: Left;  . DIAGNOSTIC LAPAROSCOPY    . EMBOLIZATION Left 05/11/2018   Procedure: EMBOLIZATION;  Surgeon: Katha Cabal, MD;  Location: [place];  Service: Cardiovascular;  Laterality: Left;  . LAPAROTOMY N/A 10/14/2017   Procedure: EXPLORATORY LAPAROTOMY;  Surgeon: Clayburn Pert, MD;  Location: [facility] ORS;  Service: General;  Laterality: N/A;  . VASECTOMY      There were no vitals filed for this visit.  Subjective Assessment - 07/15/18 1625    Subjective   Pt presents for OT visit 6/36 for Complete Decongestive Therapy (CDT) for melanoma-related  LUE/LUq lymphedema (LE) . Pt presents with class 2 compression sleeve in place leaned last visit.                    OT Treatments/Exercises (OP) - 07/15/18 0001      ADLs   ADL Education Given  Yes      Manual Therapy   Manual Therapy  Edema  management;Soft tissue mobilization;Myofascial release;Manual Lymphatic Drainage (MLD);Compression Bandaging    Manual therapy comments  skin care to L volar arm using castor oil during manual theyrapy to hydrate and  mobilize skin    Myofascial Release  very gentle myofacial release to distal LUE in effort to release cording in axilla.     Manual Lymphatic Drainage (MLD)  MLD to LUE/ LUQ in supine and R sidelying utilizing short neck sequence, intercostal pathways, posterior and anterior axillary anastomoses, and utilizing L axillary- inginal anastomosis. Increased limb volume noted today reduced w/ MLD     Compression Bandaging  Multilayer gradient compression wraps to LUE using short stretch wraps over Rosidol foal. Initial layer of cotton stockinett applied to L arm and hand . Lymphedema hand bandaging in standard configuration using elastic guaze to digits 1-5. Cotton stockinett applied from base of fingers ( and folded back at MPs) extending to proximal arm at border of axilla. Foal over stockinett w/ lite tension and ~ 50% overalp ; 6 cm shrort atretch wrap to hand starting at MPs and using finger of 8over hand x 2, then extending over wrist w/ light tension and 50% overlap. Next, 8 cm wrap applied at wrist and extending proximally w/ 50% overlap. Finally. 10 cm wrap anchored at wrist  again and extending proximally w/ 50% overlap. Stretch net applied over entire wrap system to assist w/ keeping all in place.             OT Education - 07/15/18 1629    Education Details  Continued skilled Pt/caregiver education  And LE ADL training throughout visit for lymphedema self care/ home program, including compression wrapping, compression garment and device wear/care, lymphatic pumping ther ex, simple self-MLD, and skin care. Discussed progress towards goals.    Person(s) Educated  Patient    Methods  Explanation;Demonstration;Handout;Tactile cues;Verbal cues    Comprehension  Verbalized  understanding;Need further instruction;Returned demonstration;Verbal cues required;Tactile cues required          OT Long Term Goals - 06/30/18 1323      OT LONG TERM GOAL #1   Title  Pt will be able to apply multi-layer short stretch compression wraps using correct gradient techniques with independently , but with extra time  to achieve optimal limb volume reduction during Intensive Phase CDT, and to return affected limb(s) , as closely as possible, to premorbid size and shape.    Baseline  Dependent    Time  2    Period  Weeks    Status  New    Target Date  --   10th OT Rx visit     OT LONG TERM GOAL #2   Title  Pt will demonstrate understanding of lymphedema (LE) precautions / prevention principals, including signs / symptoms of cellulitis infection with modified independence using LE Workbook as printed reference to identify 6 precautions without verbal cues by end of 3rd  OT Rx visit.     Baseline  no knowledge    Time  3    Period  Days    Status  New    Target Date  --   3rd OT Rx visit     OT LONG TERM GOAL #3   Title  Pt to achieve no less than 10% limb volume reduction in affected limb(s) during Intensive Phase CDT to control limb swelling, to improve tissue integrity and immune function, to improve ADLs performance and to improve functional mobility/ transfers.    Baseline  dependent    Time  12    Period  Weeks    Status  New    Target Date  09/27/18      OT LONG TERM GOAL #4   Title  Pt will achieve 100% compliance with daily LE self-care home program components, including proper skin care, simple self-MLD, gradient compression wraps/ garments, and therapeutic exercise to ensure optimal Intensive Phase limb volume reduction to expedite compression garment/ device fitting.    Baseline  Max A    Time  12    Period  Weeks    Status  New    Target Date  09/27/18      OT LONG TERM GOAL #5   Title  Pt will demonstrate competent use of assistive devices during LE  self-care training to improve ability to don and doff compression garments/devices with modified assistance to ensure optimal LE self-management over time.    Baseline  Max A    Time  12    Status  New    Target Date  09/27/18      Long Term Additional Goals   Additional Long Term Goals  Yes      OT LONG TERM GOAL #6   Title  Pt will retain optimal limb volume reductions achieved during  Intensive Phase CDT with no more than 3% volume increase  without CG assistance to limit LE progression and further functional decline.    Baseline  Dependent    Time  6    Period  Months    Status  New    Target Date  12/27/18            Plan - 07/15/18 1631    Clinical Impression Statement  LUE and hand appears more swollen in sleeve today after Pt worked all day. Hand is moderately swollen as Pt did not utilize glove. Despite being less active today and driving only , arm swelling was not optimally containied in OTS sleeve loaned lasty session. Provided MLD, skin care and reapplied compression wraps . Pt performed passive stretching to L shoulder and soft tissue in axilla and latural trunk while in sidelying during MLD. Appled 3 layer gradient wrap after MLD. Pt instructed to use sleeve during the day with glove on Monday, then after shower apply wraps for downtime in evening and HOS. Cont as per POC.    Occupational performance deficits (Please refer to evaluation for details):  ADL's;Leisure;Social Participation;IADL's;Rest and Sleep;Work;Other   body image   Rehab Potential  Good    OT Frequency  3x / week    OT Duration  Other (comment)   and PRN   OT Treatment/Interventions  Self-care/ADL training;Therapeutic exercise;Manual lymph drainage;Compression bandaging;Patient/family education;Other (comment);Energy conservation;Therapeutic activities;Balance training;DME and/or AE instruction;Manual Therapy;Passive range of motion    Clinical Decision Making  Multiple treatment options, significant  modification of task necessary    Recommended Other Services  fit with custom, clat knit LUE compression sleeve and HOS device. Consider seamless compression glove and vest. Consider kinesiotape during Intensive Phase    Consulted and Agree with Plan of Care  Patient       Patient will benefit from skilled therapeutic intervention in order to improve the following deficits and impairments:  Decreased endurance, Decreased mobility, Decreased skin integrity, Impaired sensation, Decreased knowledge of precautions, Decreased range of motion, Decreased scar mobility, Increased edema, Pain, Decreased activity tolerance, Decreased knowledge of use of DME, Decreased strength, Impaired flexibility, Impaired UE functional use, Decreased psychosocial skills, Decreased balance, Other (comment)(balance and gait negatively impacted by guarding  posture , impaired armswing and body asymmetry)  Visit Diagnosis: Lymphedema of upper extremity following lymphadenectomy    Problem List Patient Active Problem List   Diagnosis Date Noted  . Anemia 05/10/2018  . Perforated viscus   . Ileus ([place])   . Constipation 10/10/2017  . Metastatic melanoma ([place]) 07/07/2017  . Axillary mass, left 06/26/2017  . Chronic pain of left knee 12/14/2015  . Influenza vaccination declined 11/27/2015    Andrey Spearman, MS, OTR/L, [facility] 07/15/18 4:35 PM    [place] MAIN [facility] SERVICES [address] [place], [place], [address] Phone: [phone]   Fax:  [phone]  Name: TERRAL COOKS MRN: [id number] Date of Birth: [birth year]

## 2018-07-16 NOTE — Progress Notes (Signed)
EVIN, LOISEAU ([id number]) Visit Report for 07/12/2018 Arrival Information Details Patient Name: Louis Davies, Louis Davies Date of Service: 07/12/2018 2:15 PM Medical Record Number: [id number] Patient Account Number: [id number] Date of Birth/Sex: 05/05/1951 (67 y.o. M) Treating RN: Roger Shelter Primary Care Jadae Steinke: Alvera Singh Other Clinician: Referring Almin Livingstone: Alvera Singh Treating Monasia Lair/Extender: Melburn Hake, Louis Davies Weeks in Treatment: 3 Visit Information History Since Last Visit Added or deleted any medications: No Patient Arrived: Ambulatory Any new allergies or adverse reactions: No Arrival Time: 14:36 Had a fall or experienced change in No Accompanied By: self activities of daily living that may affect Transfer Assistance: None risk of falls: Patient Identification Verified: Yes Signs or symptoms of abuse/neglect since last visito No Secondary Verification Process Completed: Yes Hospitalized since last visit: No Implantable device outside of the clinic excluding No cellular tissue based products placed in the center since last visit: Has Dressing in Place as Prescribed: Yes Pain Present Now: No Electronic Signature(s) Signed: 07/12/2018 4:36:03 PM By: Lorine Bears RCP, RRT, CHT Entered By: Lorine Bears on 07/12/2018 14:38:34 Louis Davies ([id number]) -------------------------------------------------------------------------------- Clinic Level of Care Assessment Details Patient Name: Louis Davies Date of Service: 07/12/2018 2:15 PM Medical Record Number: [id number] Patient Account Number: [id number] Date of Birth/Sex: 05-13-1951 (67 y.o. M) Treating RN: Roger Shelter Primary Care Maisen Klingler: Alvera Singh Other Clinician: Referring Amrit Erck: Alvera Singh Treating Amrit Cress/Extender: Melburn Hake, Louis Davies Weeks in Treatment: 3 Clinic Level of Care Assessment Items TOOL 4 Quantity Score X - Use when only an EandM is  performed on FOLLOW-UP visit 1 0 ASSESSMENTS - Nursing Assessment / Reassessment X - Reassessment of Co-morbidities (includes updates in patient status) 1 10 X- 1 5 Reassessment of Adherence to Treatment Plan ASSESSMENTS - Wound and Skin Assessment / Reassessment X - Simple Wound Assessment / Reassessment - one wound 1 5 []  - 0 Complex Wound Assessment / Reassessment - multiple wounds []  - 0 Dermatologic / Skin Assessment (not related to wound area) ASSESSMENTS - Focused Assessment []  - Circumferential Edema Measurements - multi extremities 0 []  - 0 Nutritional Assessment / Counseling / Intervention []  - 0 Lower Extremity Assessment (monofilament, tuning fork, pulses) []  - 0 Peripheral Arterial Disease Assessment (using hand held doppler) ASSESSMENTS - Ostomy and/or Continence Assessment and Care []  - Incontinence Assessment and Management 0 []  - 0 Ostomy Care Assessment and Management (repouching, etc.) PROCESS - Coordination of Care X - Simple Patient / Family Education for ongoing care 1 15 []  - 0 Complex (extensive) Patient / Family Education for ongoing care []  - 0 Staff obtains Programmer, systems, Records, Test Results / Process Orders []  - 0 Staff telephones HHA, Nursing Homes / Clarify orders / etc []  - 0 Routine Transfer to another Facility (non-emergent condition) []  - 0 Routine Hospital Admission (non-emergent condition) []  - 0 New Admissions / Biomedical engineer / Ordering NPWT, Apligraf, etc. []  - 0 Emergency Hospital Admission (emergent condition) X- 1 10 Simple Discharge Coordination ESLI, [name] ([id number]) []  - 0 Complex (extensive) Discharge Coordination PROCESS - Special Needs []  - Pediatric / Minor Patient Management 0 []  - 0 Isolation Patient Management []  - 0 Hearing / Language / Visual special needs []  - 0 Assessment of Community assistance (transportation, D/C planning, etc.) []  - 0 Additional assistance / Altered mentation []  - 0 Support  Surface(s) Assessment (bed, cushion, seat, etc.) INTERVENTIONS - Wound Cleansing / Measurement X - Simple Wound Cleansing - one wound 1 5 []  - 0 Complex Wound Cleansing - multiple  wounds X- 1 5 Wound Imaging (photographs - any number of wounds) []  - 0 Wound Tracing (instead of photographs) X- 1 5 Simple Wound Measurement - one wound []  - 0 Complex Wound Measurement - multiple wounds INTERVENTIONS - Wound Dressings X - Small Wound Dressing one or multiple wounds 1 10 []  - 0 Medium Wound Dressing one or multiple wounds []  - 0 Large Wound Dressing one or multiple wounds []  - 0 Application of Medications - topical []  - 0 Application of Medications - injection INTERVENTIONS - Miscellaneous []  - External ear exam 0 []  - 0 Specimen Collection (cultures, biopsies, blood, body fluids, etc.) []  - 0 Specimen(s) / Culture(s) sent or taken to Lab for analysis []  - 0 Patient Transfer (multiple staff / Civil Service fast streamer / Similar devices) []  - 0 Simple Staple / Suture removal (25 or less) []  - 0 Complex Staple / Suture removal (26 or more) []  - 0 Hypo / Hyperglycemic Management (close monitor of Blood Glucose) []  - 0 Ankle / Brachial Index (ABI) - do not Louis Davies if billed separately X- 1 5 Vital Signs Louis Davies, Louis D. ([id number]) Has the patient been seen at the hospital within the last three years: Yes Total Score: 75 Level Of Care: New/Established - Level 2 Electronic Signature(s) Signed: 07/13/2018 10:33:34 AM By: Roger Shelter Entered By: Roger Shelter on 07/12/2018 15:18:51 Louis Davies ([id number]) -------------------------------------------------------------------------------- Lower Extremity Assessment Details Patient Name: Louis Davies Date of Service: 07/12/2018 2:15 PM Medical Record Number: [id number] Patient Account Number: [id number] Date of Birth/Sex: 1950-12-27 (67 y.o. M) Treating RN: Secundino Ginger Primary Care Sevyn Paredez: Alvera Singh Other  Clinician: Referring Merilyn Pagan: Alvera Singh Treating Jakorey Mcconathy/Extender: Melburn Hake, Louis Davies Weeks in Treatment: 3 Electronic Signature(s) Signed: 07/15/2018 8:43:30 AM By: Secundino Ginger Entered By: Secundino Ginger on 07/12/2018 14:48:47 Louis Davies ([id number]) -------------------------------------------------------------------------------- Multi Wound Chart Details Patient Name: Louis Davies Date of Service: 07/12/2018 2:15 PM Medical Record Number: [id number] Patient Account Number: [id number] Date of Birth/Sex: 02/28/1951 (67 y.o. M) Treating RN: Roger Shelter Primary Care Kayanna Mckillop: Alvera Singh Other Clinician: Referring Renald Haithcock: Alvera Singh Treating Raychelle Hudman/Extender: Louis Davies, Louis Davies Weeks in Treatment: 3 Vital Signs Height(in): 71 Pulse(bpm): 73 Weight(lbs): 185.6 Blood Pressure(mmHg): 103/57 Body Mass Index(BMI): 26 Temperature(F): 98.8 Respiratory Rate 16 (breaths/min): Photos: [N/A:N/A] Wound Location: Left Axilla N/A N/A Wounding Event: Gradually Appeared N/A N/A Primary Etiology: Malignant Wound N/A N/A Comorbid History: Osteoarthritis, Received N/A N/A Chemotherapy, Received Radiation Date Acquired: 04/07/2018 N/A N/A Weeks of Treatment: 3 N/A N/A Wound Status: Open N/A N/A Measurements L x W x D 1x0.9x1.9 N/A N/A (cm) Area (cm) : 0.707 N/A N/A Volume (cm) : 1.343 N/A N/A % Reduction in Area: 9.10% N/A N/A % Reduction in Volume: 28.00% N/A N/A Classification: Full Thickness Without N/A N/A Exposed Support Structures Exudate Amount: Large N/A N/A Exudate Type: Purulent N/A N/A Exudate Color: yellow, brown, green N/A N/A Wound Margin: Flat and Intact N/A N/A Granulation Amount: None Present (0%) N/A N/A Necrotic Amount: Large (67-100%) N/A N/A Exposed Structures: Fat Layer (Subcutaneous N/A N/A Tissue) Exposed: Yes Fascia: No Tendon: No Muscle: No Louis Davies, Louis Davies ([id number]) Joint: No Bone: No Epithelialization: None N/A  N/A Periwound Skin Texture: Rash: Yes N/A N/A Excoriation: No Induration: No Callus: No Crepitus: No Scarring: No Periwound Skin Moisture: No Abnormalities Noted N/A N/A Periwound Skin Color: Atrophie Blanche: No N/A N/A Cyanosis: No Ecchymosis: No Erythema: No Hemosiderin Staining: No Mottled: No Pallor: No Rubor: No Tenderness on Palpation: No N/A N/A Wound  Preparation: Ulcer Cleansing: N/A N/A Rinsed/Irrigated with Saline Topical Anesthetic Applied: None, Other: lidocaine 4% Treatment Notes Electronic Signature(s) Signed: 07/13/2018 10:33:34 AM By: Roger Shelter Entered By: Roger Shelter on 07/12/2018 15:13:55 Louis Davies ([id number]) -------------------------------------------------------------------------------- Multi-Disciplinary Care Plan Details Patient Name: Louis Davies Date of Service: 07/12/2018 2:15 PM Medical Record Number: [id number] Patient Account Number: [id number] Date of Birth/Sex: Jun 18, 1951 (67 y.o. M) Treating RN: Roger Shelter Primary Care Chalsey Leeth: Alvera Singh Other Clinician: Referring Bralyn Espino: Alvera Singh Treating Analayah Brooke/Extender: Melburn Hake, Louis Davies Weeks in Treatment: 3 Active Inactive ` Abuse / Safety / Falls / Self Care Management Nursing Diagnoses: Potential for falls Goals: Patient will not experience any injury related to falls Date Initiated: 06/15/2018 Target Resolution Date: 09/25/2018 Goal Status: Active Interventions: Assess Activities of Daily Living upon admission and as needed Assess fall risk on admission and as needed Assess: immobility, friction, shearing, incontinence upon admission and as needed Assess impairment of mobility on admission and as needed per policy Notes: ` Nutrition Nursing Diagnoses: Imbalanced nutrition Goals: Patient/caregiver agrees to and verbalizes understanding of need to use nutritional supplements and/or vitamins as prescribed Date Initiated: 06/15/2018 Target  Resolution Date: 09/25/2018 Goal Status: Active Interventions: Assess patient nutrition upon admission and as needed per policy Notes: ` Orientation to the Wound Care Program Nursing Diagnoses: Knowledge deficit related to the wound healing center program Goals: Patient/caregiver will verbalize understanding of the [facility] Date Initiated: 06/15/2018 Target Resolution Date: 06/26/2018 Louis Davies, Louis Davies ([id number]) Goal Status: Active Interventions: Provide education on orientation to the wound center Notes: ` Wound/Skin Impairment Nursing Diagnoses: Impaired tissue integrity Knowledge deficit related to ulceration/compromised skin integrity Goals: Ulcer/skin breakdown will have a volume reduction of 80% by week 12 Date Initiated: 06/15/2018 Target Resolution Date: 09/18/2018 Goal Status: Active Interventions: Assess patient/caregiver ability to perform ulcer/skin care regimen upon admission and as needed Assess ulceration(s) every visit Notes: Electronic Signature(s) Signed: 07/13/2018 10:33:34 AM By: Roger Shelter Entered By: Roger Shelter on 07/12/2018 15:13:46 Louis Davies ([id number]) -------------------------------------------------------------------------------- Pain Assessment Details Patient Name: Louis Davies Date of Service: 07/12/2018 2:15 PM Medical Record Number: [id number] Patient Account Number: [id number] Date of Birth/Sex: [birth year] (67 y.o. M) Treating RN: Roger Shelter Primary Care Julieth Tugman: Alvera Singh Other Clinician: Referring Tennie Grussing: Alvera Singh Treating Edoardo Laforte/Extender: Melburn Hake, Louis Davies Weeks in Treatment: 3 Active Problems Location of Pain Severity and Description of Pain Patient Has Paino No Site Locations Pain Management and Medication Current Pain Management: Electronic Signature(s) Signed: 07/12/2018 4:36:03 PM By: Lorine Bears RCP, RRT, CHT Signed: 07/13/2018 10:33:34 AM By:  Roger Shelter Entered By: Lorine Bears on 07/12/2018 14:38:50 Louis Davies ([id number]) -------------------------------------------------------------------------------- Wound Assessment Details Patient Name: Louis Davies Date of Service: 07/12/2018 2:15 PM Medical Record Number: [id number] Patient Account Number: [id number] Date of Birth/Sex: 1951/05/09 (67 y.o. M) Treating RN: Secundino Ginger Primary Care Levonte Molina: Alvera Singh Other Clinician: Referring Russie Gulledge: Alvera Singh Treating Cruz Bong/Extender: Melburn Hake, Louis Davies Weeks in Treatment: 3 Wound Status Wound Number: 1 Primary Malignant Wound Etiology: Wound Location: Left Axilla Wound Status: Open Wounding Event: Gradually Appeared Comorbid Osteoarthritis, Received Chemotherapy, Date Acquired: 04/07/2018 History: Received Radiation Weeks Of Treatment: 3 Clustered Wound: No Photos Photo Uploaded By: Secundino Ginger on 07/12/2018 14:56:35 Wound Measurements Length: (cm) 1 Width: (cm) 0.9 Depth: (cm) 1.9 Area: (cm) 0.707 Volume: (cm) 1.343 % Reduction in Area: 9.1% % Reduction in Volume: 28% Epithelialization: None Tunneling: No Undermining: No Wound Description Full Thickness Without Exposed Support Foul Odo Classification: Structures  Slough/F Wound Margin: Flat and Intact Exudate Large Amount: Exudate Type: Purulent Exudate Color: yellow, brown, green r After Cleansing: No ibrino Yes Wound Bed Granulation Amount: None Present (0%) Exposed Structure Necrotic Amount: Large (67-100%) Fascia Exposed: No Necrotic Quality: Adherent Slough Fat Layer (Subcutaneous Tissue) Exposed: Yes Tendon Exposed: No Muscle Exposed: No Joint Exposed: No Bone Exposed: No Louis Davies, Louis D. ([id number]) Periwound Skin Texture Texture Color No Abnormalities Noted: No No Abnormalities Noted: No Callus: No Atrophie Blanche: No Crepitus: No Cyanosis: No Excoriation: No Ecchymosis: No Induration:  No Erythema: No Rash: Yes Hemosiderin Staining: No Scarring: No Mottled: No Pallor: No Moisture Rubor: No No Abnormalities Noted: No Wound Preparation Ulcer Cleansing: Rinsed/Irrigated with Saline Topical Anesthetic Applied: None, Other: lidocaine 4%, Electronic Signature(s) Signed: 07/15/2018 8:43:30 AM By: Secundino Ginger Entered By: Secundino Ginger on 07/12/2018 14:54:11 Louis Davies ([id number]) -------------------------------------------------------------------------------- Vitals Details Patient Name: Louis Davies Date of Service: 07/12/2018 2:15 PM Medical Record Number: [id number] Patient Account Number: [id number] Date of Birth/Sex: 1950/11/08 (67 y.o. M) Treating RN: Roger Shelter Primary Care Jaianna Nicoll: Alvera Singh Other Clinician: Referring Para Cossey: Alvera Singh Treating Simmie Camerer/Extender: Louis Davies, Louis Davies Weeks in Treatment: 3 Vital Signs Time Taken: 14:37 Temperature (F): 98.8 Height (in): 71 Pulse (bpm): 73 Weight (lbs): 185.6 Respiratory Rate (breaths/min): 16 Body Mass Index (BMI): 25.9 Blood Pressure (mmHg): 103/57 Reference Range: 80 - 120 mg / dl Electronic Signature(s) Signed: 07/12/2018 4:36:03 PM By: Lorine Bears RCP, RRT, CHT Entered By: Lorine Bears on 07/12/2018 14:40:46

## 2018-07-19 ENCOUNTER — Encounter: Payer: Medicare Other | Admitting: Occupational Therapy

## 2018-07-19 ENCOUNTER — Ambulatory Visit
Admission: RE | Admit: 2018-07-19 | Discharge: 2018-07-19 | Disposition: A | Discharge status: Home / Self Care | Payer: Medicare Other | Source: Ambulatory Visit | Attending: Oncology | Admitting: Oncology

## 2018-07-19 DIAGNOSIS — C799 Secondary malignant neoplasm of unspecified site: Secondary | ICD-10-CM | POA: Insufficient documentation

## 2018-07-19 DIAGNOSIS — C439 Malignant melanoma of skin, unspecified: Secondary | ICD-10-CM

## 2018-07-19 DIAGNOSIS — M868X8 Other osteomyelitis, other site: Secondary | ICD-10-CM | POA: Insufficient documentation

## 2018-07-19 LAB — GLUCOSE, CAPILLARY: Glucose-Capillary: 84 mg/dL (ref 70–99)

## 2018-07-19 MED ORDER — FLUDEOXYGLUCOSE F - 18 (FDG) INJECTION
9.6000 | Freq: Once | INTRAVENOUS | Status: AC | PRN
  Administered 2018-07-19: 10.37 via INTRAVENOUS

## 2018-07-19 NOTE — Progress Notes (Signed)
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  INTERVAL HISTORY: Patient returns to clinic today for further evaluation, discussion of his imaging results, and continuation of Zelboraf.  He continues to have chronic weakness and fatigue, but otherwise is tolerating his treatments well.   He has no neurologic complaints. He denies any recent fevers or illnesses. He has no chest pain, cough, or hemoptysis. He denies any nausea, vomiting, constipation, or diarrhea. He has no melena or hematochezia. He has no urinary complaints.  Patient offers no further specific complaints today.  REVIEW OF SYSTEMS:   Review of Systems  Constitutional: Positive for malaise/fatigue. Negative for fever and weight loss.  Respiratory: Negative.  Negative for cough and shortness of breath.   Cardiovascular: Negative.  Negative for chest pain and leg swelling.  Gastrointestinal: Negative.  Negative for abdominal pain, blood in stool, melena, nausea and vomiting.  Genitourinary: Negative.  Negative for dysuria.  Musculoskeletal: Negative.  Negative for myalgias.  Skin: Negative.  Negative for rash.  Neurological: Positive for weakness. Negative for sensory change and focal weakness.  Psychiatric/Behavioral: Negative.  The patient is not nervous/anxious.     As per HPI. Otherwise, a complete review of systems is negative.  PAST MEDICAL HISTORY: Past Medical History:  Diagnosis Date  . Anemia   . Arthritis   . Cancer ([place])   . Complication of anesthesia   . PONV (postoperative nausea and vomiting)     PAST SURGICAL HISTORY: Past Surgical History:  Procedure Laterality Date  . AXILLARY LYMPH NODE DISSECTION Left 06/26/2017   Procedure: EXCISION OF LEFT  AXILLARY MASS;  Surgeon: Johnathan Hausen, MD;  Location: [facility] ORS;  Service: General;  Laterality: Left;  . DIAGNOSTIC LAPAROSCOPY    . EMBOLIZATION Left 05/11/2018   Procedure: EMBOLIZATION;  Surgeon: Katha Cabal, MD;  Location: [place] [facility];  Service: Cardiovascular;  Laterality: Left;  . LAPAROTOMY N/A 10/14/2017   Procedure: EXPLORATORY LAPAROTOMY;  Surgeon: Clayburn Pert, MD;  Location: [facility] ORS;  Service: General;  Laterality: N/A;  . VASECTOMY      FAMILY HISTORY: Family History  Problem Relation Age of Onset  . Diabetes Mother   . Hypertension Mother   . Leukemia Father   . Thyroid disease Sister   . Liver disease Maternal Grandmother   . Heart Problems Maternal Grandfather   . Diabetes Maternal Grandfather   . Lung cancer Paternal Grandmother   . Prostate cancer Paternal Grandfather     ADVANCED DIRECTIVES (Y/N):  N  HEALTH MAINTENANCE: Social History   Tobacco Use  . Smoking status: Never Smoker  . Smokeless tobacco: Never Used  Substance Use Topics  . Alcohol use: No  . Drug use: No     Colonoscopy:  PAP:  Bone density:  Lipid panel:  No Known Allergies  Current Outpatient Medications  Medication Sig Dispense Refill  . Multiple Vitamin (MULTIVITAMIN) tablet Take 1 tablet by mouth daily.    . vemurafenib (ZELBORAF) 240 MG tablet Take 3 tablets (720 mg total) by mouth every 12 (twelve) hours. 180 tablet 2  . Oxycodone HCl 10 MG TABS Take 1 tablet (10 mg total) by mouth every 6 (six) hours as needed. (Patient not taking: Reported on 07/21/2018) 60 tablet 0   No current facility-administered medications for  this visit.     OBJECTIVE: Vitals:   07/21/18 1127  BP: 103/63  Pulse: (!) 59  Resp: 18  Temp: (!) 95.2 F (35.1 C)     Body mass index is 23.92 kg/m.    ECOG FS:0 - Asymptomatic  General: Well-developed, well-nourished, no acute distress. Eyes: Pink conjunctiva, anicteric sclera. HEENT: Normocephalic, moist mucous  membranes. Lungs: Clear to auscultation bilaterally. Heart: Regular rate and rhythm. No rubs, murmurs, or gallops. Abdomen: Soft, nontender, nondistended. No organomegaly noted, normoactive bowel sounds. Musculoskeletal: Left arm lymphedema. Neuro: Alert, answering all questions appropriately. Cranial nerves grossly intact. Skin: No rashes or petechiae noted.  Ulcerative lesion on posterior left back. Psych: Normal affect.  LAB RESULTS:  Lab Results  Component Value Date   NA 140 07/21/2018   K 3.9 07/21/2018   CL 107 07/21/2018   CO2 23 07/21/2018   GLUCOSE 90 07/21/2018   BUN 15 07/21/2018   CREATININE 1.01 07/21/2018   CALCIUM 8.7 (L) 07/21/2018   PROT 6.9 07/21/2018   ALBUMIN 3.6 07/21/2018   AST 14 (L) 07/21/2018   ALT 10 07/21/2018   ALKPHOS 52 07/21/2018   BILITOT 0.7 07/21/2018   GFRNONAA >60 07/21/2018   GFRAA >60 07/21/2018    Lab Results  Component Value Date   WBC 4.6 07/21/2018   NEUTROABS 3.7 07/21/2018   HGB 9.1 (L) 07/21/2018   HCT 28.9 (L) 07/21/2018   MCV 68.7 (L) 07/21/2018   PLT 301 07/21/2018     STUDIES: Nm Pet Image Restage (ps) Whole Body  Result Date: 07/19/2018 CLINICAL DATA:  Subsequent treatment strategy for metastatic melanoma. EXAM: NUCLEAR MEDICINE PET WHOLE BODY TECHNIQUE: 10.37 mCi F-18 FDG was injected intravenously. Full-ring PET imaging was performed from the skull base to thigh after the radiotracer. CT data was obtained and used for attenuation correction and anatomic localization. Fasting blood glucose: 84 mg/dl COMPARISON:  03/04/2018 FINDINGS: Mediastinal blood pool activity: SUV max 84 HEAD/NECK: No hypermetabolic activity in the scalp. No hypermetabolic cervical lymph nodes. Incidental CT findings: none CHEST: Large mass within the left axilla measures 6.7 x 7.5 cm. Centrally this appears necrotic with new small foci of gas. Increased uptake along the margins of this mass has an SUV max of 7.29. Previously this measured 5.7 by  7.5 cm with SUV max of 8.69. Adjacent node measures 4.5 x 3.6 cm with SUV max of 4.09. Previously 3.9 x 3.6 cm with SUV max of 8.55. No hypermetabolic supraclavicular or axillary lymph nodes. The index azygos esophageal recess lymph node measures 8 mm and has an SUV max of 4.58. Previously 10 mm within SUV max of 5.5. SUV max within the right hilum is equal to 3.28. Previously 4.1. Incidental CT findings: Scattered tiny lung nodules on the order of 3-4 mm are similar. No hypermetabolic pulmonary nodules identified. ABDOMEN/PELVIS: No abnormal hypermetabolic activity within the liver, pancreas, adrenal glands, or spleen. No hypermetabolic lymph nodes in the abdomen or pelvis. Incidental CT findings: none SKELETON: Lucent lesion within the left iliac wing measures 2 cm within SUV max of 1.32. Previously 2.3 cm within SUV max of 1.8. Within the upper left posterior chest wall there is a ulcerative are lesion measuring 1.8 cm within SUV max of 3.78. This is new compared with previous exam. Incidental CT findings: none EXTREMITIES: No abnormal hypermetabolic activity in the lower extremities. Incidental CT findings: none IMPRESSION: 1. The dominant left axillary mass appears internally necrotic without central hypermetabolism and now contains foci of gas. Although  the overall size is slightly increased in size when compared with the previous exam the degree of internal uptake has diminished in the interval. Persistent FDG uptake within the margins of this mass is stable to decreased from previous exam. 2. New small ulcerative lesion within the left posterior upper chest wall within SUV max of 3.78. Although this may be inflammatory in etiology, cutaneous metastasis is not excluded and correlation with physical exam findings is advised. 3. Low level FDG uptake associated with 2 cm lucent lesion within the left iliac bone. Electronically Signed   By: Kerby Moors M.D.   On: 07/19/2018 13:41    ASSESSMENT: Metastatic  melanoma to mediastinum and bone, BRAF positive.  PLAN:    1. 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  Left axillary mass appears larger, but has less hypermetabolism.  The ulcerative lesion on the left posterior upper chest wall appears to be folliculitis, but could be a metastatic deposit.  Patient has appointment dermatology in the next week. 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  Continue Zelboraf 720 mg twice per day.  Patient is given a dose reduce his Zelboraf to 2 tabs twice a day until after his daughter's wedding at the end of October.  Return to clinic in 1 month with repeat laboratory work and further evaluation. 2.  Left axilla: PET scan results as above.  Significantly improved in size.  Patient completed XRT on June 04, 2018.  Appreciate vascular surgical input to reduce bleeding.   3.  Left arm swelling: Ultrasound negative for DVT.  Continue follow-up with lymphedema clinic. 4.  Poor appetite: Continue Megace as prescribed.  Appreciate dietary input. 5.  Anemia: Patient's hemoglobin has trended up and is now 9.1.  Monitor. 6.  Left posterior back lesion: Appears to be infectious in origin, but a metastatic deposit is not ruled out.  Patient has an appointment with dermatology in the next week.  Okay to pursue biopsy if necessary.  Patient expressed understanding and was in agreement with this plan. He also understands that He can call clinic at any time with any questions, concerns, or complaints.   Cancer Staging Metastatic melanoma [facility]) Staging form: Melanoma of the Skin, AJCC 8th Edition - Clinical stage from 07/15/2017: Stage IV (cTX, cN1, cM1c(0)) - Signed by Lloyd Huger, MD on 07/15/2017   Lloyd Huger, MD   07/24/2018 7:45 AM

## 2018-07-20 ENCOUNTER — Ambulatory Visit: Payer: Medicare Other | Attending: Oncology | Admitting: Occupational Therapy

## 2018-07-20 DIAGNOSIS — I890 Lymphedema, not elsewhere classified: Secondary | ICD-10-CM | POA: Insufficient documentation

## 2018-07-20 DIAGNOSIS — E8989 Other postprocedural endocrine and metabolic complications and disorders: Secondary | ICD-10-CM | POA: Insufficient documentation

## 2018-07-21 ENCOUNTER — Other Ambulatory Visit: Payer: Self-pay

## 2018-07-21 ENCOUNTER — Ambulatory Visit: Payer: Medicare Other | Admitting: Oncology

## 2018-07-21 ENCOUNTER — Ambulatory Visit
Admission: RE | Admit: 2018-07-21 | Discharge: 2018-07-21 | Disposition: A | Discharge status: Home / Self Care | Payer: Medicare Other | Source: Ambulatory Visit | Attending: Radiation Oncology | Admitting: Radiation Oncology

## 2018-07-21 ENCOUNTER — Encounter: Payer: Self-pay | Admitting: Radiation Oncology

## 2018-07-21 ENCOUNTER — Inpatient Hospital Stay: Payer: Medicare Other | Attending: Oncology

## 2018-07-21 ENCOUNTER — Other Ambulatory Visit: Payer: Medicare Other

## 2018-07-21 ENCOUNTER — Inpatient Hospital Stay (HOSPITAL_BASED_OUTPATIENT_CLINIC_OR_DEPARTMENT_OTHER): Payer: Medicare Other | Admitting: Oncology

## 2018-07-21 VITALS — BP 103/63 | HR 59 | Temp 95.2°F | Resp 18 | Wt 171.5 lb

## 2018-07-21 DIAGNOSIS — C799 Secondary malignant neoplasm of unspecified site: Secondary | ICD-10-CM

## 2018-07-21 DIAGNOSIS — Z923 Personal history of irradiation: Secondary | ICD-10-CM

## 2018-07-21 DIAGNOSIS — Z1509 Genetic susceptibility to other malignant neoplasm: Secondary | ICD-10-CM | POA: Diagnosis not present

## 2018-07-21 DIAGNOSIS — Z801 Family history of malignant neoplasm of trachea, bronchus and lung: Secondary | ICD-10-CM | POA: Insufficient documentation

## 2018-07-21 DIAGNOSIS — Z79899 Other long term (current) drug therapy: Secondary | ICD-10-CM | POA: Insufficient documentation

## 2018-07-21 DIAGNOSIS — R630 Anorexia: Secondary | ICD-10-CM | POA: Diagnosis not present

## 2018-07-21 DIAGNOSIS — C439 Malignant melanoma of skin, unspecified: Secondary | ICD-10-CM | POA: Insufficient documentation

## 2018-07-21 DIAGNOSIS — R531 Weakness: Secondary | ICD-10-CM | POA: Diagnosis not present

## 2018-07-21 DIAGNOSIS — Z806 Family history of leukemia: Secondary | ICD-10-CM

## 2018-07-21 DIAGNOSIS — C7951 Secondary malignant neoplasm of bone: Secondary | ICD-10-CM | POA: Insufficient documentation

## 2018-07-21 DIAGNOSIS — M129 Arthropathy, unspecified: Secondary | ICD-10-CM | POA: Insufficient documentation

## 2018-07-21 DIAGNOSIS — C781 Secondary malignant neoplasm of mediastinum: Secondary | ICD-10-CM

## 2018-07-21 DIAGNOSIS — R918 Other nonspecific abnormal finding of lung field: Secondary | ICD-10-CM | POA: Insufficient documentation

## 2018-07-21 DIAGNOSIS — R5383 Other fatigue: Secondary | ICD-10-CM

## 2018-07-21 DIAGNOSIS — D649 Anemia, unspecified: Secondary | ICD-10-CM

## 2018-07-21 LAB — CBC WITH DIFFERENTIAL/PLATELET
BASOS PCT: 0 %
Basophils Absolute: 0 10*3/uL (ref 0–0.1)
EOS PCT: 1 %
Eosinophils Absolute: 0.1 10*3/uL (ref 0–0.7)
HEMATOCRIT: 28.9 % — AB (ref 40.0–52.0)
Hemoglobin: 9.1 g/dL — ABNORMAL LOW (ref 13.0–18.0)
Lymphocytes Relative: 10 %
Lymphs Abs: 0.5 10*3/uL — ABNORMAL LOW (ref 1.0–3.6)
MCH: 21.6 pg — ABNORMAL LOW (ref 26.0–34.0)
MCHC: 31.5 g/dL — ABNORMAL LOW (ref 32.0–36.0)
MCV: 68.7 fL — ABNORMAL LOW (ref 80.0–100.0)
MONO ABS: 0.4 10*3/uL (ref 0.2–1.0)
Monocytes Relative: 8 %
NEUTROS ABS: 3.7 10*3/uL (ref 1.4–6.5)
Neutrophils Relative %: 81 %
PLATELETS: 301 10*3/uL (ref 150–440)
RBC: 4.21 MIL/uL — ABNORMAL LOW (ref 4.40–5.90)
RDW: 18 % — AB (ref 11.5–14.5)
WBC: 4.6 10*3/uL (ref 3.8–10.6)

## 2018-07-21 LAB — COMPREHENSIVE METABOLIC PANEL
ALBUMIN: 3.6 g/dL (ref 3.5–5.0)
ALT: 10 U/L (ref 0–44)
ANION GAP: 10 (ref 5–15)
AST: 14 U/L — ABNORMAL LOW (ref 15–41)
Alkaline Phosphatase: 52 U/L (ref 38–126)
BILIRUBIN TOTAL: 0.7 mg/dL (ref 0.3–1.2)
BUN: 15 mg/dL (ref 8–23)
CO2: 23 mmol/L (ref 22–32)
Calcium: 8.7 mg/dL — ABNORMAL LOW (ref 8.9–10.3)
Chloride: 107 mmol/L (ref 98–111)
Creatinine, Ser: 1.01 mg/dL (ref 0.61–1.24)
GFR calc Af Amer: >60 mL/min (ref >60)
GLUCOSE: 90 mg/dL (ref 70–99)
POTASSIUM: 3.9 mmol/L (ref 3.5–5.1)
Sodium: 140 mmol/L (ref 135–145)
TOTAL PROTEIN: 6.9 g/dL (ref 6.5–8.1)

## 2018-07-21 NOTE — Progress Notes (Signed)
Here for follow up. Not as much stamina -but feeling better. Radiation treatments are over.

## 2018-07-21 NOTE — Progress Notes (Signed)
Radiation Oncology Follow up Note  Name: Louis Davies   Date:   07/21/2018 MRN:  [id number] DOB: 1950/12/15    This 67 y.o. male presents to the clinic today for one-month follow-up status post palliative radiation therapy to his left axilla for metastatic malignant melanoma's.  REFERRING PROVIDER: [facility], [facility]  HPI: patient is a67 year old male now out 1 month having completed palliative radiation therapy to his left axilla for involvement of stage IV malignant melanoma. He is doing well still has some drainage from that site. Recent PET CT scan shows almost complete necrosis of that lesion indicative of excellent response..he does have a small ulcerative lesion left posterior upper chest wall which may be inflammatory. There is also a 2 cm lucent lesion the left iliac bone although is having no pain in that area.  COMPLICATIONS OF TREATMENT: none  FOLLOW UP COMPLIANCE: keeps appointments   PHYSICAL EXAM:  BP (P) 108/66 (BP Location: Left Arm, Patient Position: Sitting)   Pulse (!) (P) 59   Temp (!) (P) 95.5 F (35.3 C) (Tympanic)   Resp (P) 18   Wt (P) 171 lb 6.5 oz (77.7 kg)   BMI (P) 23.91 kg/m  Left axilla was bandaged although markedly reduced size of the left axillary mass. He is wearing a lymphedema sleeve on his left upper extremity.Well-developed well-nourished patient in NAD. HEENT reveals PERLA, EOMI, discs not visualized.  Oral cavity is clear. No oral mucosal lesions are identified. Neck is clear without evidence of cervical or supraclavicular adenopathy. Lungs are clear to A&P. Cardiac examination is essentially unremarkable with regular rate and rhythm without murmur rub or thrill. Abdomen is benign with no organomegaly or masses noted. Motor sensory and DTR levels are equal and symmetric in the upper and lower extremities. Cranial nerves II through XII are grossly intact. Proprioception is intact. No peripheral adenopathy or edema is identified. No motor or  sensory levels are noted. Crude visual fields are within normal range.  RADIOLOGY RESULTS: PET CT scan reviewed  PLAN: the present time patient is an excellent palliative benefit from radiation to his left axilla. I'm turning follow-up care over to medical oncology.he is currently onZelboraf which she is tolerating well. I be happy to reevaluate the patient any time should further palliative treatment be indicated.  I would like to take this opportunity to thank you for allowing me to participate in the care of your patient.Noreene Filbert, MD

## 2018-07-21 NOTE — Therapy (Signed)
[place] [facility]IN [facility] [address] [place], [place], [address] Phone: [phone]   Fax:  [phone]  Occupational Therapy Treatment  Patient Details  Name: Louis Davies MRN: [id number] Date of Birth: [birth year] No data recorded  Encounter Date: 07/20/2018  OT End of Session - 07/21/18 0919    Visit Number  7    Number of Visits  36    Date for OT Re-Evaluation  09/27/18    OT Start Time  0202    OT Stop Time  0330    OT Time Calculation (min)  88 min    Activity Tolerance  Patient tolerated treatment well;Patient limited by fatigue;No increased pain    Behavior During Therapy  WFL for tasks assessed/performed       Past Medical History:  Diagnosis Date  . Anemia   . Arthritis   . Cancer ([place])   . Complication of anesthesia   . PONV (postoperative nausea and vomiting)     Past Surgical History:  Procedure Laterality Date  . AXILLARY LYMPH NODE DISSECTION Left 06/26/2017   Procedure: EXCISION OF LEFT AXILLARY MASS;  Surgeon: Johnathan Hausen, MD;  Location: [facility] ORS;  Service: General;  Laterality: Left;  . DIAGNOSTIC LAPAROSCOPY    . EMBOLIZATION Left 05/11/2018   Procedure: EMBOLIZATION;  Surgeon: Katha Cabal, MD;  Location: [place] [facility];  Service: Cardiovascular;  Laterality: Left;  . LAPAROTOMY N/A 10/14/2017   Procedure: EXPLORATORY LAPAROTOMY;  Surgeon: Clayburn Pert, MD;  Location: [facility] ORS;  Service: General;  Laterality: N/A;  . VASECTOMY      There were no vitals filed for this visit.  Subjective Assessment - 07/21/18 0913    Subjective   Pt presents for OT visit 7/36 for Complete Decongestive Therapy (CDT) for melanoma-related  LUE/LUq lymphedema (LE) . Pt presents with loaned, OTS, class 2 compression sleeve in place, which he is using during work hours. Pt states, "I'm wrapping at night and using the sleeve in the daytime. It's so hard to get comfortable to sleep."    Currently in Pain?  Yes    pain in arm and axilla unchanged. Not rated numerically today.                  OT Treatments/Exercises (OP) - 07/21/18 0001      ADLs   ADL Education Given  Yes      Manual Therapy   Manual Therapy  Edema management;Soft tissue mobilization;Myofascial release;Manual Lymphatic Drainage (MLD);Compression Bandaging    Manual therapy comments  skin care to L volar arm using castor oil during manual theyrapy to hydrate and  mobilize skin    Myofascial Release  very gentle myofacial release to distal LUE in effort to release cording in axilla.     Manual Lymphatic Drainage (MLD)  MLD to LUE/ LUQ in supine and R sidelying utilizing short neck sequence, intercostal pathways, posterior and anterior axillary anastomoses, and utilizing L axillary- inginal anastomosis. Increased limb volume noted today reduced w/ MLD     Compression Bandaging  Multilayer gradient compression wraps to LUE using short stretch wraps over Rosidol foal. Initial layer of cotton stockinett applied to L arm and hand . Lymphedema hand bandaging in standard configuration using elastic guaze to digits 1-5. Cotton stockinett applied from base of fingers ( and folded back at MPs) extending to proximal arm at border of axilla. Foal over stockinett w/ lite tension and ~ 50% overalp ; 6 cm shrort  atretch wrap to hand starting at MPs and using finger of 8over hand x 2, then extending over wrist w/ light tension and 50% overlap. Next, 8 cm wrap applied at wrist and extending proximally w/ 50% overlap. Finally. 10 cm wrap anchored at wrist again and extending proximally w/ 50% overlap. Stretch net applied over entire wrap system to assist w/ keeping all in place.             OT Education - 07/21/18 0916    Education Details  Pt edu re compression garment and device options, and recommendations. Utilized samples and Market researcher.    Person(s) Educated  Patient    Methods  Explanation;Demonstration;Handout;Tactile  cues;Verbal cues    Comprehension  Verbalized understanding;Need further instruction;Returned demonstration;Verbal cues required;Tactile cues required          OT Long Term Goals - 06/30/18 1323      OT LONG TERM GOAL #1   Title  Pt will be able to apply multi-layer short stretch compression wraps using correct gradient techniques with independently , but with extra time  to achieve optimal limb volume reduction during Intensive Phase CDT, and to return affected limb(s) , as closely as possible, to premorbid size and shape.    Baseline  Dependent    Time  2    Period  Weeks    Status  New    Target Date  --   10th OT Rx visit     OT LONG TERM GOAL #2   Title  Pt will demonstrate understanding of lymphedema (LE) precautions / prevention principals, including signs / symptoms of cellulitis infection with modified independence using LE Workbook as printed reference to identify 6 precautions without verbal cues by end of 3rd  OT Rx visit.     Baseline  no knowledge    Time  3    Period  Days    Status  New    Target Date  --   3rd OT Rx visit     OT LONG TERM GOAL #3   Title  Pt to achieve no less than 10% limb volume reduction in affected limb(s) during Intensive Phase CDT to control limb swelling, to improve tissue integrity and immune function, to improve ADLs performance and to improve functional mobility/ transfers.    Baseline  dependent    Time  12    Period  Weeks    Status  New    Target Date  09/27/18      OT LONG TERM GOAL #4   Title  Pt will achieve 100% compliance with daily LE self-care home program components, including proper skin care, simple self-MLD, gradient compression wraps/ garments, and therapeutic exercise to ensure optimal Intensive Phase limb volume reduction to expedite compression garment/ device fitting.    Baseline  Max A    Time  12    Period  Weeks    Status  New    Target Date  09/27/18      OT LONG TERM GOAL #5   Title  Pt will demonstrate  competent use of assistive devices during LE self-care training to improve ability to don and doff compression garments/devices with modified assistance to ensure optimal LE self-management over time.    Baseline  Max A    Time  12    Status  New    Target Date  09/27/18      Long Term Additional Goals   Additional Long Term Goals  Yes  OT LONG TERM GOAL #6   Title  Pt will retain optimal limb volume reductions achieved during Intensive Phase CDT with no more than 3% volume increase  without CG assistance to limit LE progression and further functional decline.    Baseline  Dependent    Time  6    Period  Months    Status  New    Target Date  12/27/18            Plan - 07/21/18 0920    Clinical Impression Statement  Pt tolerated manual therapies and compression wraps without difficulty. We reviewed compression garment and device options and discussed specific recommendations for both work day and down time day garments, as well as devices for HOS designed to limit fibrosis formatino during HOS. Cont as per POC. Complete custom and OTS garment measurements next visit.    Occupational performance deficits (Please refer to evaluation for details):  ADL's;Leisure;Social Participation;IADL's;Rest and Sleep;Work;Other   body image   Rehab Potential  Good    OT Frequency  3x / week    OT Duration  Other (comment)   and PRN   OT Treatment/Interventions  Self-care/ADL training;Therapeutic exercise;Manual lymph drainage;Compression bandaging;Patient/family education;Other (comment);Energy conservation;Therapeutic activities;Balance training;DME and/or AE instruction;Manual Therapy;Passive range of motion    Clinical Decision Making  Multiple treatment options, significant modification of task necessary    Recommended Other Services  fit with custom, clat knit LUE compression sleeve and HOS device. Consider seamless compression glove and vest. Consider kinesiotape during Intensive Phase     Consulted and Agree with Plan of Care  Patient       Patient will benefit from skilled therapeutic intervention in order to improve the following deficits and impairments:  Decreased endurance, Decreased mobility, Decreased skin integrity, Impaired sensation, Decreased knowledge of precautions, Decreased range of motion, Decreased scar mobility, Increased edema, Pain, Decreased activity tolerance, Decreased knowledge of use of DME, Decreased strength, Impaired flexibility, Impaired UE functional use, Decreased psychosocial skills, Decreased balance, Other (comment)(balance and gait negatively impacted by guarding  posture , impaired armswing and body asymmetry)  Visit Diagnosis: Lymphedema of upper extremity following lymphadenectomy    Problem List Patient Active Problem List   Diagnosis Date Noted  . Anemia 05/10/2018  . Perforated viscus   . Ileus ([place])   . Constipation 10/10/2017  . Metastatic melanoma ([place]) 07/07/2017  . Axillary mass, left 06/26/2017  . Chronic pain of left knee 12/14/2015  . Influenza vaccination declined 11/27/2015    Andrey Spearman, MS, OTR/L, [facility] 07/21/18 9:23 AM   [place] MA[facility] [address] [place], [place], [address] Phone: [phone]   Fax:  [phone]  Name: Louis Davies MRN: [id number] Date of Birth: [birth year]

## 2018-07-22 ENCOUNTER — Ambulatory Visit: Payer: Medicare Other | Admitting: Occupational Therapy

## 2018-07-22 DIAGNOSIS — E8989 Other postprocedural endocrine and metabolic complications and disorders: Secondary | ICD-10-CM | POA: Diagnosis not present

## 2018-07-22 DIAGNOSIS — I890 Lymphedema, not elsewhere classified: Secondary | ICD-10-CM

## 2018-07-22 LAB — THYROID PANEL WITH TSH
FREE THYROXINE INDEX: 1.6 (ref 1.2–4.9)
T3 Uptake Ratio: 30 % (ref 24–39)
T4, Total: 5.2 ug/dL (ref 4.5–12.0)
TSH: 3.47 u[IU]/mL (ref 0.450–4.500)

## 2018-07-22 NOTE — Therapy (Signed)
[place] MAIN [facility] SERVICES [address] [place], [place], [address] Phone: [phone]   Fax:  [phone]  Occupational Therapy Treatment  Patient Details  Name: Louis Davies MRN: [id number] Date of Birth: Feb 19, 1951 No data recorded  Encounter Date: 07/22/2018  OT End of Session - 07/22/18 1604    Visit Number  8    Number of Visits  36    Date for OT Re-Evaluation  09/27/18    OT Start Time  0208    OT Stop Time  0308    OT Time Calculation (min)  60 min    Activity Tolerance  Patient tolerated treatment well;Patient limited by fatigue;No increased pain    Behavior During Therapy  WFL for tasks assessed/performed       Past Medical History:  Diagnosis Date  . Anemia   . Arthritis   . Cancer ([place])   . Complication of anesthesia   . PONV (postoperative nausea and vomiting)     Past Surgical History:  Procedure Laterality Date  . AXILLARY LYMPH NODE DISSECTION Left 06/26/2017   Procedure: EXCISION OF LEFT AXILLARY MASS;  Surgeon: Johnathan Hausen, MD;  Location: [facility] ORS;  Service: General;  Laterality: Left;  . DIAGNOSTIC LAPAROSCOPY    . EMBOLIZATION Left 05/11/2018   Procedure: EMBOLIZATION;  Surgeon: Katha Cabal, MD;  Location: [facility];  Service: Cardiovascular;  Laterality: Left;  . LAPAROTOMY N/A 10/14/2017   Procedure: EXPLORATORY LAPAROTOMY;  Surgeon: Clayburn Pert, MD;  Location: [facility] ORS;  Service: General;  Laterality: N/A;  . VASECTOMY      There were no vitals filed for this visit.  Subjective Assessment - 07/22/18 1604    Subjective   Pt presents for OT visit 8/36 for Complete Decongestive Therapy (CDT) for melanoma-related  LUE/LUq lymphedema (LE) . Pt presents with loaned, OTS, class 2 compression sleeve in place, which he icontinues to use during work hours. OTS is present throughout session. Pt reports, "I'm wrapping at night and using this sleeve during the day. I think this is working.  My hand hasnt been swelling as much. " Pt also reports he had positive visit with oncology MD earlier in the week.                   OT Treatments/Exercises (OP) - 07/22/18 0001      ADLs   ADL Education Given  Yes      Manual Therapy   Manual Therapy  Edema management    Edema Management  competed anatomical measurements for custom compression arm sleeve, glove and HOS device. Faxed to vendor after session.             OT Education - 07/22/18 1607    Education Details  Pt participated in non-clinical garment and device choices with guidance from therapist.     Person(s) Educated  Patient    Methods  Explanation;Demonstration;Handout;Tactile cues;Verbal cues    Comprehension  Verbalized understanding;Need further instruction;Returned demonstration;Verbal cues required;Tactile cues required          OT Long Term Goals - 06/30/18 1323      OT LONG TERM GOAL #1   Title  Pt will be able to apply multi-layer short stretch compression wraps using correct gradient techniques with independently , but with extra time  to achieve optimal limb volume reduction during Intensive Phase CDT, and to return affected limb(s) , as closely as possible, to premorbid size and shape.  Baseline  Dependent    Time  2    Period  Weeks    Status  New    Target Date  --   10th OT Rx visit     OT LONG TERM GOAL #2   Title  Pt will demonstrate understanding of lymphedema (LE) precautions / prevention principals, including signs / symptoms of cellulitis infection with modified independence using LE Workbook as printed reference to identify 6 precautions without verbal cues by end of 3rd  OT Rx visit.     Baseline  no knowledge    Time  3    Period  Days    Status  New    Target Date  --   3rd OT Rx visit     OT LONG TERM GOAL #3   Title  Pt to achieve no less than 10% limb volume reduction in affected limb(s) during Intensive Phase CDT to control limb swelling, to improve tissue  integrity and immune function, to improve ADLs performance and to improve functional mobility/ transfers.    Baseline  dependent    Time  12    Period  Weeks    Status  New    Target Date  09/27/18      OT LONG TERM GOAL #4   Title  Pt will achieve 100% compliance with daily LE self-care home program components, including proper skin care, simple self-MLD, gradient compression wraps/ garments, and therapeutic exercise to ensure optimal Intensive Phase limb volume reduction to expedite compression garment/ device fitting.    Baseline  Max A    Time  12    Period  Weeks    Status  New    Target Date  09/27/18      OT LONG TERM GOAL #5   Title  Pt will demonstrate competent use of assistive devices during LE self-care training to improve ability to don and doff compression garments/devices with modified assistance to ensure optimal LE self-management over time.    Baseline  Max A    Time  12    Status  New    Target Date  09/27/18      Long Term Additional Goals   Additional Long Term Goals  Yes      OT LONG TERM GOAL #6   Title  Pt will retain optimal limb volume reductions achieved during Intensive Phase CDT with no more than 3% volume increase  without CG assistance to limit LE progression and further functional decline.    Baseline  Dependent    Time  6    Period  Months    Status  New    Target Date  12/27/18            Plan - 07/22/18 1609    Clinical Impression Statement  Completed anatomical measurements for custom, flat knit, Elvarex SOFT, ccl 2 ( 30-40 mmHg) arms sleeve and ccl 2 Elvarex SOFT Seamless glove. This flat knit fabric is selected as a dual duty garment for work and weekend down time that is easier to don and doff. This fabric is softer where it contacts tender skin in [place], and we were able to request a 3/4 silicone bamnd. Glove is medically necessary to limit hand swelling at distal origin of gradienrt . Also recommend convoluted Elvarex RELAX device for  HOS to facilitate improved lymphatic function at night when fluid and fibrosis collect in issues resulting in increased faty fibrosis deposition. Cont as Per POC. No wraps after  session. Pt agrees to apply wraps at home.    Occupational performance deficits (Please refer to evaluation for details):  ADL's;Leisure;Social Participation;IADL's;Rest and Sleep;Work;Other   body image   Rehab Potential  Good    OT Frequency  3x / week    OT Duration  Other (comment)   and PRN   OT Treatment/Interventions  Self-care/ADL training;Therapeutic exercise;Manual lymph drainage;Compression bandaging;Patient/family education;Other (comment);Energy conservation;Therapeutic activities;Balance training;DME and/or AE instruction;Manual Therapy;Passive range of motion    Clinical Decision Making  Multiple treatment options, significant modification of task necessary    Recommended Other Services  fit with custom, clat knit LUE compression sleeve and HOS device. Consider seamless compression glove and vest. Consider kinesiotape during Intensive Phase    Consulted and Agree with Plan of Care  Patient       Patient will benefit from skilled therapeutic intervention in order to improve the following deficits and impairments:  Decreased endurance, Decreased mobility, Decreased skin integrity, Impaired sensation, Decreased knowledge of precautions, Decreased range of motion, Decreased scar mobility, Increased edema, Pain, Decreased activity tolerance, Decreased knowledge of use of DME, Decreased strength, Impaired flexibility, Impaired UE functional use, Decreased psychosocial skills, Decreased balance, Other (comment)(balance and gait negatively impacted by guarding  posture , impaired armswing and body asymmetry)  Visit Diagnosis: Lymphedema of upper extremity following lymphadenectomy    Problem List Patient Active Problem List   Diagnosis Date Noted  . Anemia 05/10/2018  . Perforated viscus   . Ileus ([place])   .  Constipation 10/10/2017  . Metastatic melanoma ([place]) 07/07/2017  . Axillary mass, left 06/26/2017  . Chronic pain of left knee 12/14/2015  . Influenza vaccination declined 11/27/2015    Andrey Spearman, MS, OTR/L, [facility] 07/22/18 4:14 PM  [place] MAIN [facility] SERVICES [address] [place], [place], [address] Phone: [phone]   Fax:  [phone]  Name: Louis Davies MRN: [id number] Date of Birth: 20-Mar-1951

## 2018-07-26 ENCOUNTER — Ambulatory Visit: Payer: Medicare Other | Admitting: Physician Assistant

## 2018-07-26 ENCOUNTER — Ambulatory Visit: Payer: Medicare Other | Admitting: Occupational Therapy

## 2018-07-26 DIAGNOSIS — L02212 Cutaneous abscess of back [any part, except buttock]: Secondary | ICD-10-CM | POA: Diagnosis not present

## 2018-07-26 DIAGNOSIS — Z85828 Personal history of other malignant neoplasm of skin: Secondary | ICD-10-CM | POA: Diagnosis not present

## 2018-07-26 DIAGNOSIS — D485 Neoplasm of uncertain behavior of skin: Secondary | ICD-10-CM | POA: Diagnosis not present

## 2018-07-26 DIAGNOSIS — L720 Epidermal cyst: Secondary | ICD-10-CM | POA: Diagnosis not present

## 2018-07-27 ENCOUNTER — Ambulatory Visit: Payer: Medicare Other | Admitting: Occupational Therapy

## 2018-07-27 DIAGNOSIS — I890 Lymphedema, not elsewhere classified: Secondary | ICD-10-CM | POA: Diagnosis not present

## 2018-07-27 DIAGNOSIS — E8989 Other postprocedural endocrine and metabolic complications and disorders: Secondary | ICD-10-CM | POA: Diagnosis not present

## 2018-07-27 NOTE — Therapy (Signed)
[place] MAIN [facility] SERVICES [address] [place], [place], [address] Phone: [phone]   Fax:  [phone]  Occupational Therapy Treatment  Patient Details  Name: Louis Davies MRN: [id number] Date of Birth: 1950-12-21 No data recorded  Encounter Date: 07/27/2018  OT End of Session - 07/27/18 1625    Visit Number  9    Number of Visits  36    Date for OT Re-Evaluation  09/27/18    OT Start Time  0205    OT Stop Time  0305    OT Time Calculation (min)  60 min    Activity Tolerance  Patient tolerated treatment well;Patient limited by fatigue;No increased pain    Behavior During Therapy  WFL for tasks assessed/performed       Past Medical History:  Diagnosis Date  . Anemia   . Arthritis   . Cancer ([place])   . Complication of anesthesia   . PONV (postoperative nausea and vomiting)     Past Surgical History:  Procedure Laterality Date  . AXILLARY LYMPH NODE DISSECTION Left 06/26/2017   Procedure: EXCISION OF LEFT AXILLARY MASS;  Surgeon: Johnathan Hausen, MD;  Location: [facility] ORS;  Service: General;  Laterality: Left;  . DIAGNOSTIC LAPAROSCOPY    . EMBOLIZATION Left 05/11/2018   Procedure: EMBOLIZATION;  Surgeon: Katha Cabal, MD;  Location: [facility];  Service: Cardiovascular;  Laterality: Left;  . LAPAROTOMY N/A 10/14/2017   Procedure: EXPLORATORY LAPAROTOMY;  Surgeon: Clayburn Pert, MD;  Location: [facility] ORS;  Service: General;  Laterality: N/A;  . VASECTOMY      There were no vitals filed for this visit.  Subjective Assessment - 07/27/18 1617    Subjective   Pt presents for OT visit 9/36 for Complete Decongestive Therapy (CDT) for melanoma-related  LUE/LUq lymphedema (LE) . Pt presents with loaned, OTS, class 2 compression sleeve in place on LUE. 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     Currently in Pain?  Yes     Pain Score  --   not rated numerically   Pain Location  Arm    Pain Orientation  Left    Pain Descriptors / Indicators  Heaviness;Discomfort;Tightness;Pressure;Guarding;Other (Comment)   fullness                  OT Treatments/Exercises (OP) - 07/27/18 0001      ADLs   ADL Education Given  Yes      Manual Therapy   Manual Therapy  Edema management;Manual Lymphatic Drainage (MLD);Compression Bandaging;Other (comment)   Flexibilityb stretching B shoulders and scapulae   Manual Lymphatic Drainage (MLD)  MLD to LUE/ LUQ in supine and R sidelying utilizing short neck sequence, intercostal pathways, posterior and anterior axillary anastomoses, and utilizing L axillary- inginal anastomosis. Increased limb volume noted today reduced w/ MLD     Compression Bandaging  No wraps in clinic after MLD. Pt will apply at home by request    Other Manual Therapy  Pt donned sleeve independently             OT Education - 07/27/18 1623    Education Details  Pt edu for flexibility stretching in effort to neutralize guarding posture of LUE and shoulder. Taught door way and corner stretches for shoulder retraction, depression, and scapular strengthening. 1 set of 10 , 2 x daily, 30 second hold time  Person(s) Educated  Patient    Methods  Explanation;Demonstration;Handout;Tactile cues;Verbal cues    Comprehension  Verbalized understanding;Need further instruction;Returned demonstration;Verbal cues required;Tactile cues required          OT Long Term Goals - 06/30/18 1323      OT LONG TERM GOAL #1   Title  Pt will be able to apply multi-layer short stretch compression wraps using correct gradient techniques with independently , but with extra time  to achieve optimal limb volume reduction during Intensive Phase CDT, and to return affected limb(s) , as closely as possible, to premorbid size and shape.    Baseline  Dependent    Time  2    Period  Weeks    Status  New    Target  Date  --   10th OT Rx visit     OT LONG TERM GOAL #2   Title  Pt will demonstrate understanding of lymphedema (LE) precautions / prevention principals, including signs / symptoms of cellulitis infection with modified independence using LE Workbook as printed reference to identify 6 precautions without verbal cues by end of 3rd  OT Rx visit.     Baseline  no knowledge    Time  3    Period  Days    Status  New    Target Date  --   3rd OT Rx visit     OT LONG TERM GOAL #3   Title  Pt to achieve no less than 10% limb volume reduction in affected limb(s) during Intensive Phase CDT to control limb swelling, to improve tissue integrity and immune function, to improve ADLs performance and to improve functional mobility/ transfers.    Baseline  dependent    Time  12    Period  Weeks    Status  New    Target Date  09/27/18      OT LONG TERM GOAL #4   Title  Pt will achieve 100% compliance with daily LE self-care home program components, including proper skin care, simple self-MLD, gradient compression wraps/ garments, and therapeutic exercise to ensure optimal Intensive Phase limb volume reduction to expedite compression garment/ device fitting.    Baseline  Max A    Time  12    Period  Weeks    Status  New    Target Date  09/27/18      OT LONG TERM GOAL #5   Title  Pt will demonstrate competent use of assistive devices during LE self-care training to improve ability to don and doff compression garments/devices with modified assistance to ensure optimal LE self-management over time.    Baseline  Max A    Time  12    Status  New    Target Date  09/27/18      Long Term Additional Goals   Additional Long Term Goals  Yes      OT LONG TERM GOAL #6   Title  Pt will retain optimal limb volume reductions achieved during Intensive Phase CDT with no more than 3% volume increase  without CG assistance to limit LE progression and further functional decline.    Baseline  Dependent    Time  6     Period  Months    Status  New    Target Date  12/27/18            Plan - 07/27/18 1625    Clinical Impression Statement  Provided LUE/ LUQ MLD in supine and sideline utilizing short  neck sequence, deep abdominals, anterior and posterior  interaxillary pathways, and ipsilateral axillo-inguinal pathway. Pt tolerated without difficulty. Provided Pt edu re flexibility stretching to bring awareness to guarding posture and to improve AROM. Pt able to perform corner stretch  w/ scapular retraction and doorway stretcjh to L shoulder girdle after skilled instruction and demonstration. Pt instructe to work on reducing guardingshouolder posture, especially when walking, and to try to iresume normal armswing patterns to limit stiffness. Cont as per POC.    Occupational performance deficits (Please refer to evaluation for details):  ADL's;Leisure;Social Participation;IADL's;Rest and Sleep;Work;Other   body image   Rehab Potential  Good    OT Frequency  3x / week    OT Duration  Other (comment)   and PRN   OT Treatment/Interventions  Self-care/ADL training;Therapeutic exercise;Manual lymph drainage;Compression bandaging;Patient/family education;Other (comment);Energy conservation;Therapeutic activities;Balance training;DME and/or AE instruction;Manual Therapy;Passive range of motion    Clinical Decision Making  Multiple treatment options, significant modification of task necessary    Recommended Other Services  fit with custom, clat knit LUE compression sleeve and HOS device. Consider seamless compression glove and vest. Consider kinesiotape during Intensive Phase    Consulted and Agree with Plan of Care  Patient       Patient will benefit from skilled therapeutic intervention in order to improve the following deficits and impairments:  Decreased endurance, Decreased mobility, Decreased skin integrity, Impaired sensation, Decreased knowledge of precautions, Decreased range of motion, Decreased scar  mobility, Increased edema, Pain, Decreased activity tolerance, Decreased knowledge of use of DME, Decreased strength, Impaired flexibility, Impaired UE functional use, Decreased psychosocial skills, Decreased balance, Other (comment)(balance and gait negatively impacted by guarding  posture , impaired armswing and body asymmetry)  Visit Diagnosis: Lymphedema of extremity    Problem List Patient Active Problem List   Diagnosis Date Noted  . Anemia 05/10/2018  . Perforated viscus   . Ileus ([place])   . Constipation 10/10/2017  . Metastatic melanoma ([place]) 07/07/2017  . Axillary mass, left 06/26/2017  . Chronic pain of left knee 12/14/2015  . Influenza vaccination declined 11/27/2015    Andrey Spearman, MS, OTR/L, [facility] 07/27/18 4:37 PM    [place] MAIN [facility] SERVICES [address] [place], [place], [address] Phone: [phone]   Fax:  [phone]  Name: Louis Davies MRN: [id number] Date of Birth: 1951-05-12

## 2018-07-29 ENCOUNTER — Ambulatory Visit: Payer: Medicare Other | Admitting: Occupational Therapy

## 2018-07-29 DIAGNOSIS — I890 Lymphedema, not elsewhere classified: Secondary | ICD-10-CM

## 2018-07-29 DIAGNOSIS — E8989 Other postprocedural endocrine and metabolic complications and disorders: Secondary | ICD-10-CM | POA: Diagnosis not present

## 2018-07-29 NOTE — Therapy (Signed)
[place] MAIN [facility] SERVICES [address] [place], [place], [address] Phone: [phone]   Fax:  [phone]  Occupational Therapy Treatment Note and Progress Report: Lymphedema Care  Patient Details  Name: Louis Davies MRN: [id number] Date of Birth: [birth year] No data recorded  Encounter Date: 07/29/2018  OT End of Session - 07/29/18 1633    Visit Number  10    Number of Visits  36    Date for OT Re-Evaluation  09/27/18    OT Start Time  0210    OT Stop Time  0310    OT Time Calculation (min)  60 min    Activity Tolerance  Patient tolerated treatment well;Patient limited by fatigue;No increased pain    Behavior During Therapy  WFL for tasks assessed/performed       Past Medical History:  Diagnosis Date  . Anemia   . Arthritis   . Cancer ([place])   . Complication of anesthesia   . PONV (postoperative nausea and vomiting)     Past Surgical History:  Procedure Laterality Date  . AXILLARY LYMPH NODE DISSECTION Left 06/26/2017   Procedure: EXCISION OF LEFT AXILLARY MASS;  Surgeon: Johnathan Hausen, MD;  Location: [facility] ORS;  Service: General;  Laterality: Left;  . DIAGNOSTIC LAPAROSCOPY    . EMBOLIZATION Left 05/11/2018   Procedure: EMBOLIZATION;  Surgeon: Katha Cabal, MD;  Location: [place] CV LAB;  Service: Cardiovascular;  Laterality: Left;  . LAPAROTOMY N/A 10/14/2017   Procedure: EXPLORATORY LAPAROTOMY;  Surgeon: Clayburn Pert, MD;  Location: [facility] ORS;  Service: General;  Laterality: N/A;  . VASECTOMY      There were no vitals filed for this visit.  Subjective Assessment - 07/29/18 1625    Subjective   Pt presents for OT visit 10/36 for Complete Decongestive Therapy (CDT) for melanoma-related  LUE/LUq lymphedema (LE) . Pt presents with loaned, OTS, class 2 compression sleeve in place on LUE. Pt reports Dermatology test results for axuillary wound are + for staff infection. Pt reports he starts new antibiotic today.   axilla.     Currently in Pain?  Yes   not rated numericall7y.   Pain Location  Arm    Pain Orientation  Left    Pain Descriptors / Indicators  Heaviness;Discomfort;Tightness;Guarding;Tiring    Pain Type  Chronic pain    Pain Onset  --   post surgical   Pain Frequency  Intermittent                   OT Treatments/Exercises (OP) - 07/29/18 0001      ADLs   ADL Education Given  Yes      Manual Therapy   Manual Therapy  Edema management;Myofascial release;Soft tissue mobilization    Manual therapy comments  MFR to proximal volar arm, antecubutal fossa and medial elbow in supine with elbow and wrist extended to neutral and L  shoulder in 90 degrees of abduction in effor to release dense axillary cording.    Manual Lymphatic Drainage (MLD)  No MLD today in keeping w/ LE precautions.    Compression Bandaging  No wraps in clinic after MLD. 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    Person(s) Educated  Patient    Methods  Explanation;Demonstration;Handout;Tactile cues;Verbal cues    Comprehension  Verbalized understanding;Need further instruction;Returned demonstration;Verbal cues required;Tactile cues required          OT Long Term Goals - 07/29/18 1637      OT LONG TERM GOAL #1   Title  Pt will be able to apply multi-layer short stretch compression wraps using correct gradient techniques with independently , but with extra time  to achieve optimal limb volume reduction during Intensive Phase CDT, and to return affected limb(s) , as closely as possible, to premorbid size and shape.    Baseline  Dependent    Time  2    Period  Weeks    Status  Achieved      OT LONG TERM GOAL #2   Title  Pt will demonstrate understanding of lymphedema (LE) precautions / prevention principals, including signs /  symptoms of cellulitis infection with modified independence using LE Workbook as printed reference to identify 6 precautions without verbal cues by end of 3rd  OT Rx visit.     Baseline  no knowledge    Time  3    Period  Days    Status  Achieved      OT LONG TERM GOAL #3   Title  Pt to achieve no less than 10% limb volume reduction in affected limb(s) during Intensive Phase CDT to control limb swelling, to improve tissue integrity and immune function, to improve ADLs performance and to improve functional mobility/ transfers.    Baseline  dependent    Time  12    Period  Weeks    Status  Partially Met      OT LONG TERM GOAL #4   Title  Pt will achieve 100% compliance with daily LE self-care home program components, including proper skin care, simple self-MLD, gradient compression wraps/ garments, and therapeutic exercise to ensure optimal Intensive Phase limb volume reduction to expedite compression garment/ device fitting.    Baseline  Max A    Time  12    Period  Weeks    Status  On-going      OT LONG TERM GOAL #5   Title  Pt will demonstrate competent use of assistive devices during LE self-care training to improve ability to don and doff compression garments/devices with modified assistance to ensure optimal LE self-management over time.    Baseline  Max A    Time  12    Status  On-going      OT LONG TERM GOAL #6   Title  Pt will retain optimal limb volume reductions achieved during Intensive Phase CDT with no more than 3% volume increase  without CG assistance to limit LE progression and further functional decline.    Baseline  Dependent    Time  6    Period  Months    Status  On-going            Plan - 07/29/18 1633    Clinical Impression Statement  MFR to proximal volar arm, antecubutal fossa and medial elbow in supine with elbow and wrist extended to neutral and L shoulder in 90 degrees of abduction in effort to release tight L axillary cording. Pt tolerated treatment  well without difficul;ty. Will resume MLD after Pt has started new oral antibiotic and taken it for 72 hours.  Pt demonstrates steady progress towards all OT goals for LE self care. Limb volume is decreased since commencing Rx and Pt is able  to use LUE more functionaly at  work and at home. 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 Custom compression sleeve and glove have been ordered. We're researching option of exploring sequetial pneumatic compr4ession device for long term self management. Will provide info then defer to referring physician's final decision. See Long Term Goals sectuion for detailed progress. Cont as per POC.    Occupational performance deficits (Please refer to evaluation for details):  ADL's;Leisure;Social Participation;IADL's;Rest and Sleep;Work;Other   body image   Rehab Potential  Good    OT Frequency  3x / week    OT Duration  Other (comment)   and PRN   OT Treatment/Interventions  Self-care/ADL training;Therapeutic exercise;Manual lymph drainage;Compression bandaging;Patient/family education;Other (comment);Energy conservation;Therapeutic activities;Balance training;DME and/or AE instruction;Manual Therapy;Passive range of motion    Clinical Decision Making  Multiple treatment options, significant modification of task necessary    Recommended Other Services  fit with custom, clat knit LUE compression sleeve and HOS device. Consider seamless compression glove and vest. Consider kinesiotape during Intensive Phase    Consulted and Agree with Plan of Care  Patient       Patient will benefit from skilled therapeutic intervention in order to improve the following deficits and impairments:  Decreased endurance, Decreased mobility, Decreased skin integrity, Impaired sensation, Decreased knowledge of precautions, Decreased range of motion, Decreased scar mobility, Increased edema, Pain, Decreased activity tolerance, Decreased knowledge of  use of DME, Decreased strength, Impaired flexibility, Impaired UE functional use, Decreased psychosocial skills, Decreased balance, Other (comment)(balance and gait negatively impacted by guarding  posture , impaired armswing and body asymmetry)  Visit Diagnosis: Lymphedema, not elsewhere classified    Problem List Patient Active Problem List   Diagnosis Date Noted  . Anemia 05/10/2018  . Perforated viscus   . Ileus ([place])   . Constipation 10/10/2017  . Metastatic melanoma ([place]) 07/07/2017  . Axillary mass, left 06/26/2017  . Chronic pain of left knee 12/14/2015  . Influenza vaccination declined 11/27/2015    Andrey Spearman, MS, OTR/L, [facility] 07/29/18 4:39 PM  [place] MAIN [facility] SERVICES [address] [place], [place], [address] Phone: [phone]   Fax:  [phone]  Name: Louis Davies MRN: [id number] Date of Birth: Aug 24, 1951

## 2018-08-02 ENCOUNTER — Ambulatory Visit: Payer: Medicare Other | Admitting: Occupational Therapy

## 2018-08-02 DIAGNOSIS — E8989 Other postprocedural endocrine and metabolic complications and disorders: Secondary | ICD-10-CM | POA: Diagnosis not present

## 2018-08-02 DIAGNOSIS — I890 Lymphedema, not elsewhere classified: Secondary | ICD-10-CM | POA: Diagnosis not present

## 2018-08-02 NOTE — Therapy (Signed)
[place] MAIN [facility] SERVICES [address] [place], [place], [address] Phone: [phone]   Fax:  [phone]  Occupational Therapy Treatment  Patient Details  Name: Louis Davies MRN: [id number] Date of Birth: September 04, 1951 No data recorded  Encounter Date: 08/02/2018  OT End of Session - 08/02/18 1534    Visit Number  11    Number of Visits  36    Date for OT Re-Evaluation  09/27/18    OT Start Time  0215    OT Stop Time  0315    OT Time Calculation (min)  60 min    Activity Tolerance  Patient tolerated treatment well;Patient limited by fatigue;No increased pain    Behavior During Therapy  WFL for tasks assessed/performed       Past Medical History:  Diagnosis Date  . Anemia   . Arthritis   . Cancer ([place])   . Complication of anesthesia   . PONV (postoperative nausea and vomiting)     Past Surgical History:  Procedure Laterality Date  . AXILLARY LYMPH NODE DISSECTION Left 06/26/2017   Procedure: EXCISION OF LEFT AXILLARY MASS;  Surgeon: Johnathan Hausen, MD;  Location: [facility] ORS;  Service: General;  Laterality: Left;  . DIAGNOSTIC LAPAROSCOPY    . EMBOLIZATION Left 05/11/2018   Procedure: EMBOLIZATION;  Surgeon: Katha Cabal, MD;  Location: [place] CV LAB;  Service: Cardiovascular;  Laterality: Left;  . LAPAROTOMY N/A 10/14/2017   Procedure: EXPLORATORY LAPAROTOMY;  Surgeon: Clayburn Pert, MD;  Location: [facility] ORS;  Service: General;  Laterality: N/A;  . VASECTOMY      There were no vitals filed for this visit.  Subjective Assessment - 08/02/18 1529    Subjective   Pt presents for OT visit 11/36 for Complete Decongestive Therapy (CDT) for melanoma-related  LUE/LUq lymphedema (LE) . Pt presents with loaned, OTS, class 2 compression sleeve in place on LUE. Pt has no new complaints. He reports that his hand swelling has nearly resolved.      Currently in Pain?  Yes    Pain Score  --   not rated   Pain Location  Arm    Pain Orientation  Left    Pain Descriptors / Indicators  Heaviness;Discomfort;Tightness;Guarding;Tiring    Pain Onset  --   post surgical                  OT Treatments/Exercises (OP) - 08/02/18 0001      ADLs   ADL Education Given  Yes      Manual Therapy   Manual Therapy  Edema management;Myofascial release;Soft tissue mobilization    Manual therapy comments  MFR to proximal volar arm, antecubutal fossa and medial elbow in supine with elbow and wrist extended to neutral and L  shoulder in 90 degrees of abduction in effor to release dense axillary cording.    Manual Lymphatic Drainage (MLD)  MLD in combination with myofacial release to LUE,/ LUQ as established    Compression Bandaging  No wraps in clinic after MLD. Pt will apply at home by request    Other Manual Therapy  sleeve donned with Max A  due to time constraints.             OT Education - 08/02/18 1533    Education Details  Continued skilled Pt/caregiver education  And LE ADL training throughout visit for lymphedema self care/ home program, including compression wrapping, compression garment and device wear/care, lymphatic pumping ther ex, simple  self-MLD, and skin care. Discussed progress towards goals.     Person(s) Educated  Patient    Methods  Explanation;Demonstration;Handout;Tactile cues;Verbal cues    Comprehension  Verbalized understanding;Need further instruction;Returned demonstration;Verbal cues required;Tactile cues required          OT Long Term Goals - 07/29/18 1637      OT LONG TERM GOAL #1   Title  Pt will be able to apply multi-layer short stretch compression wraps using correct gradient techniques with independently , but with extra time  to achieve optimal limb volume reduction during Intensive Phase CDT, and to return affected limb(s) , as closely as possible, to premorbid size and shape.    Baseline  Dependent    Time  2    Period  Weeks    Status  Achieved      OT LONG TERM  GOAL #2   Title  Pt will demonstrate understanding of lymphedema (LE) precautions / prevention principals, including signs / symptoms of cellulitis infection with modified independence using LE Workbook as printed reference to identify 6 precautions without verbal cues by end of 3rd  OT Rx visit.     Baseline  no knowledge    Time  3    Period  Days    Status  Achieved      OT LONG TERM GOAL #3   Title  Pt to achieve no less than 10% limb volume reduction in affected limb(s) during Intensive Phase CDT to control limb swelling, to improve tissue integrity and immune function, to improve ADLs performance and to improve functional mobility/ transfers.    Baseline  dependent    Time  12    Period  Weeks    Status  Partially Met      OT LONG TERM GOAL #4   Title  Pt will achieve 100% compliance with daily LE self-care home program components, including proper skin care, simple self-MLD, gradient compression wraps/ garments, and therapeutic exercise to ensure optimal Intensive Phase limb volume reduction to expedite compression garment/ device fitting.    Baseline  Max A    Time  12    Period  Weeks    Status  On-going      OT LONG TERM GOAL #5   Title  Pt will demonstrate competent use of assistive devices during LE self-care training to improve ability to don and doff compression garments/devices with modified assistance to ensure optimal LE self-management over time.    Baseline  Max A    Time  12    Status  On-going      OT LONG TERM GOAL #6   Title  Pt will retain optimal limb volume reductions achieved during Intensive Phase CDT with no more than 3% volume increase  without CG assistance to limit LE progression and further functional decline.    Baseline  Dependent    Time  6    Period  Months    Status  On-going            Plan - 08/02/18 1534    Clinical Impression Statement  Provided MFR and MLD to LUE and LUQ as established. Pt reporting decreased axillary tightness and  demonstraring markedly increased shoulder flexion and abduction after treatment today.  Pt reports he performs flexibility stetches daily  and  in bed at night as well. Compliance with ther ex is paying off with results and is decreased guarding. Pt continues to need reminders to neutralize shoulder posture. DME  vendor notifies therapist that [employer] is fully funded by [employer]. OT will request MD revierw if the device and script if appropriate. Then we will schedule a demo for Pt with device as well. Compression garments have been ordered.     Occupational performance deficits (Please refer to evaluation for details):  ADL's;Leisure;Social Participation;IADL's;Rest and Sleep;Work;Other   body image   Rehab Potential  Good    OT Frequency  3x / week    OT Duration  Other (comment)   and PRN   OT Treatment/Interventions  Self-care/ADL training;Therapeutic exercise;Manual lymph drainage;Compression bandaging;Patient/family education;Other (comment);Energy conservation;Therapeutic activities;Balance training;DME and/or AE instruction;Manual Therapy;Passive range of motion    Clinical Decision Making  Multiple treatment options, significant modification of task necessary    Recommended Other Services  fit with custom, clat knit LUE compression sleeve and HOS device. Consider seamless compression glove and vest. Consider kinesiotape during Intensive Phase    Consulted and Agree with Plan of Care  Patient       Patient will benefit from skilled therapeutic intervention in order to improve the following deficits and impairments:  Decreased endurance, Decreased mobility, Decreased skin integrity, Impaired sensation, Decreased knowledge of precautions, Decreased range of motion, Decreased scar mobility, Increased edema, Pain, Decreased activity tolerance, Decreased knowledge of use of DME, Decreased strength, Impaired flexibility, Impaired UE functional use, Decreased psychosocial skills,  Decreased balance, Other (comment)(balance and gait negatively impacted by guarding  posture , impaired armswing and body asymmetry)  Visit Diagnosis: Lymphedema, not elsewhere classified    Problem List Patient Active Problem List   Diagnosis Date Noted  . Anemia 05/10/2018  . Perforated viscus   . Ileus ([place])   . Constipation 10/10/2017  . Metastatic melanoma ([place]) 07/07/2017  . Axillary mass, left 06/26/2017  . Chronic pain of left knee 12/14/2015  . Influenza vaccination declined 11/27/2015    Andrey Spearman, MS, OTR/L, [facility] 08/02/18 3:39 PM   [place] MAIN [facility] SERVICES [address] [place], [place], [address] Phone: [phone]   Fax:  [phone]  Name: Louis Davies MRN: [id number] Date of Birth: 04-02-1951

## 2018-08-03 ENCOUNTER — Other Ambulatory Visit: Payer: Self-pay | Admitting: Oncology

## 2018-08-03 ENCOUNTER — Ambulatory Visit: Payer: Medicare Other | Admitting: Occupational Therapy

## 2018-08-03 DIAGNOSIS — I890 Lymphedema, not elsewhere classified: Secondary | ICD-10-CM | POA: Diagnosis not present

## 2018-08-03 DIAGNOSIS — E8989 Other postprocedural endocrine and metabolic complications and disorders: Secondary | ICD-10-CM | POA: Diagnosis not present

## 2018-08-03 DIAGNOSIS — C439 Malignant melanoma of skin, unspecified: Secondary | ICD-10-CM

## 2018-08-03 DIAGNOSIS — C799 Secondary malignant neoplasm of unspecified site: Secondary | ICD-10-CM

## 2018-08-03 NOTE — Therapy (Signed)
[place] MAIN [facility] SERVICES [address] [place], [place], [address] Phone: [phone]   Fax:  [phone]  Occupational Therapy Treatment  Patient Details  Name: Louis Davies MRN: [id number] Date of Birth: 1951/02/02 No data recorded  Encounter Date: 08/03/2018  OT End of Session - 08/03/18 1647    Visit Number  12    Number of Visits  36    Date for OT Re-Evaluation  09/27/18    OT Start Time  0200    OT Stop Time  0300    OT Time Calculation (min)  60 min    Activity Tolerance  Patient tolerated treatment well;Patient limited by fatigue;No increased pain    Behavior During Therapy  WFL for tasks assessed/performed       Past Medical History:  Diagnosis Date  . Anemia   . Arthritis   . Cancer ([place])   . Complication of anesthesia   . PONV (postoperative nausea and vomiting)     Past Surgical History:  Procedure Laterality Date  . AXILLARY LYMPH NODE DISSECTION Left 06/26/2017   Procedure: EXCISION OF LEFT AXILLARY MASS;  Surgeon: Johnathan Hausen, MD;  Location: [facility] ORS;  Service: General;  Laterality: Left;  . DIAGNOSTIC LAPAROSCOPY    . EMBOLIZATION Left 05/11/2018   Procedure: EMBOLIZATION;  Surgeon: Katha Cabal, MD;  Location: [place] [facility];  Service: Cardiovascular;  Laterality: Left;  . LAPAROTOMY N/A 10/14/2017   Procedure: EXPLORATORY LAPAROTOMY;  Surgeon: Clayburn Pert, MD;  Location: [facility] ORS;  Service: General;  Laterality: N/A;  . VASECTOMY      There were no vitals filed for this visit.  Subjective Assessment - 08/03/18 1646    Subjective   Pt presents for OT visit 12/36 for Complete Decongestive Therapy (CDT) for melanoma-related  LUE/LUq lymphedema (LE) . Pt presents with loaned, OTS, class 2 compression sleeve in place on LUE. Pt has no new complaints. He reports he feels tired today.    Pain Onset  --   post surgical                  OT Treatments/Exercises (OP) - 08/03/18  0001      ADLs   ADL Education Given  Yes      Manual Therapy   Manual Therapy  Edema management;Myofascial release;Soft tissue mobilization    Manual Lymphatic Drainage (MLD)  MLD in combination with myofacial release to LUE,/ LUQ as established    Compression Bandaging  No wraps in clinic after MLD. Pt will apply at home by request    Other Manual Therapy  sleeve donned with Max A  due to time constraints.             OT Education - 08/03/18 1647    Education Details  Continued skilled Pt/caregiver education  And LE ADL training throughout visit for lymphedema self care/ home program, including compression wrapping, compression garment and device wear/care, lymphatic pumping ther ex, simple self-MLD, and skin care. Discussed progress towards goals.     Person(s) Educated  Patient    Methods  Explanation;Demonstration;Handout;Tactile cues;Verbal cues    Comprehension  Verbalized understanding;Need further instruction;Returned demonstration;Verbal cues required;Tactile cues required          OT Long Term Goals - 07/29/18 1637      OT LONG TERM GOAL #1   Title  Pt will be able to apply multi-layer short stretch compression wraps using correct gradient techniques with independently , but  with extra time  to achieve optimal limb volume reduction during Intensive Phase CDT, and to return affected limb(s) , as closely as possible, to premorbid size and shape.    Baseline  Dependent    Time  2    Period  Weeks    Status  Achieved      OT LONG TERM GOAL #2   Title  Pt will demonstrate understanding of lymphedema (LE) precautions / prevention principals, including signs / symptoms of cellulitis infection with modified independence using LE Workbook as printed reference to identify 6 precautions without verbal cues by end of 3rd  OT Rx visit.     Baseline  no knowledge    Time  3    Period  Days    Status  Achieved      OT LONG TERM GOAL #3   Title  Pt to achieve no less than  10% limb volume reduction in affected limb(s) during Intensive Phase CDT to control limb swelling, to improve tissue integrity and immune function, to improve ADLs performance and to improve functional mobility/ transfers.    Baseline  dependent    Time  12    Period  Weeks    Status  Partially Met      OT LONG TERM GOAL #4   Title  Pt will achieve 100% compliance with daily LE self-care home program components, including proper skin care, simple self-MLD, gradient compression wraps/ garments, and therapeutic exercise to ensure optimal Intensive Phase limb volume reduction to expedite compression garment/ device fitting.    Baseline  Max A    Time  12    Period  Weeks    Status  On-going      OT LONG TERM GOAL #5   Title  Pt will demonstrate competent use of assistive devices during LE self-care training to improve ability to don and doff compression garments/devices with modified assistance to ensure optimal LE self-management over time.    Baseline  Max A    Time  12    Status  On-going      OT LONG TERM GOAL #6   Title  Pt will retain optimal limb volume reductions achieved during Intensive Phase CDT with no more than 3% volume increase  without CG assistance to limit LE progression and further functional decline.    Baseline  Dependent    Time  6    Period  Months    Status  On-going            Plan - 08/03/18 1648    Clinical Impression Statement  Pt tolerated MLD to LUE/LUQ, myofacial release to L axilla and trunk, and AROM stretching without difficulty today. Briefly discussed [employer] as Chartered loss adjuster for self management over time if approved my referring MD. Pt will consider. Cont as per POC.    Occupational performance deficits (Please refer to evaluation for details):  ADL's;Leisure;Social Participation;IADL's;Rest and Sleep;Work;Other   body image   Rehab Potential  Good    OT Frequency  3x / week    OT Duration  Other (comment)   and PRN   OT  Treatment/Interventions  Self-care/ADL training;Therapeutic exercise;Manual lymph drainage;Compression bandaging;Patient/family education;Other (comment);Energy conservation;Therapeutic activities;Balance training;DME and/or AE instruction;Manual Therapy;Passive range of motion    Clinical Decision Making  Multiple treatment options, significant modification of task necessary    Recommended Other Services  fit with custom, clat knit LUE compression sleeve and HOS device. Consider seamless compression glove and vest. Consider kinesiotape  during Intensive Phase    Consulted and Agree with Plan of Care  Patient       Patient will benefit from skilled therapeutic intervention in order to improve the following deficits and impairments:  Decreased endurance, Decreased mobility, Decreased skin integrity, Impaired sensation, Decreased knowledge of precautions, Decreased range of motion, Decreased scar mobility, Increased edema, Pain, Decreased activity tolerance, Decreased knowledge of use of DME, Decreased strength, Impaired flexibility, Impaired UE functional use, Decreased psychosocial skills, Decreased balance, Other (comment)(balance and gait negatively impacted by guarding  posture , impaired armswing and body asymmetry)  Visit Diagnosis: Lymphedema, not elsewhere classified    Problem List Patient Active Problem List   Diagnosis Date Noted  . Anemia 05/10/2018  . Perforated viscus   . Ileus ([place])   . Constipation 10/10/2017  . Metastatic melanoma ([place]) 07/07/2017  . Axillary mass, left 06/26/2017  . Chronic pain of left knee 12/14/2015  . Influenza vaccination declined 11/27/2015   Andrey Spearman, MS, OTR/L, [facility] 08/03/18 4:51 PM   [place] MAIN [facility] SERVICES [address] [place], [place], [address] Phone: [phone]   Fax:  [phone]  Name: Louis Davies MRN: [id number] Date of Birth: [birth year]

## 2018-08-05 ENCOUNTER — Ambulatory Visit: Payer: Medicare Other | Admitting: Occupational Therapy

## 2018-08-05 MED FILL — ZELBORAF 240 MG TABS: 240 | 30 days supply | Qty: 180 | Fill #0

## 2018-08-09 ENCOUNTER — Ambulatory Visit: Payer: Medicare Other | Admitting: Occupational Therapy

## 2018-08-09 ENCOUNTER — Encounter: Payer: Medicare Other | Attending: Physician Assistant | Admitting: Physician Assistant

## 2018-08-09 DIAGNOSIS — E11622 Type 2 diabetes mellitus with other skin ulcer: Secondary | ICD-10-CM | POA: Insufficient documentation

## 2018-08-09 DIAGNOSIS — C773 Secondary and unspecified malignant neoplasm of axilla and upper limb lymph nodes: Secondary | ICD-10-CM | POA: Diagnosis not present

## 2018-08-09 DIAGNOSIS — I509 Heart failure, unspecified: Secondary | ICD-10-CM | POA: Insufficient documentation

## 2018-08-09 DIAGNOSIS — I110 Hypertensive heart disease with heart failure: Secondary | ICD-10-CM | POA: Insufficient documentation

## 2018-08-09 DIAGNOSIS — E119 Type 2 diabetes mellitus without complications: Secondary | ICD-10-CM | POA: Diagnosis present

## 2018-08-09 DIAGNOSIS — Z809 Family history of malignant neoplasm, unspecified: Secondary | ICD-10-CM | POA: Insufficient documentation

## 2018-08-09 DIAGNOSIS — L98495 Non-pressure chronic ulcer of skin of other sites with muscle involvement without evidence of necrosis: Secondary | ICD-10-CM | POA: Insufficient documentation

## 2018-08-09 DIAGNOSIS — Z8249 Family history of ischemic heart disease and other diseases of the circulatory system: Secondary | ICD-10-CM | POA: Insufficient documentation

## 2018-08-09 DIAGNOSIS — Z923 Personal history of irradiation: Secondary | ICD-10-CM | POA: Insufficient documentation

## 2018-08-09 DIAGNOSIS — L98492 Non-pressure chronic ulcer of skin of other sites with fat layer exposed: Secondary | ICD-10-CM | POA: Diagnosis not present

## 2018-08-10 ENCOUNTER — Ambulatory Visit: Payer: Medicare Other | Admitting: Occupational Therapy

## 2018-08-10 DIAGNOSIS — E8989 Other postprocedural endocrine and metabolic complications and disorders: Secondary | ICD-10-CM | POA: Diagnosis not present

## 2018-08-10 DIAGNOSIS — I890 Lymphedema, not elsewhere classified: Secondary | ICD-10-CM | POA: Diagnosis not present

## 2018-08-10 NOTE — Therapy (Signed)
[place] MAIN [facility] SERVICES [address] [place], [place], [address] Phone: [phone]   Fax:  [phone]  Occupational Therapy Treatment  Patient Details  Name: Louis Davies MRN: [id number] Date of Birth: [birth year] No data recorded  Encounter Date: 08/10/2018  OT End of Session - 08/10/18 1701    Visit Number  13    Number of Visits  36    Date for OT Re-Evaluation  09/27/18    OT Start Time  0205    OT Stop Time  0300    OT Time Calculation (min)  55 min    Activity Tolerance  Patient tolerated treatment well;Patient limited by fatigue;No increased pain    Behavior During Therapy  WFL for tasks assessed/performed       Past Medical History:  Diagnosis Date  . Anemia   . Arthritis   . Cancer ([place])   . Complication of anesthesia   . PONV (postoperative nausea and vomiting)     Past Surgical History:  Procedure Laterality Date  . AXILLARY LYMPH NODE DISSECTION Left 06/26/2017   Procedure: EXCISION OF LEFT AXILLARY MASS;  Surgeon: Johnathan Hausen, MD;  Location: [facility] ORS;  Service: General;  Laterality: Left;  . DIAGNOSTIC LAPAROSCOPY    . EMBOLIZATION Left 05/11/2018   Procedure: EMBOLIZATION;  Surgeon: Katha Cabal, MD;  Location: [place] [facility];  Service: Cardiovascular;  Laterality: Left;  . LAPAROTOMY N/A 10/14/2017   Procedure: EXPLORATORY LAPAROTOMY;  Surgeon: Clayburn Pert, MD;  Location: [facility] ORS;  Service: General;  Laterality: N/A;  . VASECTOMY      There were no vitals filed for this visit.  Subjective Assessment - 08/10/18 1659    Subjective   Pt presents for OT visit 13/36 for Complete Decongestive Therapy (CDT) for melanoma-related  LUE/LUq lymphedema (LE) . Pt presents with loaned, OTS, class 2 compression sleeve in place on LUE. Pt reports he has ordered recommended compression garments. "My arm and hand hasn't swollen in over a week."    Currently in Pain?  Yes   not rated   Pain Location   Arm    Pain Orientation  Left    Pain Descriptors / Indicators  Heaviness;Tightness;Guarding;Tiring    Pain Type  Chronic pain    Pain Onset  --   post surgical                  OT Treatments/Exercises (OP) - 08/10/18 0001      ADLs   ADL Education Given  Yes      Manual Therapy   Manual Therapy  Edema management;Myofascial release;Soft tissue mobilization    Manual therapy comments  MFR to proximal volar arm, antecubutal fossa and medial elbow in supine with elbow and wrist extended to neutral and L  shoulder in 90 degrees of abduction in effor to release dense axillary cording.    Manual Lymphatic Drainage (MLD)  MLD in combination with myofacial release to LUE,/ LUQ as established    Compression Bandaging  No wraps in clinic after MLD. Pt will apply at home by request    Other Manual Therapy  sleeve donned with Max A  due to time constraints.             OT Education - 08/10/18 1701    Education Details  Continued skilled Pt/caregiver education  And LE ADL training throughout visit for lymphedema self care/ home program, including compression wrapping, compression garment and device wear/care, lymphatic  pumping ther ex, simple self-MLD, and skin care. Discussed progress towards goals.     Person(s) Educated  Patient    Methods  Explanation;Demonstration;Handout;Tactile cues;Verbal cues    Comprehension  Verbalized understanding;Need further instruction;Returned demonstration;Verbal cues required;Tactile cues required          OT Long Term Goals - 07/29/18 1637      OT LONG TERM GOAL #1   Title  Pt will be able to apply multi-layer short stretch compression wraps using correct gradient techniques with independently , but with extra time  to achieve optimal limb volume reduction during Intensive Phase CDT, and to return affected limb(s) , as closely as possible, to premorbid size and shape.    Baseline  Dependent    Time  2    Period  Weeks    Status   Achieved      OT LONG TERM GOAL #2   Title  Pt will demonstrate understanding of lymphedema (LE) precautions / prevention principals, including signs / symptoms of cellulitis infection with modified independence using LE Workbook as printed reference to identify 6 precautions without verbal cues by end of 3rd  OT Rx visit.     Baseline  no knowledge    Time  3    Period  Days    Status  Achieved      OT LONG TERM GOAL #3   Title  Pt to achieve no less than 10% limb volume reduction in affected limb(s) during Intensive Phase CDT to control limb swelling, to improve tissue integrity and immune function, to improve ADLs performance and to improve functional mobility/ transfers.    Baseline  dependent    Time  12    Period  Weeks    Status  Partially Met      OT LONG TERM GOAL #4   Title  Pt will achieve 100% compliance with daily LE self-care home program components, including proper skin care, simple self-MLD, gradient compression wraps/ garments, and therapeutic exercise to ensure optimal Intensive Phase limb volume reduction to expedite compression garment/ device fitting.    Baseline  Max A    Time  12    Period  Weeks    Status  On-going      OT LONG TERM GOAL #5   Title  Pt will demonstrate competent use of assistive devices during LE self-care training to improve ability to don and doff compression garments/devices with modified assistance to ensure optimal LE self-management over time.    Baseline  Max A    Time  12    Status  On-going      OT LONG TERM GOAL #6   Title  Pt will retain optimal limb volume reductions achieved during Intensive Phase CDT with no more than 3% volume increase  without CG assistance to limit LE progression and further functional decline.    Baseline  Dependent    Time  6    Period  Months    Status  On-going            Plan - 08/10/18 1702    Clinical Impression Statement  Pt tolerated MLD in supine and sidelying to LUEE and LUQ as  established. Pt has regained~175 degrees of L shoulder flexion, but he tends to compensate with abduction at endrange, and continues to denonstrate shoulder elevation on the left during most clinical activit8ies and when ambulating. Cont as per OPC. Fit custom compression garemts when available.    Occupational performance deficits (  Please refer to evaluation for details):  ADL's;Leisure;Social Participation;IADL's;Rest and Sleep;Work;Other   body image   Rehab Potential  Good    OT Frequency  3x / week    OT Duration  Other (comment)   and PRN   OT Treatment/Interventions  Self-care/ADL training;Therapeutic exercise;Manual lymph drainage;Compression bandaging;Patient/family education;Other (comment);Energy conservation;Therapeutic activities;Balance training;DME and/or AE instruction;Manual Therapy;Passive range of motion    Clinical Decision Making  Multiple treatment options, significant modification of task necessary    Recommended Other Services  fit with custom, clat knit LUE compression sleeve and HOS device. Consider seamless compression glove and vest. Consider kinesiotape during Intensive Phase    Consulted and Agree with Plan of Care  Patient       Patient will benefit from skilled therapeutic intervention in order to improve the following deficits and impairments:  Decreased endurance, Decreased mobility, Decreased skin integrity, Impaired sensation, Decreased knowledge of precautions, Decreased range of motion, Decreased scar mobility, Increased edema, Pain, Decreased activity tolerance, Decreased knowledge of use of DME, Decreased strength, Impaired flexibility, Impaired UE functional use, Decreased psychosocial skills, Decreased balance, Other (comment)(balance and gait negatively impacted by guarding  posture , impaired armswing and body asymmetry)  Visit Diagnosis: Lymphedema, not elsewhere classified    Problem List Patient Active Problem List   Diagnosis Date Noted  .  Anemia 05/10/2018  . Perforated viscus   . Ileus ([place])   . Constipation 10/10/2017  . Metastatic melanoma ([place]) 07/07/2017  . Axillary mass, left 06/26/2017  . Chronic pain of left knee 12/14/2015  . Influenza vaccination declined 11/27/2015    Andrey Spearman, MS, OTR/L, [facility] 08/10/18 5:06 PM  [place] MAIN [facility] SERVICES [address] [place], [place], [address] Phone: [phone]   Fax:  [phone]  Name: Louis Davies MRN: [id number] Date of Birth: 1951-09-10

## 2018-08-12 ENCOUNTER — Ambulatory Visit: Payer: Medicare Other | Admitting: Occupational Therapy

## 2018-08-12 DIAGNOSIS — I890 Lymphedema, not elsewhere classified: Secondary | ICD-10-CM

## 2018-08-12 DIAGNOSIS — E8989 Other postprocedural endocrine and metabolic complications and disorders: Secondary | ICD-10-CM | POA: Diagnosis not present

## 2018-08-12 NOTE — Therapy (Signed)
[place] MAIN [facility] SERVICES [address] [place], [place], [address] Phone: [phone]   Fax:  [phone]  Occupational Therapy Treatment  Patient Details  Name: Louis Davies MRN: [id number] Date of Birth: 15-Jun-1951 No data recorded  Encounter Date: 08/12/2018  OT End of Session - 08/12/18 1712    Visit Number  14    Number of Visits  36    Date for OT Re-Evaluation  09/27/18    OT Start Time  0205    OT Stop Time  0305    OT Time Calculation (min)  60 min    Activity Tolerance  Patient tolerated treatment well;Patient limited by fatigue;No increased pain    Behavior During Therapy  WFL for tasks assessed/performed       Past Medical History:  Diagnosis Date  . Anemia   . Arthritis   . Cancer ([place])   . Complication of anesthesia   . PONV (postoperative nausea and vomiting)     Past Surgical History:  Procedure Laterality Date  . AXILLARY LYMPH NODE DISSECTION Left 06/26/2017   Procedure: EXCISION OF LEFT AXILLARY MASS;  Surgeon: Johnathan Hausen, MD;  Location: [facility] ORS;  Service: General;  Laterality: Left;  . DIAGNOSTIC LAPAROSCOPY    . EMBOLIZATION Left 05/11/2018   Procedure: EMBOLIZATION;  Surgeon: Katha Cabal, MD;  Location: [place] [facility];  Service: Cardiovascular;  Laterality: Left;  . LAPAROTOMY N/A 10/14/2017   Procedure: EXPLORATORY LAPAROTOMY;  Surgeon: Clayburn Pert, MD;  Location: [facility] ORS;  Service: General;  Laterality: N/A;  . VASECTOMY      There were no vitals filed for this visit.  Subjective Assessment - 08/12/18 1709    Subjective   Pt presents for OT visit 14/36 for Complete Decongestive Therapy (CDT) for melanoma-related  LUE/LUq lymphedema (LE) . Pt presents with loaned, OTS, class 2 compression sleeve in place on LUE. Pt reports he has ordered recommended compression garments.     Currently in Pain?  Yes   not rated   Pain Location  Axilla    Pain Orientation  Left    Pain  Descriptors / Indicators  Tightness;Guarding    Pain Type  Chronic pain    Pain Onset  --   post surgical                  OT Treatments/Exercises (OP) - 08/12/18 0001      ADLs   ADL Education Given  Yes      Manual Therapy   Manual Therapy  Edema management;Myofascial release;Soft tissue mobilization    Manual therapy comments  MFR to proximal volar arm, antecubutal fossa and medial elbow in supine with elbow and wrist extended to neutral and L  shoulder in 90 degrees of abduction in effor to release dense axillary cording.    Manual Lymphatic Drainage (MLD)  MLD in combination with myofacial release to LUE,/ LUQ as established    Compression Bandaging  No wraps in clinic after MLD. Pt will apply at home by request    Other Manual Therapy  sleeve donned with Max A  due to time constraints.             OT Education - 08/12/18 1711    Education Details  Continued skilled Pt/caregiver education  And LE ADL training throughout visit for lymphedema self care/ home program, including compression wrapping, compression garment and device wear/care, lymphatic pumping ther ex, simple self-MLD, and skin care. Discussed  progress towards goals.     Person(s) Educated  Patient    Methods  Explanation;Demonstration;Handout;Tactile cues;Verbal cues    Comprehension  Verbalized understanding;Need further instruction;Returned demonstration;Verbal cues required;Tactile cues required          OT Long Term Goals - 07/29/18 1637      OT LONG TERM GOAL #1   Title  Pt will be able to apply multi-layer short stretch compression wraps using correct gradient techniques with independently , but with extra time  to achieve optimal limb volume reduction during Intensive Phase CDT, and to return affected limb(s) , as closely as possible, to premorbid size and shape.    Baseline  Dependent    Time  2    Period  Weeks    Status  Achieved      OT LONG TERM GOAL #2   Title  Pt will  demonstrate understanding of lymphedema (LE) precautions / prevention principals, including signs / symptoms of cellulitis infection with modified independence using LE Workbook as printed reference to identify 6 precautions without verbal cues by end of 3rd  OT Rx visit.     Baseline  no knowledge    Time  3    Period  Days    Status  Achieved      OT LONG TERM GOAL #3   Title  Pt to achieve no less than 10% limb volume reduction in affected limb(s) during Intensive Phase CDT to control limb swelling, to improve tissue integrity and immune function, to improve ADLs performance and to improve functional mobility/ transfers.    Baseline  dependent    Time  12    Period  Weeks    Status  Partially Met      OT LONG TERM GOAL #4   Title  Pt will achieve 100% compliance with daily LE self-care home program components, including proper skin care, simple self-MLD, gradient compression wraps/ garments, and therapeutic exercise to ensure optimal Intensive Phase limb volume reduction to expedite compression garment/ device fitting.    Baseline  Max A    Time  12    Period  Weeks    Status  On-going      OT LONG TERM GOAL #5   Title  Pt will demonstrate competent use of assistive devices during LE self-care training to improve ability to don and doff compression garments/devices with modified assistance to ensure optimal LE self-management over time.    Baseline  Max A    Time  12    Status  On-going      OT LONG TERM GOAL #6   Title  Pt will retain optimal limb volume reductions achieved during Intensive Phase CDT with no more than 3% volume increase  without CG assistance to limit LE progression and further functional decline.    Baseline  Dependent    Time  6    Period  Months    Status  On-going            Plan - 08/12/18 1712    Clinical Impression Statement  Provided MLD and MR to LUE/LUQ and L axilla with care around wound. Pt gaining L should AROM, but still experiencing pain at  end ranges of abduction and flexion with elbow extended. Pt had no difficulty tolerating manual therapy today. He donned   loaned compression sleeve independently after Rx. Cont as per POC. Fit custom compression garments as soon as available.Cont as per POC.    Occupational performance deficits (  Please refer to evaluation for details):  ADL's;Leisure;Social Participation;IADL's;Rest and Sleep;Work;Other   body image   Rehab Potential  Good    OT Frequency  3x / week    OT Duration  Other (comment)   and PRN   OT Treatment/Interventions  Self-care/ADL training;Therapeutic exercise;Manual lymph drainage;Compression bandaging;Patient/family education;Other (comment);Energy conservation;Therapeutic activities;Balance training;DME and/or AE instruction;Manual Therapy;Passive range of motion    Clinical Decision Making  Multiple treatment options, significant modification of task necessary    Recommended Other Services  fit with custom, clat knit LUE compression sleeve and HOS device. Consider seamless compression glove and vest. Consider kinesiotape during Intensive Phase    Consulted and Agree with Plan of Care  Patient       Patient will benefit from skilled therapeutic intervention in order to improve the following deficits and impairments:  Decreased endurance, Decreased mobility, Decreased skin integrity, Impaired sensation, Decreased knowledge of precautions, Decreased range of motion, Decreased scar mobility, Increased edema, Pain, Decreased activity tolerance, Decreased knowledge of use of DME, Decreased strength, Impaired flexibility, Impaired UE functional use, Decreased psychosocial skills, Decreased balance, Other (comment)(balance and gait negatively impacted by guarding  posture , impaired armswing and body asymmetry)  Visit Diagnosis: Lymphedema, not elsewhere classified    Problem List Patient Active Problem List   Diagnosis Date Noted  . Anemia 05/10/2018  . Perforated viscus    . Ileus ([place])   . Constipation 10/10/2017  . Metastatic melanoma ([place]) 07/07/2017  . Axillary mass, left 06/26/2017  . Chronic pain of left knee 12/14/2015  . Influenza vaccination declined 11/27/2015    Andrey Spearman, MS, OTR/L, [facility] 08/12/18 5:15 PM   [place] MAIN [facility] SERVICES [address] [place], [place], [address] Phone: [phone]   Fax:  [phone]  Name: Louis Davies MRN: [id number] Date of Birth: [birth year]

## 2018-08-14 NOTE — Progress Notes (Signed)
JOHNTE, PORTNOY ([id number]) Visit Report for 08/09/2018 Arrival Information Details Patient Name: CAL, GINDLESPERGER Date of Service: 08/09/2018 3:00 PM Medical Record Number: [id number] Patient Account Number: [id number] Date of Birth/Sex: 02-12-1951 (67 y.o. M) Treating RN: Cornell Barman Primary Care Krista Som: Alvera Singh Other Clinician: Referring Riyanna Crutchley: Alvera Singh Treating Mohammedali Bedoy/Extender: Melburn Hake, HOYT Weeks in Treatment: 7 Visit Information History Since Last Visit Added or deleted any medications: No Patient Arrived: Ambulatory Any new allergies or adverse reactions: No Arrival Time: 15:28 Had a fall or experienced change in No Accompanied By: self activities of daily living that may affect Transfer Assistance: None risk of falls: Patient Identification Verified: Yes Signs or symptoms of abuse/neglect since last visito No Secondary Verification Process Completed: Yes Hospitalized since last visit: No Implantable device outside of the clinic excluding No cellular tissue based products placed in the center since last visit: Has Dressing in Place as Prescribed: Yes Pain Present Now: No Electronic Signature(s) Signed: 08/09/2018 4:57:35 PM By: Lorine Bears RCP, RRT, CHT Entered By: Lorine Bears on 08/09/2018 15:30:02 [name] ([id number]) -------------------------------------------------------------------------------- Clinic Level of Care Assessment Details Patient Name: [name] Date of Service: 08/09/2018 3:00 PM Medical Record Number: [id number] Patient Account Number: [id number] Date of Birth/Sex: 10/01/1951 (67 y.o. M) Treating RN: Cornell Barman Primary Care Jonta Gastineau: Alvera Singh Other Clinician: Referring Keyshawna Prouse: Alvera Singh Treating Darica Goren/Extender: Melburn Hake, HOYT Weeks in Treatment: 7 Clinic Level of Care Assessment Items TOOL 4 Quantity Score []  - Use when only an EandM is performed  on FOLLOW-UP visit 0 ASSESSMENTS - Nursing Assessment / Reassessment X - Reassessment of Co-morbidities (includes updates in patient status) 1 10 X- 1 5 Reassessment of Adherence to Treatment Plan ASSESSMENTS - Wound and Skin Assessment / Reassessment X - Simple Wound Assessment / Reassessment - one wound 1 5 []  - 0 Complex Wound Assessment / Reassessment - multiple wounds []  - 0 Dermatologic / Skin Assessment (not related to wound area) ASSESSMENTS - Focused Assessment []  - Circumferential Edema Measurements - multi extremities 0 []  - 0 Nutritional Assessment / Counseling / Intervention []  - 0 Lower Extremity Assessment (monofilament, tuning fork, pulses) []  - 0 Peripheral Arterial Disease Assessment (using hand held doppler) ASSESSMENTS - Ostomy and/or Continence Assessment and Care []  - Incontinence Assessment and Management 0 []  - 0 Ostomy Care Assessment and Management (repouching, etc.) PROCESS - Coordination of Care X - Simple Patient / Family Education for ongoing care 1 15 []  - 0 Complex (extensive) Patient / Family Education for ongoing care []  - 0 Staff obtains Programmer, systems, Records, Test Results / Process Orders []  - 0 Staff telephones HHA, Nursing Homes / Clarify orders / etc []  - 0 Routine Transfer to another Facility (non-emergent condition) []  - 0 Routine Hospital Admission (non-emergent condition) []  - 0 New Admissions / Biomedical engineer / Ordering NPWT, Apligraf, etc. []  - 0 Emergency Hospital Admission (emergent condition) X- 1 10 Simple Discharge Coordination BRETTON, TANDY ([id number]) []  - 0 Complex (extensive) Discharge Coordination PROCESS - Special Needs []  - Pediatric / Minor Patient Management 0 []  - 0 Isolation Patient Management []  - 0 Hearing / Language / Visual special needs []  - 0 Assessment of Community assistance (transportation, D/C planning, etc.) []  - 0 Additional assistance / Altered mentation []  - 0 Support Surface(s)  Assessment (bed, cushion, seat, etc.) INTERVENTIONS - Wound Cleansing / Measurement X - Simple Wound Cleansing - one wound 1 5 []  - 0 Complex Wound Cleansing - multiple wounds  X- 1 5 Wound Imaging (photographs - any number of wounds) []  - 0 Wound Tracing (instead of photographs) X- 1 5 Simple Wound Measurement - one wound []  - 0 Complex Wound Measurement - multiple wounds INTERVENTIONS - Wound Dressings []  - Small Wound Dressing one or multiple wounds 0 X- 1 15 Medium Wound Dressing one or multiple wounds []  - 0 Large Wound Dressing one or multiple wounds []  - 0 Application of Medications - topical []  - 0 Application of Medications - injection INTERVENTIONS - Miscellaneous []  - External ear exam 0 []  - 0 Specimen Collection (cultures, biopsies, blood, body fluids, etc.) []  - 0 Specimen(s) / Culture(s) sent or taken to Lab for analysis []  - 0 Patient Transfer (multiple staff / Civil Service fast streamer / Similar devices) []  - 0 Simple Staple / Suture removal (25 or less) []  - 0 Complex Staple / Suture removal (26 or more) []  - 0 Hypo / Hyperglycemic Management (close monitor of Blood Glucose) []  - 0 Ankle / Brachial Index (ABI) - do not check if billed separately X- 1 5 Vital Signs Jacome, Renny D. ([id number]) Has the patient been seen at the hospital within the last three years: Yes Total Score: 80 Level Of Care: New/Established - Level 3 Electronic Signature(s) Signed: 08/09/2018 6:05:50 PM By: Gretta Cool, BSN, RN, CWS, Kim RN, BSN Entered By: Gretta Cool, BSN, RN, CWS, Kim on 08/09/2018 [place]. ([id number]) -------------------------------------------------------------------------------- Encounter Discharge Information Details Patient Name: [name] Date of Service: 08/09/2018 3:00 PM Medical Record Number: [id number] Patient Account Number: [id number] Date of Birth/Sex: [birth year] (67 y.o. M) Treating RN: Cornell Barman Primary Care Madlynn Lundeen: Alvera Singh  Other Clinician: Referring Jamorris Ndiaye: Alvera Singh Treating Chinita Schimpf/Extender: Melburn Hake, HOYT Weeks in Treatment: 7 Encounter Discharge Information Items Discharge Condition: Stable Ambulatory Status: Ambulatory Discharge Destination: Home Transportation: Private Auto Accompanied By: self Schedule Follow-up Appointment: Yes Clinical Summary of Care: Electronic Signature(s) Signed: 08/09/2018 5:33:11 PM By: Gretta Cool, BSN, RN, CWS, Kim RN, BSN Entered By: Gretta Cool, BSN, RN, CWS, Kim on 08/09/2018 17:33:11 [name] ([id number]) -------------------------------------------------------------------------------- Lower Extremity Assessment Details Patient Name: [name] Date of Service: 08/09/2018 3:00 PM Medical Record Number: [id number] Patient Account Number: [id number] Date of Birth/Sex: [birth year] (67 y.o. M) Treating RN: Secundino Ginger Primary Care [place] Mielke: Alvera Singh Other Clinician: Referring Chekesha Behlke: Alvera Singh Treating Janelli Welling/Extender: Melburn Hake, HOYT Weeks in Treatment: 7 Electronic Signature(s) Signed: 08/09/2018 4:40:10 PM By: Secundino Ginger Entered By: Secundino Ginger on 08/09/2018 15:36:29 [name] ([id number]) -------------------------------------------------------------------------------- Multi Wound Chart Details Patient Name: [name] Date of Service: 08/09/2018 3:00 PM Medical Record Number: [id number] Patient Account Number: [id number] Date of Birth/Sex: [birth year] (67 y.o. M) Treating RN: Cornell Barman Primary Care Torsten Weniger: Alvera Singh Other Clinician: Referring Skyelyn Scruggs: Alvera Singh Treating Courtany Mcmurphy/Extender: STONE III, HOYT Weeks in Treatment: 7 Vital Signs Height(in): 71 Pulse(bpm): 63 Weight(lbs): 185.6 Blood Pressure(mmHg): 113/55 Body Mass Index(BMI): 26 Temperature(F): 98.2 Respiratory Rate 16 (breaths/min): Photos: [1:No Photos] [N/A:N/A] Wound Location: [1:Left Axilla] [N/A:N/A] Wounding Event:  [1:Gradually Appeared] [N/A:N/A] Primary Etiology: [1:Malignant Wound] [N/A:N/A] Comorbid History: [1:Osteoarthritis, Received Chemotherapy, Received Radiation] [N/A:N/A] Date Acquired: [1:04/07/2018] [N/A:N/A] Weeks of Treatment: [1:7] [N/A:N/A] Wound Status: [1:Open] [N/A:N/A] Measurements L x W x D [1:0.5x0.9x2.9] [N/A:N/A] (cm) Area (cm) : [1:0.353] [N/A:N/A] Volume (cm) : [1:1.025] [N/A:N/A] % Reduction in Area: [1:54.60%] [N/A:N/A] % Reduction in Volume: [1:45.10%] [N/A:N/A] Classification: [1:Full Thickness Without Exposed Support Structures] [N/A:N/A] Exudate Amount: [1:Large] [N/A:N/A] Exudate Type: [1:Serosanguineous] [N/A:N/A] Exudate Color: [1:red, brown] [N/A:N/A] Wound Margin: [  1:Flat and Intact] [N/A:N/A] Granulation Amount: [1:None Present (0%)] [N/A:N/A] Necrotic Amount: [1:Large (67-100%)] [N/A:N/A] Exposed Structures: [1:Fat Layer (Subcutaneous Tissue) Exposed: Yes Fascia: No Tendon: No Muscle: No Joint: No Bone: No] [N/A:N/A] Epithelialization: [1:None] [N/A:N/A] Periwound Skin Texture: [1:Rash: Yes Excoriation: No Induration: No Callus: No] [N/A:N/A] Crepitus: No Scarring: No Periwound Skin Moisture: No Abnormalities Noted N/A N/A Periwound Skin Color: Atrophie Blanche: No N/A N/A Cyanosis: No Ecchymosis: No Erythema: No Hemosiderin Staining: No Mottled: No Pallor: No Rubor: No Tenderness on Palpation: No N/A N/A Wound Preparation: Ulcer Cleansing: N/A N/A Rinsed/Irrigated with Saline Topical Anesthetic Applied: None, Other: lidocaine 4% Treatment Notes Electronic Signature(s) Signed: 08/09/2018 6:05:50 PM By: Gretta Cool, BSN, RN, CWS, Kim RN, BSN Entered By: Gretta Cool, BSN, RN, CWS, Kim on 08/09/2018 15:58:22 [name] ([id number]) -------------------------------------------------------------------------------- Multi-Disciplinary Care Plan Details Patient Name: [name] Date of Service: 08/09/2018 3:00 PM Medical Record Number:  [id number] Patient Account Number: [id number] Date of Birth/Sex: 10/26/1950 (67 y.o. M) Treating RN: Cornell Barman Primary Care Jarquavious Fentress: Alvera Singh Other Clinician: Referring Eunice Oldaker: Alvera Singh Treating Emrys Mckamie/Extender: Melburn Hake, HOYT Weeks in Treatment: 7 Active Inactive ` Abuse / Safety / Falls / Self Care Management Nursing Diagnoses: Potential for falls Goals: Patient will not experience any injury related to falls Date Initiated: 06/15/2018 Target Resolution Date: 09/25/2018 Goal Status: Active Interventions: Assess Activities of Daily Living upon admission and as needed Assess fall risk on admission and as needed Assess: immobility, friction, shearing, incontinence upon admission and as needed Assess impairment of mobility on admission and as needed per policy Notes: ` Nutrition Nursing Diagnoses: Imbalanced nutrition Goals: Patient/caregiver agrees to and verbalizes understanding of need to use nutritional supplements and/or vitamins as prescribed Date Initiated: 06/15/2018 Target Resolution Date: 09/25/2018 Goal Status: Active Interventions: Assess patient nutrition upon admission and as needed per policy Notes: ` Orientation to the Wound Care Program Nursing Diagnoses: Knowledge deficit related to the wound healing center program Goals: Patient/caregiver will verbalize understanding of the [facility] Date Initiated: 06/15/2018 Target Resolution Date: 06/26/2018 GHALI, MORISSETTE ([id number]) Goal Status: Active Interventions: Provide education on orientation to the wound center Notes: ` Wound/Skin Impairment Nursing Diagnoses: Impaired tissue integrity Knowledge deficit related to ulceration/compromised skin integrity Goals: Ulcer/skin breakdown will have a volume reduction of 80% by week 12 Date Initiated: 06/15/2018 Target Resolution Date: 09/18/2018 Goal Status: Active Interventions: Assess patient/caregiver ability to  perform ulcer/skin care regimen upon admission and as needed Assess ulceration(s) every visit Notes: Electronic Signature(s) Signed: 08/09/2018 6:05:50 PM By: Gretta Cool, BSN, RN, CWS, Kim RN, BSN Entered By: Gretta Cool, BSN, RN, CWS, Kim on 08/09/2018 15:58:16 [name] ([id number]) -------------------------------------------------------------------------------- Pain Assessment Details Patient Name: [name] Date of Service: 08/09/2018 3:00 PM Medical Record Number: [id number] Patient Account Number: [id number] Date of Birth/Sex: 18-May-1951 (67 y.o. M) Treating RN: Cornell Barman Primary Care Mariene Dickerman: Alvera Singh Other Clinician: Referring Jaxden Blyden: Alvera Singh Treating Lanetra Hartley/Extender: Melburn Hake, HOYT Weeks in Treatment: 7 Active Problems Location of Pain Severity and Description of Pain Patient Has Paino No Site Locations Pain Management and Medication Current Pain Management: Electronic Signature(s) Signed: 08/09/2018 4:57:35 PM By: Lorine Bears RCP, RRT, CHT Signed: 08/09/2018 6:05:50 PM By: Gretta Cool, BSN, RN, CWS, Kim RN, BSN Entered By: Lorine Bears on 08/09/2018 15:30:10 [name] ([id number]) -------------------------------------------------------------------------------- Patient/Caregiver Education Details Patient Name: [name] Date of Service: 08/09/2018 3:00 PM Medical Record Number: [id number] Patient Account Number: [id number] Date of Birth/Gender: 1950/10/24 (67 y.o. M) Treating RN:  Cornell Barman Primary Care Physician: Alvera Singh Other Clinician: Referring Physician: Alvera Singh Treating Physician/Extender: Sharalyn Ink in Treatment: 7 Education Assessment Education Provided To: Patient Education Topics Provided Malignant/Atypical Wounds: Handouts: Malignant/Atypical Wounds Methods: Explain/Verbal Responses: State content correctly Wound/Skin Impairment: Handouts: Caring for  Your Ulcer Methods: Demonstration, Explain/Verbal Responses: State content correctly Electronic Signature(s) Signed: 08/09/2018 6:05:50 PM By: Gretta Cool, BSN, RN, CWS, Kim RN, BSN Entered By: Gretta Cool, BSN, RN, CWS, Kim on 08/09/2018 17:32:15 [name] ([id number]) -------------------------------------------------------------------------------- Wound Assessment Details Patient Name: [name] Date of Service: 08/09/2018 3:00 PM Medical Record Number: [id number] Patient Account Number: [id number] Date of Birth/Sex: 1951-08-20 (67 y.o. M) Treating RN: Secundino Ginger Primary Care Jeana Kersting: Alvera Singh Other Clinician: Referring Lafern Brinkley: Alvera Singh Treating Cheskel Silverio/Extender: Melburn Hake, HOYT Weeks in Treatment: 7 Wound Status Wound Number: 1 Primary Malignant Wound Etiology: Wound Location: Left Axilla Wound Status: Open Wounding Event: Gradually Appeared Comorbid Osteoarthritis, Received Chemotherapy, Date Acquired: 04/07/2018 History: Received Radiation Weeks Of Treatment: 7 Clustered Wound: No Photos Photo Uploaded By: Secundino Ginger on 08/09/2018 16:17:12 Wound Measurements Length: (cm) 0.5 Width: (cm) 0.9 Depth: (cm) 2.9 Area: (cm) 0.353 Volume: (cm) 1.025 % Reduction in Area: 54.6% % Reduction in Volume: 45.1% Epithelialization: None Tunneling: No Undermining: No Wound Description Full Thickness Without Exposed Support Classification: Structures Wound Margin: Flat and Intact Exudate Large Amount: Exudate Type: Serosanguineous Exudate Color: red, brown Foul Odor After Cleansing: No Slough/Fibrino Yes Wound Bed Granulation Amount: None Present (0%) Exposed Structure Necrotic Amount: Large (67-100%) Fascia Exposed: No Necrotic Quality: Adherent Slough Fat Layer (Subcutaneous Tissue) Exposed: Yes Tendon Exposed: No Muscle Exposed: No Joint Exposed: No Bone Exposed: No Wahlert, Aurthur D. ([id number]) Periwound Skin Texture Texture Color No  Abnormalities Noted: No No Abnormalities Noted: No Callus: No Atrophie Blanche: No Crepitus: No Cyanosis: No Excoriation: No Ecchymosis: No Induration: No Erythema: No Rash: Yes Hemosiderin Staining: No Scarring: No Mottled: No Pallor: No Moisture Rubor: No No Abnormalities Noted: No Wound Preparation Ulcer Cleansing: Rinsed/Irrigated with Saline Topical Anesthetic Applied: None, Other: lidocaine 4%, Treatment Notes Wound #1 (Left Axilla) Notes Packed with silvercell, carboflex secured with ABD and tape Electronic Signature(s) Signed: 08/09/2018 4:40:10 PM By: Secundino Ginger Entered By: Secundino Ginger on 08/09/2018 15:43:43 [name] ([id number]) -------------------------------------------------------------------------------- Vitals Details Patient Name: [name] Date of Service: 08/09/2018 3:00 PM Medical Record Number: [id number] Patient Account Number: [id number] Date of Birth/Sex: 08-14-1951 (67 y.o. M) Treating RN: Cornell Barman Primary Care Ariam Mol: Alvera Singh Other Clinician: Referring Judene Logue: Alvera Singh Treating Ardel Jagger/Extender: Melburn Hake, HOYT Weeks in Treatment: 7 Vital Signs Time Taken: 15:30 Temperature (F): 98.2 Height (in): 71 Pulse (bpm): 63 Weight (lbs): 185.6 Respiratory Rate (breaths/min): 16 Body Mass Index (BMI): 25.9 Blood Pressure (mmHg): 113/55 Reference Range: 80 - 120 mg / dl Electronic Signature(s) Signed: 08/09/2018 4:57:35 PM By: Lorine Bears RCP, RRT, CHT Entered By: Becky Sax, Amado Nash on 08/09/2018 15:32:08

## 2018-08-16 NOTE — Progress Notes (Signed)
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  INTERVAL HISTORY: Patient returns to clinic today for further evaluation, repeat laboratory work, and continuation of Zelboraf.  He decreased his dose to 480 mg twice per day for several weeks surrounding his daughter's wedding.  He has now increased back to 720 mg twice per day and is tolerating this well. He continues to have chronic weakness and fatigue.  He does not complain of significant pain today.  He has no neurologic complaints. He denies any recent fevers or illnesses. He has no chest pain, cough, or hemoptysis. He denies any nausea, vomiting, constipation, or diarrhea. He has no melena or hematochezia. He has no urinary complaints.  Patient offers no further specific complaints today.  REVIEW OF SYSTEMS:   Review of Systems  Constitutional: Positive for malaise/fatigue. Negative for fever and weight loss.  Respiratory: Negative.  Negative for cough and shortness of breath.   Cardiovascular: Negative.  Negative for chest pain and leg swelling.  Gastrointestinal: Negative.  Negative for abdominal pain, blood in stool, melena, nausea and vomiting.  Genitourinary: Negative.  Negative for dysuria.  Musculoskeletal: Negative.  Negative for myalgias.  Skin: Negative.  Negative for rash.  Neurological: Positive for weakness. Negative for sensory change and focal weakness.  Psychiatric/Behavioral: Negative.  The patient is not nervous/anxious.     As per HPI. Otherwise, a complete review of systems is negative.  PAST MEDICAL HISTORY: Past Medical History:  Diagnosis Date  . Anemia   . Arthritis   . Cancer ([place])   . Complication of anesthesia   . PONV (postoperative nausea and  vomiting)     PAST SURGICAL HISTORY: Past Surgical History:  Procedure Laterality Date  . AXILLARY LYMPH NODE DISSECTION Left 06/26/2017   Procedure: EXCISION OF LEFT AXILLARY MASS;  Surgeon: Johnathan Hausen, MD;  Location: [facility];  Service: General;  Laterality: Left;  . DIAGNOSTIC LAPAROSCOPY    . EMBOLIZATION Left 05/11/2018   Procedure: EMBOLIZATION;  Surgeon: Katha Cabal, MD;  Location: [facility];  Service: Cardiovascular;  Laterality: Left;  . LAPAROTOMY N/A 10/14/2017   Procedure: EXPLORATORY LAPAROTOMY;  Surgeon: Clayburn Pert, MD;  Location: [facility];  Service: General;  Laterality: N/A;  . VASECTOMY      FAMILY HISTORY: Family History  Problem Relation Age of Onset  . Diabetes Mother   . Hypertension Mother   . Leukemia Father   . Thyroid disease Sister   . Liver disease Maternal Grandmother   . Heart Problems Maternal Grandfather   . Diabetes Maternal Grandfather   . Lung cancer Paternal Grandmother   . Prostate cancer Paternal Grandfather     ADVANCED DIRECTIVES (Y/N):  N  HEALTH MAINTENANCE: Social History   Tobacco Use  . Smoking status: Never Smoker  . Smokeless tobacco: Never Used  Substance Use Topics  . Alcohol use: No  . Drug use: No     Colonoscopy:  PAP:  Bone density:  Lipid panel:  No Known Allergies  Current Outpatient Medications  Medication Sig Dispense Refill  . cephALEXin (KEFLEX) 500 MG capsule TK 1 C PO TID  0  . Multiple Vitamin (MULTIVITAMIN) tablet Take 1 tablet by mouth daily.    Marland Kitchen ZELBORAF 240 MG tablet TAKE 3 TABLETS (720 MG TOTAL)  BY MOUTH EVERY 12 HOURS. 180 tablet 2  . Oxycodone HCl 10 MG TABS Take 1 tablet (10 mg total) by mouth every 6 (six) hours as needed. (Patient not taking: Reported on 07/21/2018) 60 tablet 0   No current facility-administered medications for this visit.     OBJECTIVE: Vitals:   08/19/18 1032  BP: (!) 106/55  Pulse: 73  Resp: 18  Temp: (!) 97.2 F (36.2 C)     Body  mass index is 23.75 kg/m.    ECOG FS:0 - Asymptomatic  General: Well-developed, well-nourished, no acute distress. Eyes: Pink conjunctiva, anicteric sclera. HEENT: Normocephalic, moist mucous membranes. Lungs: Clear to auscultation bilaterally. Heart: Regular rate and rhythm. No rubs, murmurs, or gallops. Abdomen: Soft, nontender, nondistended. No organomegaly noted, normoactive bowel sounds. Musculoskeletal: No edema, cyanosis, or clubbing. Neuro: Alert, answering all questions appropriately. Cranial nerves grossly intact. Skin: No rashes or petechiae noted. Psych: Normal affect. Lymphatics: Axillary lymph node significantly reduced in size.  Open lesion healing with dressing in place.  LAB RESULTS:  Lab Results  Component Value Date   NA 139 08/19/2018   K 3.7 08/19/2018   CL 103 08/19/2018   CO2 26 08/19/2018   GLUCOSE 136 (H) 08/19/2018   BUN 13 08/19/2018   CREATININE 1.24 08/19/2018   CALCIUM 8.8 (L) 08/19/2018   PROT 7.6 08/19/2018   ALBUMIN 3.7 08/19/2018   AST 14 (L) 08/19/2018   ALT 10 08/19/2018   ALKPHOS 54 08/19/2018   BILITOT 0.8 08/19/2018   GFRNONAA 58 (L) 08/19/2018   GFRAA >60 08/19/2018    Lab Results  Component Value Date   WBC 5.4 08/19/2018   NEUTROABS 4.5 08/19/2018   HGB 9.1 (L) 08/19/2018   HCT 31.1 (L) 08/19/2018   MCV 69.4 (L) 08/19/2018   PLT 362 08/19/2018     STUDIES: No results found.  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  PLAN:    1. 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  Continue Zelboraf 720 mg twice per day.  Return to clinic in 1 month for repeat laboratory work and further evaluation.  Plan to reimage in January 2020.   2.  Left axilla: PET scan results as  above.  Significantly improved in size.  Patient completed XRT on June 04, 2018.  Appreciate vascular surgical input to reduce bleeding.   3.  Left arm swelling: Significantly improved.  Ultrasound negative for DVT.  Continue follow-up with lymphedema clinic. 4.  Poor appetite: Improved.  Continue Megace as prescribed.  Appreciate dietary input. 5.  Anemia: Patient's hemoglobin is unchanged at 9.1.  Monitor. 6.  Left posterior back lesion: Appears to be infectious in origin, but a metastatic deposit is not ruled out.  Patient reports dermatology did biopsy the lesion last week and results are pending.  Continue antibiotics as prescribed.  Appreciate dermatology input.    Patient expressed understanding and was in agreement with this plan. He also understands that He can call clinic at any time with any questions, concerns, or complaints.   Cancer Staging Metastatic melanoma (HCC) Staging form: Melanoma of the Skin, AJCC 8th Edition - Clinical stage from 07/15/2017: Stage IV (cTX, cN1, cM1c(0)) - Signed by Finnegan, Timothy J, MD on 07/15/2017   Timothy J Finnegan, MD   08/21/2018 7:59 AM     

## 2018-08-17 ENCOUNTER — Ambulatory Visit: Payer: Medicare Other | Admitting: Occupational Therapy

## 2018-08-17 DIAGNOSIS — E8989 Other postprocedural endocrine and metabolic complications and disorders: Secondary | ICD-10-CM | POA: Diagnosis not present

## 2018-08-17 DIAGNOSIS — C44529 Squamous cell carcinoma of skin of other part of trunk: Secondary | ICD-10-CM | POA: Diagnosis not present

## 2018-08-17 DIAGNOSIS — I890 Lymphedema, not elsewhere classified: Secondary | ICD-10-CM

## 2018-08-17 DIAGNOSIS — Z85828 Personal history of other malignant neoplasm of skin: Secondary | ICD-10-CM | POA: Diagnosis not present

## 2018-08-18 NOTE — Therapy (Signed)
[place] MAIN [facility] SERVICES [address] [place], [place], [address] Phone: [phone]   Fax:  [phone]  Occupational Therapy Treatment  Patient Details  Name: Louis Davies MRN: [id number] Date of Birth: Aug 16, 1951 No data recorded  Encounter Date: 08/17/2018  OT End of Session - 08/17/18 1137    Visit Number  15    Number of Visits  36    Date for OT Re-Evaluation  09/27/18    OT Start Time  0215    OT Stop Time  0315    OT Time Calculation (min)  60 min    Activity Tolerance  Patient tolerated treatment well;Patient limited by fatigue;No increased pain    Behavior During Therapy  WFL for tasks assessed/performed       Past Medical History:  Diagnosis Date  . Anemia   . Arthritis   . Cancer ([place])   . Complication of anesthesia   . PONV (postoperative nausea and vomiting)     Past Surgical History:  Procedure Laterality Date  . AXILLARY LYMPH NODE DISSECTION Left 06/26/2017   Procedure: EXCISION OF LEFT AXILLARY MASS;  Surgeon: Johnathan Hausen, MD;  Location: [facility] ORS;  Service: General;  Laterality: Left;  . DIAGNOSTIC LAPAROSCOPY    . EMBOLIZATION Left 05/11/2018   Procedure: EMBOLIZATION;  Surgeon: Katha Cabal, MD;  Location: [place] [facility];  Service: Cardiovascular;  Laterality: Left;  . LAPAROTOMY N/A 10/14/2017   Procedure: EXPLORATORY LAPAROTOMY;  Surgeon: Clayburn Pert, MD;  Location: [facility] ORS;  Service: General;  Laterality: N/A;  . VASECTOMY      There were no vitals filed for this visit.  Subjective Assessment - 08/17/18 1138    Subjective   Pt presents for OT visit 15/36 for Complete Decongestive Therapy (CDT) for melanoma-related  LUE/LUq lymphedema (LE) .  Pt brings perscribed custom compression arm sleeve and glove to session for fitting and assessment. Pt is wearing leaned , ccl 2 OTS sleeve. "My hand hasn't swollen in a couple of weeks!"    Pain Onset  --   post surgical          LYMPHEDEMA/ONCOLOGY QUESTIONNAIRE - 08/18/18 1143      Lymphedema Assessments   Lymphedema Assessments  Upper extremities      Right Upper Extremity Lymphedema   Other  RUE (dominant) limb volume measures 2031.88 ml.    Other  RUE limb volume is decreased by 5.77% since commencing OT for CDT on 07/05/2018.      Left Upper Extremity Lymphedema   Other  LUE ( Rx) limb volume measures 2111.16 ml.    Other  LUE (Rx) limb volume is decreased by 20.13% since commencing OT for CDT on 07/05/2018.    Other  LVD = 3.7%              OT Treatments/Exercises (OP) - 08/18/18 0001      ADLs   ADL Education Given  Yes      Manual Therapy   Manual Therapy  Edema management;Compression Bandaging    Edema Management  performed BLUE comparative limb volumetrics    Compression Bandaging  no wraps. fitted LUE custom, ccl 2 armsleeve and custom, ccl 2 compression glove             OT Education - 08/18/18 1140    Education Details  Pt edu for compression garment ear and care regimes and donning and doffing tecyhniques, with and without assistive devices.  Person(s) Educated  Patient    Methods  Explanation;Demonstration;Handout;Tactile cues;Verbal cues    Comprehension  Verbalized understanding;Need further instruction;Returned demonstration;Verbal cues required;Tactile cues required          OT Long Term Goals - 07/29/18 1637      OT LONG TERM GOAL #1   Title  Pt will be able to apply multi-layer short stretch compression wraps using correct gradient techniques with independently , but with extra time  to achieve optimal limb volume reduction during Intensive Phase CDT, and to return affected limb(s) , as closely as possible, to premorbid size and shape.    Baseline  Dependent    Time  2    Period  Weeks    Status  Achieved      OT LONG TERM GOAL #2   Title  Pt will demonstrate understanding of lymphedema (LE) precautions / prevention principals, including signs /  symptoms of cellulitis infection with modified independence using LE Workbook as printed reference to identify 6 precautions without verbal cues by end of 3rd  OT Rx visit.     Baseline  no knowledge    Time  3    Period  Days    Status  Achieved      OT LONG TERM GOAL #3   Title  Pt to achieve no less than 10% limb volume reduction in affected limb(s) during Intensive Phase CDT to control limb swelling, to improve tissue integrity and immune function, to improve ADLs performance and to improve functional mobility/ transfers.    Baseline  dependent    Time  12    Period  Weeks    Status  Partially Met      OT LONG TERM GOAL #4   Title  Pt will achieve 100% compliance with daily LE self-care home program components, including proper skin care, simple self-MLD, gradient compression wraps/ garments, and therapeutic exercise to ensure optimal Intensive Phase limb volume reduction to expedite compression garment/ device fitting.    Baseline  Max A    Time  12    Period  Weeks    Status  On-going      OT LONG TERM GOAL #5   Title  Pt will demonstrate competent use of assistive devices during LE self-care training to improve ability to don and doff compression garments/devices with modified assistance to ensure optimal LE self-management over time.    Baseline  Max A    Time  12    Status  On-going      OT LONG TERM GOAL #6   Title  Pt will retain optimal limb volume reductions achieved during Intensive Phase CDT with no more than 3% volume increase  without CG assistance to limit LE progression and further functional decline.    Baseline  Dependent    Time  6    Period  Months    Status  On-going            Plan - 08/17/18 1159    Clinical Impression Statement  RUE (dominant) limb volume measures 2031.88 ml. LUE ( Rx) limb volume measures 2111.16 ml. LUE (Rx) limb volume is decreased by 20.13% since commencing OT for CDT on 07/05/2018. RUE limb volume is decreased by 5.77% since  commencing OT for CDT on 07/05/2018. RUE decrease is most likely due to ongoing weight loss since commencing treatment. LLE limb volume reduction value meets and exceeds 10% reduction goal. Limb volume differential (LVD) measures 3.7% ,  L(Rx) >  R (dominant). Despite this value approaching WNL LVD, LUE remains atypically larger than dominant limb. Contributing factors to persistent lymphatic congestion in the affected LUE/LUQ include pain and ongoing inflammatory processes due to slow healing open wound in L axilla, persistent limb guarding and decreased use in resulting in decreased LUE/LUQ muscle mass and decreased muscle pumping  action in the   quadrant essential for lymphatic function.   Fitted custom L armsleeve and glove. pT REPORTS COMFORT IN GARMENTS AND IS ABLE TO DON AND DOFF THEM INDEPENDENTLY AFTER SKILLED INSTRUCTION. gLOVE APPEARS TO FIT VERY WELL WITH EXCEPTION OF THUMB STUB BEING TOO LONG. aRM SLEEVE APPEARS SLIGHTLY TOO LONG AND A  AND APPEARS SLIGHTLY TOO LOSE. dme PROVIDER UPDATED. wILL COMPLETE ASSESSMENT  at next visit and complete repeat measurements at next session PRN to adjust fit.    Occupational performance deficits (Please refer to evaluation for details):  ADL's;Leisure;Social Participation;IADL's;Rest and Sleep;Work;Other   body image   Rehab Potential  Good    OT Frequency  3x / week    OT Duration  Other (comment)   and PRN   OT Treatment/Interventions  Self-care/ADL training;Therapeutic exercise;Manual lymph drainage;Compression bandaging;Patient/family education;Other (comment);Energy conservation;Therapeutic activities;Balance training;DME and/or AE instruction;Manual Therapy;Passive range of motion    Clinical Decision Making  Multiple treatment options, significant modification of task necessary    Recommended Other Services  fit with custom, clat knit LUE compression sleeve and HOS device. Consider seamless compression glove and vest. Consider kinesiotape during  Intensive Phase    Consulted and Agree with Plan of Care  Patient       Patient will benefit from skilled therapeutic intervention in order to improve the following deficits and impairments:  Decreased endurance, Decreased mobility, Decreased skin integrity, Impaired sensation, Decreased knowledge of precautions, Decreased range of motion, Decreased scar mobility, Increased edema, Pain, Decreased activity tolerance, Decreased knowledge of use of DME, Decreased strength, Impaired flexibility, Impaired UE functional use, Decreased psychosocial skills, Decreased balance, Other (comment)(balance and gait negatively impacted by guarding  posture , impaired armswing and body asymmetry)  Visit Diagnosis: Lymphedema, not elsewhere classified    Problem List Patient Active Problem List   Diagnosis Date Noted  . Anemia 05/10/2018  . Perforated viscus   . Ileus ([place])   . Constipation 10/10/2017  . Metastatic melanoma ([place]) 07/07/2017  . Axillary mass, left 06/26/2017  . Chronic pain of left knee 12/14/2015  . Influenza vaccination declined 11/27/2015    Andrey Spearman, MS, OTR/L, [facility] 08/18/18 12:03 PM  [place] MAIN [facility] SERVICES [address] [place], [place], [address] Phone: [phone]   Fax:  [phone]  Name: Louis Davies MRN: [id number] Date of Birth: January 16, 1951

## 2018-08-19 ENCOUNTER — Encounter: Payer: Self-pay | Admitting: Oncology

## 2018-08-19 ENCOUNTER — Inpatient Hospital Stay (HOSPITAL_BASED_OUTPATIENT_CLINIC_OR_DEPARTMENT_OTHER): Payer: Medicare Other | Admitting: Oncology

## 2018-08-19 ENCOUNTER — Ambulatory Visit: Payer: Medicare Other | Admitting: Occupational Therapy

## 2018-08-19 ENCOUNTER — Inpatient Hospital Stay: Payer: Medicare Other

## 2018-08-19 VITALS — BP 106/55 | HR 73 | Temp 97.2°F | Resp 18 | Wt 170.3 lb

## 2018-08-19 DIAGNOSIS — R5383 Other fatigue: Secondary | ICD-10-CM | POA: Diagnosis not present

## 2018-08-19 DIAGNOSIS — D649 Anemia, unspecified: Secondary | ICD-10-CM

## 2018-08-19 DIAGNOSIS — Z801 Family history of malignant neoplasm of trachea, bronchus and lung: Secondary | ICD-10-CM

## 2018-08-19 DIAGNOSIS — Z79899 Other long term (current) drug therapy: Secondary | ICD-10-CM | POA: Diagnosis not present

## 2018-08-19 DIAGNOSIS — R630 Anorexia: Secondary | ICD-10-CM

## 2018-08-19 DIAGNOSIS — C439 Malignant melanoma of skin, unspecified: Secondary | ICD-10-CM | POA: Diagnosis not present

## 2018-08-19 DIAGNOSIS — R531 Weakness: Secondary | ICD-10-CM | POA: Diagnosis not present

## 2018-08-19 DIAGNOSIS — C7951 Secondary malignant neoplasm of bone: Secondary | ICD-10-CM | POA: Diagnosis not present

## 2018-08-19 DIAGNOSIS — C781 Secondary malignant neoplasm of mediastinum: Secondary | ICD-10-CM

## 2018-08-19 DIAGNOSIS — I890 Lymphedema, not elsewhere classified: Secondary | ICD-10-CM | POA: Diagnosis not present

## 2018-08-19 DIAGNOSIS — M129 Arthropathy, unspecified: Secondary | ICD-10-CM

## 2018-08-19 DIAGNOSIS — R918 Other nonspecific abnormal finding of lung field: Secondary | ICD-10-CM | POA: Diagnosis not present

## 2018-08-19 DIAGNOSIS — Z1509 Genetic susceptibility to other malignant neoplasm: Secondary | ICD-10-CM

## 2018-08-19 DIAGNOSIS — Z923 Personal history of irradiation: Secondary | ICD-10-CM | POA: Diagnosis not present

## 2018-08-19 DIAGNOSIS — Z806 Family history of leukemia: Secondary | ICD-10-CM

## 2018-08-19 DIAGNOSIS — C799 Secondary malignant neoplasm of unspecified site: Secondary | ICD-10-CM

## 2018-08-19 DIAGNOSIS — E8989 Other postprocedural endocrine and metabolic complications and disorders: Secondary | ICD-10-CM | POA: Diagnosis not present

## 2018-08-19 LAB — CBC WITH DIFFERENTIAL/PLATELET
Abs Immature Granulocytes: 0.02 10*3/uL (ref 0.00–0.07)
BASOS PCT: 1 %
Basophils Absolute: 0 10*3/uL (ref 0.0–0.1)
EOS PCT: 2 %
Eosinophils Absolute: 0.1 10*3/uL (ref 0.0–0.5)
HCT: 31.1 % — ABNORMAL LOW (ref 39.0–52.0)
Hemoglobin: 9.1 g/dL — ABNORMAL LOW (ref 13.0–17.0)
Immature Granulocytes: 0 %
Lymphocytes Relative: 8 %
Lymphs Abs: 0.4 10*3/uL — ABNORMAL LOW (ref 0.7–4.0)
MCH: 20.3 pg — AB (ref 26.0–34.0)
MCHC: 29.3 g/dL — AB (ref 30.0–36.0)
MCV: 69.4 fL — AB (ref 80.0–100.0)
MONO ABS: 0.4 10*3/uL (ref 0.1–1.0)
MONOS PCT: 7 %
Neutro Abs: 4.5 10*3/uL (ref 1.7–7.7)
Neutrophils Relative %: 82 %
PLATELETS: 362 10*3/uL (ref 150–400)
RBC: 4.48 MIL/uL (ref 4.22–5.81)
RDW: 17.2 % — ABNORMAL HIGH (ref 11.5–15.5)
WBC: 5.4 10*3/uL (ref 4.0–10.5)
nRBC: 0 % (ref 0.0–0.2)

## 2018-08-19 LAB — COMPREHENSIVE METABOLIC PANEL
ALK PHOS: 54 U/L (ref 38–126)
ALT: 10 U/L (ref 0–44)
ANION GAP: 10 (ref 5–15)
AST: 14 U/L — ABNORMAL LOW (ref 15–41)
Albumin: 3.7 g/dL (ref 3.5–5.0)
BUN: 13 mg/dL (ref 8–23)
CALCIUM: 8.8 mg/dL — AB (ref 8.9–10.3)
CO2: 26 mmol/L (ref 22–32)
CREATININE: 1.24 mg/dL (ref 0.61–1.24)
Chloride: 103 mmol/L (ref 98–111)
GFR, EST NON AFRICAN AMERICAN: 58 mL/min — AB (ref >60)
Glucose, Bld: 136 mg/dL — ABNORMAL HIGH (ref 70–99)
Potassium: 3.7 mmol/L (ref 3.5–5.1)
Sodium: 139 mmol/L (ref 135–145)
Total Bilirubin: 0.8 mg/dL (ref 0.3–1.2)
Total Protein: 7.6 g/dL (ref 6.5–8.1)

## 2018-08-19 NOTE — Therapy (Signed)
[place] MAIN [facility] SERVICES [address] [place], [place], [address] Phone: [phone]   Fax:  [phone]  Occupational Therapy Treatment  Patient Details  Name: Louis Davies MRN: [id number] Date of Birth: May 14, 1951 No data recorded  Encounter Date: 08/19/2018  OT End of Session - 08/19/18 1553    Visit Number  16    Number of Visits  36    Date for OT Re-Evaluation  09/27/18    OT Start Time  0205    OT Stop Time  0307    OT Time Calculation (min)  62 min    Activity Tolerance  Patient tolerated treatment well;Patient limited by fatigue;No increased pain    Behavior During Therapy  WFL for tasks assessed/performed       Past Medical History:  Diagnosis Date  . Anemia   . Arthritis   . Cancer ([place])   . Complication of anesthesia   . PONV (postoperative nausea and vomiting)     Past Surgical History:  Procedure Laterality Date  . AXILLARY LYMPH NODE DISSECTION Left 06/26/2017   Procedure: EXCISION OF LEFT AXILLARY MASS;  Surgeon: Johnathan Hausen, MD;  Location: [facility] ORS;  Service: General;  Laterality: Left;  . DIAGNOSTIC LAPAROSCOPY    . EMBOLIZATION Left 05/11/2018   Procedure: EMBOLIZATION;  Surgeon: Katha Cabal, MD;  Location: [place] CV LAB;  Service: Cardiovascular;  Laterality: Left;  . LAPAROTOMY N/A 10/14/2017   Procedure: EXPLORATORY LAPAROTOMY;  Surgeon: Clayburn Pert, MD;  Location: [facility] ORS;  Service: General;  Laterality: N/A;  . VASECTOMY      There were no vitals filed for this visit.  Subjective Assessment - 08/19/18 1606    Subjective   Pt presents for OT visit 16/36 for Complete Decongestive Therapy (CDT) for melanoma-related  LUE/LUq lymphedema (LE) .  Pt reports he wore custom compression sleeve fitted at last visit  with the following problems: 1- sleeve too long 2- top edge wont stay up 3- bunches at elbow causing pain 4- cuts off feeling t L wrist if worn too long.    Currently in  Pain?  Yes   L axillary wound site uncomfortable  with drainage today. Not rated numerically.   Pain Location  Axilla    Pain Orientation  Left    Pain Type  Chronic pain    Pain Onset  --   post surgical                  OT Treatments/Exercises (OP) - 08/19/18 0001      ADLs   ADL Education Given  Yes      Manual Therapy   Manual Therapy  Edema management;Manual Lymphatic Drainage (MLD);Compression Bandaging    Edema Management  completed assessment of  LLE custom compression sleeve and discussed problem list w/ vendor by phone.     Manual Lymphatic Drainage (MLD)  MLD in combination with myofacial release to LUE,/ LUQ as established    Compression Bandaging  No sleeve or glove after session. Pt to don loaned OTS sleeve when he gets home.             OT Education - 08/19/18 1611    Education Details  Cont Pt edu for garment fitting and adjustment process.    Person(s) Educated  Patient    Methods  Explanation;Demonstration;Handout;Tactile cues;Verbal cues    Comprehension  Verbalized understanding;Need further instruction;Returned demonstration;Verbal cues required;Tactile cues required  OT Long Term Goals - 07/29/18 1637      OT LONG TERM GOAL #1   Title  Pt will be able to apply multi-layer short stretch compression wraps using correct gradient techniques with independently , but with extra time  to achieve optimal limb volume reduction during Intensive Phase CDT, and to return affected limb(s) , as closely as possible, to premorbid size and shape.    Baseline  Dependent    Time  2    Period  Weeks    Status  Achieved      OT LONG TERM GOAL #2   Title  Pt will demonstrate understanding of lymphedema (LE) precautions / prevention principals, including signs / symptoms of cellulitis infection with modified independence using LE Workbook as printed reference to identify 6 precautions without verbal cues by end of 3rd  OT Rx visit.     Baseline   no knowledge    Time  3    Period  Days    Status  Achieved      OT LONG TERM GOAL #3   Title  Pt to achieve no less than 10% limb volume reduction in affected limb(s) during Intensive Phase CDT to control limb swelling, to improve tissue integrity and immune function, to improve ADLs performance and to improve functional mobility/ transfers.    Baseline  dependent    Time  12    Period  Weeks    Status  Partially Met      OT LONG TERM GOAL #4   Title  Pt will achieve 100% compliance with daily LE self-care home program components, including proper skin care, simple self-MLD, gradient compression wraps/ garments, and therapeutic exercise to ensure optimal Intensive Phase limb volume reduction to expedite compression garment/ device fitting.    Baseline  Max A    Time  12    Period  Weeks    Status  On-going      OT LONG TERM GOAL #5   Title  Pt will demonstrate competent use of assistive devices during LE self-care training to improve ability to don and doff compression garments/devices with modified assistance to ensure optimal LE self-management over time.    Baseline  Max A    Time  12    Status  On-going      OT LONG TERM GOAL #6   Title  Pt will retain optimal limb volume reductions achieved during Intensive Phase CDT with no more than 3% volume increase  without CG assistance to limit LE progression and further functional decline.    Baseline  Dependent    Time  6    Period  Months    Status  On-going            Plan - 08/19/18 1612    Clinical Impression Statement  Custom, ccl 2, Elvarex compression sleeve is too long and too large circumferentially, despite anatomical measurements taken at last visit matching up , or being even slightly smaller at most landmarks. Sleeve bunched at antecubital fossa as top edge will not stay in place, causing discomfort at elbow and swelling in forarm. Pt also c/o numbness in digits 3 and 4 when wearing sle4ve too long yesterday. Glove  needs replacement as thumb stub is ~ 2 cm too long. Vendor notified and they will investigate with manufacturer and remakes. Provided MLD  for remainder of session. Pt to don  loaned OTS sleeve at home. Cont as per POC.    Occupational  performance deficits (Please refer to evaluation for details):  ADL's;Leisure;Social Participation;IADL's;Rest and Sleep;Work;Other   body image   Rehab Potential  Good    OT Frequency  3x / week    OT Duration  Other (comment)   and PRN   OT Treatment/Interventions  Self-care/ADL training;Therapeutic exercise;Manual lymph drainage;Compression bandaging;Patient/family education;Other (comment);Energy conservation;Therapeutic activities;Balance training;DME and/or AE instruction;Manual Therapy;Passive range of motion    Clinical Decision Making  Multiple treatment options, significant modification of task necessary    Recommended Other Services  fit with custom, clat knit LUE compression sleeve and HOS device. Consider seamless compression glove and vest. Consider kinesiotape during Intensive Phase    Consulted and Agree with Plan of Care  Patient       Patient will benefit from skilled therapeutic intervention in order to improve the following deficits and impairments:  Decreased endurance, Decreased mobility, Decreased skin integrity, Impaired sensation, Decreased knowledge of precautions, Decreased range of motion, Decreased scar mobility, Increased edema, Pain, Decreased activity tolerance, Decreased knowledge of use of DME, Decreased strength, Impaired flexibility, Impaired UE functional use, Decreased psychosocial skills, Decreased balance, Other (comment)(balance and gait negatively impacted by guarding  posture , impaired armswing and body asymmetry)  Visit Diagnosis: Lymphedema, not elsewhere classified    Problem List Patient Active Problem List   Diagnosis Date Noted  . Anemia 05/10/2018  . Perforated viscus   . Ileus ([place])   . Constipation  10/10/2017  . Metastatic melanoma ([place]) 07/07/2017  . Axillary mass, left 06/26/2017  . Chronic pain of left knee 12/14/2015  . Influenza vaccination declined 11/27/2015    Andrey Spearman, MS, OTR/L, [facility] 08/19/18 4:17 PM  [place] MAIN [facility] SERVICES [address] [place], [place], [address] Phone: [phone]   Fax:  [phone]  Name: Louis Davies MRN: [id number] Date of Birth: [birth year]

## 2018-08-19 NOTE — Progress Notes (Signed)
Patient here today for follow up regarding metastatic melanoma. Patient reports improvement in open wound as well as lymphedema. Patient still following with wound clinic and lymphedema clinic.

## 2018-08-20 LAB — THYROID PANEL WITH TSH
Free Thyroxine Index: 1.7 (ref 1.2–4.9)
T3 Uptake Ratio: 31 % (ref 24–39)
T4 TOTAL: 5.5 ug/dL (ref 4.5–12.0)
TSH: 3.31 u[IU]/mL (ref 0.450–4.500)

## 2018-08-21 NOTE — Progress Notes (Signed)
VARUN, JOURDAN ([id number]) Visit Report for 08/09/2018 Chief Complaint Document Details Patient Name: Louis Davies, Louis Davies Date of Service: 08/09/2018 3:00 PM Medical Record Number: [id number] Patient Account Number: [id number] Date of Birth/Sex: October 02, 1951 (66 y.o. M) Treating RN: Cornell Barman Primary Care Provider: Alvera Singh Other Clinician: Referring Provider: Alvera Singh Treating Provider/Extender: Melburn Hake, Beau Vanduzer Weeks in Treatment: 7 Information Obtained from: Patient Chief Complaint Left axillary metastatic ulcer Electronic Signature(s) Signed: 08/13/2018 8:35:57 AM By: Worthy Keeler PA-C Entered By: Worthy Keeler on 08/09/2018 15:11:41 Louis Davies ([id number]) -------------------------------------------------------------------------------- HPI Details Patient Name: Louis Davies Date of Service: 08/09/2018 3:00 PM Medical Record Number: [id number] Patient Account Number: [id number] Date of Birth/Sex: 07/04/1951 (66 y.o. 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 The patient following excision did undergo radiation therapy which he has completed at this point. He is still on oral chemotherapy at this time however. Patient states that he does have some discomfort at the site but one of the biggest issues is actually the malodorous smell that is emanating from the wound that he states make some very self-conscious. He also did have an infection recently that he just completed oral antibiotic therapy for he was unsure of the antibiotic that he was on. He  states that just ended yesterday. Nonetheless No fevers, chills, nausea, or vomiting noted at this time. 06/22/18 on evaluation today patient's ulcer in the left axillary region actually has a fairly strong odor even I feel more so than last week at this point. He has been tolerating the dressing changes without complication. With that being said I think we may want to culture this to ensure there is no infection I know he's recently been on antibiotics before I first saw him but nonetheless I feel like this is going to be likely necessary in order to try to help control some of the older especially if it's coming from an infection 06/28/18 on evaluation today patient appears to be doing a little better in my pinion in regard to the drainage from the left axillary ulcer. He's been tolerating the dressing changes without complication. With that being said I do have his culture for review today which shows that it was positive for Enterococcus faecalis and is sensitive to ampicillin. With that being said he seems to be tolerating the dressing changes very well and in general I am pleased with what I'm seeing. I'm unsure of how much of the cancer may be remaining the patient also states that he will be having a pet scan at the end of this month on the 30th he believes. 07/12/18 on evaluation today patient actually appears to be doing a little better in regard to his ulcer and the left axillary region. He's been tolerating the dressing changes without complication the odor is better I do believe that the antibiotic has been of some benefit in this regard. Fortunately there's no evidence of overt infection at this time. 08/09/18 on evaluation today patient actually appears to be doing a little better in regard to the ulcer in the left axillary region. At this point I did review his oncology notes and he has completed radiation  therapy and apparently this cancerous lesion seems to be doing decently well.  Obviously that is worse on the necrotic tissue and odor may be coming from as far as the chemotherapy the amount of tissue that has been destroyed is concerned inside the wound itself. Fortunately there does not appear to be evidence of infection and the owner itself is not as significant as was previous. Electronic Signature(s) Signed: 08/13/2018 8:35:57 AM By: Worthy Keeler PA-C Entered By: Worthy Keeler on 08/13/2018 00:19:50 Louis Davies ([id number]) -------------------------------------------------------------------------------- Physical Exam Details Patient Name: Louis Davies Date of Service: 08/09/2018 3:00 PM Medical Record Number: [id number] Patient Account Number: [id number] Date of Birth/Sex: 1950/11/01 (66 y.o. M) Treating RN: Cornell Barman Primary Care Provider: Alvera Singh Other Clinician: Referring Provider: Alvera Singh Treating Provider/Extender: STONE III, Rehanna Oloughlin Weeks in Treatment: 7 Constitutional Well-nourished and well-hydrated in no acute distress. Respiratory normal breathing without difficulty. clear to auscultation bilaterally. Cardiovascular regular rate and rhythm with normal S1, S2. Psychiatric this patient is able to make decisions and demonstrates good insight into disease process. Alert and Oriented x 3. pleasant and cooperative. Notes Patient's wound bed currently shows evidence of good granulation which is excellent news. There does not appear to be any evidence of infection at this time. Fortunately I believe the patient is showing signs of great improvement as far as the overall wound is concerned although obviously this is very different scenario than most wounds and that it's probably still going to give him some trouble for some time yet. Electronic Signature(s) Signed: 08/13/2018 8:35:57 AM By: Worthy Keeler PA-C Entered By: Worthy Keeler on 08/13/2018 00:20:31 Louis Davies  ([id number]) -------------------------------------------------------------------------------- Physician Orders Details Patient Name: Louis Davies Date of Service: 08/09/2018 3:00 PM Medical Record Number: [id number] Patient Account Number: [id number] Date of Birth/Sex: 1950-12-11 (66 y.o. 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 o May Shower, gently pat wound dry prior to applying new dressing. - wash with hibiclens Anesthetic (add to Medication List) Wound #1 Left Axilla o Topical Lidocaine 4% cream applied to wound bed prior to debridement (In Clinic Only). Skin Barriers/Peri-Wound Care Wound #1 Left Axilla o Skin Prep Primary Wound Dressing Wound #1 Left Axilla o Silver Alginate - rope Secondary Dressing o ABD pad o Other - carboflex Dressing Change Frequency Wound #1 Left Axilla o Change dressing every day. Follow-up Appointments Wound #1 Left Axilla o Return Appointment in 2 weeks. Additional Orders / Instructions Wound #1 Left Axilla o Increase protein intake. Electronic Signature(s) Signed: 08/09/2018 6:05:50 PM By: Gretta Cool, BSN, RN, CWS, Kim RN, BSN Louis Davies ([id number]) Signed: 08/13/2018 8:35:57 AM By: Irean Hong Entered By: Gretta Cool BSN, RN, CWS, Kim on 08/09/2018 16:04:45 Louis Davies ([id number]) -------------------------------------------------------------------------------- Problem List Details Patient Name: Louis Davies Date  of Service: 08/09/2018 3:00 PM Medical Record Number: [id number] Patient Account Number: [id number] Date of Birth/Sex: 01/29/1951 (66 y.o. 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 The patient following excision did undergo radiation therapy which he has completed at this point. He is still on oral chemotherapy at this time however. Patient states that he does have some discomfort at the site but one of the biggest issues is actually the  malodorous smell that is emanating from the wound that he states make some very self-conscious. He also did have an infection recently that he just completed oral antibiotic therapy for he was unsure of the antibiotic that he was on. He states that just ended yesterday. Nonetheless No fevers, chills, nausea, or vomiting noted at this time. 06/22/18 on evaluation today patient's ulcer in the left axillary region actually has a fairly strong odor even I feel more so than last week at this point. He has been tolerating the dressing changes without complication. With that being said I think we may want to culture this to ensure there is no infection I know he's recently been on antibiotics before I first saw him but nonetheless I feel like this is going to be likely necessary in order to try to help control some of the older especially if it's coming from an infection 06/28/18 on evaluation today patient appears to be doing a little better in my pinion in regard to the drainage from the left axillary ulcer. He's been tolerating the dressing changes without complication. With that being said I do have his culture for review today which shows that it was positive for Enterococcus faecalis and is sensitive to ampicillin. With that being said he seems to be tolerating the dressing changes very well and in general I am pleased with what I'm seeing. I'm unsure of how much of the cancer may be remaining the patient also states that he will be having a pet scan at the end of this month on the 30th he believes. 07/12/18 on evaluation today patient actually appears to be doing a little better in regard to his ulcer and the left axillary region. He's been tolerating the dressing changes without complication the odor is better I do believe that the antibiotic has been of some benefit in this regard. Fortunately there's no evidence of overt infection at this time. 08/09/18 on evaluation today patient actually appears to  be doing a little better in regard to the ulcer in the left axillary region. At this point I did review his oncology notes and he has completed radiation therapy and apparently this cancerous lesion seems to be doing decently well. Obviously  that is worse on the necrotic tissue and odor may be coming from as far as the chemotherapy the amount of tissue that has been destroyed is concerned inside the wound itself. Fortunately there does not appear to be evidence of infection and the owner itself is not as significant as was previous. Patient History Information obtained from Patient. Family History Cancer - Father, Diabetes - Mother, Hypertension - Mother, No family history of Heart Disease, Kidney Disease, Lung Disease, Stroke, Thyroid Problems, Tuberculosis. Social History Never smoker, Marital Status - Divorced, Alcohol Use - Never, Drug Use - No History, Caffeine Use - Rarely. Louis Davies, Louis Davies ([id number]) Medical And Surgical History Notes Constitutional Symptoms (General Health) Radiation and Chemo for Metastatic Melanoma Review of Systems (ROS) Constitutional Symptoms (General Health) Denies complaints or symptoms of Fever, Chills. Respiratory The patient has no complaints or symptoms. Cardiovascular The patient has no complaints or symptoms. Psychiatric The patient has no complaints or symptoms. Objective Constitutional Well-nourished and well-hydrated in no acute distress. Vitals Time Taken: 3:30 PM, Height: 71 in, Weight: 185.6 lbs, BMI: 25.9, Temperature: 98.2 F, Pulse: 63 bpm, Respiratory Rate: 16 breaths/min, Blood Pressure: 113/55 mmHg. Respiratory normal breathing without difficulty. clear to auscultation bilaterally. Cardiovascular regular rate and rhythm with normal S1, S2. Psychiatric this patient is able to make decisions and demonstrates good insight into disease process. Alert and Oriented x 3. pleasant and cooperative. General Notes: Patient's wound bed  currently shows evidence of good granulation which is excellent news. There does not appear to be any evidence of infection at this time. Fortunately I believe the patient is showing signs of great improvement as far as the overall wound is concerned although obviously this is very different scenario than most wounds and that it's probably still going to give him some trouble for some time yet. Integumentary (Hair, Skin) Wound #1 status is Open. Original cause of wound was Gradually Appeared. The wound is located on the Left Axilla. The wound measures 0.5cm length x 0.9cm width x 2.9cm depth; 0.353cm^2 area and 1.025cm^3 volume. There is Fat Layer (Subcutaneous Tissue) Exposed exposed. There is no tunneling or undermining noted. There is a large amount of serosanguineous drainage noted. The wound margin is flat and intact. There is no granulation within the wound bed. There is a large (67-100%) amount of necrotic tissue within the wound bed including Adherent Slough. The periwound skin appearance exhibited: Rash. The periwound skin appearance did not exhibit: Callus, Crepitus, Excoriation, Induration, Scarring, Atrophie Blanche, Cyanosis, Ecchymosis, Hemosiderin Staining, Mottled, Pallor, Rubor, Erythema. 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 May Shower, gently pat wound dry prior to applying new dressing. - wash with hibiclens Anesthetic (add to Medication List): Wound #1 Left Axilla: Topical Lidocaine 4% cream applied to wound bed prior to debridement (In Clinic Only). Skin Barriers/Peri-Wound Care: Wound #1 Left Axilla: Skin Prep Primary Wound Dressing: Wound #1 Left Axilla: Silver Alginate -  rope Secondary Dressing: ABD pad Other - carboflex Dressing Change Frequency: Wound #1 Left Axilla: Change dressing every day. Follow-up Appointments: Wound #1 Left Axilla: Return Appointment in 2 weeks. Additional Orders / Instructions: Wound #1 Left Axilla: Increase protein intake. At this point my recommendation is going to be that we continue with the above wound care measures for  the next week. The patient is in agreement with plan. We will subsequently see were things stand at follow-up. Please see above for specific wound care orders. We will see patient for re-evaluation in 2 week(s) here in the clinic. If anything worsens or changes patient will contact our office for additional recommendations. Electronic Signature(s) Signed: 08/13/2018 8:35:57 AM By: Worthy Keeler PA-C Entered By: Worthy Keeler on 08/13/2018 00:21:18 Louis Davies ([id number] Davies ([id number]) -------------------------------------------------------------------------------- ROS/PFSH Details Patient Name: Louis Davies Date of Service: 08/09/2018 3:00 PM Medical Record Number: [id number] Patient Account Number: [id number] Date of Birth/Sex: April 17, 1951 (66 y.o. M) Treating RN: Cornell Barman Primary Care Provider: Alvera Singh Other Clinician: Referring Provider: Alvera Singh Treating Provider/Extender: Melburn Hake, Mimi Debellis Weeks in Treatment: 7 Information Obtained From Patient Wound History Do you currently have one or more open woundso Yes How many open wounds do you currently haveo 1 Approximately how long have you had your woundso 2 months How have you been treating your wound(s) until nowo bandage Has your wound(s) ever healed and then re-openedo No Have you had any lab work done in the past montho Yes Have you tested positive for an antibiotic resistant organism (MRSA, VRE)o No Have you tested positive for osteomyelitis (bone infection)o No Have you had any tests for  circulation on your legso No Have you had other problems associated with your woundso Infection, Swelling Constitutional Symptoms (General Health) Complaints and Symptoms: Negative for: Fever; Chills Medical History: Past Medical History Notes: Radiation and Chemo for Metastatic Melanoma Eyes Medical History: Negative for: Cataracts; Glaucoma; Optic Neuritis Ear/Nose/Mouth/Throat Medical History: Negative for: Chronic sinus problems/congestion; Middle ear problems Hematologic/Lymphatic Medical History: Negative for: Anemia; Hemophilia; Human Immunodeficiency Virus; Lymphedema; Sickle Cell Disease Respiratory Complaints and Symptoms: No Complaints or Symptoms Medical History: Negative for: Aspiration; Asthma; Chronic Obstructive Pulmonary Disease (COPD); Pneumothorax; Sleep Apnea; Tuberculosis Cardiovascular Louis Davies, Louis Davies ([id number]) Complaints and Symptoms: No Complaints or Symptoms Medical History: Negative for: Angina; Arrhythmia; Congestive Heart Failure; Coronary Artery Disease; Deep Vein Thrombosis; Hypertension; Hypotension; Myocardial Infarction; Peripheral Arterial Disease; Peripheral Venous Disease; Phlebitis; Vasculitis Gastrointestinal Medical History: Negative for: Cirrhosis ; Colitis; Crohnos; Hepatitis A; Hepatitis B; Hepatitis C Endocrine Medical History: Negative for: Type I Diabetes; Type II Diabetes Genitourinary Medical History: Negative for: End Stage Renal Disease Immunological Medical History: Negative for: Lupus Erythematosus; Raynaudos; Scleroderma Integumentary (Skin) Medical History: Negative for: History of Burn; History of pressure wounds Musculoskeletal Medical History: Positive for: Osteoarthritis - left knee Negative for: Gout; Rheumatoid Arthritis; Osteomyelitis Neurologic Medical History: Negative for: Dementia; Neuropathy; Quadriplegia; Paraplegia; Seizure Disorder Oncologic Medical History: Positive for: Received  Chemotherapy; Received Radiation - Axillary Psychiatric Complaints and Symptoms: No Complaints or Symptoms Medical History: Negative for: Anorexia/bulimia; Confinement Anxiety Immunizations Pneumococcal VaccineDONTRELL, Louis Davies ([id number]) Received Pneumococcal Vaccination: Yes Implantable Devices Family and Social History Cancer: Yes - Father; Diabetes: Yes - Mother; Heart Disease: No; Hypertension: Yes - Mother; Kidney Disease: No; Lung Disease: No; Stroke: No; Thyroid Problems: No; Tuberculosis: No; Never smoker; Marital Status - Divorced; Alcohol Use: Never; Drug Use: No History; Caffeine Use: Rarely; Living Will: Yes (Copy provided) Physician Affirmation I have reviewed and agree with the above information. Electronic Signature(s) Signed: 08/13/2018 8:35:57 AM By: Worthy Keeler PA-C Signed: 08/20/2018 3:46:30 PM By: Gretta Cool, BSN, RN, CWS, Kim RN, BSN Entered By: Worthy Keeler on 08/13/2018 00:20:09 Louis Davies ([id number]) -------------------------------------------------------------------------------- SuperBill Details Patient Name: Louis Davies Date of Service: 08/09/2018 Medical Record Number: [id number]  Patient Account Number: [id number] Date of Birth/Sex: Oct 31, 1950 (67 y.o. 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

## 2018-08-23 ENCOUNTER — Ambulatory Visit: Payer: Medicare Other | Attending: Oncology | Admitting: Occupational Therapy

## 2018-08-23 ENCOUNTER — Inpatient Hospital Stay: Payer: Medicare Other | Attending: Oncology | Admitting: Hospice and Palliative Medicine

## 2018-08-23 ENCOUNTER — Encounter: Payer: Medicare Other | Attending: Physician Assistant | Admitting: Physician Assistant

## 2018-08-23 ENCOUNTER — Encounter: Payer: Self-pay | Admitting: Hospice and Palliative Medicine

## 2018-08-23 VITALS — BP 134/78 | HR 79 | Temp 99.3°F | Resp 16

## 2018-08-23 DIAGNOSIS — E8989 Other postprocedural endocrine and metabolic complications and disorders: Secondary | ICD-10-CM | POA: Diagnosis not present

## 2018-08-23 DIAGNOSIS — L98495 Non-pressure chronic ulcer of skin of other sites with muscle involvement without evidence of necrosis: Secondary | ICD-10-CM | POA: Insufficient documentation

## 2018-08-23 DIAGNOSIS — Z79899 Other long term (current) drug therapy: Secondary | ICD-10-CM | POA: Insufficient documentation

## 2018-08-23 DIAGNOSIS — G893 Neoplasm related pain (acute) (chronic): Secondary | ICD-10-CM

## 2018-08-23 DIAGNOSIS — C773 Secondary and unspecified malignant neoplasm of axilla and upper limb lymph nodes: Secondary | ICD-10-CM | POA: Diagnosis not present

## 2018-08-23 DIAGNOSIS — L98492 Non-pressure chronic ulcer of skin of other sites with fat layer exposed: Secondary | ICD-10-CM | POA: Diagnosis not present

## 2018-08-23 DIAGNOSIS — Z515 Encounter for palliative care: Secondary | ICD-10-CM | POA: Insufficient documentation

## 2018-08-23 DIAGNOSIS — C439 Malignant melanoma of skin, unspecified: Secondary | ICD-10-CM | POA: Diagnosis not present

## 2018-08-23 DIAGNOSIS — C7951 Secondary malignant neoplasm of bone: Secondary | ICD-10-CM | POA: Diagnosis not present

## 2018-08-23 DIAGNOSIS — C781 Secondary malignant neoplasm of mediastinum: Secondary | ICD-10-CM | POA: Diagnosis not present

## 2018-08-23 DIAGNOSIS — Z923 Personal history of irradiation: Secondary | ICD-10-CM | POA: Insufficient documentation

## 2018-08-23 DIAGNOSIS — C799 Secondary malignant neoplasm of unspecified site: Secondary | ICD-10-CM

## 2018-08-23 DIAGNOSIS — I890 Lymphedema, not elsewhere classified: Secondary | ICD-10-CM | POA: Insufficient documentation

## 2018-08-23 DIAGNOSIS — M1990 Unspecified osteoarthritis, unspecified site: Secondary | ICD-10-CM | POA: Insufficient documentation

## 2018-08-23 NOTE — Progress Notes (Signed)
[facility]  Telephone:([phone] [phone]   Name: Louis Davies Date: 08/23/2018 MRN: [id number]  DOB: 1951/07/08  Patient Care Team: Alvera Singh, FNP as PCP - General (Family Medicine)    REASON FOR CONSULTATION: Palliative Care consult requested for this 67 y.o. 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  Previous PET scan results from September 2019 reveal improved disease.  Patient's care has been complicated by left extremity swelling managed by lymphedema clinic and left axilla wound managed at the wound center.  Palliative care was asked to help address goals of care.  SOCIAL HISTORY:    Patient lives alone.  He has a daughter.  He works as a Games developer.  ADVANCE DIRECTIVES:  Unknown  CODE STATUS: Full code  PAST MEDICAL HISTORY: Past Medical History:  Diagnosis Date  . Anemia   . Arthritis   . Cancer ([place])   . Complication of anesthesia   . PONV (postoperative nausea and vomiting)     PAST SURGICAL HISTORY:  Past Surgical History:  Procedure Laterality Date  . AXILLARY LYMPH NODE DISSECTION Left 06/26/2017   Procedure: EXCISION OF LEFT AXILLARY MASS;  Surgeon: Johnathan Hausen, MD;  Location: [facility] ORS;  Service: General;  Laterality: Left;  . DIAGNOSTIC LAPAROSCOPY    . EMBOLIZATION Left 05/11/2018   Procedure: EMBOLIZATION;  Surgeon: Katha Cabal, MD;  Location: [place] [facility];  Service: Cardiovascular;  Laterality: Left;  . LAPAROTOMY N/A 10/14/2017   Procedure: EXPLORATORY LAPAROTOMY;  Surgeon: Clayburn Pert, MD;  Location: [facility] ORS;  Service: General;  Laterality: N/A;  . VASECTOMY      HEMATOLOGY/ONCOLOGY HISTORY:  Oncology History   Patient initially  noticed an enlarging nodule in his left axilla approximately in  July 2018. Subsequent excisional biopsy revealed metastatic melanoma. Patient had a lesion removed from his left back back in 2016 but otherwise has noted no other problems.  Had Pet Scan in Sept 2019 revealing metastatic disease. BRAF mutation is positive.    MRI of Brain revealed no metastatic disease.   Began with  immunotherapy using ipilumimab and nivolumab every 3 weeks for 4 cycles and then continue with maintenance nivomumab every 2 weeks until progression of disease.  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.   D/c immunotherapy (10/2017)  Started on 960 mg Zelboraf (10/2017)  daily BRAF mutation is positive.  Dose reduced to 781m BID in March 2019 d/t side effects including nausea, vomiting, weight loss and abdominal bloating.   [place].  Restarted at reduced dose of 480 mg in May 2019.  Had restaging PET scan may 2019 revealed esponse to therapy, as evidenced by decreased size and hypermetabolism of dominant left axillary mass. Other thoracic nodes are improved and resolved in hypermetabolism as detailed above. 2. Decreased hypermetabolism within indeterminate left iliac lesion  On 04/26/18 patient noted enlarging axiallry mass and was concerned. CT chest ordered revelaing a large left axillary mass that had dramatically increased in size, measuring over 5 times the volume that it did on 03/04/2018. Appearance compatible with rapid growth of malignancy.           [place]   07/07/2017 Initial Diagnosis    [place]ast[place]  ALLERGIES:  has No Known Allergies.  MEDICATIONS:  Current Outpatient Medications  Medication Sig Dispense Refill  . cephALEXin (KEFLEX) 500 MG capsule Take 500 mg by mouth 3 (three) times daily.    . Multiple Vitamin (MULTIVITAMIN) tablet Take 1 tablet by mouth daily.    Marland Kitchen ZELBORAF 240 MG  tablet TAKE 3 TABLETS (720 MG TOTAL) BY MOUTH EVERY 12 HOURS. 180 tablet 2  . Oxycodone HCl 10 MG TABS Take 1 tablet (10 mg total) by mouth every 6 (six) hours as needed. (Patient not taking: Reported on 07/21/2018) 60 tablet 0   No current facility-administered medications for this visit.     VITAL SIGNS: BP 134/78 (BP Location: Right Arm, Patient Position: Sitting)   Pulse 79   Temp 99.3 F (37.4 C) (Tympanic)   Resp 16  Filed Weights    Estimated body mass index is 23.75 kg/m as calculated from the following:   Height as of 06/23/18: 5' 11" (1.803 m).   Weight as of 08/19/18: 170 lb 4.8 oz (77.2 kg).  LABS: CBC:    Component Value Date/Time   WBC 5.4 08/19/2018 0945   HGB 9.1 (L) 08/19/2018 0945   HCT 31.1 (L) 08/19/2018 0945   PLT 362 08/19/2018 0945   MCV 69.4 (L) 08/19/2018 0945   NEUTROABS 4.5 08/19/2018 0945   LYMPHSABS 0.4 (L) 08/19/2018 0945   MONOABS 0.4 08/19/2018 0945   EOSABS 0.1 08/19/2018 0945   BASOSABS 0.0 08/19/2018 0945   Comprehensive Metabolic Panel:    Component Value Date/Time   NA 139 08/19/2018 0945   K 3.7 08/19/2018 0945   CL 103 08/19/2018 0945   CO2 26 08/19/2018 0945   BUN 13 08/19/2018 0945   CREATININE 1.24 08/19/2018 0945   GLUCOSE 136 (H) 08/19/2018 0945   CALCIUM 8.8 (L) 08/19/2018 0945   AST 14 (L) 08/19/2018 0945   ALT 10 08/19/2018 0945   ALKPHOS 54 08/19/2018 0945   BILITOT 0.8 08/19/2018 0945   PROT 7.6 08/19/2018 0945   ALBUMIN 3.7 08/19/2018 0945    RADIOGRAPHIC STUDIES: No results found.  PERFORMANCE STATUS (ECOG) : 1 - Symptomatic but completely ambulatory  Review of Systems As noted above. Otherwise, a complete review of systems is negative.  Physical Exam General: NAD, frail appearing, thin Cardiovascular: regular rate and rhythm Pulmonary: Unlabored Extremities: no edema, no joint deformities Skin: Large circular wound to the left axilla with some purulent drainage, has roughly 5 cm circular nodule superior  to existing wound with indurated  and semi-fluctuant center without erythema/drainage/warmth Neurological: Weakness but otherwise nonfocal  IMPRESSION: Patient was seen today in the wound center.  He reports new worsening left axilla mass that has become increasingly painful.  Wound center referred him to [place] for evaluation.  Of note, patient was seen roughly a week ago for I&D of a posterior back lesion by dermatology.  Cultures from this I&D are not available.  Last culture of the left axilla was done in September 2019 which showed pansensitive enterococcus faecalis.  Patient was started on cephalexin 500 mg 3 times daily x30 days by dermatology.  I suspect that the rapid onset and painful nature of patient's left axilla mass likely reflects an abscess.  First-line treatment would be I&D.  I discussed with patient the option of switching oral antibiotics to include empiric coverage of MRSA.  However, patient opted to continue on cephalexin.  Will refer to surgery for evaluation at the first available opportunity.  Patient has prescription for  oxycodone.  However, he has not been using this.  We discussed as needed use for pain.  We also discussed safety of opioid use including not driving after taking meds.  We also discussed prophylactic bowel regimen.  I spoke with patient's pharmacy to clarify current antibiotic prescription.  Case discussed with supervising physician.  PLAN: Continue cephalexin 500 mg 3 times daily May use warm compresses 3 times daily May take oxycodone as needed for pain Referral to surgery for evaluation of left axilla mass RTC in 1 month or sooner if needed   Patient expressed understanding and was in agreement with this plan. He also understands that He can call clinic at any time with any questions, concerns, or complaints.    Time Total: 40 minutes  Visit consisted of counseling and education dealing with the complex and emotionally intense issues of  symptom management and palliative care in the setting of serious and potentially life-threatening illness.Greater than 50%  of this time was spent counseling and coordinating care related to the above assessment and plan.  Signed by: Joshua Borders, PhD, DNP, NP-C, ACHPN [phone] (Work Cell) 

## 2018-08-24 ENCOUNTER — Encounter: Payer: Self-pay | Admitting: Surgery

## 2018-08-24 ENCOUNTER — Ambulatory Visit: Payer: Medicare Other | Admitting: Occupational Therapy

## 2018-08-24 ENCOUNTER — Ambulatory Visit (INDEPENDENT_AMBULATORY_CARE_PROVIDER_SITE_OTHER): Payer: Medicare Other | Admitting: Surgery

## 2018-08-24 VITALS — BP 151/92 | HR 111 | Temp 97.9°F | Ht 71.0 in | Wt 172.2 lb

## 2018-08-24 DIAGNOSIS — M79622 Pain in left upper arm: Secondary | ICD-10-CM | POA: Diagnosis not present

## 2018-08-24 DIAGNOSIS — C439 Malignant melanoma of skin, unspecified: Secondary | ICD-10-CM

## 2018-08-24 DIAGNOSIS — C799 Secondary malignant neoplasm of unspecified site: Secondary | ICD-10-CM | POA: Diagnosis not present

## 2018-08-24 NOTE — Patient Instructions (Addendum)
Patient is to return to see Dr. Rosana Hoes in 1 week for underarm mass. He has been informed to pack the wound 2 times a day and continue taking all antibiotics. Patient should call the office with any questions or concerns.

## 2018-08-24 NOTE — Progress Notes (Signed)
Surgical Clinic Progress/Follow-up Note   HPI:  67 y.o. Male presents to clinic for evaluation of his Left axillary wound, possible abscess. Patient describes he has had a chronic open Left axillary wound since he underwent incisional biopsy of Left axillary mass (Dr. Kaylyn Lim, 06/26/2017 at [facility]). Pathology confirmed metastatic melanoma with subsequent imaging demonstrating mediastinal and bone metastases, for which patient was started initially on immunotherapy, but was switched to oral chemotherapy following negative exploratory laparotomy for pneumoperitoneum Adonis Huguenin, 10/14/2017) with prolonged post-surgical ileus. His persistent Left axillary wound has since been managed with packing placed by outpatient wound clinic. However, patient says he does not pack his wound as instructed, but rather places a gauze dressing over his wound to collect drainage except after packing is placed by wound clinic. He currently denies Left axillary pain, though he describes malodorous drainage for which palliative care NP to whom he was referred by wound clinic has requested evaluation for Left axillary abscess. Patient otherwise denies fever/chills or purulent drainage. Of note, patient 3 months ago underwent selective Left subclavian arterial branch embolization ([place], 05/11/2018) for Left axillary bleeding. Most recent Left axillary wound culture (06/2018) grew enterococcus.  Review of Systems:  Constitutional: denies any other weight loss, fever, chills, or sweats  Eyes: denies any other vision changes, history of eye injury  ENT: denies sore throat, hearing problems  Respiratory: denies shortness of breath, wheezing  Cardiovascular: denies chest pain, palpitations  Gastrointestinal: denies abdominal pain, N/V, or diarrhea Musculoskeletal: denies any other joint pains or cramps  Skin: Denies any other rashes or skin discolorations except as per interval history Neurological: denies any other headache,  dizziness, weakness  Psychiatric: denies any other depression, anxiety  All other review of systems: otherwise negative   Vital Signs:  BP (!) 151/92   Pulse (!) 111   Temp 97.9 F (36.6 C) (Temporal)   Ht 5\' 11"  (1.803 m)   Wt 172 lb 3.2 oz (78.1 kg)   SpO2 96%   BMI 24.02 kg/m    Physical Exam:  Constitutional:  -- Normal body habitus  -- Awake, alert, and oriented x3  Eyes:  -- Pupils equally round and reactive to light  -- No scleral icterus  Ear, nose, throat:  -- No jugular venous distension  -- No nasal drainage, bleeding Pulmonary:  -- No crackles -- Equal breath sounds bilaterally -- Breathing non-labored at rest Cardiovascular:  -- S1, S2 present  -- No pericardial rubs  Gastrointestinal:  -- Soft and completely nontender, non-distended with no guarding/rebound tenderness -- Former midline laparotomy incision well-healed without any surrounding erythema or peri-incisional hernia(s) appreciated -- No other abdominal masses appreciated, pulsatile or otherwise  Musculoskeletal / Integumentary:  -- Wounds or skin discoloration: 1 cm open Left axillary wound overlying a rather large underlying cavity with significant amounts of necrotic tissue vs tumor expressed and removed with insertion (using cotton swab) and removal of multiple gauze sponges, no fluctuance or erythema associated with minimally tender to palpation Left axillary masses -- Extremities: B/L UE and LE FROM, hands and feet warm, LUE lymphedema with LUE compression stocking in place Neurologic:  -- Motor function: intact and symmetric  -- Sensation: intact and symmetric   Laboratory studies:  CBC Latest Ref Rng & Units 08/19/2018 07/21/2018 06/23/2018  WBC 4.0 - 10.5 K/uL 5.4 4.6 4.0  Hemoglobin 13.0 - 17.0 g/dL 9.1(L) 9.1(L) 8.5(L)  Hematocrit 39.0 - 52.0 % 31.1(L) 28.9(L) 26.7(L)  Platelets 150 - 400 K/uL 362 301 272  CMP Latest Ref Rng & Units 08/19/2018 07/21/2018 06/23/2018  Glucose 70 - 99 mg/dL  136(H) 90 102(H)  BUN 8 - 23 mg/dL 13 15 16   Creatinine 0.61 - 1.24 mg/dL 1.24 1.01 1.12  Sodium 135 - 145 mmol/L 139 140 138  Potassium 3.5 - 5.1 mmol/L 3.7 3.9 3.6  Chloride 98 - 111 mmol/L 103 107 107  CO2 22 - 32 mmol/L 26 23 23   Calcium 8.9 - 10.3 mg/dL 8.8(L) 8.7(L) 8.1(L)  Total Protein 6.5 - 8.1 g/dL 7.6 6.9 6.6  Total Bilirubin 0.3 - 1.2 mg/dL 0.8 0.7 0.7  Alkaline Phos 38 - 126 U/L 54 52 52  AST 15 - 41 U/L 14(L) 14(L) 14(L)  ALT 0 - 44 U/L 10 10 9    Imaging:  NM PET Whole Body Imaging (07/19/2018) 1. The dominant left axillary mass appears internally necrotic without central hypermetabolism and now contains foci of gas. Although the overall size is slightly increased in size when compared with the previous exam the degree of internal uptake has diminished in the interval. Persistent FDG uptake within the margins of this mass is stable to decreased from previous exam. 2. New small ulcerative lesion within the left posterior upper chest wall within SUV max of 3.78. Although this may be inflammatory in etiology, cutaneous metastasis is not excluded and correlation with physical exam findings is advised. 3. Low level FDG uptake associated with 2 cm lucent lesion within the left iliac bone.  LUE Venous Doppler (06/23/2018) No evidence of left upper extremity DVT.  Assessment:  67 y.o. yo Male with a problem list including...  Patient Active Problem List   Diagnosis Date Noted  . Anemia 05/10/2018  . Perforated viscus   . Ileus ([place])   . Constipation 10/10/2017  . Metastatic melanoma ([place]) 07/07/2017  . Axillary mass, left 06/26/2017  . Chronic pain of left knee 12/14/2015  . Influenza vaccination declined 11/27/2015    presents to clinic for evaluation drainage Left axillary open wound, more consistent with tissue/tumor necrosis than abscess.  Plan:   - chronic antibiotics as prescribed  - importance of packing at least twice daily + prn discussed and  encouraged  - dry gauze packed and removed several times with removal of significant necrotic tissue and no appreciable pus underlying persistent non-fluctuant Left axillary mass(es)  - return to clinic next week for follow-up reassessment and possible incision and drainage if indicated  - instructed to call office if any questions or concerns  All of the above recommendations were discussed with the patient, and all of patient's questions were answered to his expressed satisfaction.  -- Marilynne Drivers Rosana Hoes, MD, [place]: [facility] - Partnering for exceptional care. Office: [phone]

## 2018-08-24 NOTE — Therapy (Signed)
[place] MAIN [facility] SERVICES [address] [place], [place], [address] Phone: [phone]   Fax:  [phone]  Occupational Therapy Treatment  Patient Details  Name: Louis Davies MRN: [id number] Date of Birth: 08-29-1951 No data recorded  Encounter Date: 08/23/2018  OT End of Session - 08/23/18 1520    Visit Number  17    Number of Visits  36    Date for OT Re-Evaluation  09/27/18    OT Start Time  0200    OT Stop Time  0300    OT Time Calculation (min)  60 min    Activity Tolerance  Patient tolerated treatment well;Patient limited by pain;Treatment limited secondary to medical complications (Comment)    Behavior During Therapy  [facility] for tasks assessed/performed       Past Medical History:  Diagnosis Date  . Anemia   . Arthritis   . Cancer ([place])   . Complication of anesthesia   . PONV (postoperative nausea and vomiting)     Past Surgical History:  Procedure Laterality Date  . AXILLARY LYMPH NODE DISSECTION Left 06/26/2017   Procedure: EXCISION OF LEFT AXILLARY MASS;  Surgeon: Johnathan Hausen, MD;  Location: [facility] ORS;  Service: General;  Laterality: Left;  . DIAGNOSTIC LAPAROSCOPY    . EMBOLIZATION Left 05/11/2018   Procedure: EMBOLIZATION;  Surgeon: Katha Cabal, MD;  Location: [place] [facility];  Service: Cardiovascular;  Laterality: Left;  . LAPAROTOMY N/A 10/14/2017   Procedure: EXPLORATORY LAPAROTOMY;  Surgeon: Clayburn Pert, MD;  Location: [facility] ORS;  Service: General;  Laterality: N/A;  . VASECTOMY      There were no vitals filed for this visit.  Subjective Assessment - 08/23/18 1511    Subjective   Pt presents for OT visit 17/36 for Complete Decongestive Therapy (CDT) for melanoma-related  LUE/LUq lymphedema (LE). Pt reports worsening pain and increased drainage in L axilla over the weekend. "It's really hurting today and it feels really hard. They dont give me enough bandages. II get one a day but use a washrag  in my armpit over night. It drains so much.    Currently in Pain?  Yes    Pain Score  --   not rated numerically.    Pain Location  Axilla    Pain Orientation  Left    Pain Descriptors / Indicators  Aching;Heaviness;Tender;Discomfort;Throbbing;Constant;Sore;Tightness;Grimacing;Pressure;Guarding;Cramping;Radiating;Squeezing;Tiring;Restless    Pain Type  Acute pain    Pain Onset  Yesterday   post surgical   Pain Relieving Factors  intractible today                   OT Treatments/Exercises (OP) - 08/24/18 0001      ADLs   ADL Education Given  Yes      Manual Therapy   Manual Therapy  Edema management;Manual Lymphatic Drainage (MLD);Myofascial release    Manual therapy comments  very gentle myofacial release to lymphatic cording visible and palpable in antecubital fossa on affected L arm. Minimal strokes in L axilla area due to pain. Drainage from axillary wound more malodorous than typical today and more plentiful. Arm swelling well controlled. L axilla and adjacent chest wall mildly swollen. Skin temp is cool to palpation and no erythema observed in undressed areas adjacent to wound.     Compression Bandaging  no sleeve. Pt has appt at wound clinic after this visit. Pt will apply single layer short stretch wrap over single layer Rosidal foam to arm once at home.  OT Long Term Goals - 07/29/18 1637      OT LONG TERM GOAL #1   Title  Pt will be able to apply multi-layer short stretch compression wraps using correct gradient techniques with independently , but with extra time  to achieve optimal limb volume reduction during Intensive Phase CDT, and to return affected limb(s) , as closely as possible, to premorbid size and shape.    Baseline  Dependent    Time  2    Period  Weeks    Status  Achieved      OT LONG TERM GOAL #2   Title  Pt will demonstrate understanding of lymphedema (LE) precautions / prevention principals, including signs /  symptoms of cellulitis infection with modified independence using LE Workbook as printed reference to identify 6 precautions without verbal cues by end of 3rd  OT Rx visit.     Baseline  no knowledge    Time  3    Period  Days    Status  Achieved      OT LONG TERM GOAL #3   Title  Pt to achieve no less than 10% limb volume reduction in affected limb(s) during Intensive Phase CDT to control limb swelling, to improve tissue integrity and immune function, to improve ADLs performance and to improve functional mobility/ transfers.    Baseline  dependent    Time  12    Period  Weeks    Status  Partially Met      OT LONG TERM GOAL #4   Title  Pt will achieve 100% compliance with daily LE self-care home program components, including proper skin care, simple self-MLD, gradient compression wraps/ garments, and therapeutic exercise to ensure optimal Intensive Phase limb volume reduction to expedite compression garment/ device fitting.    Baseline  Max A    Time  12    Period  Weeks    Status  On-going      OT LONG TERM GOAL #5   Title  Pt will demonstrate competent use of assistive devices during LE self-care training to improve ability to don and doff compression garments/devices with modified assistance to ensure optimal LE self-management over time.    Baseline  Max A    Time  12    Status  On-going      OT LONG TERM GOAL #6   Title  Pt will retain optimal limb volume reductions achieved during Intensive Phase CDT with no more than 3% volume increase  without CG assistance to limit LE progression and further functional decline.    Baseline  Dependent    Time  6    Period  Months    Status  On-going            Plan - 08/24/18 1521    Clinical Impression Statement  very gentle myofacial release to lymphatic cording visible and palpable in antecubital fossa on affected L arm. Minimal strokes in L axilla area due to pain. Drainage from axillary wound more malodorous than typical today  and more plentiful. Arm swelling well controlled. L axilla and adjacent chest wall mildly swollen. Skin temp is cool to palpation and no erythema observed in undressed areas adjacent to wound.  No sleeve after MLD as Pt on his way to wound care appointment after OT. Recommended Pt apply single layer short stretch compression wrap tover single layer Rosidal foam o L hand and arm once at home, unless wound care provider directs him not to wrap.  Carefully monitor skin condition for infection and cont manual therapies and compression as tolerated to retain clinical gains for LE control.    Occupational performance deficits (Please refer to evaluation for details):  ADL's;Leisure;Social Participation;IADL's;Rest and Sleep;Work;Other   body image   Rehab Potential  Good    OT Frequency  3x / week    OT Duration  Other (comment)   and PRN   OT Treatment/Interventions  Self-care/ADL training;Therapeutic exercise;Manual lymph drainage;Compression bandaging;Patient/family education;Other (comment);Energy conservation;Therapeutic activities;Balance training;DME and/or AE instruction;Manual Therapy;Passive range of motion    Clinical Decision Making  Multiple treatment options, significant modification of task necessary    Recommended Other Services  fit with custom, clat knit LUE compression sleeve and HOS device. Consider seamless compression glove and vest. Consider kinesiotape during Intensive Phase    Consulted and Agree with Plan of Care  Patient       Patient will benefit from skilled therapeutic intervention in order to improve the following deficits and impairments:  Decreased endurance, Decreased mobility, Decreased skin integrity, Impaired sensation, Decreased knowledge of precautions, Decreased range of motion, Decreased scar mobility, Increased edema, Pain, Decreased activity tolerance, Decreased knowledge of use of DME, Decreased strength, Impaired flexibility, Impaired UE functional use, Decreased  psychosocial skills, Decreased balance, Other (comment)(balance and gait negatively impacted by guarding  posture , impaired armswing and body asymmetry)  Visit Diagnosis: Lymphedema, not elsewhere classified    Problem List Patient Active Problem List   Diagnosis Date Noted  . Anemia 05/10/2018  . Perforated viscus   . Ileus ([place])   . Constipation 10/10/2017  . Metastatic melanoma ([place]) 07/07/2017  . Axillary mass, left 06/26/2017  . Chronic pain of left knee 12/14/2015  . Influenza vaccination declined 11/27/2015    Andrey Spearman, MS, OTR/L, [facility] 08/24/18 3:27 PM  [place] MAIN [facility] SERVICES [address] [place], [place], [address] Phone: [phone]   Fax:  [phone]  Name: DEVONTAE CASASOLA MRN: [id number] Date of Birth: 1951/09/09

## 2018-08-26 ENCOUNTER — Ambulatory Visit: Payer: Medicare Other | Admitting: Occupational Therapy

## 2018-08-26 DIAGNOSIS — E8989 Other postprocedural endocrine and metabolic complications and disorders: Secondary | ICD-10-CM | POA: Diagnosis not present

## 2018-08-26 DIAGNOSIS — I890 Lymphedema, not elsewhere classified: Secondary | ICD-10-CM

## 2018-08-26 NOTE — Therapy (Signed)
[place] MAIN [facility] SERVICES [address] [place], [place], [address] Phone: [phone]   Fax:  [phone]  Occupational Therapy Treatment  Patient Details  Name: Louis Davies MRN: [id number] Date of Birth: 06-20-1951 No data recorded  Encounter Date: 08/26/2018  OT End of Session - 08/26/18 1557    Visit Number  18    Number of Visits  36    Date for OT Re-Evaluation  09/27/18    OT Start Time  0200    OT Stop Time  0300    OT Time Calculation (min)  60 min    Activity Tolerance  Patient tolerated treatment well;Patient limited by pain;Treatment limited secondary to medical complications (Comment)    Behavior During Therapy  [facility] for tasks assessed/performed       Past Medical History:  Diagnosis Date  . Anemia   . Arthritis   . Cancer ([place])   . Complication of anesthesia   . Perforated viscus   . PONV (postoperative nausea and vomiting)     Past Surgical History:  Procedure Laterality Date  . AXILLARY LYMPH NODE DISSECTION Left 06/26/2017   Procedure: EXCISION OF LEFT AXILLARY MASS;  Surgeon: Johnathan Hausen, MD;  Location: [facility] ORS;  Service: General;  Laterality: Left;  . DIAGNOSTIC LAPAROSCOPY    . EMBOLIZATION Left 05/11/2018   Procedure: EMBOLIZATION;  Surgeon: Katha Cabal, MD;  Location: [facility];  Service: Cardiovascular;  Laterality: Left;  . LAPAROTOMY N/A 10/14/2017   Procedure: EXPLORATORY LAPAROTOMY;  Surgeon: Clayburn Pert, MD;  Location: [facility] ORS;  Service: General;  Laterality: N/A;  . VASECTOMY      There were no vitals filed for this visit.  Subjective Assessment - 08/26/18 1553    Subjective   Pt presents for OT visit 18/36 for Complete Decongestive Therapy (CDT) for melanoma-related  LUE/LUq lymphedema (LE). Pt reports that his L axilla is less sore and less malodorous today than at last visit. Pt reports he was able to obtain more dressings after seeing physician, which should help  with managing drainage more effectively.     Currently in Pain?  Yes    Pain Score  --   not rated numerically   Pain Location  Axilla    Pain Orientation  Left    Pain Descriptors / Indicators  Heaviness;Tender;Discomfort;Sore;Tightness;Pressure;Guarding;Tiring;Other (Comment)   full   Pain Type  Chronic pain    Pain Onset  Yesterday   post surgical   Pain Frequency  Intermittent                   OT Treatments/Exercises (OP) - 08/26/18 0001      ADLs   ADL Education Given  Yes      Manual Therapy   Manual Therapy  Edema management;Myofascial release;Manual Lymphatic Drainage (MLD)    Manual therapy comments  very gentle myofacial release to lymphatic cording visible and palpable in antecubital fossa on affected L arm. Minimal strokes in L axilla area due to pain. Drainage from axillary wound more malodorous than typical today and more plentiful. Arm swelling well controlled. L axilla and adjacent chest wall mildly swollen. Skin temp is cool to palpation and no erythema observed in undressed areas adjacent to wound.     Manual Lymphatic Drainage (MLD)  MLD in combination with myofacial release to LUE,/ LUQ as established    Compression Bandaging  no sleeve. Pt has appt at wound clinic after this visit. Pt will  apply single layer short stretch wrap over single layer Rosidal foam to arm once at home.             OT Education - 08/26/18 1557    Education Details  Continued skilled Pt/caregiver education  And LE ADL training throughout visit for lymphedema self care/ home program, including compression wrapping, compression garment and device wear/care, lymphatic pumping ther ex, simple self-MLD, and skin care. Discussed progress towards goals.     Person(s) Educated  Patient    Methods  Explanation;Demonstration;Handout;Tactile cues;Verbal cues    Comprehension  Verbalized understanding;Need further instruction;Returned demonstration;Verbal cues required;Tactile cues  required          OT Long Term Goals - 07/29/18 1637      OT LONG TERM GOAL #1   Title  Pt will be able to apply multi-layer short stretch compression wraps using correct gradient techniques with independently , but with extra time  to achieve optimal limb volume reduction during Intensive Phase CDT, and to return affected limb(s) , as closely as possible, to premorbid size and shape.    Baseline  Dependent    Time  2    Period  Weeks    Status  Achieved      OT LONG TERM GOAL #2   Title  Pt will demonstrate understanding of lymphedema (LE) precautions / prevention principals, including signs / symptoms of cellulitis infection with modified independence using LE Workbook as printed reference to identify 6 precautions without verbal cues by end of 3rd  OT Rx visit.     Baseline  no knowledge    Time  3    Period  Days    Status  Achieved      OT LONG TERM GOAL #3   Title  Pt to achieve no less than 10% limb volume reduction in affected limb(s) during Intensive Phase CDT to control limb swelling, to improve tissue integrity and immune function, to improve ADLs performance and to improve functional mobility/ transfers.    Baseline  dependent    Time  12    Period  Weeks    Status  Partially Met      OT LONG TERM GOAL #4   Title  Pt will achieve 100% compliance with daily LE self-care home program components, including proper skin care, simple self-MLD, gradient compression wraps/ garments, and therapeutic exercise to ensure optimal Intensive Phase limb volume reduction to expedite compression garment/ device fitting.    Baseline  Max A    Time  12    Period  Weeks    Status  On-going      OT LONG TERM GOAL #5   Title  Pt will demonstrate competent use of assistive devices during LE self-care training to improve ability to don and doff compression garments/devices with modified assistance to ensure optimal LE self-management over time.    Baseline  Max A    Time  12    Status   On-going      OT LONG TERM GOAL #6   Title  Pt will retain optimal limb volume reductions achieved during Intensive Phase CDT with no more than 3% volume increase  without CG assistance to limit LE progression and further functional decline.    Baseline  Dependent    Time  6    Period  Months    Status  On-going            Plan - 08/26/18 1557    Clinical  Impression Statement  Provided MLD ins supne and side lying using adjacent axillio-inguinal pathway and anterior and posterior inter axilllary anastomoses. Pt uncomfortable on his side , so this portion of session is less utilized than OT prefers. Permed jentle MR to antecubital fossa in effort to release entrapped axillary cording. Cnt as per POC.    Occupational performance deficits (Please refer to evaluation for details):  ADL's;Leisure;Social Participation;IADL's;Rest and Sleep;Work;Other   body image   Rehab Potential  Good    OT Frequency  3x / week    OT Duration  Other (comment)   and PRN   OT Treatment/Interventions  Self-care/ADL training;Therapeutic exercise;Manual lymph drainage;Compression bandaging;Patient/family education;Other (comment);Energy conservation;Therapeutic activities;Balance training;DME and/or AE instruction;Manual Therapy;Passive range of motion    Clinical Decision Making  Multiple treatment options, significant modification of task necessary    Recommended Other Services  fit with custom, clat knit LUE compression sleeve and HOS device. Consider seamless compression glove and vest. Consider kinesiotape during Intensive Phase    Consulted and Agree with Plan of Care  Patient       Patient will benefit from skilled therapeutic intervention in order to improve the following deficits and impairments:  Decreased endurance, Decreased mobility, Decreased skin integrity, Impaired sensation, Decreased knowledge of precautions, Decreased range of motion, Decreased scar mobility, Increased edema, Pain, Decreased  activity tolerance, Decreased knowledge of use of DME, Decreased strength, Impaired flexibility, Impaired UE functional use, Decreased psychosocial skills, Decreased balance, Other (comment)(balance and gait negatively impacted by guarding  posture , impaired armswing and body asymmetry)  Visit Diagnosis: Lymphedema, not elsewhere classified    Problem List Patient Active Problem List   Diagnosis Date Noted  . Anemia 05/10/2018  . Ileus ([place])   . Constipation 10/10/2017  . Metastatic melanoma ([place]) 07/07/2017  . Axillary mass, left 06/26/2017  . Chronic pain of left knee 12/14/2015  . Influenza vaccination declined 11/27/2015    Andrey Spearman, MS, OTR/L, [facility] 08/26/18 4:01 PM  [place] MAIN [facility] SERVICES [address] [place], [place], [address] Phone: [phone]   Fax:  [phone]  Name: LIRON EISSLER MRN: [id number] Date of Birth: Jul 19, 1951

## 2018-08-28 NOTE — Progress Notes (Signed)
DENIM, KALMBACH ([id number]) Visit Report for 08/23/2018 Chief Complaint Document Details Patient Name: Louis Davies, Louis Davies Date of Service: 08/23/2018 3:30 PM Medical Record Number: [id number] Patient Account Number: [id number] Date of Birth/Sex: [birth year] (67 y.o. M) Treating RN: Cornell Barman Primary Care Provider: Alvera Singh Other Clinician: Referring Provider: Alvera Singh Treating Provider/Extender: Melburn Hake, Rudell Marlowe Weeks in Treatment: 9 Information Obtained from: Patient Chief Complaint Left axillary metastatic ulcer Electronic Signature(s) Signed: 08/24/2018 11:39:33 AM By: Worthy Keeler PA-C Entered By: Worthy Keeler on 08/23/2018 17:37:22 Louis Davies ([id number]) -------------------------------------------------------------------------------- HPI Details Patient Name: Louis Davies Date of Service: 08/23/2018 3:30 PM Medical Record Number: [id number] Patient Account Number: [id number] Date of Birth/Sex: 03/19/1951 (67 y.o. 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 The patient following excision did undergo radiation therapy which he has completed at this point. He is still on oral chemotherapy at this time however. Patient states that he does have some discomfort at the site but one of the biggest issues is actually the malodorous smell that is emanating from the wound that he states make some very self-conscious. He also did have an infection recently that he just completed oral antibiotic therapy for he was unsure of the antibiotic that he was on. He states that  just ended yesterday. Nonetheless No fevers, chills, nausea, or vomiting noted at this time. 06/22/18 on evaluation today patient's ulcer in the left axillary region actually has a fairly strong odor even I feel more so than last week at this point. He has been tolerating the dressing changes without complication. With that being said I think we may want to culture this to ensure there is no infection I know he's recently been on antibiotics before I first saw him but nonetheless I feel like this is going to be likely necessary in order to try to help control some of the older especially if it's coming from an infection 06/28/18 on evaluation today patient appears to be doing a little better in my pinion in regard to the drainage from the left axillary ulcer. He's been tolerating the dressing changes without complication. With that being said I do have his culture for review today which shows that it was positive for Enterococcus faecalis and is sensitive to ampicillin. With that being said he seems to be tolerating the dressing changes very well and in general I am pleased with what I'm seeing. I'm unsure of how much of the cancer may be remaining the patient also states that he will be having a pet scan at the end of this month on the 30th he believes. 07/12/18 on evaluation today patient actually appears to be doing a little better in regard to his ulcer and the left axillary region. He's been tolerating the dressing changes without complication the odor is better I do believe that the antibiotic has been of some benefit in this regard. Fortunately there's no evidence of overt infection at this time. 08/09/18 on evaluation today patient actually appears to be doing a little better in regard to the ulcer in the left axillary region. At this point I did review his oncology notes and he has completed radiation  therapy and apparently this cancerous lesion seems to be doing decently well. Obviously that is  worse on the necrotic tissue and odor may be coming from as far as the chemotherapy the amount of tissue that has been destroyed is concerned inside the wound itself. Fortunately there does not appear to be evidence of infection and the owner itself is not as significant as was previous. 08/23/18 on evaluation today patient actually appears to be doing more poorly in regard to his left axillary ulcer. He actually has an area of swelling/induration which exhibited signs of fluctuance which was posterior/lateral to the open wound in the left axillary area. This is been more tender. The patient tells me that he has been on antibiotics and still is taking them for a staff infection. Nonetheless it feels like you may be an abscess region underneath this area. No fevers, chills, nausea, or vomiting noted at this time. Electronic Signature(s) Signed: 08/24/2018 11:39:33 AM By: Worthy Keeler PA-C Entered By: Worthy Keeler on 08/23/2018 17:38:12 Louis Davies ([id number]) -------------------------------------------------------------------------------- Physical Exam Details Patient Name: Louis Davies Date of Service: 08/23/2018 3:30 PM Medical Record Number: [id number] Patient Account Number: [id number] Date of Birth/Sex: [birth year] (67 y.o. M) Treating RN: Cornell Barman Primary Care Provider: Alvera Singh Other Clinician: Referring Provider: Alvera Singh Treating Provider/Extender: STONE III, Genia Perin Weeks in Treatment: 9 Constitutional Well-nourished and well-hydrated in no acute distress. Respiratory normal breathing without difficulty. clear to auscultation bilaterally. Cardiovascular regular rate and rhythm with normal S1, S2. Psychiatric this patient is able to make decisions and demonstrates good insight into disease process. Alert and Oriented x 3. pleasant and cooperative. Notes At this point patient's wound bed shows evidence of being about the same he still has  dart/malodorous drainage noted and still has significant depth to the wound. Unfortunately in regard to the area fluctuance wishes lateral/posterior to the wound opening it appears this may be an abscess. When I pushed on the area slightly I was noting fluid draining through the side wall of the wound and then running out. Nonetheless this may need to be addressed being that this is an area where he's previously had issues with cancer I'm really not comfortable going in in performing incision and drainage or otherwise although I think someone may need to have a better look at this. Electronic Signature(s) Signed: 08/24/2018 11:39:33 AM By: Worthy Keeler PA-C Entered By: Worthy Keeler on 08/23/2018 17:39:22 Louis Davies ([id number]) -------------------------------------------------------------------------------- Physician Orders Details Patient Name: Louis Davies Date of Service: 08/23/2018 3:30 PM Medical Record Number: [id number] Patient Account Number: [id number] Date of Birth/Sex: September 26, 1951 (67 y.o. M) Treating RN: Cornell Barman Primary Care Provider: Alvera Singh Other Clinician: Referring Provider: Alvera Singh Treating Provider/Extender: Melburn Hake, Katora Fini Weeks in Treatment: 9 Verbal / Phone Orders: No Diagnosis Coding Wound Cleansing Wound #1 Left Axilla o Clean wound with Normal Saline. o May Shower, gently pat wound dry prior to applying new dressing. - wash with hibiclens Anesthetic (add to Medication List) Wound #1 Left Axilla o Topical Lidocaine 4% cream applied to wound bed prior to debridement (In Clinic Only). Skin Barriers/Peri-Wound Care Wound #1 Left Axilla o Skin Prep Primary Wound Dressing Wound #1 Left Axilla o Silver Alginate - rope Secondary Dressing o ABD pad o Other - carboflex Dressing Change Frequency Wound #1 Left Axilla o Change dressing every day. Follow-up Appointments Wound #1 Left Axilla o Return Appointment  in 2 weeks. Additional Orders / Instructions Wound #1 Left  Axilla o Increase protein intake. Electronic Signature(s) Signed: 08/24/2018 11:39:33 AM By: Worthy Keeler PA-C Signed: 08/26/2018 10:02:59 AM By: Gretta Cool, BSN, RN, CWS, Kim RN, BSN Entered By: Gretta Cool, BSN, RN, CWS, Kim on 08/23/2018 16:08:44 Louis Davies ([id number]) -------------------------------------------------------------------------------- Problem List Details Patient Name: Louis Davies Date of Service: 08/23/2018 3:30 PM Medical Record Number: [id number] Patient Account Number: [id number] Date of Birth/Sex: [birth year] (67 y.o. 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 M) Treating RN: Cornell Barman Primary Care Provider: Alvera Singh Other Clinician: Referring Provider: Alvera Singh Treating Provider/Extender: Melburn Hake, Demetria Iwai Weeks in Treatment: 9 Subjective Chief Complaint Information obtained from Patient Left axillary metastatic ulcer History of Present Illness  (HPI) 06/15/18 on evaluation today patient presents for initial evaluation and our clinic as a result of having had a ulcer on the axillary region which was initially metastatic melanoma which was subsequently excised. The patient following excision did undergo radiation therapy which he has completed at this point. He is still on oral chemotherapy at this time however. Patient states that he does have some discomfort at the site but one of the biggest issues is actually the malodorous smell that is emanating from the wound that he states make some very self-conscious. He also did have an infection recently that he just completed oral antibiotic therapy for he was unsure of the antibiotic that he was on. He states that just ended yesterday. Nonetheless No fevers, chills, nausea, or vomiting noted at this time. 06/22/18 on evaluation today patient's ulcer in the left axillary region actually has a fairly strong odor even I feel more so than last week at this point. He has been tolerating the dressing changes without complication. With that being said I think we may want to culture this to ensure there is no infection I know he's recently been on antibiotics before I first saw him but nonetheless I feel like this is going to be likely necessary in order to try to help control some of the older especially if it's coming from an infection 06/28/18 on evaluation today patient appears to be doing a little better in my pinion in regard to the drainage from the left axillary ulcer. He's been tolerating the dressing changes without complication. With that being said I do have his culture for review today which shows that it was positive for Enterococcus faecalis and is sensitive to ampicillin. With that being said he seems to be tolerating the dressing changes very well and in general I am pleased with what I'm seeing. I'm unsure of how much of the cancer may be remaining the patient also states that he will be  having a pet scan at the end of this month on the 30th he believes. 07/12/18 on evaluation today patient actually appears to be doing  a little better in regard to his ulcer and the left axillary region. He's been tolerating the dressing changes without complication the odor is better I do believe that the antibiotic has been of some benefit in this regard. Fortunately there's no evidence of overt infection at this time. 08/09/18 on evaluation today patient actually appears to be doing a little better in regard to the ulcer in the left axillary region. At this point I did review his oncology notes and he has completed radiation therapy and apparently this cancerous lesion seems to be doing decently well. Obviously that is worse on the necrotic tissue and odor may be coming from as far as the chemotherapy the amount of tissue that has been destroyed is concerned inside the wound itself. Fortunately there does not appear to be evidence of infection and the owner itself is not as significant as was previous. 08/23/18 on evaluation today patient actually appears to be doing more poorly in regard to his left axillary ulcer. He actually has an area of swelling/induration which exhibited signs of fluctuance which was posterior/lateral to the open wound in the left axillary area. This is been more tender. The patient tells me that he has been on antibiotics and still is taking them for a staff infection. Nonetheless it feels like you may be an abscess region underneath this area. No fevers, chills, nausea, or vomiting noted at this time. Patient History Information obtained from Patient. Louis Davies, Louis Davies ([id number]) Family History Cancer - Father, Diabetes - Mother, Hypertension - Mother, No family history of Heart Disease, Kidney Disease, Lung Disease, Stroke, Thyroid Problems, Tuberculosis. Social History Never smoker, Marital Status - Divorced, Alcohol Use - Never, Drug Use - No History, Caffeine Use -  Rarely. Medical And Surgical History Notes Constitutional Symptoms (General Health) Radiation and Chemo for Metastatic Melanoma Review of Systems (ROS) Constitutional Symptoms (General Health) Denies complaints or symptoms of Fever, Chills. Respiratory The patient has no complaints or symptoms. Cardiovascular The patient has no complaints or symptoms. Psychiatric The patient has no complaints or symptoms. Objective Constitutional Well-nourished and well-hydrated in no acute distress. Vitals Time Taken: 3:42 PM, Height: 71 in, Weight: 185.6 lbs, BMI: 25.9, Temperature: 99 F, Pulse: 77 bpm, Respiratory Rate: 16 breaths/min, Blood Pressure: 127/54 mmHg. Respiratory normal breathing without difficulty. clear to auscultation bilaterally. Cardiovascular regular rate and rhythm with normal S1, S2. Psychiatric this patient is able to make decisions and demonstrates good insight into disease process. Alert and Oriented x 3. pleasant and cooperative. General Notes: At this point patient's wound bed shows evidence of being about the same he still has dart/malodorous drainage noted and still has significant depth to the wound. Unfortunately in regard to the area fluctuance wishes lateral/posterior to the wound opening it appears this may be an abscess. When I pushed on the area slightly I was noting fluid draining through the side wall of the wound and then running out. Nonetheless this may need to be addressed being that this is an area where he's previously had issues with cancer I'm really not comfortable going in in performing incision and drainage or otherwise although I think someone may need to have a better look at this. Integumentary (Hair, Skin) Wound #1 status is Open. Original cause of wound was Gradually Appeared. The wound is located on the Left Axilla. The wound measures 0.9cm length x 0.9cm width x 3cm depth; 0.636cm^2 area and 1.909cm^3 volume. There is Fat Louis Davies, Louis Davies. ([id number]) (Subcutaneous Tissue) Exposed exposed.  There is no tunneling or undermining noted. There is a large amount of serosanguineous drainage noted. The wound margin is flat and intact. There is no granulation within the wound bed. There is a small (1-33%) amount of necrotic tissue within the wound bed including Adherent Slough. The periwound skin appearance exhibited: Rash, Scarring, Maceration. The periwound skin appearance did not exhibit: Callus, Crepitus, Excoriation, Induration, Atrophie Blanche, Cyanosis, Ecchymosis, Hemosiderin Staining, Mottled, Pallor, Rubor, Erythema. 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 May Shower, gently pat wound dry prior to applying new dressing. - wash with hibiclens Anesthetic (add to Medication List): Wound #1 Left Axilla: Topical Lidocaine 4% cream applied to wound bed prior to debridement (In Clinic Only). Skin Barriers/Peri-Wound Care: Wound #1 Left Axilla: Skin Prep Primary Wound Dressing: Wound #1 Left Axilla: Silver Alginate - rope Secondary Dressing: ABD pad Other - carboflex Dressing Change Frequency: Wound #1 Left Axilla: Change dressing every day. Follow-up Appointments: Wound #1 Left Axilla: Return Appointment in 2 weeks. Additional Orders / Instructions: Wound #1 Left Axilla: Increase protein intake. I'm gonna suggest at this point that we continue with the above wound care measures as I feel like that's really the best we can do at this time. Nonetheless I would like him to see Dr. Grayland Ormond his oncology physician to see if there's anything he would suggest at this point. I'm not sure if the patient may need IV antibiotic therapy or potentially surgical  debridement at this time. Nonetheless I do feel like things are not doing as well as they potentially could be. The patient is in agreement this plan Louis Davies, Louis Davies ([id number]) is gonna get in touch with Dr. Grayland Ormond as soon as he can. Otherwise will see him back for reevaluation in two weeks to see were things stand. Please see above for specific wound care orders. We will see patient for re-evaluation in 2 week(s) here in the clinic. If anything worsens or changes patient will contact our office for additional recommendations. Electronic Signature(s) Signed: 08/24/2018 11:39:33 AM By: Worthy Keeler PA-C Entered By: Worthy Keeler on 08/23/2018 17:40:28 Louis Davies ([id number]) -------------------------------------------------------------------------------- ROS/PFSH Details Patient Name: Louis Davies Date of Service: 08/23/2018 3:30 PM Medical Record Number: [id number] Patient Account Number: [id number] Date of Birth/Sex: Aug 12, 1951 (67 y.o. M) Treating RN: Cornell Barman Primary Care Provider: Alvera Singh Other Clinician: Referring Provider: Alvera Singh Treating Provider/Extender: Melburn Hake, Ioannis Schuh Weeks in Treatment: 9 Information Obtained From Patient Wound History Do you currently have one or more open woundso Yes How many open wounds do you currently haveo 1 Approximately how long have you had your woundso 2 months How have you been treating your wound(s) until nowo bandage Has your wound(s) ever healed and then re-openedo No Have you had any lab work done in the past montho Yes Have you tested positive for an antibiotic resistant organism (MRSA, VRE)o No Have you tested positive for osteomyelitis (bone infection)o No Have you had any tests for circulation on your legso No Have you had other problems associated with your woundso Infection, Swelling Constitutional Symptoms (General Health) Complaints and Symptoms: Negative for: Fever; Chills Medical  History: Past Medical History Notes: Radiation and Chemo for Metastatic Melanoma Eyes Medical History: Negative for: Cataracts; Glaucoma; Optic Neuritis Ear/Nose/Mouth/Throat Medical History: Negative for:  Chronic sinus problems/congestion; Middle ear problems Hematologic/Lymphatic Medical History: Negative for: Anemia; Hemophilia; Human Immunodeficiency Virus; Lymphedema; Sickle Cell Disease Respiratory Complaints and Symptoms: No Complaints or Symptoms Medical History: Negative for: Aspiration; Asthma; Chronic Obstructive Pulmonary Disease (COPD); Pneumothorax; Sleep Apnea; Tuberculosis Cardiovascular Louis Davies, Louis Davies ([id number]) Complaints and Symptoms: No Complaints or Symptoms Medical History: Negative for: Angina; Arrhythmia; Congestive Heart Failure; Coronary Artery Disease; Deep Vein Thrombosis; Hypertension; Hypotension; Myocardial Infarction; Peripheral Arterial Disease; Peripheral Venous Disease; Phlebitis; Vasculitis Gastrointestinal Medical History: Negative for: Cirrhosis ; Colitis; Crohnos; Hepatitis A; Hepatitis B; Hepatitis C Endocrine Medical History: Negative for: Type I Diabetes; Type II Diabetes Genitourinary Medical History: Negative for: End Stage Renal Disease Immunological Medical History: Negative for: Lupus Erythematosus; Raynaudos; Scleroderma Integumentary (Skin) Medical History: Negative for: History of Burn; History of pressure wounds Musculoskeletal Medical History: Positive for: Osteoarthritis - left knee Negative for: Gout; Rheumatoid Arthritis; Osteomyelitis Neurologic Medical History: Negative for: Dementia; Neuropathy; Quadriplegia; Paraplegia; Seizure Disorder Oncologic Medical History: Positive for: Received Chemotherapy; Received Radiation - Axillary Psychiatric Complaints and Symptoms: No Complaints or Symptoms Medical History: Negative for: Anorexia/bulimia; Confinement Anxiety Immunizations Pneumococcal  VaccineZADE, Louis Davies ([id number]) Received Pneumococcal Vaccination: Yes Implantable Devices Family and Social History Cancer: Yes - Father; Diabetes: Yes - Mother; Heart Disease: No; Hypertension: Yes - Mother; Kidney Disease: No; Lung Disease: No; Stroke: No; Thyroid Problems: No; Tuberculosis: No; Never smoker; Marital Status - Divorced; Alcohol Use: Never; Drug Use: No History; Caffeine Use: Rarely; Living Will: Yes (Copy provided) Physician Affirmation I have reviewed and agree with the above information. Electronic Signature(s) Signed: 08/24/2018 11:39:33 AM By: Worthy Keeler PA-C Signed: 08/26/2018 10:02:59 AM By: Gretta Cool, BSN, RN, CWS, Kim RN, BSN Entered By: Worthy Keeler on 08/23/2018 17:38:27 Louis Davies, Louis Davies ([id number]) -------------------------------------------------------------------------------- SuperBill Details Patient Name: Louis Davies Date of Service: 08/23/2018 Medical Record Number: [id number] Patient Account Number: [id number] Date of Birth/Sex: 02-15-1951 (67 y.o. 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

## 2018-08-31 ENCOUNTER — Ambulatory Visit (INDEPENDENT_AMBULATORY_CARE_PROVIDER_SITE_OTHER): Payer: Medicare Other | Admitting: Surgery

## 2018-08-31 ENCOUNTER — Encounter: Payer: Self-pay | Admitting: Surgery

## 2018-08-31 ENCOUNTER — Telehealth: Payer: Self-pay | Admitting: Pharmacy Technician

## 2018-08-31 ENCOUNTER — Other Ambulatory Visit: Payer: Self-pay

## 2018-08-31 ENCOUNTER — Ambulatory Visit: Payer: Medicare Other | Admitting: Occupational Therapy

## 2018-08-31 VITALS — BP 134/69 | HR 79 | Temp 97.9°F | Ht 71.0 in | Wt 178.0 lb

## 2018-08-31 DIAGNOSIS — I890 Lymphedema, not elsewhere classified: Principal | ICD-10-CM

## 2018-08-31 DIAGNOSIS — C439 Malignant melanoma of skin, unspecified: Secondary | ICD-10-CM

## 2018-08-31 DIAGNOSIS — S41102S Unspecified open wound of left upper arm, sequela: Secondary | ICD-10-CM

## 2018-08-31 DIAGNOSIS — E8989 Other postprocedural endocrine and metabolic complications and disorders: Secondary | ICD-10-CM

## 2018-08-31 DIAGNOSIS — C799 Secondary malignant neoplasm of unspecified site: Secondary | ICD-10-CM

## 2018-08-31 NOTE — Telephone Encounter (Addendum)
Oral Oncology Patient Advocate Encounter   Was successful in securing patient an $43 grant from Patient [facility]) to provide copayment coverage for Zelboraf.  This will keep the out of pocket expense at $0.     The billing information is as follows and has been shared with [facility].   Member ID: [id number] Group ID: [id number] RxBin: [id number] Dates of Eligibility: 06/02/18 through 08/31/19  [place] Patient [place] Phone [phone] Fax [phone] 08/31/2018 10:06 AM

## 2018-08-31 NOTE — Therapy (Signed)
[place] MAIN [facility] SERVICES [address] [place], [place], [address] Phone: [phone]   Fax:  [phone]  Occupational Therapy Treatment  Patient Details  Name: Louis Davies MRN: [id number] Date of Birth: [birth year] No data recorded  Encounter Date: 08/31/2018  OT End of Session - 08/31/18 1649    Visit Number  19    Number of Visits  36    Date for OT Re-Evaluation  09/27/18    OT Start Time  0200    OT Stop Time  0300    OT Time Calculation (min)  60 min    Activity Tolerance  Patient tolerated treatment well;Patient limited by pain;Treatment limited secondary to medical complications (Comment)    Behavior During Therapy  [facility] for tasks assessed/performed       Past Medical History:  Diagnosis Date  . Anemia   . Arthritis   . Cancer ([place])   . Complication of anesthesia   . Perforated viscus   . PONV (postoperative nausea and vomiting)     Past Surgical History:  Procedure Laterality Date  . AXILLARY LYMPH NODE DISSECTION Left 06/26/2017   Procedure: EXCISION OF LEFT AXILLARY MASS;  Surgeon: Johnathan Hausen, MD;  Location: [facility] ORS;  Service: General;  Laterality: Left;  . DIAGNOSTIC LAPAROSCOPY    . EMBOLIZATION Left 05/11/2018   Procedure: EMBOLIZATION;  Surgeon: Katha Cabal, MD;  Location: [facility];  Service: Cardiovascular;  Laterality: Left;  . LAPAROTOMY N/A 10/14/2017   Procedure: EXPLORATORY LAPAROTOMY;  Surgeon: Clayburn Pert, MD;  Location: [facility] ORS;  Service: General;  Laterality: N/A;  . VASECTOMY      There were no vitals filed for this visit.  Subjective Assessment - 08/31/18 1646    Subjective   Pt presents for OT visit 19/36 for Complete Decongestive Therapy (CDT) for melanoma-related  LUE/LUq lymphedema (LE). Pt states ther is no change in condition of wound since last visit.    Currently in Pain?  Yes    Pain Score  --   not rated numerically   Pain Location  Axilla    Pain  Orientation  Left    Pain Onset  Yesterday   post surgical   Pain Frequency  Intermittent                   OT Treatments/Exercises (OP) - 08/31/18 0001      ADLs   ADL Education Given  Yes      Manual Therapy   Manual Therapy  Edema management;Soft tissue mobilization;Myofascial release;Manual Lymphatic Drainage (MLD)    Manual therapy comments  very gentle myofacial release to lymphatic cording visible and palpable in antecubital fossa on affected L arm. Minimal strokes in L axilla area due to pain. Drainage from axillary wound more malodorous than typical today and more plentiful. Arm swelling well controlled. L axilla and adjacent chest wall mildly swollen. Skin temp is cool to palpation and no erythema observed in undressed areas adjacent to wound.     Manual Lymphatic Drainage (MLD)  MLD in combination with myofacial release to LUE,/ LUQ as established    Compression Bandaging  wound dressing staurated w/ drainage by end of visit. Applied doubled piece of cotton stockinett over dressing with paper toape on top of tape, not on skin, to assist Pt with getting home without soiling clothing.             OT Education - 08/31/18 1649  Education Details  Continued skilled Pt/caregiver education  And LE ADL training throughout visit for lymphedema self care/ home program, including compression wrapping, compression garment and device wear/care, lymphatic pumping ther ex, simple self-MLD, and skin care. Discussed progress towards goals.     Person(s) Educated  Patient    Methods  Explanation;Demonstration;Handout;Tactile cues;Verbal cues    Comprehension  Verbalized understanding;Need further instruction;Returned demonstration;Verbal cues required;Tactile cues required          OT Long Term Goals - 07/29/18 1637      OT LONG TERM GOAL #1   Title  Pt will be able to apply multi-layer short stretch compression wraps using correct gradient techniques with independently  , but with extra time  to achieve optimal limb volume reduction during Intensive Phase CDT, and to return affected limb(s) , as closely as possible, to premorbid size and shape.    Baseline  Dependent    Time  2    Period  Weeks    Status  Achieved      OT LONG TERM GOAL #2   Title  Pt will demonstrate understanding of lymphedema (LE) precautions / prevention principals, including signs / symptoms of cellulitis infection with modified independence using LE Workbook as printed reference to identify 6 precautions without verbal cues by end of 3rd  OT Rx visit.     Baseline  no knowledge    Time  3    Period  Days    Status  Achieved      OT LONG TERM GOAL #3   Title  Pt to achieve no less than 10% limb volume reduction in affected limb(s) during Intensive Phase CDT to control limb swelling, to improve tissue integrity and immune function, to improve ADLs performance and to improve functional mobility/ transfers.    Baseline  dependent    Time  12    Period  Weeks    Status  Partially Met      OT LONG TERM GOAL #4   Title  Pt will achieve 100% compliance with daily LE self-care home program components, including proper skin care, simple self-MLD, gradient compression wraps/ garments, and therapeutic exercise to ensure optimal Intensive Phase limb volume reduction to expedite compression garment/ device fitting.    Baseline  Max A    Time  12    Period  Weeks    Status  On-going      OT LONG TERM GOAL #5   Title  Pt will demonstrate competent use of assistive devices during LE self-care training to improve ability to don and doff compression garments/devices with modified assistance to ensure optimal LE self-management over time.    Baseline  Max A    Time  12    Status  On-going      OT LONG TERM GOAL #6   Title  Pt will retain optimal limb volume reductions achieved during Intensive Phase CDT with no more than 3% volume increase  without CG assistance to limit LE progression and  further functional decline.    Baseline  Dependent    Time  6    Period  Months    Status  On-going            Plan - 08/31/18 1650    Clinical Impression Statement  Wound    with increased odor today. Dressing saturated at end of session. Applied clean doubled piece of cotton stockinett to get pationt home with out soiling clothing. Provided MLD in supine  and sidelying as established using adjacent L AAA, L AI and L PAA. Provided MR at L antecubital fossa and volar forearm in effort to release entrapped cording and reduce infglamation in affected arm. Pt agrees with plan to reduce frequency of  OT for LE care to 2 x weekly, but to extend duration through month of December  to continue to stimulate LUE/LUQ lymphatic function to facilitate wound healing, reduce infection risk and control swelling. Will discuss details next visit.    Occupational performance deficits (Please refer to evaluation for details):  ADL's;Leisure;Social Participation;IADL's;Rest and Sleep;Work;Other   body image   Rehab Potential  Good    OT Frequency  3x / week    OT Duration  Other (comment)   and PRN   OT Treatment/Interventions  Self-care/ADL training;Therapeutic exercise;Manual lymph drainage;Compression bandaging;Patient/family education;Other (comment);Energy conservation;Therapeutic activities;Balance training;DME and/or AE instruction;Manual Therapy;Passive range of motion    Clinical Decision Making  Multiple treatment options, significant modification of task necessary    Recommended Other Services  fit with custom, clat knit LUE compression sleeve and HOS device. Consider seamless compression glove and vest. Consider kinesiotape during Intensive Phase    Consulted and Agree with Plan of Care  Patient       Patient will benefit from skilled therapeutic intervention in order to improve the following deficits and impairments:  Decreased endurance, Decreased mobility, Decreased skin integrity, Impaired  sensation, Decreased knowledge of precautions, Decreased range of motion, Decreased scar mobility, Increased edema, Pain, Decreased activity tolerance, Decreased knowledge of use of DME, Decreased strength, Impaired flexibility, Impaired UE functional use, Decreased psychosocial skills, Decreased balance, Other (comment)(balance and gait negatively impacted by guarding  posture , impaired armswing and body asymmetry)  Visit Diagnosis: Lymphedema of upper extremity following lymphadenectomy    Problem List Patient Active Problem List   Diagnosis Date Noted  . Anemia 05/10/2018  . Ileus ([place])   . Constipation 10/10/2017  . Metastatic melanoma ([place]) 07/07/2017  . Axillary mass, left 06/26/2017  . Chronic pain of left knee 12/14/2015  . Influenza vaccination declined 11/27/2015    Andrey Spearman, MS, OTR/L, [facility] 08/31/18 4:56 PM  [place] MAIN [facility] SERVICES [address] [place], [place], [address] Phone: [phone]   Fax:  [phone]  Name: JI FELDNER MRN: [id number] Date of Birth: 1951/02/10

## 2018-08-31 NOTE — Patient Instructions (Signed)
Return as needed.The patient is aware to call back for any questions or concerns.  

## 2018-09-02 ENCOUNTER — Ambulatory Visit: Payer: Medicare Other | Admitting: Occupational Therapy

## 2018-09-02 DIAGNOSIS — E8989 Other postprocedural endocrine and metabolic complications and disorders: Secondary | ICD-10-CM | POA: Diagnosis not present

## 2018-09-02 DIAGNOSIS — I890 Lymphedema, not elsewhere classified: Secondary | ICD-10-CM

## 2018-09-02 NOTE — Therapy (Signed)
[place] MAIN [facility] SERVICES [address] [place], [place], [address] Phone: [phone]   Fax:  [phone]  Occupational Therapy Treatment Note and Progress Report  Patient Details  Name: Louis Davies MRN: [id number] Date of Birth: 02/26/1951 No data recorded  Encounter Date: 09/02/2018  OT End of Session - 09/02/18 1619    Visit Number  20    Number of Visits  36    Date for OT Re-Evaluation  09/27/18    OT Start Time  0205    OT Stop Time  0305    OT Time Calculation (min)  60 min    Activity Tolerance  Patient tolerated treatment well;Patient limited by pain;Treatment limited secondary to medical complications (Comment)    Behavior During Therapy  [facility] for tasks assessed/performed       Past Medical History:  Diagnosis Date  . Anemia   . Arthritis   . Cancer ([place])   . Complication of anesthesia   . Perforated viscus   . PONV (postoperative nausea and vomiting)     Past Surgical History:  Procedure Laterality Date  . AXILLARY LYMPH NODE DISSECTION Left 06/26/2017   Procedure: EXCISION OF LEFT AXILLARY MASS;  Surgeon: Johnathan Hausen, MD;  Location: [facility] ORS;  Service: General;  Laterality: Left;  . DIAGNOSTIC LAPAROSCOPY    . EMBOLIZATION Left 05/11/2018   Procedure: EMBOLIZATION;  Surgeon: Katha Cabal, MD;  Location: [place] [facility];  Service: Cardiovascular;  Laterality: Left;  . LAPAROTOMY N/A 10/14/2017   Procedure: EXPLORATORY LAPAROTOMY;  Surgeon: Clayburn Pert, MD;  Location: [facility] ORS;  Service: General;  Laterality: N/A;  . VASECTOMY      There were no vitals filed for this visit.  Subjective Assessment - 09/02/18 1616    Subjective   Pt presents for OT visit 20/36 for Complete Decongestive Therapy (CDT) for melanoma-related  LUE/LUq lymphedema (LE). Pt brings remade day and night time sleeves to clinic for fitting and assessment.    Pain Onset  Yesterday   post surgical                   OT Treatments/Exercises (OP) - 09/02/18 0001      Manual Therapy   Manual Therapy  Edema management;Manual Lymphatic Drainage (MLD)    Manual therapy comments  very gentle myofacial release to lymphatic cording visible and palpable in antecubital fossa on affected L arm. Minimal strokes in L axilla area due to pain. Drainage from axillary wound more malodorous than typical today and more plentiful. Arm swelling well controlled. L axilla and adjacent chest wall mildly swollen. Skin temp is cool to palpation and no erythema observed in undressed areas adjacent to wound.     Manual Lymphatic Drainage (MLD)  MLD in combination with myofacial release to LUE,/ LUQ as established    Compression Bandaging  fitted remade ccl 2 flat knit Elvarex sleeve and Jobst RELAX HOS device.             OT Education - 09/02/18 1618    Education Details  Pt edu for donning and doffing daytime sleeve amnd glove and HOS device usibng AE and supportive positioning. Pt edu for garment/ device wear regime and laundry care    Person(s) Educated  Patient    Methods  Explanation;Demonstration;Handout;Tactile cues;Verbal cues    Comprehension  Verbalized understanding;Need further instruction;Returned demonstration;Verbal cues required;Tactile cues required          OT Long Term Goals -  09/02/18 1623      OT LONG TERM GOAL #1   Title  Pt will be able to apply multi-layer short stretch compression wraps using correct gradient techniques with independently , but with extra time  to achieve optimal limb volume reduction during Intensive Phase CDT, and to return affected limb(s) , as closely as possible, to premorbid size and shape.    Baseline  Dependent    Time  2    Period  Weeks    Status  Achieved      OT LONG TERM GOAL #2   Title  Pt will demonstrate understanding of lymphedema (LE) precautions / prevention principals, including signs / symptoms of cellulitis infection with  modified independence using LE Workbook as printed reference to identify 6 precautions without verbal cues by end of 3rd  OT Rx visit.     Baseline  no knowledge    Time  3    Period  Days    Status  Achieved      OT LONG TERM GOAL #3   Title  Pt to achieve no less than 10% limb volume reduction in affected limb(s) during Intensive Phase CDT to control limb swelling, to improve tissue integrity and immune function, to improve ADLs performance and to improve functional mobility/ transfers.    Baseline  dependent    Time  12    Period  Weeks    Status  Partially Met      OT LONG TERM GOAL #4   Title  Pt will achieve 100% compliance with daily LE self-care home program components, including proper skin care, simple self-MLD, gradient compression wraps/ garments, and therapeutic exercise to ensure optimal Intensive Phase limb volume reduction to expedite compression garment/ device fitting.    Baseline  Max A    Time  12    Period  Weeks    Status  Partially Met      OT LONG TERM GOAL #5   Title  Pt will demonstrate competent use of assistive devices during LE self-care training to improve ability to don and doff compression garments/devices with modified assistance to ensure optimal LE self-management over time.    Baseline  Max A    Time  12    Status  Achieved      OT LONG TERM GOAL #6   Title  Pt will retain optimal limb volume reductions achieved during Intensive Phase CDT with no more than 3% volume increase  without CG assistance to limit LE progression and further functional decline.    Baseline  Dependent    Time  6    Period  Months    Status  On-going            Plan - 09/02/18 1619    Clinical Impression Statement  f Pt continues to make steady progress towards unmet goals. Limb volume is well reduced, but Pt has not yet achieved 10% reduction goal. He continues to build tolerance fr use of comprression garments. Remade Elvarex flat knit garment fitted today is more  comfortable than loaned circular knit garment. See LTG section for detailed progress towards OT goals. Ftted remade ccl 2 flat knit Elvarex soft, compression arm sleeve, ccl 2 Elvarex soft plus glove and Jobst RELAX HOS device.. Garments and devices   fit well and Pt is able to don and doff them using assistive devices after skilled ed. Pt verbalized understanding of wear instructions and laundry instructions. Provided MLD to LUQ iand LUE  in supine and sidelying w/ emphasis to proxcimal watersheds without difficulty. Pt  has fewer complaints re axillary wound today. Cont 2 x weekly. Commplete compression assessment next week.    Occupational performance deficits (Please refer to evaluation for details):  ADL's;Leisure;Social Participation;IADL's;Rest and Sleep;Work;Other   body image   Rehab Potential  Good    OT Frequency  3x / week    OT Duration  Other (comment)   and PRN   OT Treatment/Interventions  Self-care/ADL training;Therapeutic exercise;Manual lymph drainage;Compression bandaging;Patient/family education;Other (comment);Energy conservation;Therapeutic activities;Balance training;DME and/or AE instruction;Manual Therapy;Passive range of motion    Clinical Decision Making  Multiple treatment options, significant modification of task necessary    Recommended Other Services  fit with custom, clat knit LUE compression sleeve and HOS device. Consider seamless compression glove and vest. Consider kinesiotape during Intensive Phase    Consulted and Agree with Plan of Care  Patient       Patient will benefit from skilled therapeutic intervention in order to improve the following deficits and impairments:  Decreased endurance, Decreased mobility, Decreased skin integrity, Impaired sensation, Decreased knowledge of precautions, Decreased range of motion, Decreased scar mobility, Increased edema, Pain, Decreased activity tolerance, Decreased knowledge of use of DME, Decreased strength, Impaired  flexibility, Impaired UE functional use, Decreased psychosocial skills, Decreased balance, Other (comment)(balance and gait negatively impacted by guarding  posture , impaired armswing and body asymmetry)  Visit Diagnosis: Lymphedema, not elsewhere classified    Problem List Patient Active Problem List   Diagnosis Date Noted  . Anemia 05/10/2018  . Ileus ([place])   . Constipation 10/10/2017  . Metastatic melanoma ([place]) 07/07/2017  . Axillary mass, left 06/26/2017  . Chronic pain of left knee 12/14/2015  . Influenza vaccination declined 11/27/2015    Andrey Spearman, MS, OTR/L, [facility] 09/02/18 4:27 PM   [place] MAIN [facility] SERVICES [address] [place], [place], [address] Phone: [phone]   Fax:  [phone]  Name: Louis Davies MRN: [id number] Date of Birth: 08-17-1951

## 2018-09-02 NOTE — Telephone Encounter (Signed)
Left message on home phone to return my call.

## 2018-09-03 NOTE — Telephone Encounter (Signed)
Oral Chemotherapy Pharmacist Encounter   Patient returned call, I informed him of the copay assistance from [facility].    Darl Pikes, PharmD, BCPS, [facility] Hematology/Oncology Clinical Pharmacist [facility]/HP/AP Oral [facility] [phone]  09/03/2018 4:09 PM

## 2018-09-06 ENCOUNTER — Ambulatory Visit: Payer: Medicare Other | Admitting: Occupational Therapy

## 2018-09-06 ENCOUNTER — Encounter: Payer: Medicare Other | Admitting: Physician Assistant

## 2018-09-06 DIAGNOSIS — M1990 Unspecified osteoarthritis, unspecified site: Secondary | ICD-10-CM | POA: Diagnosis not present

## 2018-09-06 DIAGNOSIS — C773 Secondary and unspecified malignant neoplasm of axilla and upper limb lymph nodes: Secondary | ICD-10-CM | POA: Diagnosis not present

## 2018-09-06 DIAGNOSIS — L98492 Non-pressure chronic ulcer of skin of other sites with fat layer exposed: Secondary | ICD-10-CM | POA: Diagnosis not present

## 2018-09-06 DIAGNOSIS — E8989 Other postprocedural endocrine and metabolic complications and disorders: Secondary | ICD-10-CM | POA: Diagnosis not present

## 2018-09-06 DIAGNOSIS — L98495 Non-pressure chronic ulcer of skin of other sites with muscle involvement without evidence of necrosis: Secondary | ICD-10-CM | POA: Diagnosis not present

## 2018-09-06 DIAGNOSIS — Z923 Personal history of irradiation: Secondary | ICD-10-CM | POA: Diagnosis not present

## 2018-09-06 DIAGNOSIS — I890 Lymphedema, not elsewhere classified: Secondary | ICD-10-CM | POA: Diagnosis not present

## 2018-09-06 NOTE — Therapy (Signed)
[place] MAIN [facility] SERVICES [address] [place], [place], [address] Phone: [phone]   Fax:  [phone]  Occupational Therapy Treatment  Patient Details  Name: Louis Davies MRN: [id number] Date of Birth: [birth year] No data recorded  Encounter Date: 09/06/2018  OT End of Session - 09/06/18 1615    Visit Number  21    Number of Visits  36    Date for OT Re-Evaluation  09/27/18    OT Start Time  0200    OT Stop Time  0300    OT Time Calculation (min)  60 min    Activity Tolerance  Patient tolerated treatment well;Patient limited by pain;Treatment limited secondary to medical complications (Comment)    Behavior During Therapy  [facility] for tasks assessed/performed       Past Medical History:  Diagnosis Date  . Anemia   . Arthritis   . Cancer ([place])   . Complication of anesthesia   . Perforated viscus   . PONV (postoperative nausea and vomiting)     Past Surgical History:  Procedure Laterality Date  . AXILLARY LYMPH NODE DISSECTION Left 06/26/2017   Procedure: EXCISION OF LEFT AXILLARY MASS;  Surgeon: Johnathan Hausen, MD;  Location: [facility] ORS;  Service: General;  Laterality: Left;  . DIAGNOSTIC LAPAROSCOPY    . EMBOLIZATION Left 05/11/2018   Procedure: EMBOLIZATION;  Surgeon: Katha Cabal, MD;  Location: [place] [facility] LAB;  Service: Cardiovascular;  Laterality: Left;  . LAPAROTOMY N/A 10/14/2017   Procedure: EXPLORATORY LAPAROTOMY;  Surgeon: Clayburn Pert, MD;  Location: [facility] ORS;  Service: General;  Laterality: N/A;  . VASECTOMY      There were no vitals filed for this visit.  Subjective Assessment - 09/06/18 1611    Subjective   Pt presents for OT visit 21/36 for Complete Decongestive Therapy (CDT) for melanoma-related  LUE/LUq lymphedema (LE). Pt reports large amount of tissue at axillary wound sloughed off over the weekend resulting in much large skin opening than he realized. Pt has wound clinic appointment afte OT  session. Pt states, "That sleeve I got last week feels a lot better than the one I borrowed. I forgot to put it on today after I got a shower."    Currently in Pain?  Yes   not rated   Pain Location  Axilla    Pain Orientation  Left    Pain Descriptors / Indicators  Heaviness;Tender;Tightness;Tiring;Other (Comment)   fullness   Pain Type  Chronic pain    Pain Onset  Yesterday   post surgical   Pain Frequency  Intermittent                   OT Treatments/Exercises (OP) - 09/06/18 0001      ADLs   ADL Education Given  Yes      Manual Therapy   Manual Therapy  Edema management;Manual Lymphatic Drainage (MLD);Compression Bandaging    Manual therapy comments  very gentle myofacial release to lymphatic cording visible and palpable in antecubital fossa on affected L arm. Minimal strokes in L axilla area due to pain. Drainage from axillary wound more malodorous than typical today and more plentiful. Arm swelling well controlled. L axilla and adjacent chest wall mildly swollen. Skin temp is cool to palpation and no erythema observed in undressed areas adjacent to wound.     Manual Lymphatic Drainage (MLD)  MLD in combination with myofacial release to LUE,/ LUQ as established    Compression Bandaging  no sleeve after session as Pt is on his way to wound clinic apt. Pt agrees to don garment when he arrives home this afternoon.             OT Education - 09/06/18 1615    Education Details  Continued skilled Pt/caregiver education  And LE ADL training throughout visit for lymphedema self care/ home program, including compression wrapping, compression garment and device wear/care, lymphatic pumping ther ex, simple self-MLD, and skin care. Discussed progress towards goals.     Person(s) Educated  Patient    Methods  Explanation;Demonstration;Handout;Tactile cues;Verbal cues    Comprehension  Verbalized understanding;Need further instruction;Returned demonstration;Verbal cues  required;Tactile cues required          OT Long Term Goals - 09/02/18 1623      OT LONG TERM GOAL #1   Title  Pt will be able to apply multi-layer short stretch compression wraps using correct gradient techniques with independently , but with extra time  to achieve optimal limb volume reduction during Intensive Phase CDT, and to return affected limb(s) , as closely as possible, to premorbid size and shape.    Baseline  Dependent    Time  2    Period  Weeks    Status  Achieved      OT LONG TERM GOAL #2   Title  Pt will demonstrate understanding of lymphedema (LE) precautions / prevention principals, including signs / symptoms of cellulitis infection with modified independence using LE Workbook as printed reference to identify 6 precautions without verbal cues by end of 3rd  OT Rx visit.     Baseline  no knowledge    Time  3    Period  Days    Status  Achieved      OT LONG TERM GOAL #3   Title  Pt to achieve no less than 10% limb volume reduction in affected limb(s) during Intensive Phase CDT to control limb swelling, to improve tissue integrity and immune function, to improve ADLs performance and to improve functional mobility/ transfers.    Baseline  dependent    Time  12    Period  Weeks    Status  Partially Met      OT LONG TERM GOAL #4   Title  Pt will achieve 100% compliance with daily LE self-care home program components, including proper skin care, simple self-MLD, gradient compression wraps/ garments, and therapeutic exercise to ensure optimal Intensive Phase limb volume reduction to expedite compression garment/ device fitting.    Baseline  Max A    Time  12    Period  Weeks    Status  Partially Met      OT LONG TERM GOAL #5   Title  Pt will demonstrate competent use of assistive devices during LE self-care training to improve ability to don and doff compression garments/devices with modified assistance to ensure optimal LE self-management over time.    Baseline  Max A     Time  12    Status  Achieved      OT LONG TERM GOAL #6   Title  Pt will retain optimal limb volume reductions achieved during Intensive Phase CDT with no more than 3% volume increase  without CG assistance to limit LE progression and further functional decline.    Baseline  Dependent    Time  6    Period  Months    Status  On-going  Plan - 09/06/18 1616    Clinical Impression Statement  Provided MLD to LUQ and LUE n supine and sidelying utilizing AAA, PAA and ipsilateral axiillary-inguinal pathways. Sidelying therapy is limited as Pt is uncomfortable in this position , even with LUE supported with shoulder in neutral. Swelling in LUE is mild and remains unchanged since last session last week. Hand swelling has resolved. Axillary cording is still visible in antecubital fossa and Pt reports ongoing " tightness in axilla. Cont as per POC 2 x week for MLD.    Occupational performance deficits (Please refer to evaluation for details):  ADL's;Leisure;Social Participation;IADL's;Rest and Sleep;Work;Other   body image   Rehab Potential  Good    OT Frequency  3x / week    OT Duration  Other (comment)   and PRN   OT Treatment/Interventions  Self-care/ADL training;Therapeutic exercise;Manual lymph drainage;Compression bandaging;Patient/family education;Other (comment);Energy conservation;Therapeutic activities;Balance training;DME and/or AE instruction;Manual Therapy;Passive range of motion    Clinical Decision Making  Multiple treatment options, significant modification of task necessary    Recommended Other Services  fit with custom, clat knit LUE compression sleeve and HOS device. Consider seamless compression glove and vest. Consider kinesiotape during Intensive Phase    Consulted and Agree with Plan of Care  Patient       Patient will benefit from skilled therapeutic intervention in order to improve the following deficits and impairments:  Decreased endurance, Decreased  mobility, Decreased skin integrity, Impaired sensation, Decreased knowledge of precautions, Decreased range of motion, Decreased scar mobility, Increased edema, Pain, Decreased activity tolerance, Decreased knowledge of use of DME, Decreased strength, Impaired flexibility, Impaired UE functional use, Decreased psychosocial skills, Decreased balance, Other (comment)(balance and gait negatively impacted by guarding  posture , impaired armswing and body asymmetry)  Visit Diagnosis: Lymphedema, not elsewhere classified    Problem List Patient Active Problem List   Diagnosis Date Noted  . Anemia 05/10/2018  . Ileus ([place])   . Constipation 10/10/2017  . Metastatic melanoma ([place]) 07/07/2017  . Axillary mass, left 06/26/2017  . Chronic pain of left knee 12/14/2015  . Influenza vaccination declined 11/27/2015    Andrey Spearman, MS, OTR/L, [facility] 09/06/18 4:22 PM  [place] MAIN [facility] SERVICES [address] [place], [place], [address] Phone: [phone]   Fax:  [phone]  Name: Louis Davies MRN: [id number] Date of Birth: 1951-04-26

## 2018-09-07 ENCOUNTER — Ambulatory Visit: Payer: Medicare Other | Admitting: Occupational Therapy

## 2018-09-07 DIAGNOSIS — I890 Lymphedema, not elsewhere classified: Secondary | ICD-10-CM | POA: Diagnosis not present

## 2018-09-07 DIAGNOSIS — E8989 Other postprocedural endocrine and metabolic complications and disorders: Secondary | ICD-10-CM | POA: Diagnosis not present

## 2018-09-08 NOTE — Therapy (Signed)
[place] MAIN [facility] SERVICES [address] [place], [place], [address] Phone: [phone]   Fax:  [phone]  Occupational Therapy Treatment  Patient Details  Name: Louis Davies MRN: [id number] Date of Birth: 1951/03/05 No data recorded  Encounter Date: 09/07/2018  OT End of Session - 09/07/18 1217    Visit Number  22    Number of Visits  36    Date for OT Re-Evaluation  09/27/18    OT Start Time  0205    OT Stop Time  0305    OT Time Calculation (min)  60 min    Activity Tolerance  Patient tolerated treatment well;Patient limited by pain;Treatment limited secondary to medical complications (Comment)    Behavior During Therapy  [facility] for tasks assessed/performed       Past Medical History:  Diagnosis Date  . Anemia   . Arthritis   . Cancer ([place])   . Complication of anesthesia   . Perforated viscus   . PONV (postoperative nausea and vomiting)     Past Surgical History:  Procedure Laterality Date  . AXILLARY LYMPH NODE DISSECTION Left 06/26/2017   Procedure: EXCISION OF LEFT AXILLARY MASS;  Surgeon: Johnathan Hausen, MD;  Location: [facility] ORS;  Service: General;  Laterality: Left;  . DIAGNOSTIC LAPAROSCOPY    . EMBOLIZATION Left 05/11/2018   Procedure: EMBOLIZATION;  Surgeon: Katha Cabal, MD;  Location: [place] CV LAB;  Service: Cardiovascular;  Laterality: Left;  . LAPAROTOMY N/A 10/14/2017   Procedure: EXPLORATORY LAPAROTOMY;  Surgeon: Clayburn Pert, MD;  Location: [facility] ORS;  Service: General;  Laterality: N/A;  . VASECTOMY      There were no vitals filed for this visit.  Subjective Assessment - 09/07/18 1104    Subjective   Pt presents for OT visit 22/36 for Complete Decongestive Therapy (CDT) for melanoma-related  LUE/LUq lymphedema (LE). Pt reports he attended wound clinic after last visit and provider recommended he see his oncologist again. Pt reports custom sleeve is working well for him and it is much more  comfortable than loaned circular knit sleeve. Pt states he has not needed glove.    Currently in Pain?  Yes   axillary pain persists  and remains unchanged. Arm and hand pain  associated with swelling has resolved. Pt does not rate pain numerically today.   Pain Onset  Yesterday   post surgical                  OT Treatments/Exercises (OP) - 09/08/18 0001      ADLs   ADL Education Given  Yes      Manual Therapy   Manual Therapy  Edema management;Soft tissue mobilization;Myofascial release;Manual Lymphatic Drainage (MLD)    Manual therapy comments  very gentle myofacial release to lymphatic cording visible and palpable in antecubital fossa on affected L arm. Minimal strokes in L axilla area due to pain. Drainage from axillary wound more malodorous than typical today and more plentiful. Arm swelling well controlled. L axilla and adjacent chest wall mildly swollen. Skin temp is cool to palpation and no erythema observed in undressed areas adjacent to wound.     Manual Lymphatic Drainage (MLD)  MLD in combination with myofacial release to LUE,/ LUQ as established    Compression Bandaging  no sleeve after session as Pt is on his way to wound clinic apt. Pt agrees to don garment when he arrives home this afternoon.  OT Education - 09/07/18 1108    Education Details  Continued skilled Pt/caregiver education  And LE ADL training throughout visit for lymphedema self care/ home program, including compression wrapping, compression garment and device wear/care, lymphatic pumping ther ex, simple self-MLD, and skin care. Discussed progress towards goals.     Person(s) Educated  Patient    Methods  Explanation;Demonstration;Handout;Tactile cues;Verbal cues    Comprehension  Verbalized understanding;Need further instruction;Returned demonstration;Verbal cues required;Tactile cues required          OT Long Term Goals - 09/02/18 1623      OT LONG TERM GOAL #1   Title  Pt  will be able to apply multi-layer short stretch compression wraps using correct gradient techniques with independently , but with extra time  to achieve optimal limb volume reduction during Intensive Phase CDT, and to return affected limb(s) , as closely as possible, to premorbid size and shape.    Baseline  Dependent    Time  2    Period  Weeks    Status  Achieved      OT LONG TERM GOAL #2   Title  Pt will demonstrate understanding of lymphedema (LE) precautions / prevention principals, including signs / symptoms of cellulitis infection with modified independence using LE Workbook as printed reference to identify 6 precautions without verbal cues by end of 3rd  OT Rx visit.     Baseline  no knowledge    Time  3    Period  Days    Status  Achieved      OT LONG TERM GOAL #3   Title  Pt to achieve no less than 10% limb volume reduction in affected limb(s) during Intensive Phase CDT to control limb swelling, to improve tissue integrity and immune function, to improve ADLs performance and to improve functional mobility/ transfers.    Baseline  dependent    Time  12    Period  Weeks    Status  Partially Met      OT LONG TERM GOAL #4   Title  Pt will achieve 100% compliance with daily LE self-care home program components, including proper skin care, simple self-MLD, gradient compression wraps/ garments, and therapeutic exercise to ensure optimal Intensive Phase limb volume reduction to expedite compression garment/ device fitting.    Baseline  Max A    Time  12    Period  Weeks    Status  Partially Met      OT LONG TERM GOAL #5   Title  Pt will demonstrate competent use of assistive devices during LE self-care training to improve ability to don and doff compression garments/devices with modified assistance to ensure optimal LE self-management over time.    Baseline  Max A    Time  12    Status  Achieved      OT LONG TERM GOAL #6   Title  Pt will retain optimal limb volume reductions  achieved during Intensive Phase CDT with no more than 3% volume increase  without CG assistance to limit LE progression and further functional decline.    Baseline  Dependent    Time  6    Period  Months    Status  On-going            Plan - 09/07/18 1217    Clinical Impression Statement  Provided MLD to LUQ and LUE n supine and sidelying utilizing AAA, PAA and ipsilateral axiillary-inguinal pathways. Sidelying therapy is limited as Pt is  uncomfortable in this position , even with LUE supported with shoulder in neutral. Swelling in LUE is mild and remains unchanged since last session last week. Hand swelling has resolved. Axillary cording is still visible in antecubital fossa and Pt reports ongoing " tightness in axilla. Cont as per POC 2 x week for MLD.    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    OT Frequency  2x / week    OT Duration  Other (comment)   and PRN   OT Treatment/Interventions  Self-care/ADL training;Therapeutic exercise;Manual lymph drainage;Compression bandaging;Patient/family education;Other (comment);Energy conservation;Therapeutic activities;Balance training;DME and/or AE instruction;Manual Therapy;Passive range of motion    Clinical Decision Making  Multiple treatment options, significant modification of task necessary    Recommended Other Services  fit with custom, clat knit LUE compression sleeve and HOS device. Consider seamless compression glove and vest. Consider kinesiotape  during Intensive Phase    Consulted and Agree with Plan of Care  Patient       Patient will benefit from skilled therapeutic intervention in order to improve the following deficits and impairments:  Decreased endurance, Decreased mobility, Decreased skin integrity, Impaired sensation, Decreased knowledge of precautions, Decreased range of motion, Decreased scar mobility, Increased edema, Pain, Decreased activity tolerance, Decreased knowledge of use of DME, Decreased strength, Impaired flexibility, Impaired UE functional use, Decreased psychosocial skills, Decreased balance, Other (comment)(balance and gait negatively impacted by guarding  posture , impaired armswing and body asymmetry)  Visit Diagnosis: Lymphedema, not elsewhere classified    Problem List Patient Active Problem List   Diagnosis Date Noted  . Anemia 05/10/2018  . Ileus ([place])   . Constipation 10/10/2017  . Metastatic melanoma ([place]) 07/07/2017  . Axillary mass, left 06/26/2017  . Chronic pain of left knee 12/14/2015  . Influenza vaccination declined 11/27/2015    Andrey Spearman, MS, OTR/L, [facility] 09/08/18 12:22 PM   [place] MAIN [facility] SERVICES [address] [place], [place], [address] Phone: [phone]   Fax:  [phone]  Name: ZEBULAN HINSHAW MRN: [id number] Date of Birth: 05-23-1951

## 2018-09-09 ENCOUNTER — Encounter: Payer: Self-pay | Admitting: Surgery

## 2018-09-09 ENCOUNTER — Ambulatory Visit: Payer: Medicare Other | Admitting: Occupational Therapy

## 2018-09-09 NOTE — Progress Notes (Signed)
CREED, KAIL ([id number]) Visit Report for 09/06/2018 Arrival Information Details Patient Name: Louis Davies, Louis Davies Date of Service: 09/06/2018 3:30 PM Medical Record Number: [id number] Patient Account Number: [id number] Date of Birth/Sex: April 11, 1951 (67 y.o. M) Treating RN: Secundino Ginger Primary Care Kemarion Abbey: Alvera Singh Other Clinician: Referring Sedra Morfin: Alvera Singh Treating Chris Narasimhan/Extender: Melburn Hake, HOYT Weeks in Treatment: 11 Visit Information History Since Last Visit Added or deleted any medications: No Patient Arrived: Ambulatory Any new allergies or adverse reactions: No Arrival Time: 15:36 Had a fall or experienced change in No Accompanied By: self activities of daily living that may affect Transfer Assistance: None risk of falls: Patient Identification Verified: Yes Signs or symptoms of abuse/neglect since last visito No Secondary Verification Process Completed: Yes Hospitalized since last visit: No Implantable device outside of the clinic excluding No cellular tissue based products placed in the center since last visit: Has Dressing in Place as Prescribed: Yes Pain Present Now: Yes Notes pt c/o pain to left arm 10/10. Electronic Signature(s) Signed: 09/06/2018 4:14:45 PM By: Secundino Ginger Entered By: Secundino Ginger on 09/06/2018 15:37:51 Louis Davies ([id number]) -------------------------------------------------------------------------------- Clinic Level of Care Assessment Details Patient Name: Louis Davies Date of Service: 09/06/2018 3:30 PM Medical Record Number: [id number] Patient Account Number: [id number] Date of Birth/Sex: 1951-08-03 (67 y.o. M) Treating RN: Cornell Barman Primary Care Shafiq Larch: Alvera Singh Other Clinician: Referring Markeem Noreen: Alvera Singh Treating Rodolphe Edmonston/Extender: Melburn Hake, HOYT Weeks in Treatment: 11 Clinic Level of Care Assessment Items TOOL 4 Quantity Score []  - Use when only an EandM is performed on  FOLLOW-UP visit 0 ASSESSMENTS - Nursing Assessment / Reassessment []  - Reassessment of Co-morbidities (includes updates in patient status) 0 X- 1 5 Reassessment of Adherence to Treatment Plan ASSESSMENTS - Wound and Skin Assessment / Reassessment X - Simple Wound Assessment / Reassessment - one wound 1 5 []  - 0 Complex Wound Assessment / Reassessment - multiple wounds []  - 0 Dermatologic / Skin Assessment (not related to wound area) ASSESSMENTS - Focused Assessment []  - Circumferential Edema Measurements - multi extremities 0 []  - 0 Nutritional Assessment / Counseling / Intervention []  - 0 Lower Extremity Assessment (monofilament, tuning fork, pulses) []  - 0 Peripheral Arterial Disease Assessment (using hand held doppler) ASSESSMENTS - Ostomy and/or Continence Assessment and Care []  - Incontinence Assessment and Management 0 []  - 0 Ostomy Care Assessment and Management (repouching, etc.) PROCESS - Coordination of Care X - Simple Patient / Family Education for ongoing care 1 15 []  - 0 Complex (extensive) Patient / Family Education for ongoing care []  - 0 Staff obtains Programmer, systems, Records, Test Results / Process Orders []  - 0 Staff telephones HHA, Nursing Homes / Clarify orders / etc []  - 0 Routine Transfer to another Facility (non-emergent condition) []  - 0 Routine Hospital Admission (non-emergent condition) []  - 0 New Admissions / Biomedical engineer / Ordering NPWT, Apligraf, etc. []  - 0 Emergency Hospital Admission (emergent condition) X- 1 10 Simple Discharge Coordination Louis Davies, Louis Davies ([id number]) []  - 0 Complex (extensive) Discharge Coordination PROCESS - Special Needs []  - Pediatric / Minor Patient Management 0 []  - 0 Isolation Patient Management []  - 0 Hearing / Language / Visual special needs []  - 0 Assessment of Community assistance (transportation, D/C planning, etc.) []  - 0 Additional assistance / Altered mentation []  - 0 Support Surface(s)  Assessment (bed, cushion, seat, etc.) INTERVENTIONS - Wound Cleansing / Measurement X - Simple Wound Cleansing - one wound 1 5 []  - 0 Complex Wound Cleansing -  multiple wounds X- 1 5 Wound Imaging (photographs - any number of wounds) []  - 0 Wound Tracing (instead of photographs) X- 1 5 Simple Wound Measurement - one wound []  - 0 Complex Wound Measurement - multiple wounds INTERVENTIONS - Wound Dressings []  - Small Wound Dressing one or multiple wounds 0 []  - 0 Medium Wound Dressing one or multiple wounds X- 1 20 Large Wound Dressing one or multiple wounds []  - 0 Application of Medications - topical []  - 0 Application of Medications - injection INTERVENTIONS - Miscellaneous []  - External ear exam 0 []  - 0 Specimen Collection (cultures, biopsies, blood, body fluids, etc.) []  - 0 Specimen(s) / Culture(s) sent or taken to Lab for analysis []  - 0 Patient Transfer (multiple staff / Civil Service fast streamer / Similar devices) []  - 0 Simple Staple / Suture removal (25 or less) []  - 0 Complex Staple / Suture removal (26 or more) []  - 0 Hypo / Hyperglycemic Management (close monitor of Blood Glucose) []  - 0 Ankle / Brachial Index (ABI) - do not check if billed separately X- 1 5 Vital Signs Louis Davies, Louis D. ([id number]) Has the patient been seen at the hospital within the last three years: Yes Total Score: 75 Level Of Care: New/Established - Level 2 Electronic Signature(s) Signed: 09/07/2018 10:18:29 AM By: Gretta Cool, BSN, RN, CWS, Kim RN, BSN Entered By: Gretta Cool, BSN, RN, CWS, Kim on 09/07/2018 07:39:29 Louis Davies ([id number]) -------------------------------------------------------------------------------- Lower Extremity Assessment Details Patient Name: Louis Davies Date of Service: 09/06/2018 3:30 PM Medical Record Number: [id number] Patient Account Number: [id number] Date of Birth/Sex: 1951-01-20 (67 y.o. M) Treating RN: Secundino Ginger Primary Care Martita Brumm: Alvera Singh Other  Clinician: Referring Nicholas Trompeter: Alvera Singh Treating Selby Foisy/Extender: Melburn Hake, HOYT Weeks in Treatment: 11 Electronic Signature(s) Signed: 09/06/2018 4:14:45 PM By: Secundino Ginger Entered By: Secundino Ginger on 09/06/2018 15:44:09 Louis Davies ([id number]) -------------------------------------------------------------------------------- Multi Wound Chart Details Patient Name: Louis Davies Date of Service: 09/06/2018 3:30 PM Medical Record Number: [id number] Patient Account Number: [id number] Date of Birth/Sex: April 04, 1951 (67 y.o. M) Treating RN: Cornell Barman Primary Care Mirage Pfefferkorn: Alvera Singh Other Clinician: Referring Tom Ragsdale: Alvera Singh Treating Teofila Bowery/Extender: STONE III, HOYT Weeks in Treatment: 11 Vital Signs Height(in): 71 Pulse(bpm): 75 Weight(lbs): 185.6 Blood Pressure(mmHg): 126/65 Body Mass Index(BMI): 26 Temperature(F): 98.1 Respiratory Rate 16 (breaths/min): Photos: [1:No Photos] [N/A:N/A] Wound Location: [1:Left Axilla] [N/A:N/A] Wounding Event: [1:Gradually Appeared] [N/A:N/A] Primary Etiology: [1:Malignant Wound] [N/A:N/A] Comorbid History: [1:Osteoarthritis, Received Chemotherapy, Received Radiation] [N/A:N/A] Date Acquired: [1:04/07/2018] [N/A:N/A] Weeks of Treatment: [1:11] [N/A:N/A] Wound Status: [1:Open] [N/A:N/A] Measurements L x W x D [1:3.2x3.5x4.5] [N/A:N/A] (cm) Area (cm) : [1:8.796] [N/A:N/A] Volume (cm) : [1:39.584] [N/A:N/A] % Reduction in Area: [1:-1030.60%] [N/A:N/A] % Reduction in Volume: [1:-2021.30%] [N/A:N/A] Classification: [1:Full Thickness Without Exposed Support Structures] [N/A:N/A] Exudate Amount: [1:Large] [N/A:N/A] Exudate Type: [1:Serosanguineous] [N/A:N/A] Exudate Color: [1:red, brown] [N/A:N/A] Foul Odor After Cleansing: [1:Yes] [N/A:N/A] Odor Anticipated Due to [1:No] [N/A:N/A] Product Use: Wound Margin: [1:Flat and Intact] [N/A:N/A] Granulation Amount: [1:None Present (0%)] [N/A:N/A] Necrotic  Amount: [1:None Present (0%)] [N/A:N/A] Exposed Structures: [1:Fat Layer (Subcutaneous Tissue) Exposed: Yes Fascia: No Tendon: No Muscle: No Joint: No Bone: No] [N/A:N/A] Epithelialization: [1:None] [N/A:N/A] Periwound Skin Texture: [1:Rash: Yes Scarring: Yes] [N/A:N/A] Excoriation: No Induration: No Callus: No Crepitus: No Periwound Skin Moisture: Maceration: Yes N/A N/A Periwound Skin Color: Atrophie Blanche: No N/A N/A Cyanosis: No Ecchymosis: No Erythema: No Hemosiderin Staining: No Mottled: No Pallor: No Rubor: No Tenderness on Palpation: No N/A N/A Wound Preparation: Ulcer Cleansing:  N/A N/A Rinsed/Irrigated with Saline Topical Anesthetic Applied: None, Other: lidocaine 4% Treatment Notes Electronic Signature(s) Signed: 09/07/2018 10:18:29 AM By: Gretta Cool, BSN, RN, CWS, Kim RN, BSN Entered By: Gretta Cool, BSN, RN, CWS, Kim on 09/06/2018 16:04:11 Louis Davies ([id number]) -------------------------------------------------------------------------------- Multi-Disciplinary Care Plan Details Patient Name: Louis Davies Date of Service: 09/06/2018 3:30 PM Medical Record Number: [id number] Patient Account Number: [id number] Date of Birth/Sex: 1950-11-04 (67 y.o. M) Treating RN: Cornell Barman Primary Care Elvia Aydin: Alvera Singh Other Clinician: Referring Bellagrace Sylvan: Alvera Singh Treating Jaryiah Mehlman/Extender: Melburn Hake, HOYT Weeks in Treatment: 11 Active Inactive ` Abuse / Safety / Falls / Self Care Management Nursing Diagnoses: Potential for falls Goals: Patient will not experience any injury related to falls Date Initiated: 06/15/2018 Target Resolution Date: 09/25/2018 Goal Status: Active Interventions: Assess Activities of Daily Living upon admission and as needed Assess fall risk on admission and as needed Assess: immobility, friction, shearing, incontinence upon admission and as needed Assess impairment of mobility on admission and as needed per  policy Notes: ` Nutrition Nursing Diagnoses: Imbalanced nutrition Goals: Patient/caregiver agrees to and verbalizes understanding of need to use nutritional supplements and/or vitamins as prescribed Date Initiated: 06/15/2018 Target Resolution Date: 09/25/2018 Goal Status: Active Interventions: Assess patient nutrition upon admission and as needed per policy Notes: ` Orientation to the Wound Care Program Nursing Diagnoses: Knowledge deficit related to the wound healing center program Goals: Patient/caregiver will verbalize understanding of the [place] Program Date Initiated: 06/15/2018 Target Resolution Date: 06/26/2018 Louis Davies, Louis Davies ([id number]) Goal Status: Active Interventions: Provide education on orientation to the wound center Notes: ` Wound/Skin Impairment Nursing Diagnoses: Impaired tissue integrity Knowledge deficit related to ulceration/compromised skin integrity Goals: Ulcer/skin breakdown will have a volume reduction of 80% by week 12 Date Initiated: 06/15/2018 Target Resolution Date: 09/18/2018 Goal Status: Active Interventions: Assess patient/caregiver ability to perform ulcer/skin care regimen upon admission and as needed Assess ulceration(s) every visit Notes: Electronic Signature(s) Signed: 09/07/2018 10:18:29 AM By: Gretta Cool, BSN, RN, CWS, Kim RN, BSN Entered By: Gretta Cool, BSN, RN, CWS, Kim on 09/06/2018 16:04:04 Louis Davies ([id number]) -------------------------------------------------------------------------------- Pain Assessment Details Patient Name: Louis Davies Date of Service: 09/06/2018 3:30 PM Medical Record Number: [id number] Patient Account Number: [id number] Date of Birth/Sex: 1951/03/27 (67 y.o. M) Treating RN: Secundino Ginger Primary Care Violanda Bobeck: Alvera Singh Other Clinician: Referring Johanne Mcglade: Alvera Singh Treating Maxyne Derocher/Extender: Melburn Hake, HOYT Weeks in Treatment: 11 Active Problems Location of Pain  Severity and Description of Pain Patient Has Paino Yes Site Locations Rate the pain. Current Pain Level: 10 Worst Pain Level: 10 Least Pain Level: 10 Tolerable Pain Level: 10 Pain Management and Medication Current Pain Management: Goals for Pain Management pt stated takes oxycodone for pain management. encouraged to see primary PRN. Electronic Signature(s) Signed: 09/06/2018 4:14:45 PM By: Secundino Ginger Entered By: Secundino Ginger on 09/06/2018 15:39:30 Louis Davies ([id number]) -------------------------------------------------------------------------------- Wound Assessment Details Patient Name: Louis Davies Date of Service: 09/06/2018 3:30 PM Medical Record Number: [id number] Patient Account Number: [id number] Date of Birth/Sex: Apr 07, 1951 (67 y.o. M) Treating RN: Secundino Ginger Primary Care Kalsey Lull: Alvera Singh Other Clinician: Referring Genesis Novosad: Alvera Singh Treating Damiel Barthold/Extender: STONE III, HOYT Weeks in Treatment: 11 Wound Status Wound Number: 1 Primary Malignant Wound Etiology: Wound Location: Left Axilla Wound Status: Open Wounding Event: Gradually Appeared Comorbid Osteoarthritis, Received Chemotherapy, Date Acquired: 04/07/2018 History: Received Radiation Weeks Of Treatment: 11 Clustered Wound: No Photos Photo Uploaded By: Secundino Ginger on 09/06/2018 16:12:46 Wound Measurements Length: (cm) 3.2 Width: (cm) 3.5  Depth: (cm) 4.5 Area: (cm) 8.796 Volume: (cm) 39.584 % Reduction in Area: -1030.6% % Reduction in Volume: -2021.3% Epithelialization: None Tunneling: No Undermining: No Wound Description Full Thickness Without Exposed Support Foul Od Classification: Structures Due to Wound Margin: Flat and Intact Slough/ Exudate Large Amount: Exudate Type: Serosanguineous Exudate Color: red, brown or After Cleansing: Yes Product Use: No Fibrino Yes Wound Bed Granulation Amount: None Present (0%) Exposed Structure Necrotic Amount: None Present  (0%) Fascia Exposed: No Fat Layer (Subcutaneous Tissue) Exposed: Yes Tendon Exposed: No Muscle Exposed: No Joint Exposed: No Bone Exposed: No Louis Davies, Louis D. ([id number]) Periwound Skin Texture Texture Color No Abnormalities Noted: No No Abnormalities Noted: No Callus: No Atrophie Blanche: No Crepitus: No Cyanosis: No Excoriation: No Ecchymosis: No Induration: No Erythema: No Rash: Yes Hemosiderin Staining: No Scarring: Yes Mottled: No Pallor: No Moisture Rubor: No No Abnormalities Noted: No Maceration: Yes Wound Preparation Ulcer Cleansing: Rinsed/Irrigated with Saline Topical Anesthetic Applied: None, Other: lidocaine 4%, Electronic Signature(s) Signed: 09/06/2018 4:14:45 PM By: Secundino Ginger Entered By: Secundino Ginger on 09/06/2018 15:52:25 Louis Davies ([id number]) -------------------------------------------------------------------------------- Vitals Details Patient Name: Louis Davies Date of Service: 09/06/2018 3:30 PM Medical Record Number: [id number] Patient Account Number: [id number] Date of Birth/Sex: [birth year] (67 y.o. M) Treating RN: Secundino Ginger Primary Care Laraine Samet: Alvera Singh Other Clinician: Referring Caydn Justen: Alvera Singh Treating Micheal Murad/Extender: STONE III, HOYT Weeks in Treatment: 11 Vital Signs Time Taken: 15:39 Temperature (F): 98.1 Height (in): 71 Pulse (bpm): 75 Weight (lbs): 185.6 Respiratory Rate (breaths/min): 16 Body Mass Index (BMI): 25.9 Blood Pressure (mmHg): 126/65 Reference Range: 80 - 120 mg / dl Electronic Signature(s) Signed: 09/06/2018 4:14:45 PM By: Secundino Ginger Entered By: Secundino Ginger on 09/06/2018 15:42:12

## 2018-09-09 NOTE — Progress Notes (Signed)
Surgical Clinic Progress/Follow-up Note   HPI:  67 y.o. Male presents to clinic for follow-up evaluation of Left axillary wound 1 week after evaluation with what appeared to be Left axillary tissue/tumor necrosis without evidence to suggest infection. Patient reports he has been packing his wound as instructed, denies any pain, fever/chills, surrounding redness, or purulent drainage with the same type of necrotic-appearing material seen in the office last week.  Review of Systems:  Constitutional: denies any other weight loss, fever, chills, or sweats  Eyes: denies any other vision changes, history of eye injury  ENT: denies sore throat, hearing problems  Respiratory: denies shortness of breath, wheezing  Cardiovascular: denies chest pain, palpitations  Gastrointestinal: denies abdominal pain, N/V, or diarrhea Musculoskeletal: denies any other joint pains or cramps  Skin: Denies any other rashes or skin discolorations  Neurological: denies any other headache, dizziness, weakness  Psychiatric: denies any other depression, anxiety  All other review of systems: otherwise negative   Vital Signs:  BP 134/69   Pulse 79   Temp 97.9 F (36.6 C) (Skin)   Ht 5\' 11"  (1.803 m)   Wt 178 lb (80.7 kg)   SpO2 98%   BMI 24.83 kg/m    Physical Exam:  Constitutional:  -- Normal body habitus  -- Awake, alert, and oriented x3  Eyes:  -- Pupils equally round and reactive to light  -- No scleral icterus  Ear, nose, throat:  -- No jugular venous distension  -- No nasal drainage, bleeding Pulmonary:  -- No crackles -- Equal breath sounds bilaterally -- Breathing non-labored at rest Cardiovascular:  -- S1, S2 present  -- No pericardial rubs  Gastrointestinal:  -- Soft, nontender, non-distended, no guarding/rebound  -- No abdominal masses appreciated, pulsatile or otherwise  Musculoskeletal / Integumentary:  -- Wounds or skin discoloration: essentially unchanged/minimally changed 1 - 1.5  cm open Left axillary wound overlying a rather large underlying cavity with significant amounts of necrotic tissue vs tumor expressed and removed with insertion (using cotton swab) and removal of dry gauze, no fluctuance or erythema associated with likewise unchanged minimally tender to palpation Left axillary masses  -- Extremities: B/L UE and LE FROM, hands and feet warm, no edema except LUE lymphedema with LUE compression stocking in place Neurologic:  -- Motor function: intact and symmetric  -- Sensation: intact and symmetric   Imaging: No new pertinent imaging studies available for review   Assessment:  67 y.o. yo Male with a problem list including...  Patient Active Problem List   Diagnosis Date Noted  . Anemia 05/10/2018  . Ileus ([place])   . Constipation 10/10/2017  . Metastatic melanoma ([place]) 07/07/2017  . Axillary mass, left 06/26/2017  . Chronic pain of left knee 12/14/2015  . Influenza vaccination declined 11/27/2015    presents to clinic for follow-up evaluation of open Left axillary s/p open Left axillary lymph node biopsy for metastatic melanoma, having decreased substantially in size with ongoing oral chemotherapy and drainage more consistent with tissue/tumor necrosis than abscess.  Plan:              - chronic antibiotics as prescribed             - continue packing with dry gauze at least twice daily + prn             - in the absence of any evident infection/abscess, okay to continue wound care as established with wound center and oncology             -  return to clinic as needed, instructed to call if any questions or concerns  All of the above recommendations were discussed with the patient, and all of patient's questions were answered to his expressed satisfaction.  Thank you again for the opportunity to participate with this patient's care.  -- Marilynne Drivers Rosana Hoes, MD, [facility]: [facility] - Partnering for exceptional  care. Office: [phone]

## 2018-09-10 NOTE — Progress Notes (Signed)
HATEM, CULL ([id number]) Visit Report for 09/06/2018 Chief Complaint Document Details Patient Name: Louis Davies, Louis Davies Date of Service: 09/06/2018 3:30 PM Medical Record Number: [id number] Patient Account Number: [id number] Date of Birth/Sex: 1951-09-19 (67 y.o. M) Treating RN: Cornell Barman Primary Care Provider: Alvera Singh Other Clinician: Referring Provider: Alvera Singh Treating Provider/Extender: Melburn Hake, HOYT Weeks in Treatment: 11 Information Obtained from: Patient Chief Complaint Left axillary metastatic ulcer Electronic Signature(s) Signed: 09/07/2018 5:24:21 PM By: Worthy Keeler PA-C Entered By: Worthy Keeler on 09/06/2018 15:58:28 Louis Davies ([id number]) -------------------------------------------------------------------------------- HPI Details Patient Name: Louis Davies Date of Service: 09/06/2018 3:30 PM Medical Record Number: [id number] Patient Account Number: [id number] Date of Birth/Sex: 08-Jul-1951 (67 y.o. 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 The patient following excision did undergo radiation therapy which he has completed at this point. He is still on oral chemotherapy at this time however. Patient states that he does have some discomfort at the site but one of the biggest issues is actually the malodorous smell that is emanating from the wound that he states make some very self-conscious. He also did have an infection recently that he just completed oral antibiotic therapy for he was unsure of the antibiotic that he was on. He  states that just ended yesterday. Nonetheless No fevers, chills, nausea, or vomiting noted at this time. 06/22/18 on evaluation today patient's ulcer in the left axillary region actually has a fairly strong odor even I feel more so than last week at this point. He has been tolerating the dressing changes without complication. With that being said I think we may want to culture this to ensure there is no infection I know he's recently been on antibiotics before I first saw him but nonetheless I feel like this is going to be likely necessary in order to try to help control some of the older especially if it's coming from an infection 06/28/18 on evaluation today patient appears to be doing a little better in my pinion in regard to the drainage from the left axillary ulcer. He's been tolerating the dressing changes without complication. With that being said I do have his culture for review today which shows that it was positive for Enterococcus faecalis and is sensitive to ampicillin. With that being said he seems to be tolerating the dressing changes very well and in general I am pleased with what I'm seeing. I'm unsure of how much of the cancer may be remaining the patient also states that he will be having a pet scan at the end of this month on the 30th he believes. 07/12/18 on evaluation today patient actually appears to be doing a little better in regard to his ulcer and the left axillary region. He's been tolerating the dressing changes without complication the odor is better I do believe that the antibiotic has been of some benefit in this regard. Fortunately there's no evidence of overt infection at this time. 08/09/18 on evaluation today patient actually appears to be doing a little better in regard to the ulcer in the left axillary region. At this point I did review his oncology notes and he has completed radiation  therapy and apparently this cancerous lesion seems to be doing decently well.  Obviously that is worse on the necrotic tissue and odor may be coming from as far as the chemotherapy the amount of tissue that has been destroyed is concerned inside the wound itself. Fortunately there does not appear to be evidence of infection and the owner itself is not as significant as was previous. 08/23/18 on evaluation today patient actually appears to be doing more poorly in regard to his left axillary ulcer. He actually has an area of swelling/induration which exhibited signs of fluctuance which was posterior/lateral to the open wound in the left axillary area. This is been more tender. The patient tells me that he has been on antibiotics and still is taking them for a staff infection. Nonetheless it feels like you may be an abscess region underneath this area. No fevers, chills, nausea, or vomiting noted at this time. 09/06/18 On evaluation today patient unfortunately appears to be doing significantly worse in regard to his left axillary ulcer. The area which last time I saw him showed evidence of what appeared to be most likely to abscess was subsequently evaluated by surgery and by that time it appeared to be more of the necrotic mass. Nonetheless upon inspection today this seems to be significantly worse this area tissue is definitely necrotic and much larger than what appeared to be the case when I saw him at my last visit. Nonetheless he does have evidence of continued significant drainage unfortunately. Overall the odor associated with the drainage particular is significantly worse as well. I'm concerned about this being more of a enlarging and worsening cancerous lesion based on the overall appearance and odor to be honest. This was discussed with the patient he tells me however in regard to the cancer in this axillary region. He has not seen Dr. Grayland Ormond recently and his next appointment is at the beginning of December although I think he may need to see him sooner. I'm still  somewhat confused about whether or not this patient is under true palliative care or if he is still within the realm of a curative process.Louis Davies, Louis Davies ([id number]) Electronic Signature(s) Signed: 09/07/2018 5:24:21 PM By: Worthy Keeler PA-C Entered By: Worthy Keeler on 09/07/2018 17:20:20 Louis Davies, Louis Davies ([id number]) -------------------------------------------------------------------------------- Physical Exam Details Patient Name: Louis Davies Date of Service: 09/06/2018 3:30 PM Medical Record Number: [id number] Patient Account Number: [id number] Date of Birth/Sex: Nov 07, 1950 (67 y.o. M) Treating RN: Cornell Barman Primary Care Provider: Alvera Singh Other Clinician: Referring Provider: Alvera Singh Treating Provider/Extender: STONE III, HOYT Weeks in Treatment: 74 Constitutional Well-nourished and well-hydrated in no acute distress. Respiratory normal breathing without difficulty. clear to auscultation bilaterally. Cardiovascular regular rate and rhythm with normal S1, S2. Psychiatric this patient is able to make decisions and demonstrates good insight into disease process. Alert and Oriented x 3. pleasant and cooperative. Notes Patient's wound bed currently shows evidence of depth which continues to be quite significant in regard to the left actually region. Again the odor associated with this one is dramatically worsens back to at least where it was when I first saw him there had been some improvement in the interim since we had been caring for the wound. This is definitely a shift in the appearance. As far as the area just lateral to the wound opening we been taking care of there appears to be what I would assume is a fungating cancerous mass at the site. Nonetheless I'm not  sure if this is just area of necrotic tissue secondary to his radiation treatments but again I feel like it is somewhat removed from those treatments to the point that I would not  expect this to just show up. Electronic Signature(s) Signed: 09/07/2018 5:24:21 PM By: Worthy Keeler PA-C Entered By: Worthy Keeler on 09/07/2018 17:23:01 Louis Davies ([id number]) -------------------------------------------------------------------------------- Physician Orders Details Patient Name: Louis Davies Date of Service: 09/06/2018 3:30 PM Medical Record Number: [id number] Patient Account Number: [id number] Date of Birth/Sex: [birth year] (67 y.o. 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 o May Shower, gently pat wound dry prior to applying new dressing. - wash with hibiclens Anesthetic (add to Medication List) Wound #1 Left Axilla o Topical Lidocaine 4% cream applied to wound bed prior to debridement (In Clinic Only). Primary Wound Dressing Wound #1 Left Axilla o Other: - carboflex Secondary Dressing Wound #1 Left Axilla o Boardered Foam Dressing Dressing Change Frequency Wound #1 Left Axilla o Other: - Multiple times daily due to excessive drainage. Follow-up Appointments Wound #1 Left Axilla o Return Appointment in 2 weeks. Additional Orders / Instructions Wound #1 Left Axilla o Increase protein intake. Electronic Signature(s) Signed: 09/07/2018 10:18:29 AM By: Gretta Cool, BSN, RN, CWS, Kim RN, BSN Signed: 09/07/2018 5:24:21 PM By: Worthy Keeler PA-C Entered By: Gretta Cool BSN, RN, CWS, Kim on 09/06/2018 16:14:50 Louis Davies, Louis Davies ([id number]) Louis Davies, Louis Davies ([id number]) -------------------------------------------------------------------------------- Problem List Details Patient Name: Louis Davies Date of Service: 09/06/2018 3:30 PM Medical Record Number: [id number] Patient Account Number: [id number] Date of Birth/Sex: 13-Feb-1951 (67 y.o. 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 The patient following excision did undergo radiation therapy which he has completed at this point. He  is still on oral chemotherapy at this time however. Patient states that he does have some discomfort at the site but one of the biggest issues is actually the malodorous smell that is emanating from the wound that he states  make some very self-conscious. He also did have an infection recently that he just completed oral antibiotic therapy for he was unsure of the antibiotic that he was on. He states that just ended yesterday. Nonetheless No fevers, chills, nausea, or vomiting noted at this time. 06/22/18 on evaluation today patient's ulcer in the left axillary region actually has a fairly strong odor even I feel more so than last week at this point. He has been tolerating the dressing changes without complication. With that being said I think we may want to culture this to ensure there is no infection I know he's recently been on antibiotics before I first saw him but nonetheless I feel like this is going to be likely necessary in order to try to help control some of the older especially if it's coming from an infection 06/28/18 on evaluation today patient appears to be doing a little better in my pinion in regard to the drainage from the left axillary ulcer. He's been tolerating the dressing changes without complication. With that being said I do have his culture for review today which shows that it was positive for Enterococcus faecalis and is sensitive to ampicillin. With that being said he seems to be tolerating the dressing changes very well and in general I am pleased with what I'm seeing. I'm unsure of how much of the cancer may be remaining the patient also states that he will be having a pet scan at the end of this month on the 30th he believes. 07/12/18 on evaluation today patient actually appears to be doing a little better in regard to his ulcer and the left axillary region. He's been tolerating the dressing changes without complication the odor is better I do believe that the antibiotic has  been of some benefit in this regard. Fortunately there's no evidence of overt infection at this time. 08/09/18 on evaluation today patient actually appears to be doing a little better in regard to the ulcer in the left axillary region. At this point I did review his oncology notes and he has completed radiation therapy and apparently this cancerous lesion seems to be doing decently well. Obviously that is worse on the necrotic tissue and odor may be coming from as far as the chemotherapy the amount of tissue that has been destroyed is concerned inside the wound itself. Fortunately there does not appear to be evidence of infection and the owner itself is not as significant as was previous. 08/23/18 on evaluation today patient actually appears to be doing more poorly in regard to his left axillary ulcer. He actually has an area of swelling/induration which exhibited signs of fluctuance which was posterior/lateral to the open wound in the left axillary area. This is been more tender. The patient tells me that he has been on antibiotics and still is taking them for a staff infection. Nonetheless it feels like you may be an abscess region underneath this area. No fevers, chills, nausea, or vomiting noted at this time. 09/06/18 On evaluation today patient unfortunately appears to be doing significantly worse in regard to his left axillary ulcer. The area which last time I saw him showed evidence of what appeared to be most likely to abscess was subsequently evaluated by surgery and by that time it appeared to be more of the necrotic mass. Nonetheless upon inspection today this seems to be significantly worse this area tissue is definitely necrotic and much larger than what appeared to be the case Louis Davies, Louis Davies. ([id number]) when  I saw him at my last visit. Nonetheless he does have evidence of continued significant drainage unfortunately. Overall the odor associated with the drainage particular is  significantly worse as well. I'm concerned about this being more of a enlarging and worsening cancerous lesion based on the overall appearance and odor to be honest. This was discussed with the patient he tells me however in regard to the cancer in this axillary region. He has not seen Dr. Grayland Ormond recently and his next appointment is at the beginning of December although I think he may need to see him sooner. I'm still somewhat confused about whether or not this patient is under true palliative care or if he is still within the realm of a curative process.. Patient History Information obtained from Patient. Family History Cancer - Father, Diabetes - Mother, Hypertension - Mother, No family history of Heart Disease, Kidney Disease, Lung Disease, Stroke, Thyroid Problems, Tuberculosis. Social History Never smoker, Marital Status - Divorced, Alcohol Use - Never, Drug Use - No History, Caffeine Use - Rarely. Medical And Surgical History Notes Constitutional Symptoms (General Health) Radiation and Chemo for Metastatic Melanoma Review of Systems (ROS) Constitutional Symptoms (General Health) Denies complaints or symptoms of Fever, Chills. Respiratory The patient has no complaints or symptoms. Cardiovascular The patient has no complaints or symptoms. Psychiatric The patient has no complaints or symptoms. Objective Constitutional Well-nourished and well-hydrated in no acute distress. Vitals Time Taken: 3:39 PM, Height: 71 in, Weight: 185.6 lbs, BMI: 25.9, Temperature: 98.1 F, Pulse: 75 bpm, Respiratory Rate: 16 breaths/min, Blood Pressure: 126/65 mmHg. Respiratory normal breathing without difficulty. clear to auscultation bilaterally. Cardiovascular regular rate and rhythm with normal S1, S2. Psychiatric this patient is able to make decisions and demonstrates good insight into disease process. Alert and Oriented x 3. pleasant and cooperative. Louis Davies, Louis Davies ([id number]) General  Notes: Patient's wound bed currently shows evidence of depth which continues to be quite significant in regard to the left actually region. Again the odor associated with this one is dramatically worsens back to at least where it was when I first saw him there had been some improvement in the interim since we had been caring for the wound. This is definitely a shift in the appearance. As far as the area just lateral to the wound opening we been taking care of there appears to be what I would assume is a fungating cancerous mass at the site. Nonetheless I'm not sure if this is just area of necrotic tissue secondary to his radiation treatments but again I feel like it is somewhat removed from those treatments to the point that I would not expect this to just show up. Integumentary (Hair, Skin) Wound #1 status is Open. Original cause of wound was Gradually Appeared. The wound is located on the Left Axilla. The wound measures 3.2cm length x 3.5cm width x 4.5cm depth; 8.796cm^2 area and 39.584cm^3 volume. There is Fat Layer (Subcutaneous Tissue) Exposed exposed. There is no tunneling or undermining noted. There is a large amount of serosanguineous drainage noted. Foul odor after cleansing was noted. The wound margin is flat and intact. There is no granulation within the wound bed. There is no necrotic tissue within the wound bed. The periwound skin appearance exhibited: Rash, Scarring, Maceration. The periwound skin appearance did not exhibit: Callus, Crepitus, Excoriation, Induration, Atrophie Blanche, Cyanosis, Ecchymosis, Hemosiderin Staining, Mottled, Pallor, Rubor, Erythema. 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 May Shower, gently pat wound  dry prior to applying new dressing. - wash with hibiclens Anesthetic (add to Medication List): Wound #1 Left Axilla: Topical Lidocaine 4% cream applied to wound bed prior to debridement (In Clinic Only). Primary Wound Dressing: Wound #1 Left Axilla: Other: - carboflex Secondary Dressing: Wound #1 Left Axilla: Boardered Foam Dressing Dressing Change Frequency: Wound #1 Left Axilla: Other: - Multiple times daily due to excessive drainage. Follow-up Appointments: Wound #1 Left Axilla: Return Appointment in 2 weeks. Additional Orders / Instructions: Wound #1 Left Axilla: Increase protein intake. Louis Davies, Louis Davies ([id number]) I did send a message to epic to the patient's palliative care nurse practitioner who in turn did write me back to let me know that they have been in touch with Dr. Grayland Ormond and their [facility] bring the patient in to see whether or not they need to perform a biopsy of this new site. Again from a surgical perspective there does not appear to be anything that surgery has recommended at this time. We will subsequently see were things stand at follow-up. Please see above for specific wound care orders. We will see patient for re-evaluation in 2 week(s) here in the clinic. If anything worsens or changes patient will contact our office for additional recommendations. Electronic Signature(s) Signed: 09/07/2018 5:24:21 PM By: Worthy Keeler PA-C Entered By: Worthy Keeler on 09/07/2018 17:23:19 Louis Davies ([id number]) -------------------------------------------------------------------------------- ROS/PFSH Details Patient Name: Louis Davies Date of Service: 09/06/2018 3:30 PM Medical Record Number: [id number] Patient Account Number: [id number] Date of Birth/Sex: 1951/02/16 (67 y.o. M) Treating RN: Cornell Barman Primary Care Provider: Alvera Singh Other Clinician: Referring Provider: Alvera Singh Treating Provider/Extender: Melburn Hake, HOYT Weeks in  Treatment: 11 Information Obtained From Patient Wound History Do you currently have one or more open woundso Yes How many open wounds do you currently haveo 1 Approximately how long have you had your woundso 2 months How have you been treating your wound(s) until nowo bandage Has your wound(s) ever healed and then re-openedo No Have you had any lab work done in the past montho Yes Have you tested positive for an antibiotic resistant organism (MRSA, VRE)o No Have you tested positive for osteomyelitis (bone infection)o No Have you had any tests for circulation on your legso No Have you had other problems associated with your woundso Infection, Swelling Constitutional Symptoms (General Health) Complaints and Symptoms: Negative for: Fever; Chills Medical History: Past Medical History Notes: Radiation and Chemo for Metastatic Melanoma Eyes Medical History: Negative for: Cataracts; Glaucoma; Optic Neuritis Ear/Nose/Mouth/Throat Medical History: Negative for: Chronic sinus problems/congestion; Middle ear problems Hematologic/Lymphatic Medical History: Negative for: Anemia; Hemophilia; Human Immunodeficiency Virus; Lymphedema; Sickle Cell Disease Respiratory Complaints and Symptoms: No Complaints or Symptoms Medical History: Negative for: Aspiration; Asthma; Chronic Obstructive Pulmonary Disease (COPD); Pneumothorax; Sleep Apnea; Tuberculosis Cardiovascular Louis Davies, Louis Davies ([id number]) Complaints and Symptoms: No Complaints or Symptoms Medical History: Negative for: Angina; Arrhythmia; Congestive Heart Failure; Coronary Artery Disease; Deep Vein Thrombosis; Hypertension; Hypotension; Myocardial Infarction; Peripheral Arterial Disease; Peripheral Venous Disease; Phlebitis; Vasculitis Gastrointestinal Medical History: Negative for: Cirrhosis ; Colitis; Crohnos; Hepatitis A; Hepatitis B; Hepatitis C Endocrine Medical History: Negative for: Type I Diabetes; Type II  Diabetes Genitourinary Medical History: Negative for: End Stage Renal Disease Immunological Medical History: Negative for: Lupus Erythematosus; Raynaudos; Scleroderma Integumentary (Skin) Medical History: Negative for: History of Burn;  History of pressure wounds Musculoskeletal Medical History: Positive for: Osteoarthritis - left knee Negative for: Gout; Rheumatoid Arthritis; Osteomyelitis Neurologic Medical History: Negative for: Dementia; Neuropathy; Quadriplegia; Paraplegia; Seizure Disorder Oncologic Medical History: Positive for: Received Chemotherapy; Received Radiation - Axillary Psychiatric Complaints and Symptoms: No Complaints or Symptoms Medical History: Negative for: Anorexia/bulimia; Confinement Anxiety Immunizations Pneumococcal VaccineDAELEN, Louis Davies ([id number]) Received Pneumococcal Vaccination: Yes Implantable Devices Family and Social History Cancer: Yes - Father; Diabetes: Yes - Mother; Heart Disease: No; Hypertension: Yes - Mother; Kidney Disease: No; Lung Disease: No; Stroke: No; Thyroid Problems: No; Tuberculosis: No; Never smoker; Marital Status - Divorced; Alcohol Use: Never; Drug Use: No History; Caffeine Use: Rarely; Living Will: Yes (Copy provided) Physician Affirmation I have reviewed and agree with the above information. Electronic Signature(s) Signed: 09/07/2018 5:24:21 PM By: Worthy Keeler PA-C Signed: 09/08/2018 5:21:06 PM By: Gretta Cool, BSN, RN, CWS, Kim RN, BSN Entered By: Worthy Keeler on 09/07/2018 17:22:07 Louis Davies, Louis Davies ([id number]) -------------------------------------------------------------------------------- SuperBill Details Patient Name: Louis Davies Date of Service: 09/06/2018 Medical Record Number: [id number] Patient Account Number: [id number] Date of Birth/Sex: December 13, 1950 (67 y.o. 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 Facility Procedures CPT4 Code: 08657846 Description: 96295 - WOUND CARE VISIT-LEV 2 EST PT Modifier: Quantity: 1 Physician Procedures CPT4: Description Modifier Quantity Code 2841324 99214 - WC PHYS LEVEL 4 - EST PT 1 ICD-10 Diagnosis Description 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

## 2018-09-13 ENCOUNTER — Ambulatory Visit: Payer: Medicare Other | Admitting: Occupational Therapy

## 2018-09-13 DIAGNOSIS — E8989 Other postprocedural endocrine and metabolic complications and disorders: Secondary | ICD-10-CM | POA: Diagnosis not present

## 2018-09-13 DIAGNOSIS — I890 Lymphedema, not elsewhere classified: Secondary | ICD-10-CM | POA: Diagnosis not present

## 2018-09-13 MED FILL — ZELBORAF 240 MG TABS: 240 | 30 days supply | Qty: 180 | Fill #1

## 2018-09-14 NOTE — Therapy (Signed)
[place] MAIN [facility] SERVICES [address] [place], [place], [address] Phone: [phone]   Fax:  [phone]  Occupational Therapy Treatment  Patient Details  Name: Louis Davies MRN: [id number] Date of Birth: May 15, 1951 No data recorded  Encounter Date: 09/13/2018  OT End of Session - 09/13/18 1645    Visit Number  23    Number of Visits  36    Date for OT Re-Evaluation  09/27/18    OT Start Time  0315    OT Stop Time  0400    OT Time Calculation (min)  45 min    Activity Tolerance  Patient tolerated treatment well;Patient limited by pain;Treatment limited secondary to medical complications (Comment)    Behavior During Therapy  [facility] for tasks assessed/performed       Past Medical History:  Diagnosis Date  . Anemia   . Arthritis   . Cancer ([place])   . Complication of anesthesia   . Perforated viscus   . PONV (postoperative nausea and vomiting)     Past Surgical History:  Procedure Laterality Date  . AXILLARY LYMPH NODE DISSECTION Left 06/26/2017   Procedure: EXCISION OF LEFT AXILLARY MASS;  Surgeon: Johnathan Hausen, MD;  Location: [facility] ORS;  Service: General;  Laterality: Left;  . DIAGNOSTIC LAPAROSCOPY    . EMBOLIZATION Left 05/11/2018   Procedure: EMBOLIZATION;  Surgeon: Katha Cabal, MD;  Location: [place] CV LAB;  Service: Cardiovascular;  Laterality: Left;  . LAPAROTOMY N/A 10/14/2017   Procedure: EXPLORATORY LAPAROTOMY;  Surgeon: Clayburn Pert, MD;  Location: [facility] ORS;  Service: General;  Laterality: N/A;  . VASECTOMY      There were no vitals filed for this visit.  Subjective Assessment - 09/13/18 1642    Subjective   Pt presents for OT visit 23/36 for Complete Decongestive Therapy (CDT) for melanoma-related  LUE/LUq lymphedema (LE). Pt reports he missed last visit due to 24 hour "bug"  .    Currently in Pain?  Yes   Pt did not rate numerically   Pain Location  Axilla    Pain Descriptors / Indicators   Heaviness;Discomfort;Sore;Tightness;Nagging;Pressure;Guarding;Tiring    Pain Type  Chronic pain    Pain Onset  Other (comment)   post surgical   Pain Frequency  Intermittent                   OT Treatments/Exercises (OP) - 09/14/18 0001      ADLs   ADL Education Given  Yes      Manual Therapy   Manual Therapy  Edema management;Soft tissue mobilization;Myofascial release;Manual Lymphatic Drainage (MLD)    Manual therapy comments  very gentle myofacial release to lymphatic cording visible and palpable in antecubital fossa on affected L arm. Minimal strokes in L axilla area due to pain. Drainage from axillary wound more malodorous than typical today and more plentiful. Arm swelling well controlled. L axilla and adjacent chest wall mildly swollen. Skin temp is cool to palpation and no erythema observed in undressed areas adjacent to wound.     Manual Lymphatic Drainage (MLD)  MLD in combination with myofacial release to LUE,/ LUQ as established    Compression Bandaging  no sleeve after session as Pt is on his way to wound clinic apt. Pt agrees to don garment when he arrives home this afternoon.             OT Education - 09/13/18 1644    Education Details  Continued skilled  Pt/caregiver education  And LE ADL training throughout visit for lymphedema self care/ home program, including compression wrapping, compression garment and device wear/care, lymphatic pumping ther ex, simple self-MLD, and skin care. Discussed progress towards goals.     Person(s) Educated  Patient    Methods  Explanation;Demonstration;Handout;Tactile cues;Verbal cues    Comprehension  Verbalized understanding;Need further instruction;Returned demonstration;Verbal cues required;Tactile cues required          OT Long Term Goals - 09/02/18 1623      OT LONG TERM GOAL #1   Title  Pt will be able to apply multi-layer short stretch compression wraps using correct gradient techniques with independently ,  but with extra time  to achieve optimal limb volume reduction during Intensive Phase CDT, and to return affected limb(s) , as closely as possible, to premorbid size and shape.    Baseline  Dependent    Time  2    Period  Weeks    Status  Achieved      OT LONG TERM GOAL #2   Title  Pt will demonstrate understanding of lymphedema (LE) precautions / prevention principals, including signs / symptoms of cellulitis infection with modified independence using LE Workbook as printed reference to identify 6 precautions without verbal cues by end of 3rd  OT Rx visit.     Baseline  no knowledge    Time  3    Period  Days    Status  Achieved      OT LONG TERM GOAL #3   Title  Pt to achieve no less than 10% limb volume reduction in affected limb(s) during Intensive Phase CDT to control limb swelling, to improve tissue integrity and immune function, to improve ADLs performance and to improve functional mobility/ transfers.    Baseline  dependent    Time  12    Period  Weeks    Status  Partially Met      OT LONG TERM GOAL #4   Title  Pt will achieve 100% compliance with daily LE self-care home program components, including proper skin care, simple self-MLD, gradient compression wraps/ garments, and therapeutic exercise to ensure optimal Intensive Phase limb volume reduction to expedite compression garment/ device fitting.    Baseline  Max A    Time  12    Period  Weeks    Status  Partially Met      OT LONG TERM GOAL #5   Title  Pt will demonstrate competent use of assistive devices during LE self-care training to improve ability to don and doff compression garments/devices with modified assistance to ensure optimal LE self-management over time.    Baseline  Max A    Time  12    Status  Achieved      OT LONG TERM GOAL #6   Title  Pt will retain optimal limb volume reductions achieved during Intensive Phase CDT with no more than 3% volume increase  without CG assistance to limit LE progression and  further functional decline.    Baseline  Dependent    Time  6    Period  Months    Status  On-going            Plan - 09/14/18 1646    Clinical Impression Statement  Pt tolerated MLD and myofacial release in supine and sidelying. Very gentle manual therapy at antecubital fossa in effort to release cording so far has not been successful. Strong oder noted from axillary wound today. Pt edu  for donning sleeve using ripstop donning aide after manual therapies. Pt able to use device with min assist after edu. Cont as per OC.    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    OT Frequency  2x / week    OT Duration  Other (comment)   and PRN   OT Treatment/Interventions  Self-care/ADL training;Therapeutic exercise;Manual lymph drainage;Compression bandaging;Patient/family education;Other (comment);Energy conservation;Therapeutic activities;Balance training;DME and/or AE instruction;Manual Therapy;Passive range of motion    Clinical Decision Making  Multiple treatment options, significant modification of task necessary    Recommended Other Services  fit with custom, clat knit LUE compression sleeve and HOS device. Consider seamless compression glove and vest. Consider kinesiotape during Intensive Phase    Consulted and Agree with Plan of Care  Patient       Patient will benefit from skilled therapeutic intervention in order to improve the following deficits and impairments:  Decreased endurance, Decreased  mobility, Decreased skin integrity, Impaired sensation, Decreased knowledge of precautions, Decreased range of motion, Decreased scar mobility, Increased edema, Pain, Decreased activity tolerance, Decreased knowledge of use of DME, Decreased strength, Impaired flexibility, Impaired UE functional use, Decreased psychosocial skills, Decreased balance, Other (comment)(balance and gait negatively impacted by guarding  posture , impaired armswing and body asymmetry)  Visit Diagnosis: Lymphedema, not elsewhere classified    Problem List Patient Active Problem List   Diagnosis Date Noted  . Anemia 05/10/2018  . Ileus ([place])   . Constipation 10/10/2017  . 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 Axillary mass, left 06/26/2017  . Chronic pain of left knee 12/14/2015  . Influenza vaccination declined 11/27/2015    Andrey Spearman, MS, OTR/L, [facility] 09/14/18 4:49 PM  [place] MAIN [facility] SERVICES [address] [place], [place], [address] Phone: [phone]   Fax:  [phone]  Name: KAZDEN LARGO MRN: [id number] Date of Birth: [birth year]

## 2018-09-19 NOTE — Progress Notes (Signed)
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  INTERVAL HISTORY: Patient returns to clinic today as an add-on for further evaluation and to assess the large necrotic mass in his left axilla.  He has noted increased weakness and fatigue.  He is otherwise tolerating 720 mg Zelboraf twice daily well.  He continues to have pain.  He has no neurologic complaints. He denies any recent fevers or illnesses. He has no chest pain, cough, or hemoptysis. He denies any nausea, vomiting, constipation, or diarrhea. He has no melena or hematochezia. He has no urinary complaints.  Patient offers no further specific complaints today.  REVIEW OF SYSTEMS:   Review of Systems  Constitutional: Positive for malaise/fatigue. Negative for fever and weight loss.  Respiratory: Negative.  Negative for cough and shortness of breath.   Cardiovascular: Negative.  Negative for chest pain and leg swelling.  Gastrointestinal: Negative.  Negative for abdominal pain, blood in stool, melena, nausea and vomiting.  Genitourinary: Negative.  Negative for dysuria.  Musculoskeletal: Negative.  Negative for myalgias.  Skin: Negative.  Negative for rash.  Neurological: Positive for weakness. Negative for sensory change and focal weakness.  Psychiatric/Behavioral: Negative.  The patient is not nervous/anxious.     As per HPI. Otherwise, a complete review of systems is negative.  PAST MEDICAL HISTORY: Past Medical History:  Diagnosis Date  . Anemia   . Arthritis   . Cancer ([place])   . Complication of anesthesia   . Perforated viscus   . PONV (postoperative nausea and vomiting)     PAST SURGICAL HISTORY: Past Surgical History:  Procedure Laterality Date  . AXILLARY LYMPH  NODE DISSECTION Left 06/26/2017   Procedure: EXCISION OF LEFT AXILLARY MASS;  Surgeon: Johnathan Hausen, MD;  Location: [facility] ORS;  Service: General;  Laterality: Left;  . DIAGNOSTIC LAPAROSCOPY    . EMBOLIZATION Left 05/11/2018   Procedure: EMBOLIZATION;  Surgeon: Katha Cabal, MD;  Location: [place] CV LAB;  Service: Cardiovascular;  Laterality: Left;  . LAPAROTOMY N/A 10/14/2017   Procedure: EXPLORATORY LAPAROTOMY;  Surgeon: Clayburn Pert, MD;  Location: [facility] ORS;  Service: General;  Laterality: N/A;  . VASECTOMY      FAMILY HISTORY: Family History  Problem Relation Age of Onset  . Diabetes Mother   . Hypertension Mother   . Leukemia Father   . Thyroid disease Sister   . Liver disease Maternal Grandmother   . Heart Problems Maternal Grandfather   . Diabetes Maternal Grandfather   . Lung cancer Paternal Grandmother   . Prostate cancer Paternal Grandfather     ADVANCED DIRECTIVES (Y/N):  N  HEALTH MAINTENANCE: Social History   Tobacco Use  . Smoking status: Never Smoker  . Smokeless tobacco: Never Used  Substance Use Topics  . Alcohol use: No  . Drug use: No     Colonoscopy:  PAP:  Bone density:  Lipid panel:  No Known Allergies  Current Outpatient Medications  Medication Sig Dispense Refill  . Multiple Vitamin (MULTIVITAMIN) tablet Take 1 tablet by mouth daily.    . Oxycodone HCl 10 MG TABS Take 1 tablet (10 mg total) by mouth every 6 (six) hours as needed. (Patient taking differently: Take 10 mg by mouth every 12 (twelve) hours as needed (for severe pain). ) 60 tablet  0  . ZELBORAF 240 MG tablet TAKE 3 TABLETS (720 MG TOTAL) BY MOUTH EVERY 12 HOURS. (Patient taking differently: Take 720 mg by mouth every 12 (twelve) hours. ) 180 tablet 2   No current facility-administered medications for this visit.     OBJECTIVE: Vitals:   09/22/18 1213  BP: 103/65  Pulse: 76  Resp: 20  Temp: 98.4 F (36.9 C)     Body mass index is 23.15 kg/m.    ECOG FS:2 -  Symptomatic, <50% confined to bed  General: Thin, no acute distress. Eyes: Pink conjunctiva, anicteric sclera. HEENT: Normocephalic, moist mucous membranes, clear oropharnyx. Lungs: Clear to auscultation bilaterally. Heart: Regular rate and rhythm. No rubs, murmurs, or gallops. Abdomen: Soft, nontender, nondistended. No organomegaly noted, normoactive bowel sounds. Musculoskeletal: No edema, cyanosis, or clubbing. Neuro: Alert, answering all questions appropriately. Cranial nerves grossly intact. Skin: No rashes or petechiae noted. Psych: Normal affect. Lymphatics: Large necrotic mass noted in left axilla.  LAB RESULTS:  Lab Results  Component Value Date   NA 133 (L) 09/22/2018   K 4.0 09/22/2018   CL 97 (L) 09/22/2018   CO2 26 09/22/2018   GLUCOSE 101 (H) 09/22/2018   BUN 11 09/22/2018   CREATININE 1.10 09/22/2018   CALCIUM 8.3 (L) 09/22/2018   PROT 7.0 09/22/2018   ALBUMIN 3.2 (L) 09/22/2018   AST 11 (L) 09/22/2018   ALT 9 09/22/2018   ALKPHOS 49 09/22/2018   BILITOT 1.0 09/22/2018   GFRNONAA >60 09/22/2018   GFRAA >60 09/22/2018    Lab Results  Component Value Date   WBC 9.3 09/22/2018   NEUTROABS 8.1 (H) 09/22/2018   HGB 7.6 (L) 09/22/2018   HCT 25.4 (L) 09/22/2018   MCV 68.6 (L) 09/22/2018   PLT 442 (H) 09/22/2018     STUDIES: No results found.  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  PLAN:    1. 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  Patient has been instructed to discontinue his Zelboraf secondary to his surgical debridement planned for this Friday.  Return to clinic in 2 weeks for further evaluation and consideration of reinitiation of treatment.  Will plan repeat imaging with PET scan in early January 2020.     2.  Left axilla: Appreciate surgical input.  Agree with surgical debridement on September 24, 2018.  Hold Zelboraf as above.   3.  Left arm swelling: Significantly improved.  Ultrasound negative for DVT.  Continue follow-up with lymphedema clinic. 4.  Poor appetite: Improved.  Continue Megace as prescribed.  Appreciate dietary input. 5.  Anemia: Patient's hemoglobin has trended down to 7.6, likely from bleeding from his necrotic mass.  Can consider 1 to 2 units of blood postoperatively on Friday. 6.  Left posterior back lesion: Resolved. 7.  Declining performance status/wound care: Patient will benefit from home health and this is been ordered.  Patient expressed understanding and was in agreement with this plan. He also understands that He can call clinic at any time with any questions, concerns, or complaints.   Cancer Staging Metastatic melanoma [facility]) Staging form: Melanoma of the Skin, AJCC 8th Edition - Clinical stage from 07/15/2017: Stage IV (cTX, cN1, cM1c(0)) - Signed by Lloyd Huger, MD on 07/15/2017   Lloyd Huger, MD   09/22/2018 3:58 PM

## 2018-09-20 ENCOUNTER — Encounter: Payer: Medicare Other | Attending: Physician Assistant | Admitting: Physician Assistant

## 2018-09-21 ENCOUNTER — Other Ambulatory Visit: Payer: Self-pay

## 2018-09-21 ENCOUNTER — Ambulatory Visit: Payer: Medicare Other | Admitting: Occupational Therapy

## 2018-09-21 ENCOUNTER — Encounter: Payer: Self-pay | Admitting: Surgery

## 2018-09-21 ENCOUNTER — Ambulatory Visit (INDEPENDENT_AMBULATORY_CARE_PROVIDER_SITE_OTHER): Payer: Medicare Other | Admitting: Surgery

## 2018-09-21 VITALS — BP 121/70 | HR 82 | Temp 97.5°F | Ht 71.0 in | Wt 164.0 lb

## 2018-09-21 DIAGNOSIS — S41102S Unspecified open wound of left upper arm, sequela: Secondary | ICD-10-CM

## 2018-09-21 NOTE — H&P (View-Only) (Signed)
Surgical Clinic Progress/Follow-up Note   HPI:  67 y.o. Male presents to clinic for follow-up evaluation of his Left axillary wound. Patient was diagnosed with metastatic melanoma following Left axillary lymph node biopsy by Dr. Kaylyn Lim ([place]), since which patient has developed a persistent draining Left axillary wound that his been managed by [place] wound clinic. Since patient's most recent surgical evaluation, patient and his wife and daughter report his foul-smelling Left axillary formerly cavitary wound became rapidly enlarged over the past 5 - 6 days, becoming a rather large foul-smelling, oozing mass, along with concominant flattening and softening of the formerly hard surrounding peri-axillary tissues. Patient otherwise denies any fever, N/V, CP, or SOB. Patient and his family also confirm that patient has an appointment tomorrow with oncologist, Dr. Grayland Ormond.  Review of Systems:  Constitutional: denies any other weight loss, fever, chills, or sweats  Eyes: denies any other vision changes, history of eye injury  ENT: denies sore throat, hearing problems  Respiratory: denies shortness of breath, wheezing  Cardiovascular: denies chest pain, palpitations  Gastrointestinal: denies abdominal pain, N/V, or diarrhea Musculoskeletal: denies any other joint pains or cramps  Skin: Denies any other rashes or skin discolorations except Left axillary wound as described by interval history Neurological: denies any other headache, dizziness, weakness  Psychiatric: denies any other depression, anxiety  All other review of systems: otherwise negative   Vital Signs:  BP 121/70   Pulse 82   Temp (!) 97.5 F (36.4 C) (Skin)   Ht 5\' 11"  (1.803 m)   Wt 164 lb (74.4 kg)   SpO2 98%   BMI 22.87 kg/m    Physical Exam:  Constitutional:  -- Normal body habitus  -- Awake, alert, and oriented x3  Eyes:  -- Pupils equally round and reactive to light  -- No scleral icterus  Ear, nose,  throat:  -- No jugular venous distension  -- No nasal drainage, bleeding Pulmonary:  -- No crackles -- Equal breath sounds bilaterally -- Breathing non-labored at rest Cardiovascular:  -- S1, S2 present  -- No pericardial rubs  Gastrointestinal:  -- Soft, nontender, non-distended, no guarding/rebound  -- No abdominal masses appreciated, pulsatile or otherwise  Musculoskeletal / Integumentary:  -- Wounds or skin discoloration: new large very friable and oozing non-tender foul-smelling necrotic Left axillary mass with comparatively soft and flat Left peri-axillary tissues, most suggestive of extravasation of formerly peri-axillary tumor necrosis  -- Extremities: B/L UE and LE FROM, hands and feet warm, no edema (though LUE compression stocking remains in place for known chronic LUE lymphedema) Neurologic:  -- Motor function: intact and symmetric  -- Sensation: intact and symmetric   Laboratory studies:  CBC Latest Ref Rng & Units 08/19/2018 07/21/2018 06/23/2018  WBC 4.0 - 10.5 K/uL 5.4 4.6 4.0  Hemoglobin 13.0 - 17.0 g/dL 9.1(L) 9.1(L) 8.5(L)  Hematocrit 39.0 - 52.0 % 31.1(L) 28.9(L) 26.7(L)  Platelets 150 - 400 K/uL 362 301 272   CMP Latest Ref Rng & Units 08/19/2018 07/21/2018 06/23/2018  Glucose 70 - 99 mg/dL 136(H) 90 102(H)  BUN 8 - 23 mg/dL 13 15 16   Creatinine 0.61 - 1.24 mg/dL 1.24 1.01 1.12  Sodium 135 - 145 mmol/L 139 140 138  Potassium 3.5 - 5.1 mmol/L 3.7 3.9 3.6  Chloride 98 - 111 mmol/L 103 107 107  CO2 22 - 32 mmol/L 26 23 23   Calcium 8.9 - 10.3 mg/dL 8.8(L) 8.7(L) 8.1(L)  Total Protein 6.5 - 8.1 g/dL 7.6 6.9 6.6  Total Bilirubin  0.3 - 1.2 mg/dL 0.8 0.7 0.7  Alkaline Phos 38 - 126 U/L 54 52 52  AST 15 - 41 U/L 14(L) 14(L) 14(L)  ALT 0 - 44 U/L 10 10 9    Imaging: No new pertinent imaging available for review since previously reviewed PET-CT (07/19/2018)  Assessment:  40 y.o. yo Male with a problem list including...  Patient Active Problem List   Diagnosis Date  Noted  . Anemia 05/10/2018  . Ileus ([place])   . Constipation 10/10/2017  . 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 Axillary mass, left 06/26/2017  . Chronic pain of left knee 12/14/2015  . Influenza vaccination declined 11/27/2015    presents to clinic for follow-up evaluation of what now appears to be extravasation of patient's very friable and oozing necrotic Left axillary tissue (most likely tumor in context of metastatic melanoma on oral chemotherapy with bulky necrotic former Left axillary tumor (per recent PET-CT) and chronic open draining and foul-smelling Left axillary wound, though cannot exclude some component of underlying hematoma as well.  Plan:   - patient advised to keep appointment with Dr. Grayland Ormond tomorrow  - anticipate continuation of oral chemotherapy, since it appears to be effective  - following tomorrow's appointment, will plan to discuss management plan with Dr. Grayland Ormond  - all risks, benefits, and alternatives to non-oncologic excisional debridement of increasingly symptomatic and foul-smelling enlarged/extravasated necrotic tissue/mass for palliation, to reduce infection risk, and facilitate wound management/healing were discussed with the patient and his family, all of their questions were answered to their expressed satisfaction, patient expresses he wishes to proceed, and informed consent was obtained.  - will plan to proceed with non-oncologic operative debridement of Left axillary necrosis tentatively this Friday, 12/6 pending anesthesia and OR availability  - contact information for regional wound care centers was provided by office to patient and his family per their request for a second opinion regarding wound management options (post-op)  - anticipate return to clinic next weeks following above procedure  - instructed to call office if any questions or concerns  All of the above recommendations were discussed with the patient and patient's family, and all of  patient's and family's questions were answered to their expressed satisfaction.  -- Marilynne Drivers Rosana Hoes, MD, [place]: [facility] - Partnering for exceptional care. Office: [phone]

## 2018-09-21 NOTE — Patient Instructions (Signed)
The patient is scheduled for surgery at [facility] with Dr Rosana Hoes on 09/24/18. He will pre admit at the hospital and we will call him with that time and date.

## 2018-09-21 NOTE — Progress Notes (Signed)
Surgical Clinic Progress/Follow-up Note   HPI:  67 y.o. Male presents to clinic for follow-up evaluation of his Left axillary wound. Patient was diagnosed with metastatic melanoma following Left axillary lymph node biopsy by Dr. Kaylyn Lim ([place]), since which patient has developed a persistent draining Left axillary wound that his been managed by [facility]. Since patient's most recent surgical evaluation, patient and his wife and daughter report his foul-smelling Left axillary formerly cavitary wound became rapidly enlarged over the past 5 - 6 days, becoming a rather large foul-smelling, oozing mass, along with concominant flattening and softening of the formerly hard surrounding peri-axillary tissues. Patient otherwise denies any fever, N/V, CP, or SOB. Patient and his family also confirm that patient has an appointment tomorrow with oncologist, Dr. Grayland Ormond.  Review of Systems:  Constitutional: denies any other weight loss, fever, chills, or sweats  Eyes: denies any other vision changes, history of eye injury  ENT: denies sore throat, hearing problems  Respiratory: denies shortness of breath, wheezing  Cardiovascular: denies chest pain, palpitations  Gastrointestinal: denies abdominal pain, N/V, or diarrhea Musculoskeletal: denies any other joint pains or cramps  Skin: Denies any other rashes or skin discolorations except Left axillary wound as described by interval history Neurological: denies any other headache, dizziness, weakness  Psychiatric: denies any other depression, anxiety  All other review of systems: otherwise negative   Vital Signs:  BP 121/70   Pulse 82   Temp (!) 97.5 F (36.4 C) (Skin)   Ht 5\' 11"  (1.803 m)   Wt 164 lb (74.4 kg)   SpO2 98%   BMI 22.87 kg/m    Physical Exam:  Constitutional:  -- Normal body habitus  -- Awake, alert, and oriented x3  Eyes:  -- Pupils equally round and reactive to light  -- No scleral icterus  Ear, nose,  throat:  -- No jugular venous distension  -- No nasal drainage, bleeding Pulmonary:  -- No crackles -- Equal breath sounds bilaterally -- Breathing non-labored at rest Cardiovascular:  -- S1, S2 present  -- No pericardial rubs  Gastrointestinal:  -- Soft, nontender, non-distended, no guarding/rebound  -- No abdominal masses appreciated, pulsatile or otherwise  Musculoskeletal / Integumentary:  -- Wounds or skin discoloration: new large very friable and oozing non-tender foul-smelling necrotic Left axillary mass with comparatively soft and flat Left peri-axillary tissues, most suggestive of extravasation of formerly peri-axillary tumor necrosis  -- Extremities: B/L UE and LE FROM, hands and feet warm, no edema (though LUE compression stocking remains in place for known chronic LUE lymphedema) Neurologic:  -- Motor function: intact and symmetric  -- Sensation: intact and symmetric   Laboratory studies:  CBC Latest Ref Rng & Units 08/19/2018 07/21/2018 06/23/2018  WBC 4.0 - 10.5 K/uL 5.4 4.6 4.0  Hemoglobin 13.0 - 17.0 g/dL 9.1(L) 9.1(L) 8.5(L)  Hematocrit 39.0 - 52.0 % 31.1(L) 28.9(L) 26.7(L)  Platelets 150 - 400 K/uL 362 301 272   CMP Latest Ref Rng & Units 08/19/2018 07/21/2018 06/23/2018  Glucose 70 - 99 mg/dL 136(H) 90 102(H)  BUN 8 - 23 mg/dL 13 15 16   Creatinine 0.61 - 1.24 mg/dL 1.24 1.01 1.12  Sodium 135 - 145 mmol/L 139 140 138  Potassium 3.5 - 5.1 mmol/L 3.7 3.9 3.6  Chloride 98 - 111 mmol/L 103 107 107  CO2 22 - 32 mmol/L 26 23 23   Calcium 8.9 - 10.3 mg/dL 8.8(L) 8.7(L) 8.1(L)  Total Protein 6.5 - 8.1 g/dL 7.6 6.9 6.6  Total Bilirubin  0.3 - 1.2 mg/dL 0.8 0.7 0.7  Alkaline Phos 38 - 126 U/L 54 52 52  AST 15 - 41 U/L 14(L) 14(L) 14(L)  ALT 0 - 44 U/L 10 10 9    Imaging: No new pertinent imaging available for review since previously reviewed PET-CT (07/19/2018)  Assessment:  20 y.o. yo Male with a problem list including...  Patient Active Problem List   Diagnosis Date  Noted  . Anemia 05/10/2018  . Ileus ([place])   . Constipation 10/10/2017  . Metastatic melanoma ([place]) 07/07/2017  . Axillary mass, left 06/26/2017  . Chronic pain of left knee 12/14/2015  . Influenza vaccination declined 11/27/2015    presents to clinic for follow-up evaluation of what now appears to be extravasation of patient's very friable and oozing necrotic Left axillary tissue (most likely tumor in context of metastatic melanoma on oral chemotherapy with bulky necrotic former Left axillary tumor (per recent PET-CT) and chronic open draining and foul-smelling Left axillary wound, though cannot exclude some component of underlying hematoma as well.  Plan:   - patient advised to keep appointment with Dr. Grayland Ormond tomorrow  - anticipate continuation of oral chemotherapy, since it appears to be effective  - following tomorrow's appointment, will plan to discuss management plan with Dr. Grayland Ormond  - all risks, benefits, and alternatives to non-oncologic excisional debridement of increasingly symptomatic and foul-smelling enlarged/extravasated necrotic tissue/mass for palliation, to reduce infection risk, and facilitate wound management/healing were discussed with the patient and his family, all of their questions were answered to their expressed satisfaction, patient expresses he wishes to proceed, and informed consent was obtained.  - will plan to proceed with non-oncologic operative debridement of Left axillary necrosis tentatively this Friday, 12/6 pending anesthesia and OR availability  - contact information for regional wound care centers was provided by office to patient and his family per their request for a second opinion regarding wound management options (post-op)  - anticipate return to clinic next weeks following above procedure  - instructed to call office if any questions or concerns  All of the above recommendations were discussed with the patient and patient's family, and all of  patient's and family's questions were answered to their expressed satisfaction.  -- Marilynne Drivers Rosana Hoes, MD, [place]: [facility] - Partnering for exceptional care. Office: [phone]

## 2018-09-22 ENCOUNTER — Inpatient Hospital Stay (HOSPITAL_BASED_OUTPATIENT_CLINIC_OR_DEPARTMENT_OTHER): Payer: Medicare Other | Admitting: Oncology

## 2018-09-22 ENCOUNTER — Inpatient Hospital Stay: Payer: Medicare Other | Attending: Oncology

## 2018-09-22 ENCOUNTER — Telehealth: Payer: Self-pay

## 2018-09-22 ENCOUNTER — Telehealth: Payer: Self-pay | Admitting: *Deleted

## 2018-09-22 ENCOUNTER — Inpatient Hospital Stay: Payer: Medicare Other | Admitting: Hospice and Palliative Medicine

## 2018-09-22 ENCOUNTER — Other Ambulatory Visit: Payer: Self-pay

## 2018-09-22 ENCOUNTER — Inpatient Hospital Stay: Payer: Medicare Other

## 2018-09-22 VITALS — BP 103/65 | HR 76 | Temp 98.4°F | Resp 20 | Wt 166.0 lb

## 2018-09-22 DIAGNOSIS — Z8042 Family history of malignant neoplasm of prostate: Secondary | ICD-10-CM

## 2018-09-22 DIAGNOSIS — Z79899 Other long term (current) drug therapy: Secondary | ICD-10-CM | POA: Diagnosis not present

## 2018-09-22 DIAGNOSIS — R531 Weakness: Secondary | ICD-10-CM | POA: Insufficient documentation

## 2018-09-22 DIAGNOSIS — C439 Malignant melanoma of skin, unspecified: Secondary | ICD-10-CM

## 2018-09-22 DIAGNOSIS — Z801 Family history of malignant neoplasm of trachea, bronchus and lung: Secondary | ICD-10-CM | POA: Insufficient documentation

## 2018-09-22 DIAGNOSIS — C7951 Secondary malignant neoplasm of bone: Secondary | ICD-10-CM | POA: Insufficient documentation

## 2018-09-22 DIAGNOSIS — D649 Anemia, unspecified: Secondary | ICD-10-CM | POA: Insufficient documentation

## 2018-09-22 DIAGNOSIS — R5383 Other fatigue: Secondary | ICD-10-CM | POA: Diagnosis not present

## 2018-09-22 DIAGNOSIS — C799 Secondary malignant neoplasm of unspecified site: Secondary | ICD-10-CM

## 2018-09-22 DIAGNOSIS — M129 Arthropathy, unspecified: Secondary | ICD-10-CM

## 2018-09-22 DIAGNOSIS — C781 Secondary malignant neoplasm of mediastinum: Secondary | ICD-10-CM | POA: Insufficient documentation

## 2018-09-22 DIAGNOSIS — Z1509 Genetic susceptibility to other malignant neoplasm: Secondary | ICD-10-CM | POA: Diagnosis not present

## 2018-09-22 DIAGNOSIS — R52 Pain, unspecified: Secondary | ICD-10-CM

## 2018-09-22 LAB — COMPREHENSIVE METABOLIC PANEL
ALBUMIN: 3.2 g/dL — AB (ref 3.5–5.0)
ALT: 9 U/L (ref 0–44)
AST: 11 U/L — AB (ref 15–41)
Alkaline Phosphatase: 49 U/L (ref 38–126)
Anion gap: 10 (ref 5–15)
BUN: 11 mg/dL (ref 8–23)
CO2: 26 mmol/L (ref 22–32)
Calcium: 8.3 mg/dL — ABNORMAL LOW (ref 8.9–10.3)
Chloride: 97 mmol/L — ABNORMAL LOW (ref 98–111)
Creatinine, Ser: 1.1 mg/dL (ref 0.61–1.24)
GFR calc Af Amer: >60 mL/min (ref >60)
GLUCOSE: 101 mg/dL — AB (ref 70–99)
POTASSIUM: 4 mmol/L (ref 3.5–5.1)
Sodium: 133 mmol/L — ABNORMAL LOW (ref 135–145)
TOTAL PROTEIN: 7 g/dL (ref 6.5–8.1)
Total Bilirubin: 1 mg/dL (ref 0.3–1.2)

## 2018-09-22 LAB — CBC WITH DIFFERENTIAL/PLATELET
ABS IMMATURE GRANULOCYTES: 0.05 10*3/uL (ref 0.00–0.07)
Basophils Absolute: 0 10*3/uL (ref 0.0–0.1)
Basophils Relative: 0 %
Eosinophils Absolute: 0 10*3/uL (ref 0.0–0.5)
Eosinophils Relative: 0 %
HCT: 25.4 % — ABNORMAL LOW (ref 39.0–52.0)
HEMOGLOBIN: 7.6 g/dL — AB (ref 13.0–17.0)
Immature Granulocytes: 1 %
LYMPHS PCT: 5 %
Lymphs Abs: 0.4 10*3/uL — ABNORMAL LOW (ref 0.7–4.0)
MCH: 20.5 pg — AB (ref 26.0–34.0)
MCHC: 29.9 g/dL — ABNORMAL LOW (ref 30.0–36.0)
MCV: 68.6 fL — AB (ref 80.0–100.0)
MONO ABS: 0.6 10*3/uL (ref 0.1–1.0)
MONOS PCT: 7 %
NEUTROS ABS: 8.1 10*3/uL — AB (ref 1.7–7.7)
Neutrophils Relative %: 87 %
Platelets: 442 10*3/uL — ABNORMAL HIGH (ref 150–400)
RBC: 3.7 MIL/uL — ABNORMAL LOW (ref 4.22–5.81)
RDW: 17.4 % — ABNORMAL HIGH (ref 11.5–15.5)
WBC: 9.3 10*3/uL (ref 4.0–10.5)
nRBC: 0 % (ref 0.0–0.2)

## 2018-09-22 MED ORDER — OXYCODONE HCL 5 MG PO TABS
10.0000 mg | ORAL_TABLET | Freq: Once | ORAL | Status: AC
  Administered 2018-09-22: 10 mg via ORAL
  Filled 2018-09-22: qty 2

## 2018-09-22 NOTE — Telephone Encounter (Signed)
Patient called back and was notified of date and time.

## 2018-09-22 NOTE — Progress Notes (Signed)
Patient here today for follow up regarding metastatic melanoma, enlarging left axilla mass. Patient has been followed by wound care, evaluated by Dr. Rosana Hoes. Dr. Rosana Hoes has planned debridement on Friday 12/6.

## 2018-09-22 NOTE — Telephone Encounter (Signed)
Patient called the office back to confirm his pre-admission appointment for tomorrow.

## 2018-09-22 NOTE — Telephone Encounter (Signed)
The patient will pre admit at the hospital in the [facility] on 09/23/18 at 12:00 pm.  Message left for the patient to call to get his arrival time and date.

## 2018-09-23 ENCOUNTER — Other Ambulatory Visit: Payer: Self-pay

## 2018-09-23 ENCOUNTER — Ambulatory Visit: Payer: Medicare Other | Admitting: Occupational Therapy

## 2018-09-23 ENCOUNTER — Encounter
Admission: RE | Admit: 2018-09-23 | Discharge: 2018-09-23 | Disposition: A | Discharge status: Home / Self Care | Payer: Medicare Other | Source: Ambulatory Visit | Attending: Surgery | Admitting: Surgery

## 2018-09-23 DIAGNOSIS — L98493 Non-pressure chronic ulcer of skin of other sites with necrosis of muscle: Secondary | ICD-10-CM | POA: Diagnosis not present

## 2018-09-23 DIAGNOSIS — C773 Secondary and unspecified malignant neoplasm of axilla and upper limb lymph nodes: Secondary | ICD-10-CM | POA: Diagnosis not present

## 2018-09-23 DIAGNOSIS — I96 Gangrene, not elsewhere classified: Secondary | ICD-10-CM | POA: Diagnosis not present

## 2018-09-23 DIAGNOSIS — M25562 Pain in left knee: Secondary | ICD-10-CM | POA: Diagnosis not present

## 2018-09-23 DIAGNOSIS — R222 Localized swelling, mass and lump, trunk: Secondary | ICD-10-CM | POA: Diagnosis not present

## 2018-09-23 DIAGNOSIS — G8929 Other chronic pain: Secondary | ICD-10-CM | POA: Diagnosis not present

## 2018-09-23 DIAGNOSIS — Z79899 Other long term (current) drug therapy: Secondary | ICD-10-CM | POA: Diagnosis not present

## 2018-09-23 DIAGNOSIS — Z01812 Encounter for preprocedural laboratory examination: Secondary | ICD-10-CM | POA: Insufficient documentation

## 2018-09-23 DIAGNOSIS — D630 Anemia in neoplastic disease: Secondary | ICD-10-CM | POA: Diagnosis not present

## 2018-09-23 DIAGNOSIS — C439 Malignant melanoma of skin, unspecified: Secondary | ICD-10-CM | POA: Diagnosis not present

## 2018-09-23 DIAGNOSIS — I959 Hypotension, unspecified: Secondary | ICD-10-CM | POA: Diagnosis not present

## 2018-09-23 DIAGNOSIS — Z79891 Long term (current) use of opiate analgesic: Secondary | ICD-10-CM | POA: Diagnosis not present

## 2018-09-23 HISTORY — DX: Dyspnea, unspecified: R06.00

## 2018-09-23 LAB — COMPREHENSIVE METABOLIC PANEL
ALK PHOS: 49 U/L (ref 38–126)
ALT: 9 U/L (ref 0–44)
AST: 15 U/L (ref 15–41)
Albumin: 3 g/dL — ABNORMAL LOW (ref 3.5–5.0)
Anion gap: 10 (ref 5–15)
BUN: 12 mg/dL (ref 8–23)
CALCIUM: 7.8 mg/dL — AB (ref 8.9–10.3)
CHLORIDE: 95 mmol/L — AB (ref 98–111)
CO2: 25 mmol/L (ref 22–32)
CREATININE: 1.07 mg/dL (ref 0.61–1.24)
GFR calc Af Amer: >60 mL/min (ref >60)
GFR calc non Af Amer: >60 mL/min (ref >60)
Glucose, Bld: 114 mg/dL — ABNORMAL HIGH (ref 70–99)
Potassium: 4 mmol/L (ref 3.5–5.1)
SODIUM: 130 mmol/L — AB (ref 135–145)
Total Bilirubin: 1 mg/dL (ref 0.3–1.2)
Total Protein: 6.4 g/dL — ABNORMAL LOW (ref 6.5–8.1)

## 2018-09-23 LAB — CBC WITH DIFFERENTIAL/PLATELET
Abs Immature Granulocytes: 0.13 10*3/uL — ABNORMAL HIGH (ref 0.00–0.07)
BASOS ABS: 0.1 10*3/uL (ref 0.0–0.1)
BASOS PCT: 0 %
EOS ABS: 0.1 10*3/uL (ref 0.0–0.5)
Eosinophils Relative: 1 %
HEMATOCRIT: 26.2 % — AB (ref 39.0–52.0)
Hemoglobin: 7.6 g/dL — ABNORMAL LOW (ref 13.0–17.0)
Immature Granulocytes: 1 %
Lymphocytes Relative: 4 %
Lymphs Abs: 0.7 10*3/uL (ref 0.7–4.0)
MCH: 20.2 pg — ABNORMAL LOW (ref 26.0–34.0)
MCHC: 29 g/dL — ABNORMAL LOW (ref 30.0–36.0)
MCV: 69.7 fL — AB (ref 80.0–100.0)
Monocytes Absolute: 1.2 10*3/uL — ABNORMAL HIGH (ref 0.1–1.0)
Monocytes Relative: 7 %
NEUTROS PCT: 87 %
NRBC: 0 % (ref 0.0–0.2)
Neutro Abs: 15.8 10*3/uL — ABNORMAL HIGH (ref 1.7–7.7)
Platelets: 471 10*3/uL — ABNORMAL HIGH (ref 150–400)
RBC: 3.76 MIL/uL — ABNORMAL LOW (ref 4.22–5.81)
RDW: 17.2 % — ABNORMAL HIGH (ref 11.5–15.5)
Smear Review: UNDETERMINED
WBC: 18 10*3/uL — ABNORMAL HIGH (ref 4.0–10.5)

## 2018-09-23 LAB — THYROID PANEL WITH TSH
FREE THYROXINE INDEX: 1.9 (ref 1.2–4.9)
T3 UPTAKE RATIO: 31 % (ref 24–39)
T4, Total: 6 ug/dL (ref 4.5–12.0)
TSH: 2.48 u[IU]/mL (ref 0.450–4.500)

## 2018-09-23 MED ORDER — CEFAZOLIN SODIUM-DEXTROSE 2-4 GM/100ML-% IV SOLN
2.0000 g | INTRAVENOUS | Status: AC
  Administered 2018-09-24: 2 g via INTRAVENOUS

## 2018-09-23 NOTE — Patient Instructions (Signed)
Your procedure is scheduled on: 08/25/18 11:30 AM Report to Day Surgery. MEDICAL [facility]ALL   Remember: Instructions that are not followed completely may result in serious medical risk,  up to and including death, or upon the discretion of your surgeon and anesthesiologist your  surgery may need to be rescheduled.     _X__ 1. Do not eat food after midnight the night before your procedure.                 No gum chewing or hard candies. You may drink clear liquids up to 2 hours                 before you are scheduled to arrive for your surgery- DO not drink clear                 liquids within 2 hours of the start of your surgery.                 Clear Liquids include:  water, apple juice without pulp, clear carbohydrate                 drink such as Clearfast of Gatorade, Black Coffee or Tea (Do not add                 anything to coffee or tea).  __X__2.  On the morning of surgery brush your teeth with toothpaste and water, you                may rinse your mouth with mouthwash if you wish.  Do not swallow any toothpaste of mouthwash.     _X__ 3.  No Alcohol for 24 hours before or after surgery.   _X__ 4.  Do Not Smoke or use e-cigarettes For 24 Hours Prior to Your Surgery.                 Do not use any chewable tobacco products for at least 6 hours prior to                 surgery.  ____  5.  Bring all medications with you on the day of surgery if instructed.   _X___  6.  Notify your doctor if there is any change in your medical condition      (cold, fever, infections).     Do not wear jewelry, make-up, hairpins, clips or nail polish. Do not wear lotions, powders, or perfumes. You may wear deodorant. Do not shave 48 hours prior to surgery. Men may shave face and neck. Do not bring valuables to the hospital.    [facility] is not responsible for any belongings or valuables.  Contacts, dentures or bridgework may not be worn into surgery. Leave your suitcase in  the car. After surgery it may be brought to your room. For patients admitted to the hospital, discharge time is determined by your treatment team.   Patients discharged the day of surgery will not be allowed to drive home.   Please read over the following fact sheets that you were given:   Surgical Site Infection Prevention  ___X_ Take these medicines the morning of surgery with A SIP OF WATER:    1. MAY TAKE PAIN MEDICATION AS OFTEN AS PRESCRIBED  2.   3.   4.  5.  6.  ____ Fleet Enema (as directed)   _X___ Use CHG Soap as directed  ____ Use inhalers on the day of surgery  ____ Stop metformin 2 days prior to surgery    ____ Take 1/2 of usual insulin dose the night before surgery. No insulin the morning          of surgery.   ____ Stop Coumadin/Plavix/aspirin on  ____ Stop Anti-inflammatories on ____ Stop supplements until after surgery.    ____ Bring C-Pap to the hospital.

## 2018-09-24 ENCOUNTER — Encounter
Admission: RE | Disposition: A | Discharge status: Home / Self Care | Payer: Self-pay | Source: Home / Self Care | Attending: Surgery

## 2018-09-24 ENCOUNTER — Observation Stay
Admission: RE | Admit: 2018-09-24 | Discharge: 2018-09-25 | Disposition: A | Discharge status: Home / Self Care | Payer: Medicare Other | Attending: Surgery | Admitting: Surgery

## 2018-09-24 ENCOUNTER — Ambulatory Visit: Payer: Medicare Other | Admitting: Anesthesiology

## 2018-09-24 DIAGNOSIS — C439 Malignant melanoma of skin, unspecified: Secondary | ICD-10-CM | POA: Diagnosis not present

## 2018-09-24 DIAGNOSIS — S41102A Unspecified open wound of left upper arm, initial encounter: Secondary | ICD-10-CM | POA: Diagnosis not present

## 2018-09-24 DIAGNOSIS — G8929 Other chronic pain: Secondary | ICD-10-CM | POA: Insufficient documentation

## 2018-09-24 DIAGNOSIS — C773 Secondary and unspecified malignant neoplasm of axilla and upper limb lymph nodes: Secondary | ICD-10-CM | POA: Insufficient documentation

## 2018-09-24 DIAGNOSIS — C4362 Malignant melanoma of left upper limb, including shoulder: Secondary | ICD-10-CM | POA: Diagnosis not present

## 2018-09-24 DIAGNOSIS — Z79899 Other long term (current) drug therapy: Secondary | ICD-10-CM | POA: Insufficient documentation

## 2018-09-24 DIAGNOSIS — L98493 Non-pressure chronic ulcer of skin of other sites with necrosis of muscle: Secondary | ICD-10-CM | POA: Diagnosis not present

## 2018-09-24 DIAGNOSIS — I959 Hypotension, unspecified: Secondary | ICD-10-CM | POA: Insufficient documentation

## 2018-09-24 DIAGNOSIS — R222 Localized swelling, mass and lump, trunk: Secondary | ICD-10-CM | POA: Diagnosis not present

## 2018-09-24 DIAGNOSIS — Z79891 Long term (current) use of opiate analgesic: Secondary | ICD-10-CM | POA: Insufficient documentation

## 2018-09-24 DIAGNOSIS — S41102S Unspecified open wound of left upper arm, sequela: Secondary | ICD-10-CM

## 2018-09-24 DIAGNOSIS — I96 Gangrene, not elsewhere classified: Secondary | ICD-10-CM

## 2018-09-24 DIAGNOSIS — M1990 Unspecified osteoarthritis, unspecified site: Secondary | ICD-10-CM | POA: Diagnosis not present

## 2018-09-24 DIAGNOSIS — D630 Anemia in neoplastic disease: Secondary | ICD-10-CM | POA: Insufficient documentation

## 2018-09-24 DIAGNOSIS — M25562 Pain in left knee: Secondary | ICD-10-CM | POA: Insufficient documentation

## 2018-09-24 DIAGNOSIS — D649 Anemia, unspecified: Secondary | ICD-10-CM | POA: Diagnosis not present

## 2018-09-24 HISTORY — PX: WOUND DEBRIDEMENT: SHX247

## 2018-09-24 LAB — CBC
HCT: 21.9 % — ABNORMAL LOW (ref 39.0–52.0)
HCT: 28.2 % — ABNORMAL LOW (ref 39.0–52.0)
Hemoglobin: 6.7 g/dL — ABNORMAL LOW (ref 13.0–17.0)
Hemoglobin: 8.9 g/dL — ABNORMAL LOW (ref 13.0–17.0)
MCH: 21.3 pg — ABNORMAL LOW (ref 26.0–34.0)
MCH: 22.6 pg — ABNORMAL LOW (ref 26.0–34.0)
MCHC: 30.6 g/dL (ref 30.0–36.0)
MCHC: 31.6 g/dL (ref 30.0–36.0)
MCV: 69.7 fL — ABNORMAL LOW (ref 80.0–100.0)
MCV: 71.8 fL — ABNORMAL LOW (ref 80.0–100.0)
Platelets: 284 10*3/uL (ref 150–400)
Platelets: 290 10*3/uL (ref 150–400)
RBC: 3.14 MIL/uL — ABNORMAL LOW (ref 4.22–5.81)
RBC: 3.93 MIL/uL — ABNORMAL LOW (ref 4.22–5.81)
RDW: 17.5 % — ABNORMAL HIGH (ref 11.5–15.5)
RDW: 19.2 % — ABNORMAL HIGH (ref 11.5–15.5)
WBC: 15.1 10*3/uL — ABNORMAL HIGH (ref 4.0–10.5)
WBC: 15.7 10*3/uL — ABNORMAL HIGH (ref 4.0–10.5)
nRBC: 0 % (ref 0.0–0.2)
nRBC: 0 % (ref 0.0–0.2)

## 2018-09-24 LAB — PREPARE RBC (CROSSMATCH)

## 2018-09-24 SURGERY — DEBRIDEMENT, WOUND
Anesthesia: General | Laterality: Left

## 2018-09-24 MED ORDER — CEFAZOLIN SODIUM-DEXTROSE 2-4 GM/100ML-% IV SOLN
INTRAVENOUS | Status: AC
  Filled 2018-09-24: qty 100

## 2018-09-24 MED ORDER — LACTATED RINGERS IV SOLN
INTRAVENOUS | Status: DC
  Administered 2018-09-24 – 2018-09-25 (×2): via INTRAVENOUS

## 2018-09-24 MED ORDER — SODIUM CHLORIDE (PF) 0.9 % IJ SOLN
INTRAMUSCULAR | Status: AC
  Filled 2018-09-24: qty 10

## 2018-09-24 MED ORDER — LACTATED RINGERS IV SOLN
INTRAVENOUS | Status: DC
  Administered 2018-09-24: 07:00:00 via INTRAVENOUS

## 2018-09-24 MED ORDER — FENTANYL CITRATE (PF) 100 MCG/2ML IJ SOLN
INTRAMUSCULAR | Status: DC | PRN
  Administered 2018-09-24: 25 ug via INTRAVENOUS

## 2018-09-24 MED ORDER — OXYCODONE HCL 5 MG PO TABS: 5.0000 mg | ORAL_TABLET | ORAL | Status: DC | PRN

## 2018-09-24 MED ORDER — SODIUM CHLORIDE 0.9% IV SOLUTION: Freq: Once | INTRAVENOUS | Status: DC

## 2018-09-24 MED ORDER — SCOPOLAMINE 1 MG/3DAYS TD PT72
1.0000 | MEDICATED_PATCH | Freq: Once | TRANSDERMAL | Status: DC
  Administered 2018-09-24: 1.5 mg via TRANSDERMAL

## 2018-09-24 MED ORDER — LIDOCAINE HCL (CARDIAC) PF 100 MG/5ML IV SOSY
PREFILLED_SYRINGE | INTRAVENOUS | Status: DC | PRN
  Administered 2018-09-24 (×4): 20 mg via INTRAVENOUS

## 2018-09-24 MED ORDER — GABAPENTIN 300 MG PO CAPS
ORAL_CAPSULE | ORAL | Status: AC
  Administered 2018-09-24: 300 mg via ORAL
  Filled 2018-09-24: qty 1

## 2018-09-24 MED ORDER — ONDANSETRON HCL 4 MG/2ML IJ SOLN
INTRAMUSCULAR | Status: AC
  Filled 2018-09-24: qty 2

## 2018-09-24 MED ORDER — PHENYLEPHRINE HCL 10 MG/ML IJ SOLN
INTRAMUSCULAR | Status: DC | PRN
  Administered 2018-09-24 (×3): 100 ug via INTRAVENOUS
  Administered 2018-09-24: 150 ug via INTRAVENOUS
  Administered 2018-09-24: 50 ug via INTRAVENOUS
  Administered 2018-09-24 (×2): 100 ug via INTRAVENOUS
  Administered 2018-09-24: 50 ug via INTRAVENOUS
  Administered 2018-09-24: 100 ug via INTRAVENOUS
  Administered 2018-09-24: 50 ug via INTRAVENOUS
  Administered 2018-09-24: 100 ug via INTRAVENOUS

## 2018-09-24 MED ORDER — KETAMINE HCL 10 MG/ML IJ SOLN
INTRAMUSCULAR | Status: DC | PRN
  Administered 2018-09-24: 30 mg via INTRAVENOUS
  Administered 2018-09-24: 20 mg via INTRAVENOUS

## 2018-09-24 MED ORDER — PROPOFOL 10 MG/ML IV BOLUS
INTRAVENOUS | Status: AC
  Filled 2018-09-24: qty 40

## 2018-09-24 MED ORDER — SODIUM CHLORIDE 0.9 % IV SOLN
INTRAVENOUS | Status: DC
  Administered 2018-09-24: 07:00:00 via INTRAVENOUS

## 2018-09-24 MED ORDER — DEXAMETHASONE SODIUM PHOSPHATE 10 MG/ML IJ SOLN
INTRAMUSCULAR | Status: DC | PRN
  Administered 2018-09-24: 5 mg via INTRAVENOUS

## 2018-09-24 MED ORDER — KETAMINE HCL 50 MG/ML IJ SOLN
INTRAMUSCULAR | Status: AC
  Filled 2018-09-24: qty 10

## 2018-09-24 MED ORDER — FENTANYL CITRATE (PF) 100 MCG/2ML IJ SOLN
INTRAMUSCULAR | Status: AC
  Filled 2018-09-24: qty 2

## 2018-09-24 MED ORDER — FENTANYL CITRATE (PF) 100 MCG/2ML IJ SOLN
25.0000 ug | INTRAMUSCULAR | Status: DC | PRN
  Administered 2018-09-24: 25 ug via INTRAVENOUS

## 2018-09-24 MED ORDER — FENTANYL CITRATE (PF) 100 MCG/2ML IJ SOLN
INTRAMUSCULAR | Status: AC
  Administered 2018-09-24: 25 ug via INTRAVENOUS
  Filled 2018-09-24: qty 2

## 2018-09-24 MED ORDER — PROPOFOL 500 MG/50ML IV EMUL
INTRAVENOUS | Status: DC | PRN
  Administered 2018-09-24: 150 ug/kg/min via INTRAVENOUS

## 2018-09-24 MED ORDER — SCOPOLAMINE 1 MG/3DAYS TD PT72
MEDICATED_PATCH | TRANSDERMAL | Status: AC
  Administered 2018-09-24: 1.5 mg via TRANSDERMAL
  Filled 2018-09-24: qty 1

## 2018-09-24 MED ORDER — DEXMEDETOMIDINE HCL IN NACL 200 MCG/50ML IV SOLN
INTRAVENOUS | Status: AC
  Filled 2018-09-24: qty 50

## 2018-09-24 MED ORDER — ONDANSETRON HCL 4 MG/2ML IJ SOLN
INTRAMUSCULAR | Status: AC
  Administered 2018-09-24: 4 mg via INTRAVENOUS
  Filled 2018-09-24: qty 2

## 2018-09-24 MED ORDER — ONDANSETRON 4 MG PO TBDP: 4.0000 mg | ORAL_TABLET | Freq: Four times a day (QID) | ORAL | Status: DC | PRN

## 2018-09-24 MED ORDER — ACETAMINOPHEN 500 MG PO TABS
1000.0000 mg | ORAL_TABLET | ORAL | Status: AC
  Administered 2018-09-24: 1000 mg via ORAL

## 2018-09-24 MED ORDER — ONDANSETRON HCL 4 MG/2ML IJ SOLN
4.0000 mg | Freq: Four times a day (QID) | INTRAMUSCULAR | Status: DC | PRN
  Administered 2018-09-24: 4 mg via INTRAVENOUS

## 2018-09-24 MED ORDER — ONDANSETRON HCL 4 MG/2ML IJ SOLN
INTRAMUSCULAR | Status: DC | PRN
  Administered 2018-09-24: 4 mg via INTRAVENOUS

## 2018-09-24 MED ORDER — EPHEDRINE SULFATE 50 MG/ML IJ SOLN
5.0000 mg | Freq: Once | INTRAMUSCULAR | Status: AC
  Administered 2018-09-24: 5 mg via INTRAVENOUS

## 2018-09-24 MED ORDER — PROPOFOL 10 MG/ML IV BOLUS
INTRAVENOUS | Status: AC
  Filled 2018-09-24: qty 20

## 2018-09-24 MED ORDER — LIDOCAINE HCL (PF) 1 % IJ SOLN
INTRAMUSCULAR | Status: AC
  Filled 2018-09-24: qty 30

## 2018-09-24 MED ORDER — FAMOTIDINE 20 MG PO TABS
20.0000 mg | ORAL_TABLET | Freq: Once | ORAL | Status: AC
  Administered 2018-09-24: 20 mg via ORAL

## 2018-09-24 MED ORDER — ACETAMINOPHEN 500 MG PO TABS
ORAL_TABLET | ORAL | Status: AC
  Administered 2018-09-24: 1000 mg via ORAL
  Filled 2018-09-24: qty 2

## 2018-09-24 MED ORDER — CHLORHEXIDINE GLUCONATE CLOTH 2 % EX PADS: 6.0000 | MEDICATED_PAD | Freq: Once | CUTANEOUS | Status: DC

## 2018-09-24 MED ORDER — ACETAMINOPHEN 500 MG PO TABS
1000.0000 mg | ORAL_TABLET | Freq: Four times a day (QID) | ORAL | Status: DC
  Administered 2018-09-24 – 2018-09-25 (×3): 1000 mg via ORAL
  Filled 2018-09-24 (×4): qty 2

## 2018-09-24 MED ORDER — SUGAMMADEX SODIUM 200 MG/2ML IV SOLN
INTRAVENOUS | Status: AC
  Filled 2018-09-24: qty 2

## 2018-09-24 MED ORDER — ROCURONIUM BROMIDE 50 MG/5ML IV SOLN
INTRAVENOUS | Status: AC
  Filled 2018-09-24: qty 1

## 2018-09-24 MED ORDER — DEXAMETHASONE SODIUM PHOSPHATE 10 MG/ML IJ SOLN
INTRAMUSCULAR | Status: AC
  Filled 2018-09-24: qty 1

## 2018-09-24 MED ORDER — FAMOTIDINE 20 MG PO TABS
ORAL_TABLET | ORAL | Status: AC
  Administered 2018-09-24: 20 mg via ORAL
  Filled 2018-09-24: qty 1

## 2018-09-24 MED ORDER — PROPOFOL 10 MG/ML IV BOLUS
INTRAVENOUS | Status: DC | PRN
  Administered 2018-09-24: 150 mg via INTRAVENOUS

## 2018-09-24 MED ORDER — IBUPROFEN 400 MG PO TABS
600.0000 mg | ORAL_TABLET | Freq: Four times a day (QID) | ORAL | Status: DC | PRN
  Filled 2018-09-24: qty 1

## 2018-09-24 MED ORDER — DEXMEDETOMIDINE HCL 200 MCG/2ML IV SOLN
INTRAVENOUS | Status: DC | PRN
  Administered 2018-09-24 (×5): 4 ug via INTRAVENOUS

## 2018-09-24 MED ORDER — SODIUM CHLORIDE FLUSH 0.9 % IV SOLN
INTRAVENOUS | Status: AC
  Filled 2018-09-24: qty 10

## 2018-09-24 MED ORDER — BUPIVACAINE HCL (PF) 0.5 % IJ SOLN
INTRAMUSCULAR | Status: AC
  Filled 2018-09-24: qty 30

## 2018-09-24 MED ORDER — GABAPENTIN 300 MG PO CAPS
300.0000 mg | ORAL_CAPSULE | ORAL | Status: AC
  Administered 2018-09-24: 300 mg via ORAL

## 2018-09-24 MED ORDER — MIDAZOLAM HCL 2 MG/2ML IJ SOLN
INTRAMUSCULAR | Status: AC
  Filled 2018-09-24: qty 2

## 2018-09-24 MED ORDER — SODIUM CHLORIDE FLUSH 0.9 % IV SOLN
INTRAVENOUS | Status: AC
  Filled 2018-09-24: qty 3

## 2018-09-24 MED ORDER — ONDANSETRON HCL 4 MG/2ML IJ SOLN
4.0000 mg | Freq: Once | INTRAMUSCULAR | Status: AC | PRN
  Administered 2018-09-24: 4 mg via INTRAVENOUS

## 2018-09-24 MED ORDER — EPHEDRINE SULFATE 50 MG/ML IJ SOLN
INTRAMUSCULAR | Status: AC
  Administered 2018-09-24: 5 mg via INTRAVENOUS
  Filled 2018-09-24: qty 1

## 2018-09-24 MED ORDER — MIDAZOLAM HCL 2 MG/2ML IJ SOLN
INTRAMUSCULAR | Status: DC | PRN
  Administered 2018-09-24: 2 mg via INTRAVENOUS

## 2018-09-24 SURGICAL SUPPLY — 21 items
BNDG GAUZE 4.5X4.1 6PLY STRL (MISCELLANEOUS) ×3 IMPLANT
COVER WAND RF STERILE (DRAPES) ×3 IMPLANT
ELECT REM PT RETURN 9FT ADLT (ELECTROSURGICAL) ×3
ELECTRODE REM PT RTRN 9FT ADLT (ELECTROSURGICAL) ×1 IMPLANT
GAUZE SPONGE 4X4 12PLY STRL (GAUZE/BANDAGES/DRESSINGS) ×3 IMPLANT
GLOVE BIOGEL PI IND STRL 7.0 (GLOVE) ×1 IMPLANT
GLOVE BIOGEL PI IND STRL 7.5 (GLOVE) ×1 IMPLANT
GLOVE BIOGEL PI INDICATOR 7.0 (GLOVE) ×2
GLOVE BIOGEL PI INDICATOR 7.5 (GLOVE) ×2
GLOVE ECLIPSE 7.0 STRL STRAW (GLOVE) ×3 IMPLANT
GOWN STRL REUS W/TWL LRG LVL3 (GOWN DISPOSABLE) ×6 IMPLANT
HANDPIECE INTERPULSE COAX TIP (DISPOSABLE) ×2
KIT TURNOVER KIT A (KITS) ×3 IMPLANT
NEEDLE HYPO 25X1 1.5 SAFETY (NEEDLE) ×3 IMPLANT
NS IRRIG 1000ML POUR BTL (IV SOLUTION) ×3 IMPLANT
PACK BASIN MINOR ARMC (MISCELLANEOUS) ×3 IMPLANT
SET HNDPC FAN SPRY TIP SCT (DISPOSABLE) ×1 IMPLANT
SOL .9 NS 3000ML IRR  AL (IV SOLUTION) ×2
SOL .9 NS 3000ML IRR UROMATIC (IV SOLUTION) ×1 IMPLANT
SPONGE LAP 18X18 RF (DISPOSABLE) ×9 IMPLANT
SYR BULB IRRIG 60ML STRL (SYRINGE) ×3 IMPLANT

## 2018-09-24 NOTE — H&P (Addendum)
SURGICAL HISTORY & PHYSICAL   HISTORY OF PRESENT ILLNESS (HPI):  Louis Davies is a 67 y.o. male who underwent debridement of necrotic left axillary wound, likely necrotic tumor, with Dr Rosana Hoes. In the post-operative period in the PACU he had episodes of hypotension despite receiving 3L IVF and 2 units of pRBCs. He remained somnolent but aroused to verbal stimuli. He denied any fevers, chills, worsening pain or drainage in his left axilla. He did endorse some lightheadedness, but he has not been active since the surgery.    PAST MEDICAL HISTORY (PMH):  Past Medical History:  Diagnosis Date  . Anemia   . Arthritis   . Cancer ([place])    MELANOMA  . Complication of anesthesia   . Dyspnea    doe  . Perforated viscus   . PONV (postoperative nausea and vomiting)     Reviewed. Otherwise negative.   PAST SURGICAL HISTORY ([place]):  Past Surgical History:  Procedure Laterality Date  . AXILLARY LYMPH NODE DISSECTION Left 06/26/2017   Procedure: EXCISION OF LEFT AXILLARY MASS;  Surgeon: Johnathan Hausen, MD;  Location: [facility] ORS;  Service: General;  Laterality: Left;  . DIAGNOSTIC LAPAROSCOPY    . EMBOLIZATION Left 05/11/2018   Procedure: EMBOLIZATION;  Surgeon: Katha Cabal, MD;  Location: [facility];  Service: Cardiovascular;  Laterality: Left;  . LAPAROTOMY N/A 10/14/2017   Procedure: EXPLORATORY LAPAROTOMY;  Surgeon: Clayburn Pert, MD;  Location: [facility] ORS;  Service: General;  Laterality: N/A;  . VASECTOMY      Reviewed. Otherwise negative.   MEDICATIONS:  Prior to Admission medications   Medication Sig Start Date End Date Taking? Authorizing Provider  Multiple Vitamin (MULTIVITAMIN) tablet Take 1 tablet by mouth daily.   Yes [provider]  Oxycodone HCl 10 MG TABS Take 1 tablet (10 mg total) by mouth every 6 (six) hours as needed. Patient taking differently: Take 10 mg by mouth every 12 (twelve) hours as needed (for severe pain).  05/04/18  Yes Finnegan,  Kathlene November, MD  ZELBORAF 240 MG tablet TAKE 3 TABLETS (720 MG TOTAL) BY MOUTH EVERY 12 HOURS. Patient taking differently: Take 720 mg by mouth every 12 (twelve) hours.  08/03/18   Lloyd Huger, MD     ALLERGIES:  No Known Allergies   SOCIAL HISTORY:  Social History   Socioeconomic History  . Marital status: Divorced    Spouse name: Not on file  . Number of children: Not on file  . Years of education: Not on file  . Highest education level: Not on file  Occupational History  . Not on file  Social Needs  . Financial resource strain: Not on file  . Food insecurity:    Worry: Not on file    Inability: Not on file  . Transportation needs:    Medical: Not on file    Non-medical: Not on file  Tobacco Use  . Smoking status: Never Smoker  . Smokeless tobacco: Never Used  Substance and Sexual Activity  . Alcohol use: No  . Drug use: No  . Sexual activity: Yes  Lifestyle  . Physical activity:    Days per week: Not on file    Minutes per session: Not on file  . Stress: Not on file  Relationships  . Social connections:    Talks on phone: Not on file    Gets together: Not on file    Attends religious service: Not on file  Active member of club or organization: Not on file    Attends meetings of clubs or organizations: Not on file    Relationship status: Not on file  . Intimate partner violence:    Fear of current or ex partner: Not on file    Emotionally abused: Not on file    Physically abused: Not on file    Forced sexual activity: Not on file  Other Topics Concern  . Not on file  Social History Narrative  . Not on file    The patient currently resides (home / rehab facility / nursing home): Home The patient normally is (ambulatory / bedbound): Ambulatory  FAMILY HISTORY:  Family History  Problem Relation Age of Onset  . Diabetes Mother   . Hypertension Mother   . Leukemia Father   . Thyroid disease Sister   . Liver disease Maternal Grandmother   .  Heart Problems Maternal Grandfather   . Diabetes Maternal Grandfather   . Lung cancer Paternal Grandmother   . Prostate cancer Paternal Grandfather     Otherwise negative.   REVIEW OF SYSTEMS:  Constitutional: denies any other weight loss, fever, chills, or sweats  Eyes: denies any other vision changes, history of eye injury  ENT: denies sore throat, hearing problems  Respiratory: denies shortness of breath, wheezing  Cardiovascular: denies chest pain, palpitations  Gastrointestinal: denies abdominal pain, N/V, or diarrhea/and bowel function as per HPI  Genitourinary: denies burning with urination or urinary frequency Musculoskeletal: denies any other joint pains or cramps  Skin: Denies any other rashes or skin discolorations except left axillary wound Neurological: denies any other headache, dizziness, weakness, + for lightheadedness Psychiatric: denies any other depression, anxiety   All other review of systems were otherwise negative.  VITAL SIGNS:  Temp:  [96.7 F (35.9 C)-101.2 F (38.4 C)] 97.4 F (36.3 C) (12/06 1315) Pulse Rate:  [55-111] 59 (12/06 1545) Resp:  [8-26] 9 (12/06 1545) BP: (71-112)/(50-72) 108/66 (12/06 1545) SpO2:  [93 %-100 %] 100 % (12/06 1545)             INTAKE/OUTPUT:  This shift: Total I/O In: 1780 [P.O.:30; I.V.:710; Blood:940; IV Piggyback:100] Out: 780 [Urine:750; Blood:30]  Last 2 shifts: @IOLAST2SHIFTS @  PHYSICAL EXAM:  Constitutional:  -- Normal body habitus  -- Somnolent but arouses to verbal stimuli Eyes:  -- Pupils equally round and reactive to light  -- No scleral icterus, B/L no occular discharge Ear, nose, throat: -- Neck is FROM WNL Pulmonary:  -- No wheezes or rhales -- Equal breath sounds bilaterally -- Breathing non-labored at rest Cardiovascular:  -- S1, S2 present  -- No pericardial rubs  Musculoskeletal and Integumentary:  -- Wounds or skin discoloration: Left axillary wound packed with kerlix guaze with  serosanguinous fluid saturating them, minimal bleeding with dressing change, no evidence of purulent drainage. Erythema surrounding wound.  -- Extremities:  hands and feet warm, no edema     Labs:  CBC Latest Ref Rng & Units 09/24/2018 09/24/2018 09/23/2018  WBC 4.0 - 10.5 K/uL 15.7(H) 15.1(H) 18.0(H)  Hemoglobin 13.0 - 17.0 g/dL 8.9(L) 6.7(L) 7.6(L)  Hematocrit 39.0 - 52.0 % 28.2(L) 21.9(L) 26.2(L)  Platelets 150 - 400 K/uL 290 284 471(H)   CMP Latest Ref Rng & Units 09/23/2018 09/22/2018 08/19/2018  Glucose 70 - 99 mg/dL 114(H) 101(H) 136(H)  BUN 8 - 23 mg/dL 12 11 13   Creatinine 0.61 - 1.24 mg/dL 1.07 1.10 1.24  Sodium 135 - 145 mmol/L 130(L) 133(L) 139  Potassium  3.5 - 5.1 mmol/L 4.0 4.0 3.7  Chloride 98 - 111 mmol/L 95(L) 97(L) 103  CO2 22 - 32 mmol/L 25 26 26   Calcium 8.9 - 10.3 mg/dL 7.8(L) 8.3(L) 8.8(L)  Total Protein 6.5 - 8.1 g/dL 6.4(L) 7.0 7.6  Total Bilirubin 0.3 - 1.2 mg/dL 1.0 1.0 0.8  Alkaline Phos 38 - 126 U/L 49 49 54  AST 15 - 41 U/L 15 11(L) 14(L)  ALT 0 - 44 U/L 9 9 10       Assessment/Plan:  67 y.o. male with delayed recovery from anesthesia and mildly symptomatic hypotension who underwent left axillary wound debridement today (09/24/18) for a likely necrotic tumor tissue given his history of metastatic melenoma   - Admit to general surgery for observation   - Clear liquid diet, advance as tolerates, continue IVF  - Monitor Hgb (morning CBC, BMP)  - Pain control as needed, anti-emetics as needed  - Dry dressing changes daily  - Mobilize once BP improved and patient more alert   - DVT prophylaxis (SCDs only for now given concern for anemia, consider enoxaparin tomorrow)   All of the above findings and recommendations were discussed with the patient and his family, and all of their questions were answered to their expressed satisfaction.  -- Edison Simon, PA-C [facility] 09/24/2018, 4:04 PM [phone] M-F: 7am - 4pm

## 2018-09-24 NOTE — Op Note (Signed)
SURGICAL OPERATIVE REPORT   DATE OF PROCEDURE: 09/24/2018  ATTENDING Surgeon(s): Vickie Epley, MD  ANESTHESIA: GETA  PRE-OPERATIVE DIAGNOSIS: Large necrotic Left axillary mass (icd-10's: L98.493, I96, C43.9)   POST-OPERATIVE DIAGNOSIS: Large necrotic Left axillary mass (icd-10's: L98.493, I96, C43.9)   PROCEDURE(S):  1.) Excision of large necrotic Left axillary mass (8 cm deep x 8.5 cm A-P x 7 cm wide) (cpt: 03500) 2.) Sharp excisional debridement of Left axillary soft tissue/tumor necrosis (cpt's: 93818, 11046 x2)   INTRAOPERATIVE FINDINGS: 8 cm deep x 8.5 cm A-P x 7 cm wide Left axillary mass with underlying necrotic muscle, tumor, subcutaneous fat, and skin, including ~2 - 3 cm of circumferential undermining, for which 1 cm of nonviable skin was excised using electrocautery   INTRAVENOUS FLUIDS: 600 mL crystalloid + pre-operatively planned transfusion 1 Unit PRBC (2nd to be transfused in PACU)   ESTIMATED BLOOD LOSS: 30 mL   URINE OUTPUT: No Foley catheter    SPECIMENS: None   IMPLANTS: 1.5 rolls Kerlix gauze packing   DRAINS: None   COMPLICATIONS: None apparent   CONDITION AT END OF PROCEDURE: Hemodynamically stable and extubated   DISPOSITION OF PATIENT: PACU   INDICATIONS FOR PROCEDURE:  Patient is a 67 y.o. male with a history of metastatic melanoma s/p open Left axillary biopsy (06/2017), treated initially with immunotherapy until patient developed bowel perforation, requiring laparotomy (09/2017), following which patient was started on oral chemotherapy, on which most recent PET-CT demonstrated Left axillary tumor necrosis without central hypermetabolism. Also during the time between laparotomy and now, patient underwent coil embolization of Left subscapular and lateral thoracic arteries (04/2018) for Left axillary bleeding mass/wound. More recently, patient's open Left axillary wound has been managed by wound clinic  until wound developed foul odor with enlarging necrotic Left axillary mass, for which he was referred to surgery. All risks, benefits, and alternatives to excision of large necrotic Left axillary mass and debridement of Left axillary wound necrosis were discussed with the patient, all of patient's questions were answered to his expressed satisfaction, and informed consent was obtained and documented.   DETAILS OF PROCEDURE: Patient was brought to the operating suite and appropriately identified. Operative site was prepped and draped in the usual sterile fashion, and following a brief time out, blunt gloved finger fracture and extraction of the large necrotic Left axillary mass was performed. During the course of blunt dissection, a single one of patient's former embolization coils was found to be centrally located within the large necrotic Left axillary mass. It too was removed and cut upon appreciation of tension between the embolization coil and remaining viable tissue. Following excision/extraction of the large necrotic Left axillary mass, there remained significant additional tissue/tumor necrosis, which was sharply debrided using scissors and selective electrocautery. Necrosis extended to and included muscle with at least 2 - 3 cm of circumferential undermining noted, for which non-viable skin was excised likewise. Direct pressure was then applied for hemostasis with selective electrocautery as needed. Surrounding skin was cleaned and dried, and 1.5 rolls of dry Kerlix gauze rolls were packed into patient's resulting Left axillary wound, and a non-adhesive dressing was applied. Patient was then safely able to be extubated and transferred to PACU for post-operative monitoring and care. During patient's surgery, the first of two planned units of PRBC was transfused due to subacute blood loss anemia.   I was present for all aspects of the above procedure, and no operative complications were apparent.

## 2018-09-24 NOTE — Anesthesia Post-op Follow-up Note (Signed)
Anesthesia QCDR form completed.        

## 2018-09-24 NOTE — Anesthesia Procedure Notes (Signed)
Procedure Name: LMA Insertion Date/Time: 09/24/2018 7:53 AM Performed by: Gunnar Fusi, MD Pre-anesthesia Checklist: Patient identified, Emergency Drugs available, Suction available, Patient being monitored and Timeout performed Patient Re-evaluated:Patient Re-evaluated prior to induction Oxygen Delivery Method: Circle system utilized, Simple face mask, Non-rebreather mask, Nasal cannula and Ambu bag Preoxygenation: Pre-oxygenation with 100% oxygen Induction Type: IV induction Ventilation: Mask ventilation without difficulty LMA: LMA inserted LMA Size: 4.5 Number of attempts: 1 Dental Injury: Teeth and Oropharynx as per pre-operative assessment

## 2018-09-24 NOTE — Anesthesia Postprocedure Evaluation (Signed)
Anesthesia Post Note  Patient: Louis Davies  Procedure(s) Performed: LEFT AXILLARY EXCISIONAL DEBRIDEMENT OF NECROTIC TISSUE (Left )  Patient location during evaluation: PACU Anesthesia Type: General Level of consciousness: awake and alert Pain management: pain level controlled Vital Signs Assessment: post-procedure vital signs reviewed and stable Respiratory status: spontaneous breathing and respiratory function stable Cardiovascular status: stable Anesthetic complications: no     Last Vitals:  Vitals:   09/24/18 0935 09/24/18 0937  BP: (!) 88/65 107/65  Pulse: 62 (!) 59  Resp: 17 16  Temp:    SpO2: 99% 100%    Last Pain:  Vitals:   09/24/18 0915  TempSrc:   PainSc: Asleep                 [name]

## 2018-09-24 NOTE — Interval H&P Note (Signed)
History and Physical Interval Note:  09/24/2018 7:27 AM  Louis Davies  has presented today for surgery, with the diagnosis of open wound of left axillary region  The various methods of treatment have been discussed with the patient and family. After consideration of risks, benefits and other options for treatment, the patient has consented to  Procedure(s): LEFT AXILLARY EXCISIONAL DEBRIDEMENT OF NECROTIC TISSUE (Left) as a surgical intervention .  The patient's history has been reviewed, patient examined, no change in status, stable for surgery.  I have reviewed the patient's chart and labs.  Questions were answered to the patient's satisfaction.     Vickie Epley

## 2018-09-24 NOTE — Progress Notes (Signed)
Spoke with Dr. Rosana Hoes on the phone regarding pt's progress.  BP remains soft, and pt cont to sleep a lot but is easily arousable.  Per Dr. Rosana Hoes, after [place], [place] evaluates, pt may be admitted for observation.  Dsg left chest is draining serosanginous liquid and orders obtained to change dsg as needed.  Zack at bs for evaluation and dsg change.  Bubba Camp, RN

## 2018-09-24 NOTE — OR Nursing (Signed)
Dr Ronelle Nigh  made aware of abnormal labs pre op labs, orders received.  Vital signs reported as well, no new orders.  Will notify Dr Rosana Hoes as well upon his arrival to unit.

## 2018-09-24 NOTE — OR Nursing (Signed)
Wound : 8" deep, 8.5" AP,  7" wide

## 2018-09-24 NOTE — Discharge Instructions (Addendum)
In addition to included general post-operative instructions for Debridement of Extensive Tissue/Tumor Necrosis,  Diet: Resume home heart healthy diet.   Activity: Light activity and walking are encouraged. Do not drive or drink alcohol if taking narcotic pain medications.  Wound care: Change Kerlix (roll) gauze packing at least once every day AND as needed if saturated. You may shower between removal of packing and replacement, but no baths or submerging incision underwater until follow-up.   Medications: Resume all home medications. For mild to moderate pain: acetaminophen (Tylenol) or ibuprofen/naproxen (if no kidney disease). Combining Tylenol with alcohol can substantially increase your risk of causing liver disease. Narcotic pain medications, if prescribed, can be used for severe pain, though may cause nausea, constipation, and drowsiness. Do not combine Tylenol and Percocet (or similar) within a 6 hour period as Percocet (and similar) contain(s) Tylenol. If you do not need the narcotic pain medication, you do not need to fill the prescription.  Call office [phone]) at any time if any questions, worsening pain, fevers/chills, bleeding, drainage from incision site, or other concerns.   Surgical Wound Debridement, Care After Refer to this sheet in the next few weeks. These instructions provide you with information about caring for yourself after your procedure. Your health care provider may also give you more specific instructions. Your treatment has been planned according to current medical practices, but problems sometimes occur. Call your health care provider if you have any problems or questions after your procedure. What can I expect after the procedure? After the procedure, it is common to have:  Pain or soreness.  Fluid that leaks from the wound.  Stiffness.  Follow these instructions at home: Medicines  Take over-the-counter and prescription medicines only as told by your  health care provider.  If you were prescribed an antibiotic medicine, take it or apply it as told by your health care provider. Do not stop taking or using the antibiotic even if your condition improves. Wound care  Follow instructions from your health care provider about: ? How to take care of your wound. ? When and how you should change your dressing. ? When you should remove your dressing. If your dressing is dry and stuck when you try to remove it, moisten or wet the dressing with saline or water so that it can be removed without harming your skin or wound tissue.  Check your wound every day for signs of infection. Have a caregiver do this for you if you are not able. Watch for: ? More redness, swelling, or pain. ? More fluid, blood, or pus. ? A bad smell. Activity   Do not drive or operate heavy machinery while taking prescription pain medicine.  Ask your health care provider what activities are safe for you. General instructions  Eat a healthy diet with lots of protein. Ask your health care provider to suggest the best diet for you.  Do not smoke. Smoking makes it harder for your body to heal.  Keep all follow-up visits as told by your health care provider. This is important.  Do not take baths, swim, or use a hot tub until your health care provider approves. Contact a health care provider if:  You have a fever.  Your pain medicine is not helping.  Your wound is red and swollen.  You have increased bleeding.  You have pus coming from your wound.  You have a bad smell coming from your wound.  Your wound is not getting better after 1-2 weeks of  treatment.  You develop a new medical condition, such as diabetes, peripheral vascular disease, or conditions that affect your defense (immune) system. This information is not intended to replace advice given to you by your health care provider. Make sure you discuss any questions you have with your health care  provider. Document Released: 09/22/2012 Document Revised: 03/12/2016 Document Reviewed: 02/14/2015 Elsevier Interactive Patient Education  2018 [facility] has been arranged for you.

## 2018-09-24 NOTE — Transfer of Care (Signed)
Immediate Anesthesia Transfer of Care Note  Patient: Louis Davies  Procedure(s) Performed: LEFT AXILLARY EXCISIONAL DEBRIDEMENT OF NECROTIC TISSUE (Left )  Patient Location: PACU  Anesthesia Type:General  Level of Consciousness: sedated  Airway & Oxygen Therapy: Patient Spontanous Breathing and Patient connected to face mask oxygen  Post-op Assessment: Report given to RN and Post -op Vital signs reviewed and stable  Post vital signs: Reviewed and stable  Last Vitals:  Vitals Value Taken Time  BP 97/58 09/24/2018  9:23 AM  Temp    Pulse 63 09/24/2018  9:23 AM  Resp 19 09/24/2018  9:23 AM  SpO2 100 % 09/24/2018  9:23 AM  Vitals shown include unvalidated device data.  Last Pain:  Vitals:   09/24/18 0715  TempSrc: Temporal  PainSc:          Complications: No apparent anesthesia complications

## 2018-09-24 NOTE — Anesthesia Preprocedure Evaluation (Addendum)
Anesthesia Evaluation    History of Anesthesia Complications (+) PONV and history of anesthetic complications  Airway Mallampati: II       Dental   Pulmonary neg sleep apnea, neg COPD,           Cardiovascular (-) hypertension(-) Past MI and (-) CHF (-) dysrhythmias (-) Valvular Problems/Murmurs     Neuro/Psych neg Seizures    GI/Hepatic Neg liver ROS, neg GERD  ,  Endo/Other  neg diabetes  Renal/GU negative Renal ROS     Musculoskeletal   Abdominal   Peds  Hematology  (+) anemia ,   Anesthesia Other Findings   Reproductive/Obstetrics                           Anesthesia Physical Anesthesia Plan  ASA: III  Anesthesia Plan: General   Post-op Pain Management:    Induction: Intravenous  PONV Risk Score and Plan: 3 and TIVA, Propofol infusion, Ondansetron and Scopolamine patch - Pre-op  Airway Management Planned: LMA  Additional Equipment:   Intra-op Plan:   Post-operative Plan:   Informed Consent: I have reviewed the patients History and Physical, chart, labs and discussed the procedure including the risks, benefits and alternatives for the proposed anesthesia with the patient or authorized representative who has indicated his/her understanding and acceptance.     Plan Discussed with:   Anesthesia Plan Comments:         Anesthesia Quick Evaluation

## 2018-09-25 ENCOUNTER — Encounter: Payer: Self-pay | Admitting: Surgery

## 2018-09-25 DIAGNOSIS — C773 Secondary and unspecified malignant neoplasm of axilla and upper limb lymph nodes: Secondary | ICD-10-CM | POA: Diagnosis not present

## 2018-09-25 DIAGNOSIS — S41102A Unspecified open wound of left upper arm, initial encounter: Secondary | ICD-10-CM | POA: Diagnosis not present

## 2018-09-25 DIAGNOSIS — I96 Gangrene, not elsewhere classified: Secondary | ICD-10-CM | POA: Diagnosis not present

## 2018-09-25 DIAGNOSIS — R222 Localized swelling, mass and lump, trunk: Secondary | ICD-10-CM | POA: Diagnosis not present

## 2018-09-25 DIAGNOSIS — C439 Malignant melanoma of skin, unspecified: Secondary | ICD-10-CM | POA: Diagnosis not present

## 2018-09-25 DIAGNOSIS — L98493 Non-pressure chronic ulcer of skin of other sites with necrosis of muscle: Secondary | ICD-10-CM | POA: Diagnosis not present

## 2018-09-25 DIAGNOSIS — I959 Hypotension, unspecified: Secondary | ICD-10-CM | POA: Diagnosis not present

## 2018-09-25 LAB — BPAM RBC
Blood Product Expiration Date: 201912082359
Blood Product Expiration Date: 201912202359
ISSUE DATE / TIME: 201912060814
ISSUE DATE / TIME: 201912061108
Unit Type and Rh: 1700
Unit Type and Rh: 9500

## 2018-09-25 LAB — CBC
HCT: 28.4 % — ABNORMAL LOW (ref 39.0–52.0)
Hemoglobin: 8.6 g/dL — ABNORMAL LOW (ref 13.0–17.0)
MCH: 22.3 pg — ABNORMAL LOW (ref 26.0–34.0)
MCHC: 30.3 g/dL (ref 30.0–36.0)
MCV: 73.8 fL — AB (ref 80.0–100.0)
PLATELETS: 310 10*3/uL (ref 150–400)
RBC: 3.85 MIL/uL — ABNORMAL LOW (ref 4.22–5.81)
RDW: 18.6 % — ABNORMAL HIGH (ref 11.5–15.5)
WBC: 15 10*3/uL — ABNORMAL HIGH (ref 4.0–10.5)
nRBC: 0 % (ref 0.0–0.2)

## 2018-09-25 LAB — BASIC METABOLIC PANEL
Anion gap: 7 (ref 5–15)
BUN: 13 mg/dL (ref 8–23)
CALCIUM: 7.8 mg/dL — AB (ref 8.9–10.3)
CO2: 25 mmol/L (ref 22–32)
Chloride: 106 mmol/L (ref 98–111)
Creatinine, Ser: 0.75 mg/dL (ref 0.61–1.24)
GFR calc Af Amer: >60 mL/min (ref >60)
GFR calc non Af Amer: >60 mL/min (ref >60)
Glucose, Bld: 124 mg/dL — ABNORMAL HIGH (ref 70–99)
Potassium: 3.9 mmol/L (ref 3.5–5.1)
Sodium: 138 mmol/L (ref 135–145)

## 2018-09-25 LAB — TYPE AND SCREEN
ABO/RH(D): B NEG
ANTIBODY SCREEN: NEGATIVE
Unit division: 0
Unit division: 0

## 2018-09-25 MED ORDER — DOCUSATE SODIUM 100 MG PO CAPS: 100.0000 mg | ORAL_CAPSULE | Freq: Two times a day (BID) | ORAL | 0 refills | Status: AC | PRN

## 2018-09-25 NOTE — Care Management Note (Signed)
Case Management Note  Patient Details  Name: Louis Davies MRN: [id number] Date of Birth: Mar 16, 1951  Subjective/Objective:  Patient to be discharged per MD order. Orders in place for home health services. Patient will need dressing changes at home. CMS Medicare.gov Compare Post Acute Care list reviewed with patient. Patient prefers [facility] for home health. Referral placed with [place] for RN and aide services. Patient to go to friends house I have provided [facility] with the updated address.                      Action/Plan:   Expected Discharge Date:  09/25/18               Expected Discharge Plan:  [place]  In-House Referral:     Discharge planning Services  CM Consult  Post Acute Care Choice:  Home Health Choice offered to:  Patient  DME Arranged:    DME Agency:     HH Arranged:  RN, Nurse's Aide [place] Agency:  [facility]  Status of Service:  Completed, signed off  If discussed at [place] of Stay Meetings, dates discussed:    Additional Comments:  Latanya Maudlin, RN 09/25/2018, 12:06 PM

## 2018-09-25 NOTE — Discharge Summary (Signed)
Physician Discharge Summary  Patient ID: GRANVIL DJORDJEVIC MRN: [id number] DOB/AGE: March 26, 1951 67 y.o.  Admit date: 09/24/2018 Discharge date: 09/25/2018  Admission Diagnoses: open left axillary wound  Discharge Diagnoses:  Same as above  Discharged Condition: good  Hospital Course: Admitted for obs s/p debridement of open left axillary wound.  Postop, noted to be less responsive with labile BP, so admitted to obs, s/p 2u pRBC.  Hgb level stable, A&Ox3 and comfortable at time of discharge, wound looked clean with no continuous bleeding or purulent discharge noted.    Consults: None  Discharge Exam: Blood pressure 130/69, pulse (!) 55, temperature 97.7 F (36.5 C), temperature source Oral, resp. rate 20, SpO2 100 %. General appearance: alert, cooperative and no distress Incision/Wound: left axillary wound with serosanguinous discharge, packing removed with minimal bleeding noted.  no purulent discharge.  repacked with kerlex roll.  Tolerated change well with only oral pain meds.    Disposition:  Discharge disposition: 01-Home or Self Care       Discharge Instructions    Discharge patient   Complete by:  As directed    Discharge disposition:  01-Home or Self Care   Discharge patient date:  09/25/2018     Allergies as of 09/25/2018   No Known Allergies     Medication List    TAKE these medications   docusate sodium 100 MG capsule Commonly known as:  COLACE Take 1 capsule (100 mg total) by mouth 2 (two) times daily as needed for up to 10 days for mild constipation.   multivitamin tablet Take 1 tablet by mouth daily.   Oxycodone HCl 10 MG Tabs Take 1 tablet (10 mg total) by mouth every 6 (six) hours as needed. What changed:    when to take this  reasons to take this   ZELBORAF 240 MG tablet Generic drug:  vemurafenib TAKE 3 TABLETS (720 MG TOTAL) BY MOUTH EVERY 12 HOURS. What changed:  See the new instructions.      Follow-up Information    Vickie Epley, MD. Schedule an appointment as soon as possible for a visit in 1 week(s).   Specialty:  General Surgery Contact information: [address] [place] [address] [phone]            Total time spent arranging discharge was >32min. Signed: Benjamine [name] 09/25/2018, 9:53 AM

## 2018-09-25 NOTE — Progress Notes (Signed)
Discharge instructions and prescriptions were reviewed with patient and his significant other as Mr. Geffre will be staying at her home for a few days. The dressing and packing was changed in order to demonstrate how it is to be done. Questions were encouraged and answered. Home health has been arranged. Will call for wheelchair when patient is dressed and ready.

## 2018-09-27 LAB — HIV ANTIBODY (ROUTINE TESTING W REFLEX): HIV Screen 4th Generation wRfx: NONREACTIVE

## 2018-09-28 ENCOUNTER — Telehealth: Payer: Self-pay | Admitting: *Deleted

## 2018-09-28 NOTE — Telephone Encounter (Signed)
Patients friend called today to request prescription for dressing change supplies. Patient was referred to home health but per friend patients insurance does not cover services. Patient is scheduled to follow up with Dr. Rosana Hoes on Thursday 12/12, encouraged friend to give [place] a call back on Thursday when we know more about what supplies will be needed. I have spoken to [employer], they state patient will have to talk directly to insurance company regarding coverage of supplies. Prescription for supplies can be sent if Medicare will cover.

## 2018-09-29 ENCOUNTER — Encounter: Payer: Medicare Other | Admitting: Occupational Therapy

## 2018-09-30 ENCOUNTER — Ambulatory Visit (INDEPENDENT_AMBULATORY_CARE_PROVIDER_SITE_OTHER): Payer: Medicare Other | Admitting: Surgery

## 2018-09-30 ENCOUNTER — Encounter: Payer: Self-pay | Admitting: Surgery

## 2018-09-30 ENCOUNTER — Ambulatory Visit: Payer: Medicare Other | Attending: Oncology | Admitting: Occupational Therapy

## 2018-09-30 ENCOUNTER — Other Ambulatory Visit: Payer: Self-pay

## 2018-09-30 ENCOUNTER — Other Ambulatory Visit: Payer: Self-pay | Admitting: *Deleted

## 2018-09-30 VITALS — BP 135/75 | HR 85 | Temp 97.7°F | Resp 14 | Ht 71.0 in | Wt 163.0 lb

## 2018-09-30 DIAGNOSIS — Z4889 Encounter for other specified surgical aftercare: Secondary | ICD-10-CM

## 2018-09-30 DIAGNOSIS — I890 Lymphedema, not elsewhere classified: Secondary | ICD-10-CM | POA: Diagnosis not present

## 2018-09-30 MED ORDER — OXYCODONE HCL 10 MG PO TABS: 10.0000 mg | ORAL_TABLET | Freq: Four times a day (QID) | ORAL | 0 refills | Status: DC | PRN

## 2018-09-30 NOTE — Patient Instructions (Addendum)
Return in one week. The patient is aware to call back for any questions or concerns. 

## 2018-09-30 NOTE — Telephone Encounter (Signed)
[name] requests that Louis Davies call her when she returns to office regarding materials needed for [name] to order

## 2018-09-30 NOTE — Therapy (Signed)
[place] MAIN [facility] SERVICES [address] [place], [place], [address] Phone: [phone]   Fax:  [phone]  Occupational Therapy Re-Evaluation and Treatment Note: Lymphedema Care  Patient Details  Name: Louis Davies MRN: [id number] Date of Birth: [birth year] No data recorded  Encounter Date: 09/30/2018  OT End of Session - 09/30/18 1702    Visit Number  24    Number of Visits  36    Date for OT Re-Evaluation  09/27/18    OT Start Time  1100    OT Stop Time  1215    OT Time Calculation (min)  75 min    Activity Tolerance  Patient tolerated treatment well;Patient limited by pain;Treatment limited secondary to medical complications (Comment)    Behavior During Therapy  [facility] for tasks assessed/performed       Past Medical History:  Diagnosis Date  . Anemia   . Arthritis   . Cancer ([place])    MELANOMA  . Complication of anesthesia   . Dyspnea    doe  . Perforated viscus   . PONV (postoperative nausea and vomiting)     Past Surgical History:  Procedure Laterality Date  . AXILLARY LYMPH NODE DISSECTION Left 06/26/2017   Procedure: EXCISION OF LEFT AXILLARY MASS;  Surgeon: Johnathan Hausen, MD;  Location: [facility] ORS;  Service: General;  Laterality: Left;  . DIAGNOSTIC LAPAROSCOPY    . EMBOLIZATION Left 05/11/2018   Procedure: EMBOLIZATION;  Surgeon: Katha Cabal, MD;  Location: [place] CV LAB;  Service: Cardiovascular;  Laterality: Left;  . LAPAROTOMY N/A 10/14/2017   Procedure: EXPLORATORY LAPAROTOMY;  Surgeon: Clayburn Pert, MD;  Location: [facility] ORS;  Service: General;  Laterality: N/A;  . VASECTOMY    . WOUND DEBRIDEMENT Left 09/24/2018   Procedure: LEFT AXILLARY EXCISIONAL DEBRIDEMENT OF NECROTIC TISSUE;  Surgeon: Vickie Epley, MD;  Location: [facility] ORS;  Service: General;  Laterality: Left;    There were no vitals filed for this visit.  Subjective Assessment - 09/30/18 1649    Subjective   Louis Davies  returns to Occupational Therapy to day for treatment visit 24/36 to address LUE/ LUQ lymphedema following L axillary excisional debridement of necrotic tissue by Priscille Heidelberg, MD on 09/24/2018. Louis Davies states that his significant other is assisting with wound care at home as instructed by RN at DC after overnight in hospital.  Pt denies pain at rest today, but demonstrates grimmacing and crying out with all bed mobility, transfers from supine <-> sit, and when dressing upper body. Pt [place] he [place] to [place] without difficulty.    Limitations   LUE/ LUQ pain, swelling and odor from wound contribute to impaired basic and instrumental ADLs, limited participation in leisure pursuits and  work/ productive activities, impaired social participation, impaired role performance, impaired body image     Currently in Pain?  No/denies    Pain Score  --   see SUBJECTIVE. Not numerically rated today   Pain Location  Axilla    Pain Orientation  Left    Pain Descriptors / Indicators  Guarding;Tender;Sore;Tightness;Operative site guarding;Heaviness;Discomfort;Squeezing;Tiring;Grimacing    Pain Type  Surgical pain;Chronic pain    Pain Frequency  Intermittent    Aggravating Factors   repositioning, transfers, UB dressing, L shoulder AROM above 750 degrees flexion and abduction                            OT  Education - 09/30/18 1659    Education Details  Reviewed functional lymphatic anatomy and alternative MLD pathways using adjacent anastomoses. Taught UE/UQ lymphatic pumping ther-ex ( 10 reps each in sequence, bilaterally, 2 x daily) Pt demonstrated correct techniques for all. Discussed any known precautions s/p surgery., aka limited shoulder flexion and abduction greater than 90 degres Pt will discuss with surgeon at this afternoon's follow up.    Person(s) Educated  Patient    Methods  Explanation;Demonstration;Handout;Tactile cues;Verbal cues    Comprehension  Verbalized  understanding;Need further instruction;Returned demonstration;Verbal cues required;Tactile cues required          OT Long Term Goals - 09/30/18 1720      OT LONG TERM GOAL #1   Title  Pt will be able to apply multi-layer short stretch compression wraps using correct gradient techniques with independently , but with extra time  to achieve optimal limb volume reduction during Intensive Phase CDT, and to return affected limb(s) , as closely as possible, to premorbid size and shape.    Baseline  Dependent    Time  2    Period  Days    Status  Achieved      OT LONG TERM GOAL #2   Title  Pt will demonstrate understanding of lymphedema (LE) precautions / prevention principals, including signs / symptoms of cellulitis infection with modified independence using LE Workbook as printed reference to identify 6 precautions without verbal cues by end of 3rd  OT Rx visit.     Baseline  no knowledge    Time  12    Period  Weeks    Status  On-going    Target Date  12/29/18      OT LONG TERM GOAL #3   Title  Pt to achieve no less than 10% limb volume reduction in affected limb(s) during Intensive Phase CDT to control limb swelling, to improve tissue integrity and immune function, to improve ADLs performance and to improve functional mobility/ transfers.    Baseline  dependent    Time  12    Period  Weeks    Status  On-going    Target Date  12/29/18      OT LONG TERM GOAL #4   Title  Pt will achieve 100% compliance with daily LE self-care home program components, including proper skin care, simple self-MLD, gradient compression wraps/ garments, and therapeutic exercise to ensure optimal Intensive Phase limb volume reduction to expedite compression garment/ device fitting.    Baseline  Max A    Time  12    Period  Weeks    Status  On-going    Target Date  12/29/18      OT LONG TERM GOAL #5   Title  Pt will demonstrate competent use of assistive devices during LE self-care training to achieve  pain free dressing, bathing and grooming .    Baseline  Max A    Time  12    Period  Weeks    Status  Revised    Target Date  12/29/18      OT LONG TERM GOAL #6   Title  Pt will retain optimal limb volume reductions achieved during Intensive Phase CDT with no more than 3% volume increase  without CG assistance to limit LE progression and further functional decline.    Baseline  Dependent    Time  6    Period  Months    Status  On-going  Plan - 09/30/18 1702    Clinical Impression Statement  Louis Davies presents today with increased post surgical swelling throughout the LUE and quadrant. L shoulder AROM in flexion and abduction are limited to 75 degrees with pain at end ranges. Skin  anterior to axilla is reddened , dense and mildly warm to palpation. Pt presents with  further functional decline in transfers and functional mobility) in basic ADLs ( upper and lower body dressing and bathing, impaired sleep, inability to perform most instrumental ADLs requiring lifting, carrying, and reaching, and Pt is unable to work at present . Reviewed    goals of therapy and discussed plan of care going forward. Pt agrees with recommendation to resume MLD at 3 x weekly frequency for 12 weeks with  goal of reducing limb and trunkal swelling, lowering infection risk, facilitating improved wound healing, and supporting immune response. Provided very gentle LUE/LUE MLD in supine only today to limit painful bed mobility. Utilized short neck sequence, modeled diaphragmatic breathing while Pt performed to activate deep abdominal lymphatics, and uttilized anterior and posterior anastomoses, axillary inguinal anastomosis, and intercostal pathways. Pt able to perform lymphatic pumping therex with mod A after skilled teaching. Offered to provide simple self MLD training for significant other. We discussed caregiver burden of care, and Pt will consider.. Plan: Increase OT frequency for lymphedema care to 3 x  weekly. Monitor closly for infection . Encourage full time daily use of compression sleeve and glove and convoluted foam HOS device.    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    OT Frequency  3x / week    OT Duration  12 weeks   and PRN   OT Treatment/Interventions  Self-care/ADL training;Therapeutic exercise;Manual lymph drainage;Compression bandaging;Patient/family education;Other (comment);Energy conservation;Therapeutic activities;Balance training;DME and/or AE instruction;Manual Therapy;Passive range of motion   Assist Pt with modification of TShirts with velcro cosures at shoulder and side seams to reduce pain     when dressing. Provide additional assistive device resources PRN.   Clinical Decision Making  Multiple treatment options, significant modification of task necessary    Recommended Other Services  fit with custom, clat knit LUE compression sleeve and HOS device. Consider seamless compression glove and vest. Consider kinesiotape during Intensive Phase    Consulted and Agree with Plan of Care  Patient       Patient will benefit from skilled therapeutic intervention in order to improve the following deficits and impairments:  Decreased endurance, Decreased mobility, Decreased skin integrity, Impaired sensation, Decreased knowledge of precautions, Decreased range of motion, Decreased scar mobility, Increased edema, Pain, Decreased activity tolerance, Decreased knowledge of use of DME, Decreased  strength, Impaired flexibility, Impaired UE functional use, Decreased psychosocial skills, Decreased balance, Other (comment)(balance and gait negatively impacted by guarding  posture , impaired armswing and body asymmetry)  Visit Diagnosis: Lymphedema, not elsewhere classified - Plan: Ot plan of care cert/re-cert    Problem List Patient Active Problem List   Diagnosis Date Noted  . Open wound of left axillary region 09/24/2018  . Anemia 05/10/2018  . Ileus ([place])   . Constipation 10/10/2017  .  Metastatic melanoma ([place]) 07/07/2017  . Axillary mass, left 06/26/2017  . Chronic pain of left knee 12/14/2015  . Influenza vaccination declined 11/27/2015    Louis Spearman, MS, OTR/L, [facility] 09/30/18 5:27 PM  [place] MAIN [facility] SERVICES [address] [place], [place], [address] Phone: [phone]   Fax:  [phone]  Name: Louis Davies MRN: [id number] Date of Birth: 24-Aug-1951

## 2018-09-30 NOTE — Progress Notes (Signed)
Surgical Clinic Progress/Follow-up Note   HPI:  67 y.o. Male presents to clinic for post-op follow-up 1 week s/p excision of large necrotic Left axillary mass (8 cm deep x 8.5 cm A-P x 7 cm wide) and sharp excisional debridement ofLeft axillary soft tissue/tumornecrosis Rosana Hoes, 09/24/2018). Patient reports his pre- and peri-operative pain continues to improve, though remains "sore". In contrast to pre-surgery, patient says he is now able to rest his arm at his side more comfortably. His wife has been changing his dressing/packing twice daily with only a small amount of packing (not as instructed), because patient was told he could only have either home health or lymphedema clinic and chose the latter. He and his wife report the pre-operative smell was better initially after surgery and remains better than pre-surgery, but is somewhat more than it was initially post-surgery. Since patient and his wife continue to express dissatisfaction with wound clinic, they have not returned to wound clinic either and say they are looking [place] other options in [place]. He otherwise denies bleeding, fever/chills, N/V, CP, or SOB.  Review of Systems:  Constitutional: denies fever/chills  Respiratory: denies shortness of breath, wheezing  Cardiovascular: denies chest pain, palpitations  Gastrointestinal: denies abdominal pain, N/V, or diarrhea Skin: Denies any other rashes or skin discolorations except post-surgical wounds as per interval history  Vital Signs:  BP 135/75   Pulse 85   Temp 97.7 F (36.5 C) (Skin)   Resp 14   Ht 5\' 11"  (1.803 m)   Wt 163 lb (73.9 kg)   SpO2 98%   BMI 22.73 kg/m    Physical Exam:  Constitutional:  -- Normal body habitus  -- Awake, alert, and oriented x3  Pulmonary:  -- No crackles -- Equal breath sounds bilaterally -- Breathing non-labored at rest Cardiovascular:  -- S1, S2 present  -- No pericardial rubs  Musculoskeletal / Integumentary:  -- Wounds  or skin discoloration: Moist wound bed with thin layer of malodorous necrotic film over edematous friable underlying tissue -- Extremities: B/L UE and LE FROM, hands and feet warm, chronic LUE lymphedema with compression stocking in place without glove (during visit)   Imaging: No new pertinent imaging available for review  Assessment:  67 y.o. yo Male with a problem list including...  Patient Active Problem List   Diagnosis Date Noted  . Open wound of left axillary region 09/24/2018  . Anemia 05/10/2018  . Ileus ([place])   . Constipation 10/10/2017  . 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 Axillary mass, left 06/26/2017  . Chronic pain of left knee 12/14/2015  . Influenza vaccination declined 11/27/2015    presents to clinic for post-op follow-up evaluation, doing okay despite inadequate/suboptimal post-surgical wound care 1 week s/p excision of large necrotic Left axillary mass (8 cm deep x 8.5 cm A-P x 7 cm wide) and sharp excisional debridement ofLeft axillary soft tissue/tumornecrosis Rosana Hoes, 09/24/2018) in the context of metastatic melanoma with bulky Left axillary mass(es) s/p coil embolization during the past few months.  Plan:              - bedside superficial mechanical debridement performed using 4x4" dry gauze following oral pre-medication  - demonstrated appropriate packing with nearly entire dry Kerlix gauze roll to be changed twice daily              - will benefit from 1 tablet pain medication prior to dressing changes and 2 tablets prior to office appointment (for now)  - since chronic pain control  thus far managed by oncologist, will defer from complicating management with additional narcotic prescription, but can prescribe for dressing changes if needed             - considering ongoing necrosis and questions/difficulties with wound care, return to clinic next week             - instructed to call office if any questions or concerns  All of the above recommendations  were discussed with the patient and patient's wife, and all of patient's and family's questions were answered to their expressed satisfaction.  -- Marilynne Drivers Rosana Hoes, MD, [place]: [facility] - Partnering for exceptional care. Office: [phone]

## 2018-10-03 NOTE — Progress Notes (Signed)
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  INTERVAL HISTORY: Patient returns to clinic for further evaluation and consideration of reinitiating Zelboraf.  He recently underwent surgical debridement of his left axillary lesion, but patient's girlfriends states mass has been growing over the past week after his surgery.  Patient continues to have chronic weakness and fatigue.  He continues to have pain, but states this has improved since his recent surgery.  He has no neurologic complaints. He denies any recent fevers or illnesses. He has no chest pain, cough, or hemoptysis. He denies any nausea, vomiting, constipation, or diarrhea. He has no melena or hematochezia. He has no urinary complaints.  Patient otherwise feels well and offers no further specific complaints.  REVIEW OF SYSTEMS:   Review of Systems  Constitutional: Positive for malaise/fatigue. Negative for fever and weight loss.  Respiratory: Negative.  Negative for cough and shortness of breath.   Cardiovascular: Negative.  Negative for chest pain and leg swelling.  Gastrointestinal: Negative.  Negative for abdominal pain, blood in stool, melena, nausea and vomiting.  Genitourinary: Negative.  Negative for dysuria.  Musculoskeletal: Negative.  Negative for myalgias.  Skin: Negative.  Negative for rash.  Neurological: Positive for weakness. Negative for sensory change and focal weakness.  Psychiatric/Behavioral: Negative.  The patient is not nervous/anxious.     As per HPI. Otherwise, a complete review of systems is negative.  PAST MEDICAL HISTORY: Past Medical History:  Diagnosis Date  . Anemia   . Arthritis   . Cancer ([place])    MELANOMA  . Complication of anesthesia     . Dyspnea    doe  . Perforated viscus   . PONV (postoperative nausea and vomiting)     PAST SURGICAL HISTORY: Past Surgical History:  Procedure Laterality Date  . AXILLARY LYMPH NODE DISSECTION Left 06/26/2017   Procedure: EXCISION OF LEFT AXILLARY MASS;  Surgeon: Johnathan Hausen, MD;  Location: [facility] ORS;  Service: General;  Laterality: Left;  . DIAGNOSTIC LAPAROSCOPY    . EMBOLIZATION Left 05/11/2018   Procedure: EMBOLIZATION;  Surgeon: Katha Cabal, MD;  Location: [place] [facility];  Service: Cardiovascular;  Laterality: Left;  . LAPAROTOMY N/A 10/14/2017   Procedure: EXPLORATORY LAPAROTOMY;  Surgeon: Clayburn Pert, MD;  Location: [facility] ORS;  Service: General;  Laterality: N/A;  . VASECTOMY    . WOUND DEBRIDEMENT Left 09/24/2018   Procedure: LEFT AXILLARY EXCISIONAL DEBRIDEMENT OF NECROTIC TISSUE;  Surgeon: Vickie Epley, MD;  Location: [facility] ORS;  Service: General;  Laterality: Left;    FAMILY HISTORY: Family History  Problem Relation Age of Onset  . Diabetes Mother   . Hypertension Mother   . Leukemia Father   . Thyroid disease Sister   . Liver disease Maternal Grandmother   . Heart Problems Maternal Grandfather   . Diabetes Maternal Grandfather   . Lung cancer Paternal Grandmother   . Prostate cancer Paternal Grandfather     ADVANCED DIRECTIVES (Y/N):  N  HEALTH MAINTENANCE: Social History   Tobacco Use  . Smoking status: Never Smoker  . Smokeless tobacco: Never Used  Substance Use Topics  . Alcohol use: No  . Drug use: No     Colonoscopy:  PAP:  Bone density:  Lipid panel:  No Known Allergies  Current Outpatient  Medications  Medication Sig Dispense Refill  . ibuprofen (ADVIL,MOTRIN) 200 MG tablet Take 200 mg by mouth every 6 (six) hours as needed.    . Multiple Vitamin (MULTIVITAMIN) tablet Take 1 tablet by mouth daily.    . Oxycodone HCl 10 MG TABS Take 1 tablet (10 mg total) by mouth every 6 (six) hours as needed. 60 tablet 0  . ZELBORAF  240 MG tablet TAKE 3 TABLETS (720 MG TOTAL) BY MOUTH EVERY 12 HOURS. (Patient taking differently: Take 720 mg by mouth every 12 (twelve) hours. ) 180 tablet 2   No current facility-administered medications for this visit.     OBJECTIVE: Vitals:   10/06/18 1012  BP: 119/64  Pulse: 64  Temp: 97.6 F (36.4 C)     Body mass index is 22.3 kg/m.    ECOG FS:2 - Symptomatic, <50% confined to bed  General: Well-developed, well-nourished, no acute distress. Eyes: Pink conjunctiva, anicteric sclera. HEENT: Normocephalic, moist mucous membranes, clear oropharnyx. Lungs: Clear to auscultation bilaterally. Heart: Regular rate and rhythm. No rubs, murmurs, or gallops. Abdomen: Soft, nontender, nondistended. No organomegaly noted, normoactive bowel sounds. Musculoskeletal: No edema, cyanosis, or clubbing. Neuro: Alert, answering all questions appropriately. Cranial nerves grossly intact. Skin: No rashes or petechiae noted. Psych: Normal affect. Lymphatics: Large open wound in left axilla with increasing mass over the last week.    LAB RESULTS:  Lab Results  Component Value Date   NA 141 10/06/2018   K 3.6 10/06/2018   CL 104 10/06/2018   CO2 26 10/06/2018   GLUCOSE 112 (H) 10/06/2018   BUN 18 10/06/2018   CREATININE 0.86 10/06/2018   CALCIUM 8.6 (L) 10/06/2018   PROT 6.6 10/06/2018   ALBUMIN 3.0 (L) 10/06/2018   AST 15 10/06/2018   ALT 13 10/06/2018   ALKPHOS 43 10/06/2018   BILITOT 0.4 10/06/2018   GFRNONAA >60 10/06/2018   GFRAA >60 10/06/2018    Lab Results  Component Value Date   WBC 6.4 10/06/2018   NEUTROABS 5.2 10/06/2018   HGB 9.2 (L) 10/06/2018   HCT 31.4 (L) 10/06/2018   MCV 73.9 (L) 10/06/2018   PLT 336 10/06/2018     STUDIES: No results found.  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  PLAN:    1. 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  Despite excellent debridement of  patient's axillary wound, it appears to have increased in size over the past week concerning for a rapid recurrence of malignancy.  Case discussed with surgery who could possibly do a re-debridement but it would not be an oncologic surgery.  Given the size of the wound, complete healing is unlikely therefore patient has been instructed to reinitiate 720 mg Zelboraf twice per day.  Patient will have a PET scan on October 22, 2017.  He also has a second opinion at [facility] on October 25, 2017.  Patient will follow-up after his imaging and second opinion for further evaluation and additional treatment options.   2.  Left axilla: Appreciate surgical input.  Reinitiate Zelboraf as above.   3.  Left arm swelling: Significantly improved.  Continue follow-up with lymphedema clinic. 4.  Poor appetite: Improved.  Continue Megace as prescribed.  Appreciate dietary input. 5.  Anemia: Patient's hemoglobin is decreased, but stable at 8.6.  Monitor.    I spent a total of 30 minutes face-to-face with the patient of which greater than 50% of the visit was spent in counseling and  coordination of care as detailed above.  Patient expressed understanding and was in agreement with this plan. He also understands that He can call clinic at any time with any questions, concerns, or complaints.   Cancer Staging Metastatic melanoma [facility]) Staging form: Melanoma of the Skin, AJCC 8th Edition - Clinical stage from 07/15/2017: Stage IV (cTX, cN1, cM1c(0)) - Signed by Lloyd Huger, MD on 07/15/2017   Lloyd Huger, MD   10/08/2018 2:06 PM

## 2018-10-04 ENCOUNTER — Telehealth: Payer: Self-pay | Admitting: *Deleted

## 2018-10-04 ENCOUNTER — Telehealth: Payer: Self-pay

## 2018-10-04 ENCOUNTER — Encounter: Payer: Medicare Other | Admitting: Occupational Therapy

## 2018-10-04 ENCOUNTER — Ambulatory Visit: Payer: Medicare Other | Admitting: Occupational Therapy

## 2018-10-04 DIAGNOSIS — I890 Lymphedema, not elsewhere classified: Secondary | ICD-10-CM | POA: Diagnosis not present

## 2018-10-04 NOTE — Telephone Encounter (Signed)
Call placed to inform patient and friend that we have sent message to Dr. Rosana Hoes office regarding need for prescription for dressing change supplies. Dr. Rosana Hoes is following patient for wound care at the time and is making wound care/dressing recommendations.

## 2018-10-04 NOTE — Telephone Encounter (Signed)
Patient's friend contacted our office to ask what type of supplies patient was needing for wound care. I sent Dr. Rosana Hoes a staff message letting him know that his office is needing to reach out to the patient or friend for what he is needing for wound care.

## 2018-10-04 NOTE — Therapy (Signed)
[place] MAIN [facility] SERVICES [address] [place], [place], [address] Phone: [phone]   Fax:  [phone]  Occupational Therapy Treatment Note and Re-certification Plan  Of Care:  Lymphedema Care  Patient Details  Name: Louis Davies MRN: [id number] Date of Birth: 01/15/1951 No data recorded  Encounter Date: 10/04/2018  OT End of Session - 10/04/18 1459    Visit Number  25    Number of Visits  36    Date for OT Re-Evaluation  01/02/19    Authorization Type  initial cert ended 90/2/40. initiated . NEW CERT submitted on 97/35/3299    Authorization Time Period  cont OT 2-3 x weekly x 12 weeks/ 36 visits to address LUE/LUQ secondary lymphedema and to facilitate wound healing     OT Start Time  0905    OT Stop Time  1010    OT Time Calculation (min)  65 min    Activity Tolerance  Patient tolerated treatment well;Patient limited by pain;Treatment limited secondary to medical complications (Comment)    Behavior During Therapy  [facility] for tasks assessed/performed       Past Medical History:  Diagnosis Date  . Anemia   . Arthritis   . Cancer ([place])    MELANOMA  . Complication of anesthesia   . Dyspnea    doe  . Perforated viscus   . PONV (postoperative nausea and vomiting)     Past Surgical History:  Procedure Laterality Date  . AXILLARY LYMPH NODE DISSECTION Left 06/26/2017   Procedure: EXCISION OF LEFT AXILLARY MASS;  Surgeon: Johnathan Hausen, MD;  Location: [facility] ORS;  Service: General;  Laterality: Left;  . DIAGNOSTIC LAPAROSCOPY    . EMBOLIZATION Left 05/11/2018   Procedure: EMBOLIZATION;  Surgeon: Katha Cabal, MD;  Location: [place] [facility];  Service: Cardiovascular;  Laterality: Left;  . LAPAROTOMY N/A 10/14/2017   Procedure: EXPLORATORY LAPAROTOMY;  Surgeon: Clayburn Pert, MD;  Location: [facility] ORS;  Service: General;  Laterality: N/A;  . VASECTOMY    . WOUND DEBRIDEMENT Left 09/24/2018   Procedure: LEFT AXILLARY  EXCISIONAL DEBRIDEMENT OF NECROTIC TISSUE;  Surgeon: Vickie Epley, MD;  Location: [facility] ORS;  Service: General;  Laterality: Left;    There were no vitals filed for this visit.  Subjective Assessment - 10/04/18 1504    Subjective   [name] returns to Occupational Therapy to day for treatment visit 25/36 to address LUE/ LUQ lymphedema following L axillary excisional debridement of necrotic tissue. Pt presents wearing custom compression sleeve, but garment is not in correct position. Pt reoprts, "Im feeling a little bit better today."    Limitations   LUE/ LUQ pain, swelling and odor from wound contribute to impaired basic and instrumental ADLs, limited participation in leisure pursuits and  work/ productive activities, impaired social participation, impaired role performance, impaired body image     Currently in Pain?  Yes    Pain Score  --   not rated numerically   Pain Location  Axilla    Pain Orientation  Left    Pain Descriptors / Indicators  Guarding;Tender;Numbness;Pressure;Sore;Tightness;Operative site guarding;Heaviness;Discomfort;Penetrating;Tiring    Pain Type  Chronic pain;Surgical pain                   OT Treatments/Exercises (OP) - 10/04/18 0001      ADLs   ADL Education Given  Yes      Manual Therapy   Manual Therapy  Edema management;Manual Lymphatic Drainage (MLD);Compression  Bandaging;Soft tissue mobilization    Manual Lymphatic Drainage (MLD)  MLD to LUQ and LUE utilizing short neck opening sequence, anterior axillo axillary anastomosis from L to R, ipsilateral axillo inguinal anastomosis and intercostal pathways.     Compression Bandaging  Max A to don sleeve after MLD    Other Manual Therapy  fibrosis technique to meial aspect of proximal arm tolerated well             OT Education - 10/04/18 1510    Education Details  Reviewed correct placement for compression sleeve to improve fit and function and maintain gradient at all landmarks.     Person(s) Educated  Patient    Methods  Explanation;Demonstration;Handout;Tactile cues;Verbal cues    Comprehension  Verbalized understanding;Need further instruction;Returned demonstration;Verbal cues required;Tactile cues required          OT Long Term Goals - 09/30/18 1720      OT LONG TERM GOAL #1   Title  Pt will be able to apply multi-layer short stretch compression wraps using correct gradient techniques with independently , but with extra time  to achieve optimal limb volume reduction during Intensive Phase CDT, and to return affected limb(s) , as closely as possible, to premorbid size and shape.    Baseline  Dependent    Time  2    Period  Days    Status  Achieved      OT LONG TERM GOAL #2   Title  Pt will demonstrate understanding of lymphedema (LE) precautions / prevention principals, including signs / symptoms of cellulitis infection with modified independence using LE Workbook as printed reference to identify 6 precautions without verbal cues by end of 3rd  OT Rx visit.     Baseline  no knowledge    Time  12    Period  Weeks    Status  On-going    Target Date  12/29/18      OT LONG TERM GOAL #3   Title  Pt to achieve no less than 10% limb volume reduction in affected limb(s) during Intensive Phase CDT to control limb swelling, to improve tissue integrity and immune function, to improve ADLs performance and to improve functional mobility/ transfers.    Baseline  dependent    Time  12    Period  Weeks    Status  On-going    Target Date  12/29/18      OT LONG TERM GOAL #4   Title  Pt will achieve 100% compliance with daily LE self-care home program components, including proper skin care, simple self-MLD, gradient compression wraps/ garments, and therapeutic exercise to ensure optimal Intensive Phase limb volume reduction to expedite compression garment/ device fitting.    Baseline  Max A    Time  12    Period  Weeks    Status  On-going    Target Date  12/29/18       OT LONG TERM GOAL #5   Title  Pt will demonstrate competent use of assistive devices during LE self-care training to achieve pain free dressing, bathing and grooming .    Baseline  Max A    Time  12    Period  Weeks    Status  Revised    Target Date  12/29/18      OT LONG TERM GOAL #6   Title  Pt will retain optimal limb volume reductions achieved during Intensive Phase CDT with no more than 3% volume increase  without CG  assistance to limit LE progression and further functional decline.    Baseline  Dependent    Time  6    Period  Months    Status  On-going            Plan - 10/04/18 1521    Clinical Impression Statement  Provided MLD in supine only to limit painful repositioning. Pt educated re used of kinesiotape or passisve stretch to lymphatiics. Applied test strib to medial R arm  to check skin tolerance of adhesive. If well tolerated will apply fan cuts to axillo-inguinal anastomosis to L arm and encourage improved lymphatic drainage to alternative pathways. Provided Pt edu re correct application for compression garment. Pt having difficulty tolerating donning due to pressure toward wound. Directed Pt t apply 2 layer gradient compression wrap to arm upon returning home. Swellin  throughut affected limb is causing sleeve to bunch at elvow and slide down, creating a tourniquent effect at antecubital fossa. Will consider resuming compression wraps  next visit.  New certification plan of care submitted today to cont OT 2-3 x weekly x 12 weeks and PRN to facilitate wound healing, limit infection risk, reduce limb volume and limit LE progression. Will initiate  active and passive ROM to shoulder when approved by surgeon.     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    OT Frequency  3x / week    OT Duration  12 weeks   and PRN   OT Treatment/Interventions  Self-care/ADL training;Therapeutic exercise;Manual lymph drainage;Compression bandaging;Patient/family education;Other (comment);Energy conservation;Therapeutic activities;Balance training;DME and/or AE instruction;Manual Therapy;Passive range of motion   Assist Pt with modification of TShirts with velcro cosures at shoulder and side seams to reduce pain     when dressing. Provide additional assistive device resources PRN.   Clinical Decision Making  Multiple treatment options, significant modification of task necessary    Recommended Other Services  fit with custom, clat knit LUE compression sleeve and HOS device. Consider seamless compression glove and vest. Consider kinesiotape during Intensive Phase    Consulted and Agree with Plan of Care  Patient       Patient will benefit from skilled therapeutic intervention in order to improve the following deficits and impairments:  Decreased endurance, Decreased mobility, Decreased skin integrity, Impaired sensation, Decreased knowledge of precautions, Decreased range of motion, Decreased scar mobility, Increased edema, Pain, Decreased activity tolerance, Decreased knowledge of use of DME, Decreased strength, Impaired flexibility, Impaired UE functional use, Decreased psychosocial skills, Decreased balance, Other (comment)(balance and gait negatively impacted by guarding  posture , impaired armswing and body asymmetry)  Visit Diagnosis: Lymphedema, not elsewhere classified - Plan: Ot plan of care cert/re-cert    Problem List Patient Active Problem List   Diagnosis Date Noted  . Open wound of  left axillary region 09/24/2018  . Anemia 05/10/2018  . Ileus ([place])   . Constipation 10/10/2017  . 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 Axillary mass, left 06/26/2017  . Chronic pain of left knee 12/14/2015  . Influenza vaccination declined 11/27/2015    Andrey Spearman, MS, OTR/L, [facility] 10/04/18  3:31 PM  [place] MAIN [facility] SERVICES [address] [place], [place], [address] Phone: [phone]   Fax:  [phone]  Name: Louis Davies MRN: [id number] Date of Birth: 1950/12/04

## 2018-10-05 ENCOUNTER — Ambulatory Visit: Payer: Medicare Other | Admitting: Occupational Therapy

## 2018-10-05 ENCOUNTER — Encounter: Payer: Medicare Other | Admitting: Occupational Therapy

## 2018-10-05 DIAGNOSIS — I890 Lymphedema, not elsewhere classified: Secondary | ICD-10-CM | POA: Diagnosis not present

## 2018-10-05 DIAGNOSIS — C4362 Malignant melanoma of left upper limb, including shoulder: Secondary | ICD-10-CM

## 2018-10-05 DIAGNOSIS — I96 Gangrene, not elsewhere classified: Secondary | ICD-10-CM

## 2018-10-06 ENCOUNTER — Other Ambulatory Visit: Payer: Self-pay

## 2018-10-06 ENCOUNTER — Inpatient Hospital Stay: Payer: Medicare Other | Admitting: Hospice and Palliative Medicine

## 2018-10-06 ENCOUNTER — Inpatient Hospital Stay: Payer: Medicare Other

## 2018-10-06 ENCOUNTER — Inpatient Hospital Stay (HOSPITAL_BASED_OUTPATIENT_CLINIC_OR_DEPARTMENT_OTHER): Payer: Medicare Other | Admitting: Oncology

## 2018-10-06 ENCOUNTER — Encounter: Payer: Medicare Other | Admitting: Occupational Therapy

## 2018-10-06 VITALS — BP 119/64 | HR 64 | Temp 97.6°F | Ht 71.0 in | Wt 159.9 lb

## 2018-10-06 DIAGNOSIS — Z801 Family history of malignant neoplasm of trachea, bronchus and lung: Secondary | ICD-10-CM | POA: Diagnosis not present

## 2018-10-06 DIAGNOSIS — R5383 Other fatigue: Secondary | ICD-10-CM | POA: Diagnosis not present

## 2018-10-06 DIAGNOSIS — D649 Anemia, unspecified: Secondary | ICD-10-CM

## 2018-10-06 DIAGNOSIS — Z8042 Family history of malignant neoplasm of prostate: Secondary | ICD-10-CM | POA: Diagnosis not present

## 2018-10-06 DIAGNOSIS — C799 Secondary malignant neoplasm of unspecified site: Secondary | ICD-10-CM

## 2018-10-06 DIAGNOSIS — Z1509 Genetic susceptibility to other malignant neoplasm: Secondary | ICD-10-CM

## 2018-10-06 DIAGNOSIS — C7951 Secondary malignant neoplasm of bone: Secondary | ICD-10-CM | POA: Diagnosis not present

## 2018-10-06 DIAGNOSIS — R531 Weakness: Secondary | ICD-10-CM | POA: Diagnosis not present

## 2018-10-06 DIAGNOSIS — C781 Secondary malignant neoplasm of mediastinum: Secondary | ICD-10-CM

## 2018-10-06 DIAGNOSIS — Z79899 Other long term (current) drug therapy: Secondary | ICD-10-CM

## 2018-10-06 DIAGNOSIS — M129 Arthropathy, unspecified: Secondary | ICD-10-CM

## 2018-10-06 DIAGNOSIS — C439 Malignant melanoma of skin, unspecified: Secondary | ICD-10-CM

## 2018-10-06 LAB — CBC WITH DIFFERENTIAL/PLATELET
Abs Immature Granulocytes: 0.02 10*3/uL (ref 0.00–0.07)
BASOS PCT: 1 %
Basophils Absolute: 0 10*3/uL (ref 0.0–0.1)
EOS ABS: 0.1 10*3/uL (ref 0.0–0.5)
Eosinophils Relative: 1 %
HCT: 31.4 % — ABNORMAL LOW (ref 39.0–52.0)
Hemoglobin: 9.2 g/dL — ABNORMAL LOW (ref 13.0–17.0)
Immature Granulocytes: 0 %
Lymphocytes Relative: 11 %
Lymphs Abs: 0.7 10*3/uL (ref 0.7–4.0)
MCH: 21.6 pg — ABNORMAL LOW (ref 26.0–34.0)
MCHC: 29.3 g/dL — ABNORMAL LOW (ref 30.0–36.0)
MCV: 73.9 fL — ABNORMAL LOW (ref 80.0–100.0)
Monocytes Absolute: 0.4 10*3/uL (ref 0.1–1.0)
Monocytes Relative: 6 %
Neutro Abs: 5.2 10*3/uL (ref 1.7–7.7)
Neutrophils Relative %: 81 %
PLATELETS: 336 10*3/uL (ref 150–400)
RBC: 4.25 MIL/uL (ref 4.22–5.81)
RDW: 20.3 % — ABNORMAL HIGH (ref 11.5–15.5)
WBC: 6.4 10*3/uL (ref 4.0–10.5)
nRBC: 0 % (ref 0.0–0.2)

## 2018-10-06 LAB — COMPREHENSIVE METABOLIC PANEL
ALT: 13 U/L (ref 0–44)
AST: 15 U/L (ref 15–41)
Albumin: 3 g/dL — ABNORMAL LOW (ref 3.5–5.0)
Alkaline Phosphatase: 43 U/L (ref 38–126)
Anion gap: 11 (ref 5–15)
BILIRUBIN TOTAL: 0.4 mg/dL (ref 0.3–1.2)
BUN: 18 mg/dL (ref 8–23)
CALCIUM: 8.6 mg/dL — AB (ref 8.9–10.3)
CO2: 26 mmol/L (ref 22–32)
CREATININE: 0.86 mg/dL (ref 0.61–1.24)
Chloride: 104 mmol/L (ref 98–111)
GFR calc Af Amer: >60 mL/min (ref >60)
GFR calc non Af Amer: >60 mL/min (ref >60)
Glucose, Bld: 112 mg/dL — ABNORMAL HIGH (ref 70–99)
Potassium: 3.6 mmol/L (ref 3.5–5.1)
Sodium: 141 mmol/L (ref 135–145)
Total Protein: 6.6 g/dL (ref 6.5–8.1)

## 2018-10-06 NOTE — Telephone Encounter (Signed)
Dr. Shann Medal office was able to help patient out with letter for his supplies. Patient came in this morning and was seen by Dr. Grayland Ormond for wound care.

## 2018-10-06 NOTE — Therapy (Signed)
[place] MAIN [facility] SERVICES [address] [place], [place], [address] Phone: [phone]   Fax:  [phone]  Occupational Therapy Treatment  Patient Details  Name: Louis Davies MRN: [id number] Date of Birth: 1951-04-19 No data recorded  Encounter Date: 10/05/2018  OT End of Session - 10/05/18 1300    Visit Number  26    Number of Visits  36    Date for OT Re-Evaluation  01/02/19    Authorization Type  initial cert ended 92/1/19. initiated . NEW CERT submitted on 41/74/0814    Authorization Time Period  cont OT 2-3 x weekly x 12 weeks/ 36 visits to address LUE/LUQ secondary lymphedema and to facilitate wound healing     OT Start Time  1000    OT Stop Time  1130    OT Time Calculation (min)  90 min    Equipment Utilized During Treatment  kinesiotex 2"    Activity Tolerance  Patient tolerated treatment well;Patient limited by pain    Behavior During Therapy  WFL for tasks assessed/performed       Past Medical History:  Diagnosis Date  . Anemia   . Arthritis   . Cancer ([place])    MELANOMA  . Complication of anesthesia   . Dyspnea    doe  . Perforated viscus   . PONV (postoperative nausea and vomiting)     Past Surgical History:  Procedure Laterality Date  . AXILLARY LYMPH NODE DISSECTION Left 06/26/2017   Procedure: EXCISION OF LEFT AXILLARY MASS;  Surgeon: Johnathan Hausen, MD;  Location: [facility] ORS;  Service: General;  Laterality: Left;  . DIAGNOSTIC LAPAROSCOPY    . EMBOLIZATION Left 05/11/2018   Procedure: EMBOLIZATION;  Surgeon: Katha Cabal, MD;  Location: [facility];  Service: Cardiovascular;  Laterality: Left;  . LAPAROTOMY N/A 10/14/2017   Procedure: EXPLORATORY LAPAROTOMY;  Surgeon: Clayburn Pert, MD;  Location: [facility] ORS;  Service: General;  Laterality: N/A;  . VASECTOMY    . WOUND DEBRIDEMENT Left 09/24/2018   Procedure: LEFT AXILLARY EXCISIONAL DEBRIDEMENT OF NECROTIC TISSUE;  Surgeon: Vickie Epley, MD;  Location: [facility] ORS;  Service: General;  Laterality: Left;    There were no vitals filed for this visit.  Subjective Assessment - 10/05/18 1103    Subjective   [name] returns to Occupational Therapy to day for treatment visit 26/36 to address LUE/ LUQ lymphedema following L axillary excisional debridement of necrotic tissue. Pt presents wearing custom compression sleeve without glove. Pt has no new complaints today. He states, "I feel better today than I have ."    Limitations   LUE/ LUQ pain, swelling and odor from wound contribute to impaired basic and instrumental ADLs, limited participation in leisure pursuits and  work/ productive activities, impaired social participation, impaired role performance, impaired body image                    OT Treatments/Exercises (OP) - 10/06/18 0001      ADLs   ADL Education Given  Yes      Manual Therapy   Manual Therapy  Edema management;Manual Lymphatic Drainage (MLD);Compression Bandaging;Soft tissue mobilization;Taping    Manual Lymphatic Drainage (MLD)  MLD to LUQ and LUE utilizing short neck opening sequence, anterior axillo axillary anastomosis from L to R, ipsilateral axillo inguinal anastomosis and intercostal pathways.     Compression Bandaging  Max A to don sleeve after MLD    Other Manual Therapy  fibrosis technique to meial aspect of proximal arm tolerated well    Kinesiotex  --   Two fan cut strips anchored at superior clavical and at ante            OT Education - 10/05/18 1300    Education Details  Continued skilled Pt/caregiver education  And LE ADL training throughout visit for lymphedema self care/ home program, including compression wrapping, compression garment and device wear/care, lymphatic pumping ther ex, simple self-MLD, and skin care. Discussed progress towards goals.     Person(s) Educated  Patient    Methods  Explanation;Demonstration;Handout;Tactile cues;Verbal cues    Comprehension   Verbalized understanding;Need further instruction;Returned demonstration;Verbal cues required;Tactile cues required          OT Long Term Goals - 09/30/18 1720      OT LONG TERM GOAL #1   Title  Pt will be able to apply multi-layer short stretch compression wraps using correct gradient techniques with independently , but with extra time  to achieve optimal limb volume reduction during Intensive Phase CDT, and to return affected limb(s) , as closely as possible, to premorbid size and shape.    Baseline  Dependent    Time  2    Period  Days    Status  Achieved      OT LONG TERM GOAL #2   Title  Pt will demonstrate understanding of lymphedema (LE) precautions / prevention principals, including signs / symptoms of cellulitis infection with modified independence using LE Workbook as printed reference to identify 6 precautions without verbal cues by end of 3rd  OT Rx visit.     Baseline  no knowledge    Time  12    Period  Weeks    Status  On-going    Target Date  12/29/18      OT LONG TERM GOAL #3   Title  Pt to achieve no less than 10% limb volume reduction in affected limb(s) during Intensive Phase CDT to control limb swelling, to improve tissue integrity and immune function, to improve ADLs performance and to improve functional mobility/ transfers.    Baseline  dependent    Time  12    Period  Weeks    Status  On-going    Target Date  12/29/18      OT LONG TERM GOAL #4   Title  Pt will achieve 100% compliance with daily LE self-care home program components, including proper skin care, simple self-MLD, gradient compression wraps/ garments, and therapeutic exercise to ensure optimal Intensive Phase limb volume reduction to expedite compression garment/ device fitting.    Baseline  Max A    Time  12    Period  Weeks    Status  On-going    Target Date  12/29/18      OT LONG TERM GOAL #5   Title  Pt will demonstrate competent use of assistive devices during LE self-care training to  achieve pain free dressing, bathing and grooming .    Baseline  Max A    Time  12    Period  Weeks    Status  Revised    Target Date  12/29/18      OT LONG TERM GOAL #6   Title  Pt will retain optimal limb volume reductions achieved during Intensive Phase CDT with no more than 3% volume increase  without CG assistance to limit LE progression and further functional decline.    Baseline  Dependent  Time  6    Period  Months    Status  On-going            Plan - 10/06/18 1302    Clinical Impression Statement  Pt engaged fully in all aspects of manual therapy today without inceased pain. Provided MLD utilizing adjacent axillary and inguinal anastomoses, and fibrosis techniques to  forearm and volar arm  inferior to wound dressing. Applied kinesiotaoe to arm and PAA in effort to encourage lymphatic movemt towards alternative pathways. Pt dnned sleeve after session independently with extra time. Pt instructed to continue utilizing compression wraps in the afternoon and for HOS when less active. Good response to fluid reduction overnight and after MLD. Cont as per POC.    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    OT Frequency  3x / week    OT Duration  12 weeks   and PRN   OT Treatment/Interventions  Self-care/ADL training;Therapeutic exercise;Manual lymph drainage;Compression bandaging;Patient/family education;Other (comment);Energy conservation;Therapeutic activities;Balance training;DME and/or AE  instruction;Manual Therapy;Passive range of motion   Assist Pt with modification of TShirts with velcro cosures at shoulder and side seams to reduce pain     when dressing. Provide additional assistive device resources PRN.   Clinical Decision Making  Multiple treatment options, significant modification of task necessary    Recommended Other Services  fit with custom, clat knit LUE compression sleeve and HOS device. Consider seamless compression glove and vest. Consider kinesiotape during Intensive Phase    Consulted and Agree with Plan of Care  Patient       Patient will benefit from skilled therapeutic intervention in order to improve the following deficits and impairments:  Decreased endurance, Decreased mobility, Decreased skin integrity, Impaired sensation, Decreased knowledge of precautions, Decreased range of motion, Decreased scar mobility, Increased edema, Pain, Decreased activity tolerance, Decreased knowledge of use of DME, Decreased strength, Impaired flexibility, Impaired UE functional use, Decreased psychosocial skills, Decreased balance, Other (comment)(balance and gait negatively impacted by guarding  posture , impaired armswing and body asymmetry)  Visit Diagnosis: Lymphedema, not elsewhere classified    Problem List Patient Active Problem List   Diagnosis Date Noted  . Localized tissue death ([place])   . 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 Open wound of left axillary region with complication 99/24/2683  . Anemia 05/10/2018  . Ileus ([place])   . Constipation 10/10/2017  . Metastatic melanoma ([place]) 07/07/2017  . Axillary mass, left 06/26/2017  . Chronic pain of left knee 12/14/2015  . Influenza vaccination declined 11/27/2015    Andrey Spearman, MS, OTR/L, [facility] 10/06/18 1:06 PM  [place] MAIN [facility] SERVICES [address] [place], [place], [address] Phone: [phone]   Fax:   [phone]  Name: Louis Davies MRN: [id number] Date of Birth: 08/14/1951
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
## 2018-10-06 NOTE — Progress Notes (Signed)
Patient is here today to follow up on his melanoma. Patient had a debridement done on his wound on 09/24/18, by Dr. Rosana Hoes. Patient stated that he had been doing better.

## 2018-10-07 ENCOUNTER — Encounter: Payer: Medicare Other | Admitting: Occupational Therapy

## 2018-10-07 ENCOUNTER — Ambulatory Visit (INDEPENDENT_AMBULATORY_CARE_PROVIDER_SITE_OTHER): Payer: Medicare Other | Admitting: Surgery

## 2018-10-07 ENCOUNTER — Encounter: Payer: Self-pay | Admitting: Surgery

## 2018-10-07 ENCOUNTER — Other Ambulatory Visit: Payer: Self-pay

## 2018-10-07 ENCOUNTER — Ambulatory Visit: Payer: Medicare Other | Admitting: Occupational Therapy

## 2018-10-07 VITALS — BP 130/70 | HR 70 | Temp 97.8°F | Resp 12 | Ht 69.0 in | Wt 161.0 lb

## 2018-10-07 DIAGNOSIS — I890 Lymphedema, not elsewhere classified: Secondary | ICD-10-CM

## 2018-10-07 DIAGNOSIS — Z4889 Encounter for other specified surgical aftercare: Secondary | ICD-10-CM

## 2018-10-07 DIAGNOSIS — R2232 Localized swelling, mass and lump, left upper limb: Secondary | ICD-10-CM

## 2018-10-07 DIAGNOSIS — C439 Malignant melanoma of skin, unspecified: Secondary | ICD-10-CM

## 2018-10-07 DIAGNOSIS — C799 Secondary malignant neoplasm of unspecified site: Secondary | ICD-10-CM

## 2018-10-07 LAB — THYROID PANEL WITH TSH
Free Thyroxine Index: 2 (ref 1.2–4.9)
T3 Uptake Ratio: 31 % (ref 24–39)
T4, Total: 6.3 ug/dL (ref 4.5–12.0)
TSH: 2.76 u[IU]/mL (ref 0.450–4.500)

## 2018-10-07 NOTE — Therapy (Signed)
[place] MAIN [facility] SERVICES [address] [place], [place], [address] Phone: [phone]   Fax:  [phone]  Occupational Therapy Treatment  Patient Details  Name: Louis Davies MRN: [id number] Date of Birth: September 06, 1951 No data recorded  Encounter Date: 10/07/2018  OT End of Session - 10/07/18 1645    Visit Number  27    Number of Visits  36    Date for OT Re-Evaluation  01/02/19    Authorization Type  initial cert ended 79/8/92. initiated . NEW CERT submitted on 11/94/1740    Authorization Time Period  cont OT 2-3 x weekly x 12 weeks/ 36 visits to address LUE/LUQ secondary lymphedema and to facilitate wound healing     OT Start Time  1000    OT Stop Time  1111    OT Time Calculation (min)  71 min    Equipment Utilized During Treatment  kinesiotex 2"    Activity Tolerance  Patient tolerated treatment well;Patient limited by pain    Behavior During Therapy  WFL for tasks assessed/performed       Past Medical History:  Diagnosis Date  . Anemia   . Arthritis   . Cancer ([place])    MELANOMA  . Complication of anesthesia   . Dyspnea    doe  . Perforated viscus   . PONV (postoperative nausea and vomiting)     Past Surgical History:  Procedure Laterality Date  . AXILLARY LYMPH NODE DISSECTION Left 06/26/2017   Procedure: EXCISION OF LEFT AXILLARY MASS;  Surgeon: Louis Hausen, MD;  Location: [facility] ORS;  Service: General;  Laterality: Left;  . DIAGNOSTIC LAPAROSCOPY    . EMBOLIZATION Left 05/11/2018   Procedure: EMBOLIZATION;  Surgeon: Katha Cabal, MD;  Location: [facility] LAB;  Service: Cardiovascular;  Laterality: Left;  . LAPAROTOMY N/A 10/14/2017   Procedure: EXPLORATORY LAPAROTOMY;  Surgeon: Clayburn Pert, MD;  Location: [facility] ORS;  Service: General;  Laterality: N/A;  . VASECTOMY    . WOUND DEBRIDEMENT Left 09/24/2018   Procedure: LEFT AXILLARY EXCISIONAL DEBRIDEMENT OF NECROTIC TISSUE;  Surgeon: Vickie Epley, MD;  Location: [facility] ORS;  Service: General;  Laterality: Left;    There were no vitals filed for this visit.  Subjective Assessment - 10/07/18 1124    Subjective   Pt returns for OT treatment visit 27/36 to address LUE/ LUQ lymphedema 2/2 stage 4 melanoma. Recent L axillary excisional debridement of necrotic tissue exacerbated trunk and arm swelling.  Pt wears custom, ccl 2 compression arm sleeve to clinic.Marland Kitchen He re0ports that he has an appointment with his surgeon this afternoon.    Limitations   LUE/ LUQ pain, swelling and odor from wound contribute to impaired basic and instrumental ADLs, limited participation in leisure pursuits and  work/ productive activities, impaired social participation, impaired role performance, impaired body image     Currently in Pain?  Yes   not rated numerically   Pain Location  Axilla    Pain Orientation  Left    Pain Descriptors / Indicators  Guarding;Tender;Pressure;Sore;Tightness;Heaviness;Discomfort;Grimacing;Tiring    Pain Type  Chronic pain                   OT Treatments/Exercises (OP) - 10/07/18 0001      ADLs   ADL Education Given  Yes      Manual Therapy   Manual Therapy  Edema management;Manual Lymphatic Drainage (MLD);Compression Bandaging;Soft tissue mobilization;Taping    Manual Lymphatic Drainage (MLD)  MLD to LUQ and LUE utilizing short neck opening sequence, anterior axillo axillary anastomosis from L to R, ipsilateral axillo inguinal anastomosis and intercostal pathways.     Compression Bandaging  Max A to don sleeve after MLD    Other Manual Therapy  fibrosis technique to meial aspect of proximal arm tolerated well    Kinesiotex  --   LUE/LUQ kinesiotape remains in place            OT Education - 10/07/18 1645    Education Details  Continued skilled Pt/caregiver education  And LE ADL training throughout visit for lymphedema self care/ home program, including compression wrapping, compression garment and device  wear/care, lymphatic pumping ther ex, simple self-MLD, and skin care. Discussed progress towards goals.     Person(s) Educated  Patient    Methods  Explanation;Demonstration;Handout;Tactile cues;Verbal cues    Comprehension  Verbalized understanding;Need further instruction;Returned demonstration;Verbal cues required;Tactile cues required          OT Long Term Goals - 09/30/18 1720      OT LONG TERM GOAL #1   Title  Pt will be able to apply multi-layer short stretch compression wraps using correct gradient techniques with independently , but with extra time  to achieve optimal limb volume reduction during Intensive Phase CDT, and to return affected limb(s) , as closely as possible, to premorbid size and shape.    Baseline  Dependent    Time  2    Period  Days    Status  Achieved      OT LONG TERM GOAL #2   Title  Pt will demonstrate understanding of lymphedema (LE) precautions / prevention principals, including signs / symptoms of cellulitis infection with modified independence using LE Workbook as printed reference to identify 6 precautions without verbal cues by end of 3rd  OT Rx visit.     Baseline  no knowledge    Time  12    Period  Weeks    Status  On-going    Target Date  12/29/18      OT LONG TERM GOAL #3   Title  Pt to achieve no less than 10% limb volume reduction in affected limb(s) during Intensive Phase CDT to control limb swelling, to improve tissue integrity and immune function, to improve ADLs performance and to improve functional mobility/ transfers.    Baseline  dependent    Time  12    Period  Weeks    Status  On-going    Target Date  12/29/18      OT LONG TERM GOAL #4   Title  Pt will achieve 100% compliance with daily LE self-care home program components, including proper skin care, simple self-MLD, gradient compression wraps/ garments, and therapeutic exercise to ensure optimal Intensive Phase limb volume reduction to expedite compression garment/ device  fitting.    Baseline  Max A    Time  12    Period  Weeks    Status  On-going    Target Date  12/29/18      OT LONG TERM GOAL #5   Title  Pt will demonstrate competent use of assistive devices during LE self-care training to achieve pain free dressing, bathing and grooming .    Baseline  Max A    Time  12    Period  Weeks    Status  Revised    Target Date  12/29/18      OT LONG TERM GOAL #6   Title  Pt will retain optimal limb volume reductions achieved during Intensive Phase CDT with no more than 3% volume increase  without CG assistance to limit LE progression and further functional decline.    Baseline  Dependent    Time  6    Period  Months    Status  On-going            Plan - 10/07/18 1646    Clinical Impression Statement  Nighttime compression wraps combined with daytime compression sleeve is helping with swelling reduction and with decreasing tissue density. Pt instructed to resume wrapping if sleeve becomes too tight at wrist, elbow or top border. Provided MLD as established in supine directing lymphatic congestion  toward contralateral axilla anteriorly and inferiorly towards ipsilateral inguinal LN. Provided deeper MLD to intercostal spaces and fibrosis technique to forearm. Hand swelling well controlled. Malodorous wound noted throughout session today, which is atypical for AM appointment when dressings are fresh. Despite being OOC over the [place] holiday, offered to provide lymphedema care PRN. Pt will  confirm decision after he sees surgeon today. Cont as per POC. Increase Rx frequency PRN, or decrease 2/2 burden of care PRN.     Occupational performance deficits (Please refer to evaluation for details):  ADL's;Leisure;Social Participation;IADL's;Rest and Sleep;Work;Other   body image   Rehab Potential  Good    Current Impairments/barriers affecting progress:  non-healing axillary wound, after effects of radiation ( completed in August)  and axilary web syndrom  contributing to ongoing inflamatory  response in affected quadrant- exacerbating limb and trunkal swelling. 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    OT Frequency  3x / week    OT Duration  12 weeks   and PRN   OT Treatment/Interventions  Self-care/ADL training;Therapeutic exercise;Manual lymph drainage;Compression bandaging;Patient/family education;Other (comment);Energy conservation;Therapeutic activities;Balance training;DME and/or AE instruction;Manual Therapy;Passive range of motion   Assist Pt with modification of TShirts with velcro cosures at shoulder and side seams to reduce pain     when dressing. Provide additional assistive device resources PRN.   Clinical Decision Making  Multiple treatment options, significant modification of task necessary    Recommended Other Services  fit with custom, clat knit LUE compression sleeve and HOS device. Consider seamless compression glove and vest. Consider kinesiotape during Intensive Phase    Consulted and Agree with Plan of Care  Patient       Patient will benefit from skilled therapeutic intervention in order to improve the following deficits and impairments:  Decreased endurance, Decreased mobility, Decreased skin integrity, Impaired sensation, Decreased knowledge of precautions, Decreased range of motion, Decreased scar mobility, Increased edema, Pain, Decreased activity tolerance, Decreased knowledge of use of DME, Decreased strength, Impaired flexibility, Impaired UE functional use, Decreased psychosocial skills, Decreased balance, Other (comment)(balance and gait negatively impacted by guarding  posture , impaired armswing and body asymmetry)  Visit Diagnosis: Lymphedema, not elsewhere classified    Problem List Patient Active Problem List   Diagnosis Date Noted  . Localized tissue death ([place])   . 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 Open wound of  left axillary region with complication 37/16/9678  . Anemia 05/10/2018  . Ileus ([place])   . Constipation 10/10/2017  . Metastatic melanoma ([place]) 07/07/2017  . Axillary mass, left 06/26/2017  . Chronic pain of left knee 12/14/2015  . Influenza vaccination declined 11/27/2015    Andrey Spearman, MS, OTR/L, [facility] 10/07/18 4:55 PM   [place]  [place] MAIN [facility] SERVICES [address] [place], [place], [address] Phone: [phone]   Fax:  [phone]  Name: Louis Davies MRN: [id number] Date of Birth: [birth year]

## 2018-10-07 NOTE — Progress Notes (Signed)
Surgical Clinic Progress/Follow-up Note   HPI:  67 y.o. Male presents to clinic for follow-up evaluation 2 weeks s/p excision of large necrotic Left axillary mass (8cm deepx8.5cm A-P x 7 cm wide)and sharp excisional debridement ofLeftaxillarysoft tissue/tumornecrosis(Sholanda Croson, 09/24/2018). Patient reports he continues have improved LUE mobility with decreased foul-smelling odor associated with tissue/tumor necrosis. However, patient and his wife also describe enlarged growth, particularly of the necrotic mass at the base of his wound and lesser increased edematous white tissue throughout his wound. While less malodorous than pre-op with smaller firm edema surrounding perimeters of patient's wound with patient able to lower and raise arm easier than pre-debridement, growth and necrosis have increased since his recent surgery. Patient otherwise denies N/V, fever/chills, CP, or SOB.  Review of Systems:  Constitutional: denies fever/chills  Respiratory: denies shortness of breath, wheezing  Cardiovascular: denies chest pain, palpitations  Skin: Denies any other rashes or skin discolorations except post-surgical wounds as per interval history  Vital Signs:  BP 130/70   Pulse 70   Temp 97.8 F (36.6 C) (Skin)   Resp 12   Ht 5\' 9"  (1.753 m)   Wt 161 lb (73 kg)   SpO2 98%   BMI 23.78 kg/m    Physical Exam:  Constitutional:  -- Normal body habitus  -- Awake, alert, and oriented x3  Pulmonary:  -- No crackles -- Equal breath sounds bilaterally -- Breathing non-labored at rest Cardiovascular:  -- S1, S2 present  -- No pericardial rubs  Musculoskeletal / Integumentary:  -- Wounds or skin discoloration: Enlarged dark and friable mildly malodorous necrotic mass at the base of patient's Left axilla in comparison to as evaluated last week, otherwise with lesser increased edematous white firm irregular tissues across the base of the Left axillary wound -- Extremities: B/L UE and LE FROM,  hands and feet warm, chronic LUE lymphedema with compression stocking in place   Laboratory studies:  CBC Latest Ref Rng & Units 10/06/2018 09/25/2018 09/24/2018  WBC 4.0 - 10.5 K/uL 6.4 15.0(H) 15.7(H)  Hemoglobin 13.0 - 17.0 g/dL 9.2(L) 8.6(L) 8.9(L)  Hematocrit 39.0 - 52.0 % 31.4(L) 28.4(L) 28.2(L)  Platelets 150 - 400 K/uL 336 310 290   CMP Latest Ref Rng & Units 10/06/2018 09/25/2018 09/23/2018  Glucose 70 - 99 mg/dL 112(H) 124(H) 114(H)  BUN 8 - 23 mg/dL 18 13 12   Creatinine 0.61 - 1.24 mg/dL 0.86 0.75 1.07  Sodium 135 - 145 mmol/L 141 138 130(L)  Potassium 3.5 - 5.1 mmol/L 3.6 3.9 4.0  Chloride 98 - 111 mmol/L 104 106 95(L)  CO2 22 - 32 mmol/L 26 25 25   Calcium 8.9 - 10.3 mg/dL 8.6(L) 7.8(L) 7.8(L)  Total Protein 6.5 - 8.1 g/dL 6.6 - 6.4(L)  Total Bilirubin 0.3 - 1.2 mg/dL 0.4 - 1.0  Alkaline Phos 38 - 126 U/L 43 - 49  AST 15 - 41 U/L 15 - 15  ALT 0 - 44 U/L 13 - 9   Imaging: No new pertinent imaging available for review  Assessment:  67 y.o. yo Male with a problem list including...  Patient Active Problem List   Diagnosis Date Noted  . Localized tissue death ([place])   . 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 Open wound of left axillary region with complication 91/47/8295  . Anemia 05/10/2018  . Ileus ([place])   . Constipation 10/10/2017  . Metastatic melanoma ([place]) 07/07/2017  . Axillary mass, left 06/26/2017  . Chronic pain of left knee 12/14/2015  .  Influenza vaccination declined 11/27/2015    presents to clinic for follow-up evaluation, doing okay despite enlarged dark and friable mildly malodorous necrotic mass at the base of patient's Left axilla in comparison to as evaluated last week, otherwise with lesser increased edematous white firm irregular tissues across the base of the Left axillary wound, concerning in the context of aggressive Left axillary malignant melanoma metastases 2 weeks s/p excision of large necrotic Left axillary mass  (8cm deepx8.5cm A-P x 7 cm wide)and sharp excisional debridement ofLeftaxillarysoft tissue/tumornecrosis(Shaguana Love, 09/24/2018).  Plan:              - will discuss with Dr. Grayland Ormond             - agree with upcoming PET and appointment with melanoma specialty clinic             - can certainly perform further debridement as needed, but for palliation and only as part of a broader plan for treatment             - continue with once daily packing and dressing changes (3/4 roll dry Kerlix gauze reinserted)             - return to clinic to be determined, instructed to call office if any questions or concerns  All of the above recommendations were discussed with the patient and patient's wife, and all of patient's and family's questions were answered to their expressed satisfaction.  -- Marilynne Drivers Rosana Hoes, MD, [place]: [facility] - Partnering for exceptional care. Office: [phone]

## 2018-10-07 NOTE — Patient Instructions (Signed)
The patient is aware to call back for any questions or concerns.  

## 2018-10-08 ENCOUNTER — Telehealth: Payer: Self-pay | Admitting: *Deleted

## 2018-10-08 NOTE — Telephone Encounter (Signed)
Left vm for patient regarding restarting Zelboraf. Pt informed that per discussion with Dr. Grayland Ormond and Dr. Rosana Hoes he should restart his Zelboraf at this time. Pt to remain on the same dose of Zelboraf and keep all f/u as scheduled at [facility] and [facility].

## 2018-10-11 ENCOUNTER — Ambulatory Visit: Payer: Medicare Other | Admitting: Occupational Therapy

## 2018-10-11 ENCOUNTER — Encounter: Payer: Medicare Other | Admitting: Occupational Therapy

## 2018-10-11 DIAGNOSIS — I890 Lymphedema, not elsewhere classified: Secondary | ICD-10-CM

## 2018-10-11 NOTE — Therapy (Signed)
[place] MAIN [facility] SERVICES [address] [place], [place], [address] Phone: [phone]   Fax:  [phone]  Occupational Therapy Treatment  Patient Details  Name: Louis Davies MRN: [id number] Date of Birth: [birth year] No data recorded  Encounter Date: 10/11/2018  OT End of Session - 10/11/18 1608    Visit Number  28    Number of Visits  36    Date for OT Re-Evaluation  01/02/19    Authorization Type  initial cert ended 42/5/95. initiated . NEW CERT submitted on 63/87/5643    Authorization Time Period  cont OT 2-3 x weekly x 12 weeks/ 36 visits to address LUE/LUQ secondary lymphedema and to facilitate wound healing     OT Start Time  0200    OT Stop Time  0330    OT Time Calculation (min)  90 min    Equipment Utilized During Treatment  kinesiotex 2"    Activity Tolerance  Patient tolerated treatment well;Patient limited by pain    Behavior During Therapy  WFL for tasks assessed/performed       Past Medical History:  Diagnosis Date  . Anemia   . Arthritis   . Cancer ([place])    MELANOMA  . Complication of anesthesia   . Dyspnea    doe  . Perforated viscus   . PONV (postoperative nausea and vomiting)     Past Surgical History:  Procedure Laterality Date  . AXILLARY LYMPH NODE DISSECTION Left 06/26/2017   Procedure: EXCISION OF LEFT AXILLARY MASS;  Surgeon: Johnathan Hausen, MD;  Location: [facility] ORS;  Service: General;  Laterality: Left;  . DIAGNOSTIC LAPAROSCOPY    . EMBOLIZATION Left 05/11/2018   Procedure: EMBOLIZATION;  Surgeon: Katha Cabal, MD;  Location: [facility];  Service: Cardiovascular;  Laterality: Left;  . LAPAROTOMY N/A 10/14/2017   Procedure: EXPLORATORY LAPAROTOMY;  Surgeon: Clayburn Pert, MD;  Location: [facility] ORS;  Service: General;  Laterality: N/A;  . VASECTOMY    . WOUND DEBRIDEMENT Left 09/24/2018   Procedure: LEFT AXILLARY EXCISIONAL DEBRIDEMENT OF NECROTIC TISSUE;  Surgeon: Vickie Epley, MD;  Location: [facility] ORS;  Service: General;  Laterality: Left;    There were no vitals filed for this visit.  Subjective Assessment - 10/11/18 1603    Subjective   Pt returns for OT treatment visit 28/36 to address LUE/ LUQ lymphedema 2/2 stage 4 melanoma. Recent L axillary excisional debridement of necrotic tissue exacerbated trunk and arm swelling.  Pt wears custom, ccl 2 compression arm sleeve to clinic.Marland Kitchen Pt reports he sees melanoma specialist at [facility] on Jan 6th. He reports his surgeon will not further debride axilla due to upcoming PET. Pt reports he sees wound care specialist at [facility]  as well. His next OT session for LE care is Jan 16. Pt encouraged to call if he feels he needs MLD during this interval and we will work him in.     Limitations   LUE/ LUQ pain, swelling and odor from wound contribute to impaired basic and instrumental ADLs, limited participation in leisure pursuits and  work/ productive activities, impaired social participation, impaired role performance, impaired body image     Currently in Pain?  Yes   not rated numerically   Pain Location  Axilla    Pain Orientation  Left    Pain Descriptors / Indicators  Guarding;Nagging;Tender;Pressure;Sore;Tightness;Operative site guarding;Heaviness;Discomfort;Tiring;Grimacing    Pain Type  Chronic pain;Surgical pain  OT Treatments/Exercises (OP) - 10/11/18 0001      ADLs   ADL Education Given  Yes      Manual Therapy   Manual Therapy  Edema management;Manual Lymphatic Drainage (MLD);Compression Bandaging;Soft tissue mobilization;Taping    Manual therapy comments  applied 8 " ace wrap over wound dressing to hold it in place until Pt gets home. added cotton stockinett pad over wound dressing to catch excess drainage.    Manual Lymphatic Drainage (MLD)  MLD to LUQ and LUE utilizing short neck opening sequence, anterior axillo axillary anastomosis from L to R, ipsilateral axillo inguinal anastomosis  and intercostal pathways.     Compression Bandaging  Max A to don sleeve after MLD    Other Manual Therapy  fibrosis technique to meial aspect of proximal arm tolerated well    Kinesiotex  Edema             OT Education - 10/11/18 1608    Education Details  Continued skilled Pt/caregiver education  And LE ADL training throughout visit for lymphedema self care/ home program, including compression wrapping, compression garment and device wear/care, lymphatic pumping ther ex, simple self-MLD, and skin care. Discussed progress towards goals.     Person(s) Educated  Patient    Methods  Explanation;Demonstration;Handout;Tactile cues;Verbal cues    Comprehension  Verbalized understanding;Need further instruction;Returned demonstration;Verbal cues required;Tactile cues required          OT Long Term Goals - 09/30/18 1720      OT LONG TERM GOAL #1   Title  Pt will be able to apply multi-layer short stretch compression wraps using correct gradient techniques with independently , but with extra time  to achieve optimal limb volume reduction during Intensive Phase CDT, and to return affected limb(s) , as closely as possible, to premorbid size and shape.    Baseline  Dependent    Time  2    Period  Days    Status  Achieved      OT LONG TERM GOAL #2   Title  Pt will demonstrate understanding of lymphedema (LE) precautions / prevention principals, including signs / symptoms of cellulitis infection with modified independence using LE Workbook as printed reference to identify 6 precautions without verbal cues by end of 3rd  OT Rx visit.     Baseline  no knowledge    Time  12    Period  Weeks    Status  On-going    Target Date  12/29/18      OT LONG TERM GOAL #3   Title  Pt to achieve no less than 10% limb volume reduction in affected limb(s) during Intensive Phase CDT to control limb swelling, to improve tissue integrity and immune function, to improve ADLs performance and to improve  functional mobility/ transfers.    Baseline  dependent    Time  12    Period  Weeks    Status  On-going    Target Date  12/29/18      OT LONG TERM GOAL #4   Title  Pt will achieve 100% compliance with daily LE self-care home program components, including proper skin care, simple self-MLD, gradient compression wraps/ garments, and therapeutic exercise to ensure optimal Intensive Phase limb volume reduction to expedite compression garment/ device fitting.    Baseline  Max A    Time  12    Period  Weeks    Status  On-going    Target Date  12/29/18  OT LONG TERM GOAL #5   Title  Pt will demonstrate competent use of assistive devices during LE self-care training to achieve pain free dressing, bathing and grooming .    Baseline  Max A    Time  12    Period  Weeks    Status  Revised    Target Date  12/29/18      OT LONG TERM GOAL #6   Title  Pt will retain optimal limb volume reductions achieved during Intensive Phase CDT with no more than 3% volume increase  without CG assistance to limit LE progression and further functional decline.    Baseline  Dependent    Time  6    Period  Months    Status  On-going            Plan - 10/11/18 1609    Clinical Impression Statement  Provided MLD as established utilizing AAA, AI and deep abdominal pathways and intercostal lymphatics. Also provided stimulation to subclavian LN w/ "handwashing " strokes to shoulder. LEU remains mildly swollen with greatest density of high protien edema in forarm. Arm is decongested more today than last  visit and skin wrinkles are noted at area adjacent to axilla where skiin was so hard and dense. Pt tolerated treatment. Applied extra cotton pad over dressing to catch wound leakage and added 8" wide ace wrap w/ gentle compression to the chest and lateral trunk. Pt will call PRN if he needs assistance  during holiday break. Otherwise we'll see Mr Sedivy back on Jan 6th.    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    OT Frequency  3x / week    OT Duration  12 weeks   and PRN   OT Treatment/Interventions  Self-care/ADL training;Therapeutic exercise;Manual lymph drainage;Compression bandaging;Patient/family education;Other (comment);Energy conservation;Therapeutic activities;Balance training;DME and/or AE instruction;Manual Therapy;Passive range of motion   Assist Pt with modification of TShirts with velcro cosures at shoulder and side seams to reduce pain     when dressing. Provide additional assistive device resources PRN.   Clinical Decision Making  Multiple treatment options, significant modification of task necessary    Recommended Other Services  fit with custom, clat knit LUE compression sleeve and HOS device. Consider seamless compression glove and vest. Consider kinesiotape during Intensive Phase    Consulted and Agree with Plan of Care  Patient       Patient will benefit from skilled therapeutic intervention in order to improve the following deficits and impairments:  Decreased endurance, Decreased mobility, Decreased skin integrity, Impaired sensation, Decreased knowledge of precautions, Decreased range of motion, Decreased scar mobility, Increased edema, Pain, Decreased activity tolerance, Decreased knowledge of use of DME, Decreased strength, Impaired flexibility, Impaired UE functional use, Decreased psychosocial skills, Decreased balance, Other (comment)(balance and gait negatively impacted by guarding   posture , impaired armswing and body asymmetry)  Visit Diagnosis: Lymphedema, not elsewhere classified    Problem List Patient Active Problem List   Diagnosis Date Noted  . Localized tissue death ([place])   . 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 Open wound of left axillary region with complication 63/84/5364  . Anemia 05/10/2018  . Ileus ([place])   . Constipation 10/10/2017  . Metastatic melanoma ([place]) 07/07/2017  . Axillary mass, left 06/26/2017  . Chronic pain of left knee 12/14/2015  . Influenza vaccination declined 11/27/2015    Andrey Spearman, MS, OTR/L, [facility] 10/11/18 4:14 PM  [place] MAIN [facility] SERVICES [address] [place], [place], [address] Phone: [phone]   Fax:  [phone]  Name: Louis Davies MRN: [id number] Date of Birth: 07/16/1951

## 2018-10-16 DIAGNOSIS — Z923 Personal history of irradiation: Secondary | ICD-10-CM | POA: Diagnosis not present

## 2018-10-16 DIAGNOSIS — D509 Iron deficiency anemia, unspecified: Secondary | ICD-10-CM | POA: Diagnosis not present

## 2018-10-16 DIAGNOSIS — L03112 Cellulitis of left axilla: Secondary | ICD-10-CM | POA: Diagnosis not present

## 2018-10-16 DIAGNOSIS — G893 Neoplasm related pain (acute) (chronic): Secondary | ICD-10-CM | POA: Diagnosis not present

## 2018-10-16 DIAGNOSIS — C799 Secondary malignant neoplasm of unspecified site: Secondary | ICD-10-CM | POA: Diagnosis not present

## 2018-10-16 DIAGNOSIS — C439 Malignant melanoma of skin, unspecified: Secondary | ICD-10-CM | POA: Diagnosis not present

## 2018-10-16 DIAGNOSIS — E43 Unspecified severe protein-calorie malnutrition: Secondary | ICD-10-CM | POA: Diagnosis not present

## 2018-10-16 DIAGNOSIS — R918 Other nonspecific abnormal finding of lung field: Secondary | ICD-10-CM | POA: Diagnosis not present

## 2018-10-16 DIAGNOSIS — Y838 Other surgical procedures as the cause of abnormal reaction of the patient, or of later complication, without mention of misadventure at the time of the procedure: Secondary | ICD-10-CM | POA: Diagnosis not present

## 2018-10-16 DIAGNOSIS — C773 Secondary and unspecified malignant neoplasm of axilla and upper limb lymph nodes: Secondary | ICD-10-CM | POA: Diagnosis not present

## 2018-10-16 DIAGNOSIS — T8149XA Infection following a procedure, other surgical site, initial encounter: Secondary | ICD-10-CM | POA: Diagnosis not present

## 2018-10-16 DIAGNOSIS — I890 Lymphedema, not elsewhere classified: Secondary | ICD-10-CM | POA: Diagnosis not present

## 2018-10-17 DIAGNOSIS — E440 Moderate protein-calorie malnutrition: Secondary | ICD-10-CM | POA: Diagnosis not present

## 2018-10-17 DIAGNOSIS — T402X5A Adverse effect of other opioids, initial encounter: Secondary | ICD-10-CM | POA: Diagnosis not present

## 2018-10-17 DIAGNOSIS — G893 Neoplasm related pain (acute) (chronic): Secondary | ICD-10-CM | POA: Diagnosis present

## 2018-10-17 DIAGNOSIS — C773 Secondary and unspecified malignant neoplasm of axilla and upper limb lymph nodes: Secondary | ICD-10-CM | POA: Diagnosis present

## 2018-10-17 DIAGNOSIS — R2232 Localized swelling, mass and lump, left upper limb: Secondary | ICD-10-CM | POA: Diagnosis not present

## 2018-10-17 DIAGNOSIS — D619 Aplastic anemia, unspecified: Secondary | ICD-10-CM | POA: Diagnosis not present

## 2018-10-17 DIAGNOSIS — L03112 Cellulitis of left axilla: Secondary | ICD-10-CM | POA: Diagnosis present

## 2018-10-17 DIAGNOSIS — Z515 Encounter for palliative care: Secondary | ICD-10-CM | POA: Diagnosis not present

## 2018-10-17 DIAGNOSIS — Z85820 Personal history of malignant melanoma of skin: Secondary | ICD-10-CM | POA: Diagnosis not present

## 2018-10-17 DIAGNOSIS — D509 Iron deficiency anemia, unspecified: Secondary | ICD-10-CM | POA: Diagnosis present

## 2018-10-17 DIAGNOSIS — E43 Unspecified severe protein-calorie malnutrition: Secondary | ICD-10-CM | POA: Diagnosis present

## 2018-10-17 DIAGNOSIS — Z923 Personal history of irradiation: Secondary | ICD-10-CM | POA: Diagnosis not present

## 2018-10-17 DIAGNOSIS — K5903 Drug induced constipation: Secondary | ICD-10-CM | POA: Diagnosis not present

## 2018-10-17 DIAGNOSIS — I890 Lymphedema, not elsewhere classified: Secondary | ICD-10-CM | POA: Diagnosis present

## 2018-10-17 DIAGNOSIS — C799 Secondary malignant neoplasm of unspecified site: Secondary | ICD-10-CM | POA: Diagnosis not present

## 2018-10-17 DIAGNOSIS — R911 Solitary pulmonary nodule: Secondary | ICD-10-CM | POA: Diagnosis present

## 2018-10-21 ENCOUNTER — Other Ambulatory Visit: Payer: Self-pay | Admitting: *Deleted

## 2018-10-21 DIAGNOSIS — C439 Malignant melanoma of skin, unspecified: Secondary | ICD-10-CM

## 2018-10-21 DIAGNOSIS — C799 Secondary malignant neoplasm of unspecified site: Secondary | ICD-10-CM

## 2018-10-22 ENCOUNTER — Encounter
Admission: RE | Admit: 2018-10-22 | Discharge: 2018-10-22 | Disposition: A | Discharge status: Home / Self Care | Payer: Medicare Other | Source: Ambulatory Visit | Attending: Oncology | Admitting: Oncology

## 2018-10-22 DIAGNOSIS — C439 Malignant melanoma of skin, unspecified: Secondary | ICD-10-CM

## 2018-10-22 DIAGNOSIS — C799 Secondary malignant neoplasm of unspecified site: Secondary | ICD-10-CM | POA: Diagnosis not present

## 2018-10-22 LAB — GLUCOSE, CAPILLARY: Glucose-Capillary: 79 mg/dL (ref 70–99)

## 2018-10-22 MED ORDER — FLUDEOXYGLUCOSE F - 18 (FDG) INJECTION
7.9000 | Freq: Once | INTRAVENOUS | Status: AC | PRN
  Administered 2018-10-22: 7.82 via INTRAVENOUS

## 2018-10-22 NOTE — Progress Notes (Signed)
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  INTERVAL HISTORY: Patient returns to clinic today for further evaluation, discussion of his imaging results, and treatment planning.  He had a second opinion at [facility] earlier this week.  He continues to have enlarging left axillary mass.  His weakness and fatigue are unchanged.  He continues to have pain.  He has no neurologic complaints. He denies any recent fevers or illnesses. He has no chest pain, cough, or hemoptysis. He denies any nausea, vomiting, constipation, or diarrhea. He has no melena or hematochezia. He has no urinary complaints.  Patient offers no further specific complaints today.  REVIEW OF SYSTEMS:   Review of Systems  Constitutional: Positive for malaise/fatigue. Negative for fever and weight loss.  Respiratory: Negative.  Negative for cough and shortness of breath.   Cardiovascular: Negative.  Negative for chest pain and leg swelling.  Gastrointestinal: Negative.  Negative for abdominal pain, blood in stool, melena, nausea and vomiting.  Genitourinary: Negative.  Negative for dysuria.  Musculoskeletal: Negative.  Negative for myalgias.  Skin: Negative.  Negative for rash.  Neurological: Positive for weakness. Negative for sensory change and focal weakness.  Psychiatric/Behavioral: Negative.  The patient is not nervous/anxious.     As per HPI. Otherwise, a complete review of systems is negative.  PAST MEDICAL HISTORY: Past Medical History:  Diagnosis Date  . Anemia   . Arthritis   . Cancer ([place])    MELANOMA  . Complication of anesthesia   . Dyspnea    doe  . Perforated viscus   . PONV (postoperative nausea and vomiting)     PAST SURGICAL  HISTORY: Past Surgical History:  Procedure Laterality Date  . AXILLARY LYMPH NODE DISSECTION Left 06/26/2017   Procedure: EXCISION OF LEFT AXILLARY MASS;  Surgeon: Johnathan Hausen, MD;  Location: [facility] ORS;  Service: General;  Laterality: Left;  . DIAGNOSTIC LAPAROSCOPY    . EMBOLIZATION Left 05/11/2018   Procedure: EMBOLIZATION;  Surgeon: Katha Cabal, MD;  Location: [place] [facility];  Service: Cardiovascular;  Laterality: Left;  . LAPAROTOMY N/A 10/14/2017   Procedure: EXPLORATORY LAPAROTOMY;  Surgeon: Clayburn Pert, MD;  Location: [facility] ORS;  Service: General;  Laterality: N/A;  . VASECTOMY    . WOUND DEBRIDEMENT Left 09/24/2018   Procedure: LEFT AXILLARY EXCISIONAL DEBRIDEMENT OF NECROTIC TISSUE;  Surgeon: Vickie Epley, MD;  Location: [facility] ORS;  Service: General;  Laterality: Left;    FAMILY HISTORY: Family History  Problem Relation Age of Onset  . Diabetes Mother   . Hypertension Mother   . Leukemia Father   . Thyroid disease Sister   . Liver disease Maternal Grandmother   . Heart Problems Maternal Grandfather   . Diabetes Maternal Grandfather   . Lung cancer Paternal Grandmother   . Prostate cancer Paternal Grandfather     ADVANCED DIRECTIVES (Y/N):  N  HEALTH MAINTENANCE: Social History   Tobacco Use  . Smoking status: Never Smoker  . Smokeless tobacco: Never Used  Substance Use Topics  . Alcohol use: No  . Drug use: No     Colonoscopy:  PAP:  Bone density:  Lipid panel:  No Known Allergies  Current Outpatient Medications  Medication Sig Dispense Refill  . ibuprofen (ADVIL,MOTRIN) 200 MG tablet Take 200 mg  by mouth every 6 (six) hours as needed.    . Multiple Vitamin (MULTIVITAMIN) tablet Take 1 tablet by mouth daily.    Marland Kitchen ZELBORAF 240 MG tablet TAKE 3 TABLETS (720 MG TOTAL) BY MOUTH EVERY 12 HOURS. (Patient taking differently: Take 720 mg by mouth every 12 (twelve) hours. ) 180 tablet 2  . Oxycodone HCl 10 MG TABS Take 1 tablet (10 mg total)  by mouth every 6 (six) hours as needed. 60 tablet 0   No current facility-administered medications for this visit.     OBJECTIVE: Vitals:   10/26/18 1134  BP: 108/70  Pulse: 88  Temp: (!) 97.3 F (36.3 C)     Body mass index is 21.86 kg/m.    ECOG FS:2 - Symptomatic, <50% confined to bed  General: Ill-appearing, no acute distress. Eyes: Pink conjunctiva, anicteric sclera. HEENT: Normocephalic, moist mucous membranes. Lungs: Clear to auscultation bilaterally. Heart: Regular rate and rhythm. No rubs, murmurs, or gallops. Abdomen: Soft, nontender, nondistended. No organomegaly noted, normoactive bowel sounds. Musculoskeletal: No edema, cyanosis, or clubbing. Neuro: Alert, answering all questions appropriately. Cranial nerves grossly intact. Skin: No rashes or petechiae noted. Psych: Normal affect. Lymphatics: Surgical dressing over left axillary mass.    LAB RESULTS:  Lab Results  Component Value Date   NA 139 10/26/2018   K 4.2 10/26/2018   CL 106 10/26/2018   CO2 27 10/26/2018   GLUCOSE 109 (H) 10/26/2018   BUN 18 10/26/2018   CREATININE 1.09 10/26/2018   CALCIUM 8.5 (L) 10/26/2018   PROT 5.7 (L) 10/26/2018   ALBUMIN 2.9 (L) 10/26/2018   AST 16 10/26/2018   ALT 11 10/26/2018   ALKPHOS 43 10/26/2018   BILITOT 0.5 10/26/2018   GFRNONAA >60 10/26/2018   GFRAA >60 10/26/2018    Lab Results  Component Value Date   WBC 6.2 10/26/2018   NEUTROABS 4.8 10/26/2018   HGB 6.1 (L) 10/26/2018   HCT 21.1 (L) 10/26/2018   MCV 73.3 (L) 10/26/2018   PLT 444 (H) 10/26/2018     STUDIES: Nm Pet Image Restage (ps) Whole Body  Result Date: 10/22/2018 CLINICAL DATA:  Subsequent treatment strategy for metastatic melanoma. EXAM: NUCLEAR MEDICINE PET WHOLE BODY TECHNIQUE: 7.82 mCi F-18 FDG was injected intravenously. Full-ring PET imaging was performed from the skull base to thigh after the radiotracer. CT data was obtained and used for attenuation correction and anatomic  localization. Fasting blood glucose: 79 mg/dl COMPARISON:  PET-CT 07/19/2018 FINDINGS: Mediastinal blood pool activity: SUV max 2.31 HEAD/NECK: No neck mass or neck adenopathy. There is hypermetabolism in the left thyroid lobe with SUV max of 7.52. This was also present on the prior study although SUV max was only 4.37. This is likely due to thyroiditis. Incidental CT findings: none CHEST: Large persistent left axillary necrotic soft tissue mass measuring approximately 12.2 x 5.3 cm. It previously measured 7.5 x 6.6 cm. It appears to be grossly eroding through the skin and is now markedly exophytic in the lower axillary region. This portion measures 8.0 x 4.6 cm. SUV max is 17.33 in the non necrotic areas. There was previously 7.29. Moderate activity noted between the scapula and the ribs without discrete mass. This may be some type of inflammatory process. Stable 8 mm right-sided subcarinal lymph node is hypermetabolic with SUV max of 7.90. This was previously 4.58. Small right hilar node is also mildly hypermetabolic with SUV max of 2.40. This was previously 3.28. A few small scattered pulmonary nodules are stable. On the  prior PET-CT there was a focal skin lesion with hypermetabolism involving the upper left posterior chest. This has resolved. Incidental CT findings: none ABDOMEN/PELVIS: No abnormal hypermetabolic activity within the liver, pancreas, adrenal glands, or spleen. No hypermetabolic lymph nodes in the abdomen or pelvis. Incidental CT findings: none SKELETON: No focal hypermetabolic activity to suggest skeletal metastasis. Incidental CT findings: none EXTREMITIES: No abnormal hypermetabolic activity in the lower extremities. Incidental CT findings: none IMPRESSION: 1. Enlarging left axillary necrotic mass with a much bigger exophytic portion in the lower axilla. SUV max has increased from 7.29 to 17.33. 2. Stable small right hilar and right subcarinal lymph nodes with mild hypermetabolism. 3. A few  small scattered pulmonary nodules are stable. No new or progressive findings. 4. No abdominal/pelvic metastatic disease or osseous metastatic disease. Electronically Signed   By: Marijo Sanes M.D.   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  PLAN:    1. 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  [place] input.  Patient will see surgery on Thursday for palliative debridement.  Plan to switch treatment to combination therapy using encorafenib/binimetinib.  Patient expressed understanding that his treatment options are limited.  Return to clinic in 2 weeks for repeat laboratory work and further evaluation.   2.  Left axilla: Appreciate [facility] and surgical input.  Follow-up with surgery as indicated. 3.  Left arm swelling: Significantly improved.  Continue follow-up with lymphedema clinic. 4.  Poor appetite: Improved.  Continue Megace as prescribed.  Appreciate dietary input. 5.  Anemia: Patient's hemoglobin has significantly decreased and will return to clinic on Thursday for 2 units of packed red blood cells.  Patient expressed understanding and was in agreement with this plan. He also understands that He can call clinic at any time with any questions, concerns, or complaints.   Cancer Staging Metastatic melanoma [facility]) Staging form: Melanoma of the Skin, AJCC 8th Edition - Clinical stage from 07/15/2017: Stage IV (cTX, cN1, cM1c(0)) - Signed by Lloyd Huger, MD on 07/15/2017   Lloyd Huger, MD   10/29/2018 9:58 AM

## 2018-10-25 ENCOUNTER — Encounter: Payer: Medicare Other | Admitting: Occupational Therapy

## 2018-10-25 DIAGNOSIS — Z79899 Other long term (current) drug therapy: Secondary | ICD-10-CM | POA: Diagnosis not present

## 2018-10-25 DIAGNOSIS — C773 Secondary and unspecified malignant neoplasm of axilla and upper limb lymph nodes: Secondary | ICD-10-CM | POA: Diagnosis not present

## 2018-10-25 DIAGNOSIS — C799 Secondary malignant neoplasm of unspecified site: Secondary | ICD-10-CM | POA: Diagnosis not present

## 2018-10-25 DIAGNOSIS — G893 Neoplasm related pain (acute) (chronic): Secondary | ICD-10-CM | POA: Diagnosis not present

## 2018-10-26 ENCOUNTER — Other Ambulatory Visit: Payer: Self-pay

## 2018-10-26 ENCOUNTER — Inpatient Hospital Stay (HOSPITAL_BASED_OUTPATIENT_CLINIC_OR_DEPARTMENT_OTHER): Payer: Medicare Other | Admitting: Oncology

## 2018-10-26 ENCOUNTER — Encounter: Payer: Medicare Other | Admitting: Occupational Therapy

## 2018-10-26 ENCOUNTER — Other Ambulatory Visit: Payer: Self-pay | Admitting: *Deleted

## 2018-10-26 ENCOUNTER — Inpatient Hospital Stay: Payer: Medicare Other | Attending: Oncology

## 2018-10-26 VITALS — BP 108/70 | HR 88 | Temp 97.3°F | Ht 71.0 in | Wt 156.7 lb

## 2018-10-26 DIAGNOSIS — N400 Enlarged prostate without lower urinary tract symptoms: Secondary | ICD-10-CM

## 2018-10-26 DIAGNOSIS — C799 Secondary malignant neoplasm of unspecified site: Secondary | ICD-10-CM

## 2018-10-26 DIAGNOSIS — R5383 Other fatigue: Secondary | ICD-10-CM | POA: Diagnosis not present

## 2018-10-26 DIAGNOSIS — Z801 Family history of malignant neoplasm of trachea, bronchus and lung: Secondary | ICD-10-CM | POA: Diagnosis not present

## 2018-10-26 DIAGNOSIS — R0602 Shortness of breath: Secondary | ICD-10-CM | POA: Insufficient documentation

## 2018-10-26 DIAGNOSIS — C7951 Secondary malignant neoplasm of bone: Secondary | ICD-10-CM | POA: Insufficient documentation

## 2018-10-26 DIAGNOSIS — Z515 Encounter for palliative care: Secondary | ICD-10-CM | POA: Diagnosis not present

## 2018-10-26 DIAGNOSIS — Z923 Personal history of irradiation: Secondary | ICD-10-CM | POA: Diagnosis not present

## 2018-10-26 DIAGNOSIS — R531 Weakness: Secondary | ICD-10-CM | POA: Diagnosis not present

## 2018-10-26 DIAGNOSIS — C781 Secondary malignant neoplasm of mediastinum: Secondary | ICD-10-CM | POA: Diagnosis not present

## 2018-10-26 DIAGNOSIS — Z8042 Family history of malignant neoplasm of prostate: Secondary | ICD-10-CM | POA: Insufficient documentation

## 2018-10-26 DIAGNOSIS — I10 Essential (primary) hypertension: Secondary | ICD-10-CM

## 2018-10-26 DIAGNOSIS — M129 Arthropathy, unspecified: Secondary | ICD-10-CM | POA: Diagnosis not present

## 2018-10-26 DIAGNOSIS — C439 Malignant melanoma of skin, unspecified: Secondary | ICD-10-CM | POA: Insufficient documentation

## 2018-10-26 DIAGNOSIS — Z800 Family history of malignant neoplasm of digestive organs: Secondary | ICD-10-CM | POA: Diagnosis not present

## 2018-10-26 DIAGNOSIS — J069 Acute upper respiratory infection, unspecified: Secondary | ICD-10-CM

## 2018-10-26 DIAGNOSIS — C9110 Chronic lymphocytic leukemia of B-cell type not having achieved remission: Secondary | ICD-10-CM | POA: Diagnosis not present

## 2018-10-26 DIAGNOSIS — F419 Anxiety disorder, unspecified: Secondary | ICD-10-CM | POA: Diagnosis not present

## 2018-10-26 DIAGNOSIS — R918 Other nonspecific abnormal finding of lung field: Secondary | ICD-10-CM | POA: Diagnosis not present

## 2018-10-26 DIAGNOSIS — Z7982 Long term (current) use of aspirin: Secondary | ICD-10-CM | POA: Diagnosis not present

## 2018-10-26 DIAGNOSIS — C773 Secondary and unspecified malignant neoplasm of axilla and upper limb lymph nodes: Secondary | ICD-10-CM | POA: Diagnosis not present

## 2018-10-26 DIAGNOSIS — Z79899 Other long term (current) drug therapy: Secondary | ICD-10-CM | POA: Insufficient documentation

## 2018-10-26 DIAGNOSIS — I2510 Atherosclerotic heart disease of native coronary artery without angina pectoris: Secondary | ICD-10-CM

## 2018-10-26 DIAGNOSIS — Z1509 Genetic susceptibility to other malignant neoplasm: Secondary | ICD-10-CM | POA: Insufficient documentation

## 2018-10-26 DIAGNOSIS — Z9221 Personal history of antineoplastic chemotherapy: Secondary | ICD-10-CM | POA: Diagnosis not present

## 2018-10-26 DIAGNOSIS — G893 Neoplasm related pain (acute) (chronic): Secondary | ICD-10-CM | POA: Insufficient documentation

## 2018-10-26 DIAGNOSIS — D649 Anemia, unspecified: Secondary | ICD-10-CM | POA: Diagnosis not present

## 2018-10-26 LAB — CBC WITH DIFFERENTIAL/PLATELET
Abs Immature Granulocytes: 0.03 10*3/uL (ref 0.00–0.07)
Basophils Absolute: 0 10*3/uL (ref 0.0–0.1)
Basophils Relative: 1 %
Eosinophils Absolute: 0.2 10*3/uL (ref 0.0–0.5)
Eosinophils Relative: 3 %
HCT: 21.1 % — ABNORMAL LOW (ref 39.0–52.0)
Hemoglobin: 6.1 g/dL — ABNORMAL LOW (ref 13.0–17.0)
IMMATURE GRANULOCYTES: 1 %
Lymphocytes Relative: 10 %
Lymphs Abs: 0.6 10*3/uL — ABNORMAL LOW (ref 0.7–4.0)
MCH: 21.2 pg — ABNORMAL LOW (ref 26.0–34.0)
MCHC: 28.9 g/dL — ABNORMAL LOW (ref 30.0–36.0)
MCV: 73.3 fL — AB (ref 80.0–100.0)
Monocytes Absolute: 0.5 10*3/uL (ref 0.1–1.0)
Monocytes Relative: 8 %
NRBC: 0 % (ref 0.0–0.2)
Neutro Abs: 4.8 10*3/uL (ref 1.7–7.7)
Neutrophils Relative %: 77 %
Platelets: 444 10*3/uL — ABNORMAL HIGH (ref 150–400)
RBC: 2.88 MIL/uL — ABNORMAL LOW (ref 4.22–5.81)
RDW: 20.1 % — ABNORMAL HIGH (ref 11.5–15.5)
WBC: 6.2 10*3/uL (ref 4.0–10.5)

## 2018-10-26 LAB — COMPREHENSIVE METABOLIC PANEL
ALT: 11 U/L (ref 0–44)
AST: 16 U/L (ref 15–41)
Albumin: 2.9 g/dL — ABNORMAL LOW (ref 3.5–5.0)
Alkaline Phosphatase: 43 U/L (ref 38–126)
Anion gap: 6 (ref 5–15)
BUN: 18 mg/dL (ref 8–23)
CO2: 27 mmol/L (ref 22–32)
CREATININE: 1.09 mg/dL (ref 0.61–1.24)
Calcium: 8.5 mg/dL — ABNORMAL LOW (ref 8.9–10.3)
Chloride: 106 mmol/L (ref 98–111)
Glucose, Bld: 109 mg/dL — ABNORMAL HIGH (ref 70–99)
Potassium: 4.2 mmol/L (ref 3.5–5.1)
Sodium: 139 mmol/L (ref 135–145)
Total Bilirubin: 0.5 mg/dL (ref 0.3–1.2)
Total Protein: 5.7 g/dL — ABNORMAL LOW (ref 6.5–8.1)

## 2018-10-26 LAB — SAMPLE TO BLOOD BANK

## 2018-10-26 LAB — PHOSPHORUS: Phosphorus: 4.1 mg/dL (ref 2.5–4.6)

## 2018-10-26 LAB — MAGNESIUM: Magnesium: 2.2 mg/dL (ref 1.7–2.4)

## 2018-10-26 MED ORDER — OXYCODONE HCL 10 MG PO TABS: 10.0000 mg | ORAL_TABLET | Freq: Four times a day (QID) | ORAL | 0 refills | Status: DC | PRN

## 2018-10-26 NOTE — Progress Notes (Signed)
Patient is here to follow up on his Metastatic melanoma. Patient stated that a few days ago he lost conscious and fell. He stated that it could of been due to the extreme pain he was having at that time. Ever since, he had been weak and tired. Patient stated that his "wound ruptured". Therefore, he was taken to [facility]. Patient stated that he wants to discuss his visit to [facility]. Patient stated that [facility] recommended to be seen by Palliative care.

## 2018-10-27 ENCOUNTER — Ambulatory Visit: Payer: Medicare Other | Attending: Oncology | Admitting: Occupational Therapy

## 2018-10-27 DIAGNOSIS — I890 Lymphedema, not elsewhere classified: Secondary | ICD-10-CM | POA: Diagnosis not present

## 2018-10-27 LAB — THYROID PANEL WITH TSH
Free Thyroxine Index: 1.4 (ref 1.2–4.9)
T3 Uptake Ratio: 29 % (ref 24–39)
T4, Total: 4.9 ug/dL (ref 4.5–12.0)
TSH: 5.95 u[IU]/mL — ABNORMAL HIGH (ref 0.450–4.500)

## 2018-10-27 LAB — PREPARE RBC (CROSSMATCH)

## 2018-10-27 NOTE — Therapy (Signed)
[place] MAIN [facility] SERVICES [address] [place], [place], [address] Phone: [phone]   Fax:  [phone]  Occupational Therapy Treatment  Patient Details  Name: Louis Davies MRN: [id number] Date of Birth: [birth year] No data recorded  Encounter Date: 10/27/2018  OT End of Session - 10/27/18 1100    Visit Number  29    Number of Visits  36    Date for OT Re-Evaluation  01/02/19    Authorization Type  initial cert ended 24/0/97. initiated . NEW CERT submitted on 35/32/9924.  10/27/2018:  29th visit  since commensing OT for LE care. Visit 1/36 for new year.     Authorization Time Period  cont OT 2-3 x weekly x 12 weeks/ 36 visits to address LUE/LUQ secondary lymphedema and to facilitate wound healing     Authorization - Visit Number  1    Authorization - Number of Visits  76    OT Start Time  0809    OT Stop Time  0915    OT Time Calculation (min)  66 min    Equipment Utilized During Treatment  kinesiotex 2"    Activity Tolerance  Patient tolerated treatment well;Patient limited by pain    Behavior During Therapy  WFL for tasks assessed/performed       Past Medical History:  Diagnosis Date  . Anemia   . Arthritis   . Cancer ([place])    MELANOMA  . Complication of anesthesia   . Dyspnea    doe  . Perforated viscus   . PONV (postoperative nausea and vomiting)     Past Surgical History:  Procedure Laterality Date  . AXILLARY LYMPH NODE DISSECTION Left 06/26/2017   Procedure: EXCISION OF LEFT AXILLARY MASS;  Surgeon: Johnathan Hausen, MD;  Location: [facility] ORS;  Service: General;  Laterality: Left;  . DIAGNOSTIC LAPAROSCOPY    . EMBOLIZATION Left 05/11/2018   Procedure: EMBOLIZATION;  Surgeon: Katha Cabal, MD;  Location: [place] [facility];  Service: Cardiovascular;  Laterality: Left;  . LAPAROTOMY N/A 10/14/2017   Procedure: EXPLORATORY LAPAROTOMY;  Surgeon: Clayburn Pert, MD;  Location: [facility] ORS;  Service: General;   Laterality: N/A;  . VASECTOMY    . WOUND DEBRIDEMENT Left 09/24/2018   Procedure: LEFT AXILLARY EXCISIONAL DEBRIDEMENT OF NECROTIC TISSUE;  Surgeon: Vickie Epley, MD;  Location: [facility] ORS;  Service: General;  Laterality: Left;    There were no vitals filed for this visit.  Subjective Assessment - 10/27/18 1102    Subjective   [name] presents for OT treatment visit 29/36 to address LUE/ LUQ lymphedema 2/2 stage 4 melanoma.  In keeping with Medicare cap for OT services, today's visit is 1/36 for new Medicare calendar year. Pt reports he saw [facility] team for 2nd opinion, and he was admitted for care. Pt reorts team at [facility] referred him back to [facility] team as further surgiery and/ or  XRT are contraindicated. Pt states MLD and compression continue to provide symptom relief, including analgesic effect, for LUE/LUQ LE.    Limitations   LUE/ LUQ pain, swelling and odor from wound contribute to impaired basic and instrumental ADLs, limited participation in leisure pursuits and  work/ productive activities, impaired social participation, impaired role performance, impaired body image                    OT Treatments/Exercises (OP) - 10/27/18 0001      ADLs   ADL Education Given  Yes  Manual Therapy   Manual Therapy  Edema management;Compression Bandaging;Manual Lymphatic Drainage (MLD)    Manual Lymphatic Drainage (MLD)  Provided MLD in seated position initially w/ LUE suppored on hi lo bed in effort to access both anterior and posterior axillary anasttamoses and ipsilaeral axillary-inguinal pathways in more comfortable and accessible position. Pt tolerated seated positioning very well. Provided LUE strokes to extremity and shoulder in supine. Pt tolerated this set u bettter as it eliminated the need for bed mobility and sidelying during treatment. Pt reported releif from symptoms at trunkal area and extremity after manual treatment.    Compression Bandaging  Pt donned sleeve  independently after session. Pt required assistance to don upper body clothing.             OT Education - 10/27/18 1107    Education Details  Pt edu for gentle, gravity eliminated L shoulder and elbow AROM with arm supported on bed in effort to retain joint mobility and to activate muscle pump in limb  for lymphatic mobility.    Person(s) Educated  Patient    Methods  Explanation;Demonstration;Handout;Tactile cues;Verbal cues    Comprehension  Verbalized understanding;Need further instruction;Returned demonstration;Verbal cues required;Tactile cues required          OT Long Term Goals - 09/30/18 1720      OT LONG TERM GOAL #1   Title  Pt will be able to apply multi-layer short stretch compression wraps using correct gradient techniques with independently , but with extra time  to achieve optimal limb volume reduction during Intensive Phase CDT, and to return affected limb(s) , as closely as possible, to premorbid size and shape.    Baseline  Dependent    Time  2    Period  Days    Status  Achieved      OT LONG TERM GOAL #2   Title  Pt will demonstrate understanding of lymphedema (LE) precautions / prevention principals, including signs / symptoms of cellulitis infection with modified independence using LE Workbook as printed reference to identify 6 precautions without verbal cues by end of 3rd  OT Rx visit.     Baseline  no knowledge    Time  12    Period  Weeks    Status  On-going    Target Date  12/29/18      OT LONG TERM GOAL #3   Title  Pt to achieve no less than 10% limb volume reduction in affected limb(s) during Intensive Phase CDT to control limb swelling, to improve tissue integrity and immune function, to improve ADLs performance and to improve functional mobility/ transfers.    Baseline  dependent    Time  12    Period  Weeks    Status  On-going    Target Date  12/29/18      OT LONG TERM GOAL #4   Title  Pt will achieve 100% compliance with daily LE  self-care home program components, including proper skin care, simple self-MLD, gradient compression wraps/ garments, and therapeutic exercise to ensure optimal Intensive Phase limb volume reduction to expedite compression garment/ device fitting.    Baseline  Max A    Time  12    Period  Weeks    Status  On-going    Target Date  12/29/18      OT LONG TERM GOAL #5   Title  Pt will demonstrate competent use of assistive devices during LE self-care training to achieve pain free dressing, bathing and grooming .  Baseline  Max A    Time  12    Period  Weeks    Status  Revised    Target Date  12/29/18      OT LONG TERM GOAL #6   Title  Pt will retain optimal limb volume reductions achieved during Intensive Phase CDT with no more than 3% volume increase  without CG assistance to limit LE progression and further functional decline.    Baseline  Dependent    Time  6    Period  Months    Status  On-going            Plan - 10/27/18 1114    Clinical Impression Statement  Provided MLD in seated position initially w/ LUE suppored on hi lo bed in effort to access both anterior and posterior axillary anastamoses and ipsilaeral axillary-inguinal pathways in more comfortable and accessible position. Pt tolerated seated positioning very well. Provided LUE strokes to extremity and shoulder in supine. Pt tolerated this set u bettter as it eliminated the need for bed mobility and sidelying during treatment. Pt reported analgesic effect after manual treatment. Pt will continue to benefit from lymphedema care to manage  physical symptoms of lymphedema, swelling in the LUE/LUQ,, to  limit cellulitis risk, and to provide pain relief in advanced stage cancer. Pt agrees with plan to continue OT 2-3 x weekly, and adjust schedule PRN dependent on burden of care.    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    OT Frequency  3x / week    OT Duration  Other (comment)   ongoing as palliative measure 2/2 advanced cancer and worsening LE w/ functional decline   OT Treatment/Interventions  Self-care/ADL training;Therapeutic exercise;Manual lymph drainage;Compression bandaging;Patient/family education;Other (comment);Energy conservation;Therapeutic activities;Balance training;DME and/or AE instruction;Manual Therapy;Passive range of motion;Coping strategies training   Assist Pt with modification of TShirts with velcro cosures at shoulder and side seams to reduce pain     when dressing. Provide additional assistive device resources PRN.   Clinical Decision Making  Multiple treatment options, significant modification of task necessary    Recommended Other Services  fit with custom, clat knit LUE compression sleeve and HOS device. Consider seamless compression glove and vest. Consider kinesiotape during Intensive Phase    Consulted and Agree with Plan of Care  Patient       Patient will benefit from skilled therapeutic intervention in order to improve the following deficits and impairments:  Decreased endurance, Decreased mobility, Decreased skin integrity, Impaired sensation, Decreased knowledge of precautions, Decreased range of motion, Decreased scar mobility, Increased edema, Pain, Decreased activity tolerance, Decreased knowledge of use of DME, Decreased strength, Impaired flexibility, Impaired UE functional use, Decreased psychosocial skills, Decreased balance, Other (comment)(balance and gait negatively impacted by  guarding  posture , impaired armswing and body asymmetry)  Visit Diagnosis: Lymphedema, not elsewhere classified    Problem List Patient Active Problem List   Diagnosis Date Noted  . 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 Malignant melanoma of left upper extremity including shoulder ([place])   . Open wound of left axillary  region with complication 39/68/8648  . Anemia 05/10/2018  . Ileus ([place])   . Constipation 10/10/2017  . Metastatic melanoma ([place]) 07/07/2017  . Axillary mass, left 06/26/2017  . Chronic pain of left knee 12/14/2015  . Influenza vaccination declined 11/27/2015    Andrey Spearman, MS, OTR/L, [facility] 10/27/18 11:28 AM   [place] MAIN [facility] SERVICES [address] [place], [place], [address] Phone: [phone]   Fax:  [phone]  Name: FOYE DAMRON MRN: [id number] Date of Birth: [birth year]

## 2018-10-28 ENCOUNTER — Inpatient Hospital Stay: Payer: Medicare Other

## 2018-10-28 ENCOUNTER — Other Ambulatory Visit: Payer: Self-pay | Admitting: Oncology

## 2018-10-28 ENCOUNTER — Telehealth: Payer: Self-pay | Admitting: Pharmacy Technician

## 2018-10-28 ENCOUNTER — Encounter: Payer: Medicare Other | Admitting: Occupational Therapy

## 2018-10-28 ENCOUNTER — Telehealth: Payer: Self-pay | Admitting: Pharmacist

## 2018-10-28 ENCOUNTER — Encounter: Payer: Self-pay | Admitting: *Deleted

## 2018-10-28 ENCOUNTER — Other Ambulatory Visit: Payer: Self-pay

## 2018-10-28 ENCOUNTER — Ambulatory Visit (INDEPENDENT_AMBULATORY_CARE_PROVIDER_SITE_OTHER): Payer: Medicare Other | Admitting: Surgery

## 2018-10-28 ENCOUNTER — Encounter: Payer: Self-pay | Admitting: Surgery

## 2018-10-28 ENCOUNTER — Other Ambulatory Visit: Payer: Self-pay | Admitting: *Deleted

## 2018-10-28 VITALS — BP 113/69 | HR 77 | Temp 97.7°F | Ht 69.0 in | Wt 157.0 lb

## 2018-10-28 DIAGNOSIS — D649 Anemia, unspecified: Secondary | ICD-10-CM

## 2018-10-28 DIAGNOSIS — C7951 Secondary malignant neoplasm of bone: Secondary | ICD-10-CM | POA: Diagnosis not present

## 2018-10-28 DIAGNOSIS — C4362 Malignant melanoma of left upper limb, including shoulder: Secondary | ICD-10-CM

## 2018-10-28 DIAGNOSIS — C439 Malignant melanoma of skin, unspecified: Secondary | ICD-10-CM

## 2018-10-28 DIAGNOSIS — R918 Other nonspecific abnormal finding of lung field: Secondary | ICD-10-CM | POA: Diagnosis not present

## 2018-10-28 DIAGNOSIS — C781 Secondary malignant neoplasm of mediastinum: Secondary | ICD-10-CM | POA: Diagnosis not present

## 2018-10-28 DIAGNOSIS — C799 Secondary malignant neoplasm of unspecified site: Secondary | ICD-10-CM

## 2018-10-28 DIAGNOSIS — Z515 Encounter for palliative care: Secondary | ICD-10-CM | POA: Diagnosis not present

## 2018-10-28 DIAGNOSIS — C773 Secondary and unspecified malignant neoplasm of axilla and upper limb lymph nodes: Secondary | ICD-10-CM | POA: Diagnosis not present

## 2018-10-28 MED ORDER — SODIUM CHLORIDE 0.9% IV SOLUTION
250.0000 mL | Freq: Once | INTRAVENOUS | Status: AC
  Administered 2018-10-28: 250 mL via INTRAVENOUS
  Filled 2018-10-28: qty 250

## 2018-10-28 MED ORDER — ACETAMINOPHEN 325 MG PO TABS
650.0000 mg | ORAL_TABLET | Freq: Once | ORAL | Status: AC
  Administered 2018-10-28: 650 mg via ORAL
  Filled 2018-10-28: qty 2

## 2018-10-28 MED ORDER — DIPHENHYDRAMINE HCL 50 MG/ML IJ SOLN
25.0000 mg | Freq: Once | INTRAMUSCULAR | Status: AC
  Administered 2018-10-28: 25 mg via INTRAVENOUS
  Filled 2018-10-28: qty 1

## 2018-10-28 NOTE — Progress Notes (Signed)
Surgical Clinic Progress/Follow-up Note   HPI:  68 y.o. Male presents to clinic for follow-up evaluation of his Left arm wound. Patient reports the former mass which was previously excised and debrided for palliation has returned larger than previously and has again been bleeding (for which he's required transfusion this morning) and has again been restricting his LUE range of motion. He also recently underwent repeat PET-CT, denies fever/chills, N/V, diarrhea, constipation, CP, or SOB.  Review of Systems:  Constitutional: denies any other weight loss, fever, chills, or sweats  Eyes: denies any other vision changes, history of eye injury  ENT: denies sore throat, hearing problems  Respiratory: denies shortness of breath, wheezing  Cardiovascular: denies chest pain, palpitations  Gastrointestinal: denies abdominal pain, N/V, or diarrhea Musculoskeletal: denies any other joint pains or cramps  Skin: Denies any other rashes or skin discolorations except as per interval history Neurological: denies any other headache, dizziness, weakness  Psychiatric: denies any other depression, anxiety  All other review of systems: otherwise negative   Vital Signs:  BP 113/69   Pulse 77   Temp 97.7 F (36.5 C) (Skin)   Ht 5\' 9"  (1.753 m)   Wt 157 lb (71.2 kg)   SpO2 98%   BMI 23.18 kg/m    Physical Exam:  Constitutional:  -- Normal body habitus  -- Awake, alert, and oriented x3  Eyes:  -- Pupils equally round and reactive to light  -- No scleral icterus  Ear, nose, throat:  -- No jugular venous distension  -- No nasal drainage, bleeding Pulmonary:  -- No crackles -- Equal breath sounds bilaterally -- Breathing non-labored at rest Cardiovascular:  -- S1, S2 present  -- No pericardial rubs  Gastrointestinal:  -- Soft, nontender, non-distended, no guarding/rebound  -- No abdominal masses appreciated, pulsatile or otherwise  Musculoskeletal / Integumentary:  -- Wounds or skin  discoloration: very large (>10 cm) and friable oozing and necrotic malodorous non-tender to palpation Left axillary mass without surrounding erythema or purulent drainage  -- Extremities: B/L UE and LE FROM, hands and feet warm, +LUE lymphedema  Neurologic:  -- Motor function: intact and symmetric  -- Sensation: intact and symmetric   Laboratory studies:  CBC Latest Ref Rng & Units 10/26/2018 10/06/2018 09/25/2018  WBC 4.0 - 10.5 K/uL 6.2 6.4 15.0(H)  Hemoglobin 13.0 - 17.0 g/dL 6.1(L) 9.2(L) 8.6(L)  Hematocrit 39.0 - 52.0 % 21.1(L) 31.4(L) 28.4(L)  Platelets 150 - 400 K/uL 444(H) 336 310   CMP Latest Ref Rng & Units 10/26/2018 10/06/2018 09/25/2018  Glucose 70 - 99 mg/dL 109(H) 112(H) 124(H)  BUN 8 - 23 mg/dL 18 18 13   Creatinine 0.61 - 1.24 mg/dL 1.09 0.86 0.75  Sodium 135 - 145 mmol/L 139 141 138  Potassium 3.5 - 5.1 mmol/L 4.2 3.6 3.9  Chloride 98 - 111 mmol/L 106 104 106  CO2 22 - 32 mmol/L 27 26 25   Calcium 8.9 - 10.3 mg/dL 8.5(L) 8.6(L) 7.8(L)  Total Protein 6.5 - 8.1 g/dL 5.7(L) 6.6 -  Total Bilirubin 0.3 - 1.2 mg/dL 0.5 0.4 -  Alkaline Phos 38 - 126 U/L 43 43 -  AST 15 - 41 U/L 16 15 -  ALT 0 - 44 U/L 11 13 -   Imaging:  Whole Body PET (10/22/2018) - reviewed and discussed with Dr. Grayland Ormond 1. Enlarging left axillary necrotic mass with a much bigger exophytic portion in the lower axilla. SUV max has increased from 7.29 to 17.33. 2. Stable small right hilar and right  subcarinal lymph nodes with mild hypermetabolism. 3. A few small scattered pulmonary nodules are stable. No new or progressive findings. 4. No abdominal/pelvic metastatic disease or osseous metastatic disease.  Assessment:  68 y.o. yo Male with a problem list including...  Patient Active Problem List   Diagnosis Date Noted  . 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 Open wound of left axillary region with complication 58/83/2549  . Anemia  05/10/2018  . Ileus ([place])   . Constipation 10/10/2017  . Metastatic melanoma ([place]) 07/07/2017  . Axillary mass, left 06/26/2017  . Chronic pain of left knee 12/14/2015  . Influenza vaccination declined 11/27/2015    presents to clinic for follow-up evaluation of increasingly symptomatic and bleeding necrotic Left axillary mass associated with metastatic melanoma with increased Left axillary SUV max on recent PET imaging study s/p prior excision/debridement.  Plan:   - dressing reapplied to relieve tension and absorb drainage   - all risks, benefits, and alternatives to palliative excision and debridement of enlarged bleeding Left axillary mass were again discussed with the patient and his family, all of their questions were answered to their expressed satisfaction, patient expresses he wishes to proceed, and informed consent was obtained.  - will plan for palliative excision and debridement of enlarged bleeding Left axillary mass pending anesthesia and OR availability, will plan to send necrotic specimen for pathology evaluation as discussed with Dr. Grayland Ormond and Dr. Reuel Derby  - anticipate return to clinic 1 week following above procedure  - instructed to call office if any questions or concerns  All of the above recommendations were discussed with the patient and patient's family, and all of patient's and family's questions were answered to their expressed satisfaction.  -- Marilynne Drivers Rosana Hoes, MD, [place]: [facility] - Partnering for exceptional care. Office: [phone]

## 2018-10-28 NOTE — Progress Notes (Signed)
Patient's surgeryto be [facility] for [facility]01-[facility] with Dr. Rosana Hoes.  The patient is aware he will be contacted by the [facility] to complete a phone interview sometime in the near future. *He recently had surgery in 09-2018.  The patient is aware to call the office should he have further questions.

## 2018-10-28 NOTE — Telephone Encounter (Signed)
Oral Oncology Pharmacist Encounter  The plan is to start Mr. Tull on Braftovi/Mektovi (encorafenib/binimetinib) for the treatment of metastatic melanoma, BRAF positive, planned duration until disease progression or unacceptable drug toxicity.  CMP from 10/26/18 assessed, no relevant lab abnormalities. The recommendation is to get an ECHO as baseline, 1 month after initiating treatment, then every 2 to 3 months during treatment to monitor for changes in EF due to the binimetinib.   Current medication list in Epic reviewed, no DDIs with Mektovi or Braftovi identified.  Oral Oncology Clinic will continue to follow for insurance authorization, copayment issues, initial counseling and start date.  Darl Pikes, PharmD, BCPS, [facility] Hematology/Oncology Clinical Pharmacist [facility]/AP Oral [place] [facility] [phone]  10/28/2018 12:30 PM

## 2018-10-28 NOTE — Telephone Encounter (Signed)
Oral Oncology Patient Advocate Encounter  Prior Authorization for Louis Davies has been approved.    PA# [id number] Effective dates: 07/30/18 through 10/27/2021  Patients co-pay is $1076.58  Oral Oncology Clinic will continue to follow.   Oral Oncology Patient Advocate Encounter  Prior Authorization for Louis Davies has been approved.    PA# [id number] Effective dates: 07/30/18 through 10/27/2021  Patients co-pay is $7846.96  Oral Oncology Clinic will continue to follow.   [place] Patient [place] Phone [phone] Fax [phone] 10/29/2018 8:15 AM

## 2018-10-28 NOTE — Telephone Encounter (Signed)
Oral Oncology Patient Advocate Encounter  Received notification from [place] that prior authorization for [facility] is required.  PA submitted on CoverMyMeds Key [id number] Status is pending  Oral Oncology Patient Advocate Encounter  Received notification from [place] that prior authorization for Braftovi is required.  PA submitted on CoverMyMeds Key [id number]  Status is pending  Oral Oncology Clinic will continue to follow.  [place] Patient [place] Phone [phone] Fax [phone] 10/28/2018 4:13 PM

## 2018-10-28 NOTE — H&P (View-Only) (Signed)
Surgical Clinic Progress/Follow-up Note   HPI:  68 y.o. Male presents to clinic for follow-up evaluation of his Left arm wound. Patient reports the former mass which was previously excised and debrided for palliation has returned larger than previously and has again been bleeding (for which he's required transfusion this morning) and has again been restricting his LUE range of motion. He also recently underwent repeat PET-CT, denies fever/chills, N/V, diarrhea, constipation, CP, or SOB.  Review of Systems:  Constitutional: denies any other weight loss, fever, chills, or sweats  Eyes: denies any other vision changes, history of eye injury  ENT: denies sore throat, hearing problems  Respiratory: denies shortness of breath, wheezing  Cardiovascular: denies chest pain, palpitations  Gastrointestinal: denies abdominal pain, N/V, or diarrhea Musculoskeletal: denies any other joint pains or cramps  Skin: Denies any other rashes or skin discolorations except as per interval history Neurological: denies any other headache, dizziness, weakness  Psychiatric: denies any other depression, anxiety  All other review of systems: otherwise negative   Vital Signs:  BP 113/69   Pulse 77   Temp 97.7 F (36.5 C) (Skin)   Ht 5\' 9"  (1.753 m)   Wt 157 lb (71.2 kg)   SpO2 98%   BMI 23.18 kg/m    Physical Exam:  Constitutional:  -- Normal body habitus  -- Awake, alert, and oriented x3  Eyes:  -- Pupils equally round and reactive to light  -- No scleral icterus  Ear, nose, throat:  -- No jugular venous distension  -- No nasal drainage, bleeding Pulmonary:  -- No crackles -- Equal breath sounds bilaterally -- Breathing non-labored at rest Cardiovascular:  -- S1, S2 present  -- No pericardial rubs  Gastrointestinal:  -- Soft, nontender, non-distended, no guarding/rebound  -- No abdominal masses appreciated, pulsatile or otherwise  Musculoskeletal / Integumentary:  -- Wounds or skin  discoloration: very large (>10 cm) and friable oozing and necrotic malodorous non-tender to palpation Left axillary mass without surrounding erythema or purulent drainage  -- Extremities: B/L UE and LE FROM, hands and feet warm, +LUE lymphedema  Neurologic:  -- Motor function: intact and symmetric  -- Sensation: intact and symmetric   Laboratory studies:  CBC Latest Ref Rng & Units 10/26/2018 10/06/2018 09/25/2018  WBC 4.0 - 10.5 K/uL 6.2 6.4 15.0(H)  Hemoglobin 13.0 - 17.0 g/dL 6.1(L) 9.2(L) 8.6(L)  Hematocrit 39.0 - 52.0 % 21.1(L) 31.4(L) 28.4(L)  Platelets 150 - 400 K/uL 444(H) 336 310   CMP Latest Ref Rng & Units 10/26/2018 10/06/2018 09/25/2018  Glucose 70 - 99 mg/dL 109(H) 112(H) 124(H)  BUN 8 - 23 mg/dL 18 18 13   Creatinine 0.61 - 1.24 mg/dL 1.09 0.86 0.75  Sodium 135 - 145 mmol/L 139 141 138  Potassium 3.5 - 5.1 mmol/L 4.2 3.6 3.9  Chloride 98 - 111 mmol/L 106 104 106  CO2 22 - 32 mmol/L 27 26 25   Calcium 8.9 - 10.3 mg/dL 8.5(L) 8.6(L) 7.8(L)  Total Protein 6.5 - 8.1 g/dL 5.7(L) 6.6 -  Total Bilirubin 0.3 - 1.2 mg/dL 0.5 0.4 -  Alkaline Phos 38 - 126 U/L 43 43 -  AST 15 - 41 U/L 16 15 -  ALT 0 - 44 U/L 11 13 -   Imaging:  Whole Body PET (10/22/2018) - reviewed and discussed with Dr. Grayland Ormond 1. Enlarging left axillary necrotic mass with a much bigger exophytic portion in the lower axilla. SUV max has increased from 7.29 to 17.33. 2. Stable small right hilar and right  subcarinal lymph nodes with mild hypermetabolism. 3. A few small scattered pulmonary nodules are stable. No new or progressive findings. 4. No abdominal/pelvic metastatic disease or osseous metastatic disease.  Assessment:  68 y.o. yo Male with a problem list including...  Patient Active Problem List   Diagnosis Date Noted  . Localized tissue death ([place])   . 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 Open wound of left axillary region with complication 60/63/0160  . Anemia  05/10/2018  . Ileus ([place])   . Constipation 10/10/2017  . Metastatic melanoma ([place]) 07/07/2017  . Axillary mass, left 06/26/2017  . Chronic pain of left knee 12/14/2015  . Influenza vaccination declined 11/27/2015    presents to clinic for follow-up evaluation of increasingly symptomatic and bleeding necrotic Left axillary mass associated with metastatic melanoma with increased Left axillary SUV max on recent PET imaging study s/p prior excision/debridement.  Plan:   - dressing reapplied to relieve tension and absorb drainage   - all risks, benefits, and alternatives to palliative excision and debridement of enlarged bleeding Left axillary mass were again discussed with the patient and his family, all of their questions were answered to their expressed satisfaction, patient expresses he wishes to proceed, and informed consent was obtained.  - will plan for palliative excision and debridement of enlarged bleeding Left axillary mass pending anesthesia and OR availability, will plan to send necrotic specimen for pathology evaluation as discussed with Dr. Grayland Ormond and Dr. Reuel Derby  - anticipate return to clinic 1 week following above procedure  - instructed to call office if any questions or concerns  All of the above recommendations were discussed with the patient and patient's family, and all of patient's and family's questions were answered to their expressed satisfaction.  -- Marilynne Drivers Rosana Hoes, MD, [place]: [facility] - Partnering for exceptional care. Office: [phone]

## 2018-10-28 NOTE — Progress Notes (Signed)
NA

## 2018-10-29 ENCOUNTER — Telehealth: Payer: Self-pay | Admitting: Pharmacy Technician

## 2018-10-29 ENCOUNTER — Telehealth: Payer: Self-pay

## 2018-10-29 LAB — BPAM RBC
BLOOD PRODUCT EXPIRATION DATE: 202002012359
BLOOD PRODUCT EXPIRATION DATE: 202002012359
Blood Product Expiration Date: 202001312359
Blood Product Expiration Date: 202001312359
ISSUE DATE / TIME: 202001091045
ISSUE DATE / TIME: 202001091257
ISSUE DATE / TIME: 202001091045
ISSUE DATE / TIME: 202001091257
Unit Type and Rh: 1700
Unit Type and Rh: 1700
Unit Type and Rh: 1700
Unit Type and Rh: 1700

## 2018-10-29 LAB — TYPE AND SCREEN
ABO/RH(D): B NEG
ABO/RH(D): B NEG
Antibody Screen: NEGATIVE
Antibody Screen: NEGATIVE
UNIT DIVISION: 0
Unit division: 0
Unit division: 0
Unit division: 0

## 2018-10-29 NOTE — Telephone Encounter (Signed)
Oral Oncology Patient Advocate Encounter  Patient has been approved for copay assistance with [employer] (TAF).  [employer] [place] will cover all copayment expenses for [facility] & Braftovi for the remainder of the calendar year.    The billing information is as follows and has been shared with [place].   Member ID: [id number] Group ID: [id number] PCN: [id number] BIN: [id number] Eligibility Dates: 10/20/2018 to 10/20/2019  [place] Patient [place] Phone [phone] Fax [phone] 10/29/2018 8:36 AM

## 2018-10-29 NOTE — Telephone Encounter (Signed)
Patient notified of arrival time for 11/01/18.

## 2018-10-29 NOTE — Telephone Encounter (Signed)
Message left for patient notifying him that he will need to pre admit at the hospital two hours prior to his surgery scheduled for 11/01/18. He will need to arrive at the hospital at 7:30 am that morning and report to the Registration desk in the [facility] which is the first desk on the right.  If he has any questions he may call back.

## 2018-10-31 MED ORDER — CEFAZOLIN SODIUM-DEXTROSE 2-4 GM/100ML-% IV SOLN
2.0000 g | INTRAVENOUS | Status: AC
  Administered 2018-11-01: 2 g via INTRAVENOUS

## 2018-11-01 ENCOUNTER — Encounter: Payer: Self-pay | Admitting: *Deleted

## 2018-11-01 ENCOUNTER — Ambulatory Visit
Admission: RE | Admit: 2018-11-01 | Discharge: 2018-11-01 | Disposition: A | Discharge status: Home / Self Care | Payer: Medicare Other | Attending: Surgery | Admitting: Surgery

## 2018-11-01 ENCOUNTER — Encounter
Admission: RE | Disposition: A | Discharge status: Home / Self Care | Payer: Self-pay | Source: Home / Self Care | Attending: Surgery

## 2018-11-01 ENCOUNTER — Other Ambulatory Visit: Payer: Self-pay

## 2018-11-01 ENCOUNTER — Other Ambulatory Visit: Payer: Self-pay | Admitting: *Deleted

## 2018-11-01 ENCOUNTER — Telehealth: Payer: Self-pay | Admitting: *Deleted

## 2018-11-01 ENCOUNTER — Ambulatory Visit: Payer: Medicare Other | Admitting: Anesthesiology

## 2018-11-01 DIAGNOSIS — C799 Secondary malignant neoplasm of unspecified site: Secondary | ICD-10-CM

## 2018-11-01 DIAGNOSIS — C7989 Secondary malignant neoplasm of other specified sites: Secondary | ICD-10-CM | POA: Diagnosis not present

## 2018-11-01 DIAGNOSIS — C4359 Malignant melanoma of other part of trunk: Secondary | ICD-10-CM | POA: Diagnosis not present

## 2018-11-01 DIAGNOSIS — C4362 Malignant melanoma of left upper limb, including shoulder: Secondary | ICD-10-CM | POA: Insufficient documentation

## 2018-11-01 DIAGNOSIS — C439 Malignant melanoma of skin, unspecified: Secondary | ICD-10-CM

## 2018-11-01 DIAGNOSIS — C773 Secondary and unspecified malignant neoplasm of axilla and upper limb lymph nodes: Secondary | ICD-10-CM | POA: Diagnosis not present

## 2018-11-01 DIAGNOSIS — D649 Anemia, unspecified: Secondary | ICD-10-CM | POA: Diagnosis not present

## 2018-11-01 HISTORY — PX: AXILLARY LYMPH NODE DISSECTION: SHX5229

## 2018-11-01 LAB — CBC WITH DIFFERENTIAL/PLATELET
Abs Immature Granulocytes: 0.04 10*3/uL (ref 0.00–0.07)
Basophils Absolute: 0 10*3/uL (ref 0.0–0.1)
Basophils Relative: 1 %
Eosinophils Absolute: 0.1 10*3/uL (ref 0.0–0.5)
Eosinophils Relative: 2 %
HEMATOCRIT: 24.7 % — AB (ref 39.0–52.0)
Hemoglobin: 7.3 g/dL — ABNORMAL LOW (ref 13.0–17.0)
Immature Granulocytes: 1 %
LYMPHS ABS: 0.7 10*3/uL (ref 0.7–4.0)
LYMPHS PCT: 11 %
MCH: 23.5 pg — ABNORMAL LOW (ref 26.0–34.0)
MCHC: 29.6 g/dL — ABNORMAL LOW (ref 30.0–36.0)
MCV: 79.4 fL — ABNORMAL LOW (ref 80.0–100.0)
Monocytes Absolute: 0.6 10*3/uL (ref 0.1–1.0)
Monocytes Relative: 9 %
Neutro Abs: 5.2 10*3/uL (ref 1.7–7.7)
Neutrophils Relative %: 76 %
Platelets: 231 10*3/uL (ref 150–400)
RBC: 3.11 MIL/uL — ABNORMAL LOW (ref 4.22–5.81)
RDW: 19.6 % — ABNORMAL HIGH (ref 11.5–15.5)
WBC: 6.6 10*3/uL (ref 4.0–10.5)
nRBC: 0 % (ref 0.0–0.2)

## 2018-11-01 LAB — PREPARE RBC (CROSSMATCH)

## 2018-11-01 SURGERY — LYMPHADENECTOMY, AXILLARY
Anesthesia: General | Site: Axilla | Laterality: Left

## 2018-11-01 MED ORDER — ACETAMINOPHEN 500 MG PO TABS
ORAL_TABLET | ORAL | Status: AC
  Filled 2018-11-01: qty 2

## 2018-11-01 MED ORDER — CHLORHEXIDINE GLUCONATE CLOTH 2 % EX PADS: 6.0000 | MEDICATED_PAD | Freq: Once | CUTANEOUS | Status: DC

## 2018-11-01 MED ORDER — SUCCINYLCHOLINE CHLORIDE 20 MG/ML IJ SOLN
INTRAMUSCULAR | Status: DC | PRN
  Administered 2018-11-01: 100 mg via INTRAVENOUS

## 2018-11-01 MED ORDER — ENCORAFENIB 75 MG PO CAPS: 450.0000 mg | ORAL_CAPSULE | Freq: Every day | ORAL | 2 refills | Status: DC

## 2018-11-01 MED ORDER — OXYCODONE HCL 5 MG PO TABS
ORAL_TABLET | ORAL | Status: AC
  Filled 2018-11-01: qty 1

## 2018-11-01 MED ORDER — FENTANYL CITRATE (PF) 100 MCG/2ML IJ SOLN
INTRAMUSCULAR | Status: AC
  Filled 2018-11-01: qty 2

## 2018-11-01 MED ORDER — SODIUM CHLORIDE 0.9% IV SOLUTION
Freq: Once | INTRAVENOUS | Status: AC
  Administered 2018-11-01: 09:00:00 via INTRAVENOUS

## 2018-11-01 MED ORDER — FAMOTIDINE 20 MG PO TABS
20.0000 mg | ORAL_TABLET | Freq: Once | ORAL | Status: AC
  Administered 2018-11-01: 20 mg via ORAL

## 2018-11-01 MED ORDER — ACETAMINOPHEN 500 MG PO TABS
1000.0000 mg | ORAL_TABLET | ORAL | Status: AC
  Administered 2018-11-01: 1000 mg via ORAL

## 2018-11-01 MED ORDER — OXYCODONE HCL 5 MG/5ML PO SOLN: 5.0000 mg | Freq: Once | ORAL | Status: AC | PRN

## 2018-11-01 MED ORDER — BINIMETINIB 15 MG PO TABS: 45.0000 mg | ORAL_TABLET | Freq: Two times a day (BID) | ORAL | 2 refills | Status: DC

## 2018-11-01 MED ORDER — CEFAZOLIN SODIUM-DEXTROSE 2-4 GM/100ML-% IV SOLN
INTRAVENOUS | Status: AC
  Filled 2018-11-01: qty 100

## 2018-11-01 MED ORDER — SODIUM CHLORIDE FLUSH 0.9 % IV SOLN
INTRAVENOUS | Status: AC
  Filled 2018-11-01: qty 10

## 2018-11-01 MED ORDER — FAMOTIDINE 20 MG PO TABS
ORAL_TABLET | ORAL | Status: AC
  Administered 2018-11-01: 20 mg via ORAL
  Filled 2018-11-01: qty 1

## 2018-11-01 MED ORDER — PROPOFOL 10 MG/ML IV BOLUS
INTRAVENOUS | Status: AC
  Filled 2018-11-01: qty 20

## 2018-11-01 MED ORDER — MIDAZOLAM HCL 2 MG/2ML IJ SOLN
INTRAMUSCULAR | Status: AC
  Filled 2018-11-01: qty 2

## 2018-11-01 MED ORDER — EPHEDRINE SULFATE 50 MG/ML IJ SOLN
INTRAMUSCULAR | Status: DC | PRN
  Administered 2018-11-01 (×3): 10 mg via INTRAVENOUS

## 2018-11-01 MED ORDER — ACETAMINOPHEN 10 MG/ML IV SOLN
INTRAVENOUS | Status: DC | PRN
  Administered 2018-11-01: 1000 mg via INTRAVENOUS

## 2018-11-01 MED ORDER — PROMETHAZINE HCL 25 MG/ML IJ SOLN: 6.2500 mg | INTRAMUSCULAR | Status: DC | PRN

## 2018-11-01 MED ORDER — FENTANYL CITRATE (PF) 100 MCG/2ML IJ SOLN: 25.0000 ug | INTRAMUSCULAR | Status: DC | PRN

## 2018-11-01 MED ORDER — ROCURONIUM BROMIDE 100 MG/10ML IV SOLN
INTRAVENOUS | Status: DC | PRN
  Administered 2018-11-01: 10 mg via INTRAVENOUS

## 2018-11-01 MED ORDER — PHENYLEPHRINE HCL 10 MG/ML IJ SOLN
INTRAMUSCULAR | Status: DC | PRN
  Administered 2018-11-01 (×8): 100 ug via INTRAVENOUS

## 2018-11-01 MED ORDER — MIDAZOLAM HCL 2 MG/2ML IJ SOLN
INTRAMUSCULAR | Status: DC | PRN
  Administered 2018-11-01: 2 mg via INTRAVENOUS

## 2018-11-01 MED ORDER — DEXAMETHASONE SODIUM PHOSPHATE 10 MG/ML IJ SOLN
INTRAMUSCULAR | Status: DC | PRN
  Administered 2018-11-01: 10 mg via INTRAVENOUS

## 2018-11-01 MED ORDER — FENTANYL CITRATE (PF) 100 MCG/2ML IJ SOLN
INTRAMUSCULAR | Status: DC | PRN
  Administered 2018-11-01 (×2): 50 ug via INTRAVENOUS

## 2018-11-01 MED ORDER — LIDOCAINE HCL (CARDIAC) PF 100 MG/5ML IV SOSY
PREFILLED_SYRINGE | INTRAVENOUS | Status: DC | PRN
  Administered 2018-11-01: 100 mg via INTRAVENOUS

## 2018-11-01 MED ORDER — OXYCODONE HCL 5 MG PO TABS
5.0000 mg | ORAL_TABLET | Freq: Once | ORAL | Status: AC | PRN
  Administered 2018-11-01: 5 mg via ORAL

## 2018-11-01 MED ORDER — PROPOFOL 10 MG/ML IV BOLUS
INTRAVENOUS | Status: DC | PRN
  Administered 2018-11-01: 100 mg via INTRAVENOUS

## 2018-11-01 MED ORDER — MEPERIDINE HCL 50 MG/ML IJ SOLN: 6.2500 mg | INTRAMUSCULAR | Status: DC | PRN

## 2018-11-01 MED ORDER — ONDANSETRON HCL 4 MG/2ML IJ SOLN
INTRAMUSCULAR | Status: DC | PRN
  Administered 2018-11-01 (×2): 4 mg via INTRAVENOUS

## 2018-11-01 MED ORDER — LACTATED RINGERS IV SOLN
INTRAVENOUS | Status: DC
  Administered 2018-11-01 (×2): via INTRAVENOUS

## 2018-11-01 SURGICAL SUPPLY — 24 items
BNDG GAUZE 4.5X4.1 6PLY STRL (MISCELLANEOUS) ×3 IMPLANT
COVER WAND RF STERILE (DRAPES) ×3 IMPLANT
ELECT REM PT RETURN 9FT ADLT (ELECTROSURGICAL) ×3
ELECTRODE REM PT RTRN 9FT ADLT (ELECTROSURGICAL) ×1 IMPLANT
GAUZE SPONGE 4X4 12PLY STRL (GAUZE/BANDAGES/DRESSINGS) ×3 IMPLANT
GLOVE BIOGEL PI IND STRL 7.0 (GLOVE) ×1 IMPLANT
GLOVE BIOGEL PI IND STRL 7.5 (GLOVE) ×1 IMPLANT
GLOVE BIOGEL PI INDICATOR 7.0 (GLOVE) ×2
GLOVE BIOGEL PI INDICATOR 7.5 (GLOVE) ×2
GLOVE ECLIPSE 7.0 STRL STRAW (GLOVE) ×3 IMPLANT
GOWN STRL REUS W/TWL LRG LVL3 (GOWN DISPOSABLE) ×6 IMPLANT
HEMOSTAT ARISTA ABSORB 3G PWDR (MISCELLANEOUS) ×3 IMPLANT
HEMOSTAT SURGICEL 2X14 (HEMOSTASIS) ×3 IMPLANT
HEMOSTAT SURGICEL 2X3 (HEMOSTASIS) ×3 IMPLANT
KIT TURNOVER KIT A (KITS) ×3 IMPLANT
NEEDLE HYPO 25X1 1.5 SAFETY (NEEDLE) ×3 IMPLANT
NS IRRIG 1000ML POUR BTL (IV SOLUTION) ×3 IMPLANT
PACK BASIN MINOR ARMC (MISCELLANEOUS) ×3 IMPLANT
PULSAVAC PLUS IRRIG FAN TIP (DISPOSABLE) ×3
SOL .9 NS 3000ML IRR  AL (IV SOLUTION) ×2
SOL .9 NS 3000ML IRR UROMATIC (IV SOLUTION) ×1 IMPLANT
SPONGE LAP 18X18 RF (DISPOSABLE) ×3 IMPLANT
SYR BULB IRRIG 60ML STRL (SYRINGE) ×3 IMPLANT
TIP FAN IRRIG PULSAVAC PLUS (DISPOSABLE) ×1 IMPLANT

## 2018-11-01 NOTE — Interval H&P Note (Signed)
History and Physical Interval Note:  11/01/2018 7:35 AM  Louis Davies  has presented today for surgery, with the diagnosis of MEDTASTATIC MELANOMA  The various methods of treatment have been discussed with the patient and family. After consideration of risks, benefits and other options for treatment, the patient has consented to  Procedure(s): AXILLARY DEBRIDEMENT (Left) as a surgical intervention .  The patient's history has been reviewed, patient examined, no change in status, stable for surgery.  I have reviewed the patient's chart and labs.  Questions were answered to the patient's satisfaction.     Vickie Epley

## 2018-11-01 NOTE — Op Note (Signed)
SURGICAL OPERATIVE REPORT  DATE OF PROCEDURE: 11/01/2018  ATTENDING Surgeon(s): Vickie Epley, MD  ANESTHESIA: general   PRE-OPERATIVE DIAGNOSIS: Large bleeding, necrotic Left axillary mass(icd-10's: L98.493, I96, C43.9)  POST-OPERATIVE DIAGNOSIS: Large bleeding, necrotic Left axillary mass(icd-10's: L98.493, I96, C43.9)  PROCEDURE(S):  1.)Excision of large necrotic Left axillary mass (11 cm deep x 8 cm A-P x 6.5 cm wide) (cpt: 66440) 2.) Sharp excisional debridement ofLeft axillary soft tissue/tumornecrosis (cpt's: 34742, 11046 x2)  INTRAOPERATIVE FINDINGS: 11 cm x 8 cm x 6.5 cm large exophytic friable oozing mostly necrotic Left axillary mass with associated necrotic muscle, tumor, subcutaneous fat, and skin  INTRAVENOUS FLUIDS: 1100 mL crystalloid   ESTIMATED BLOOD LOSS: 75 mL   URINE OUTPUT: No Foley   SPECIMENS: Left Axillary Mass (necrotic and less necrotic specimens)  IMPLANTS: 1 roll Kerlix gauze packing  DRAINS: none  COMPLICATIONS: None apparent  CONDITION AT END OF PROCEDURE: Hemodynamically stable and extubated  DISPOSITION OF PATIENT: PACU  INDICATIONS FOR PROCEDURE:  Patient is a 68 y.o. malewith a history of metastatic melanoma s/p open Left axillary biopsy (06/2017), treated initially with immunotherapy until patient developed bowel perforation, requiring laparotomy (09/2017), following which patient was started on oral chemotherapy, on which PET-CT demonstrated Left axillary tumor necrosis without central hypermetabolism. Patient also underwent coil embolization of Left subscapular and lateral thoracic arteries (04/2018) for Left axillary bleeding mass/wound. More recently, patient's open Left axillary wound was managed by wound clinic until wound developed foul odor with enlarging necrotic Left axillary mass, for which he was referred to surgery and underwent excision with debridement. Post-operatively, patient expressed his range of motion and pain  had significantly improved with resolved bleeding until the Left axillary mass again recurred, along with his associated symptoms, limited LUE range of motion, and bleeding, for which he has required transfusion. Most recent PET-CT now also again demonstrates Left axillary hypermetabolism. Further evaluation at [place] clinic(s) confirm limited additional treatment options. Patient and his family requested palliative re-excision and debridement, for which patient was once again referred. All risks, benefits, and alternatives to excision of large necrotic Left axillary mass and debridementof Left axillary wound necrosiswere discussed with the patient, all of patient's questions were answered to his expressed satisfaction, and informed consent was obtained and documented.  DETAILS OF PROCEDURE: Patient was brought to the operating suite and appropriately identified. General anesthesia was administered along with appropriate pre-operative antibiotics, and endotracheal intubation was performed by anesthetist. In supine position, operative site was prepped and draped in the usual sterile fashion, and following a brief time out, a large portion of patient's Left axillary mass was excised, initially using electrocautery, followed by straight Mayo scissors. Additional necrotic Left axillary mass was removed and extracted using blunt finger fracture. Following excision/extraction of the large necrotic Left axillary mass, there remained significant additional tissue/tumor necrosis, which was sharply debrided using scissors and selective electrocautery. Necrosis extended to and included muscle as well as subcutaneous tissues.   Hemostasis was achieved using electrocautery, Arista (3g potato starch powder), and surgicel. Surrounding skin was cleaned and dried, after which wound was then packed using a single Kerlix, abdominal pads, and secured in place with an ACE wrap. Patient was then safely able to be extubated,  awakened, and transferred to PACU for post-operative monitoring and care. Despite having recently been transfused 2 units PRBC by cancer center for a hemoglobin of 6.1, patient's pre-operative hemoglobin was found to be 7.3. Accordingly, decision was made to transfuse 1 additional unit PRBC in  PACU as was discussed pre-operatively.  I was present for all aspects of the above procedure, and no operative complications were apparent.

## 2018-11-01 NOTE — Discharge Instructions (Signed)
In addition to included general post-operative instructions for Surgical Mass/Wound Excision/Debridement,  Diet: Resume home heart healthy diet.   Activity: No heavy lifting >20 pounds (children, pets, laundry, garbage) or strenuous activity until follow-up, but light activity and walking are encouraged. Do not drive or drink alcohol if taking narcotic pain medications.  Wound care: Remove dressing in 2 days unless otherwise instructed. Once dressing removed, 2 days after surgery (Wednesday, 1/15), you may shower/get incision wet with soapy water and pat dry (do not rub incisions), but no baths or submerging incision under water. Repack and change dry gauze dressing at least once daily or as needed if saturated.  Medications: Resume all home medications. For mild to moderate pain: acetaminophen (Tylenol) or ibuprofen/naproxen (if no kidney disease). Combining Tylenol with alcohol can substantially increase your risk of causing liver disease. Narcotic pain medications, if prescribed, can be used for severe pain, though may cause nausea, constipation, and drowsiness. Do not combine Tylenol and Percocet (or similar) within a 6 hour period as Percocet (and similar) contain(s) Tylenol. If you do not need the narcotic pain medication, you do not need to fill the prescription.  Call office [phone]) at any time if any questions, worsening pain, fevers/chills, bleeding, drainage from incision site, or other concerns.  Surgical Wound Debridement, Care After Refer to this sheet in the next few weeks. These instructions provide you with information about caring for yourself after your procedure. Your health care provider may also give you more specific instructions. Your treatment has been planned according to current medical practices, but problems sometimes occur. Call your health care provider if you have any problems or questions after your procedure. What can I expect after the procedure? After the  procedure, it is common to have:  Pain or soreness.  Fluid that leaks from the wound.  Stiffness. Follow these instructions at home: Medicines  Take over-the-counter and prescription medicines only as told by your health care provider.  If you were prescribed an antibiotic medicine, take it or apply it as told by your health care provider. Do not stop taking or using the antibiotic even if your condition improves. Wound care  Follow instructions from your health care provider about: ? How to take care of your wound. ? When and how you should change your dressing. ? When you should remove your dressing. If your dressing is dry and stuck when you try to remove it, moisten or wet the dressing with saline or water so that it can be removed without harming your skin or wound tissue.  Check your wound every day for signs of infection. Have a caregiver do this for you if you are not able. Watch for: ? More redness, swelling, or pain. ? More fluid, blood, or pus. ? A bad smell. Activity   Do not drive or operate heavy machinery while taking prescription pain medicine.  Ask your health care provider what activities are safe for you. General instructions  Eat a healthy diet with lots of protein. Ask your health care provider to suggest the best diet for you.  Do not smoke. Smoking makes it harder for your body to heal.  Keep all follow-up visits as told by your health care provider. This is important.  Do not take baths, swim, or use a hot tub until your health care provider approves. Contact a health care provider if:  You have a fever.  Your pain medicine is not helping.  Your wound is red and swollen.  You  have increased bleeding.  You have pus coming from your wound.  You have a bad smell coming from your wound.  Your wound is not getting better after 1-2 weeks of treatment.  You develop a new medical condition, such as diabetes, peripheral vascular disease, or  conditions that affect your defense (immune) system. This information is not intended to replace advice given to you by your health care provider. Make sure you discuss any questions you have with your health care provider. Document Released: 09/22/2012 Document Revised: 03/12/2016 Document Reviewed: 02/14/2015 Elsevier Interactive Patient Education  2019 [place]   1) The drugs that you were given will stay in your system until tomorrow so for the next 24 hours you should not:  A) Drive an automobile B) Make any legal decisions C) Drink any alcoholic beverage   2) You may resume regular meals tomorrow.  Today it is better to start with liquids and gradually work up to solid foods.  You may eat anything you prefer, but it is better to start with liquids, then soup and crackers, and gradually work up to solid foods.   3) Please notify your doctor immediately if you have any unusual bleeding, trouble breathing, redness and pain at the surgery site, drainage, fever, or pain not relieved by medication.    4) Additional Instructions:        Please contact your physician with any problems or Same Day Surgery at [phone], Monday through Friday 6 am to 4 pm, or [place] at [facility] number at [phone].

## 2018-11-01 NOTE — Anesthesia Procedure Notes (Signed)
Procedure Name: Intubation Date/Time: 11/01/2018 8:16 AM Performed by: Nelda Marseille, CRNA Pre-anesthesia Checklist: Patient identified, Patient being monitored, Timeout performed, Emergency Drugs available and Suction available Patient Re-evaluated:Patient Re-evaluated prior to induction Oxygen Delivery Method: Circle system utilized Preoxygenation: Pre-oxygenation with 100% oxygen Induction Type: IV induction Ventilation: Mask ventilation without difficulty Laryngoscope Size: Mac, 3 and McGraph Grade View: Grade III Tube type: Oral Tube size: 7.0 mm Number of attempts: 1 Airway Equipment and Method: Stylet Placement Confirmation: ETT inserted through vocal cords under direct vision,  positive ETCO2 and breath sounds checked- equal and bilateral Secured at: 21 cm Tube secured with: Tape Dental Injury: Teeth and Oropharynx as per pre-operative assessment

## 2018-11-01 NOTE — Anesthesia Post-op Follow-up Note (Signed)
Anesthesia QCDR form completed.        

## 2018-11-01 NOTE — OR Nursing (Signed)
Patient reports he had confusion about arrival time. Type and screen and CBC obtained. Dr. Rosana Hoes reports patient can go to surgery without the results.

## 2018-11-01 NOTE — Telephone Encounter (Signed)
Prescriptions sent to [facility].    Will follow up with [facility] to make sure patient receives medications.

## 2018-11-01 NOTE — Anesthesia Preprocedure Evaluation (Signed)
Anesthesia Evaluation  Patient identified by MRN, date of birth, ID band Patient awake    Reviewed: Allergy & Precautions, NPO status , Patient's Chart, lab work & pertinent test results  History of Anesthesia Complications (+) PONV and history of anesthetic complications  Airway Mallampati: II  TM Distance: >3 FB Neck ROM: Full    Dental no notable dental hx.    Pulmonary neg sleep apnea, neg COPD,    breath sounds clear to auscultation- rhonchi (-) wheezing      Cardiovascular Exercise Tolerance: Good (-) hypertension(-) CAD, (-) Past MI, (-) Cardiac Stents and (-) CABG  Rhythm:Regular Rate:Normal - Systolic murmurs and - Diastolic murmurs    Neuro/Psych neg Seizures negative neurological ROS  negative psych ROS   GI/Hepatic negative GI ROS, Neg liver ROS,   Endo/Other  negative endocrine ROSneg diabetes  Renal/GU negative Renal ROS     Musculoskeletal  (+) Arthritis ,   Abdominal (+) - obese,   Peds  Hematology  (+) anemia ,   Anesthesia Other Findings Past Medical History: No date: Anemia No date: Arthritis No date: Cancer ([place])     Comment:  MELANOMA No date: Complication of anesthesia No date: Dyspnea     Comment:  doe No date: Perforated viscus No date: PONV (postoperative nausea and vomiting)   Reproductive/Obstetrics                             Anesthesia Physical Anesthesia Plan  ASA: III  Anesthesia Plan: General   Post-op Pain Management:    Induction: Intravenous  PONV Risk Score and Plan: 2 and Ondansetron, Dexamethasone and Midazolam  Airway Management Planned: Oral ETT  Additional Equipment:   Intra-op Plan:   Post-operative Plan: Extubation in OR  Informed Consent: I have reviewed the patients History and Physical, chart, labs and discussed the procedure including the risks, benefits and alternatives for the proposed anesthesia with the patient or  authorized representative who has indicated his/her understanding and acceptance.   Dental advisory given  Plan Discussed with: CRNA and Anesthesiologist  Anesthesia Plan Comments:         Anesthesia Quick Evaluation

## 2018-11-01 NOTE — Transfer of Care (Signed)
Immediate Anesthesia Transfer of Care Note  Patient: Louis Davies  Procedure(s) Performed: AXILLARY DEBRIDEMENT (Left Axilla)  Patient Location: PACU  Anesthesia Type:General  Level of Consciousness: sedated  Airway & Oxygen Therapy: Patient Spontanous Breathing and Patient connected to face mask oxygen  Post-op Assessment: Report given to RN and Post -op Vital signs reviewed and stable  Post vital signs: Reviewed and stable  Last Vitals:  Vitals Value Taken Time  BP 117/64 11/01/2018  9:17 AM  Temp    Pulse 69 11/01/2018  9:18 AM  Resp 14 11/01/2018  9:18 AM  SpO2 100 % 11/01/2018  9:18 AM  Vitals shown include unvalidated device data.  Last Pain:  Vitals:   11/01/18 0734  TempSrc: Oral  PainSc: 0-No pain         Complications: No apparent anesthesia complications

## 2018-11-01 NOTE — Telephone Encounter (Signed)
Oral Chemotherapy Pharmacist Encounter  Mektovi/Braftovi can not be filled at [facility] due to limited distribution of the medications. Prescription has been e-scribed to [facility].  Supportive information was faxed to [facility]. We will continue to follow medication access.   Darl Pikes, PharmD, BCPS, [facility] Hematology/Oncology Clinical Pharmacist [facility] [phone]  11/01/2018 9:26 AM

## 2018-11-01 NOTE — Telephone Encounter (Signed)
Message left on home and cell numbers for patient to call the office.   Per Angie, patient's family had a question regarding wound care.   Per Dr. Rosana Hoes, patient needs to leave current dressing in place for 2 days. He will remove on Wednesday. Patient will take the gauze packing out and repack.   The patient will follow up with Dr. Rosana Hoes in the office as scheduled for 11-09-18.

## 2018-11-01 NOTE — Progress Notes (Signed)
Left Dr Rosana Hoes a message asking for clarification on dsg changes as well as patients caregiver would like speak to him regarding surgery as she did not have the opportunity to speak to him.  Pt family and patient do not want to wait. They are aware they are to reinforce site only until specific orders from Dr Rosana Hoes. Advised to call office asap. I did call but office was closed for lunch at the time.  Angie from Dr Rosana Hoes office did call the patient as he was leaving and will contact the patient at home with specific instructions.

## 2018-11-01 NOTE — Anesthesia Postprocedure Evaluation (Signed)
Anesthesia Post Note  Patient: Louis Davies  Procedure(s) Performed: AXILLARY DEBRIDEMENT (Left Axilla)  Patient location during evaluation: PACU Anesthesia Type: General Level of consciousness: awake and alert and oriented Pain management: pain level controlled Vital Signs Assessment: post-procedure vital signs reviewed and stable Respiratory status: spontaneous breathing, nonlabored ventilation and respiratory function stable Cardiovascular status: blood pressure returned to baseline and stable Postop Assessment: no signs of nausea or vomiting Anesthetic complications: no     Last Vitals:  Vitals:   11/01/18 1210 11/01/18 1404  BP: 113/70 113/70  Pulse: 83 80  Resp: 18   Temp: (!) 36.2 C 36.4 C  SpO2: 100% 99%    Last Pain:  Vitals:   11/01/18 1404  TempSrc:   PainSc: 0-No pain                 Jahking Lesser

## 2018-11-02 ENCOUNTER — Encounter: Payer: Medicare Other | Admitting: Occupational Therapy

## 2018-11-02 NOTE — Telephone Encounter (Signed)
Oral Chemotherapy Pharmacist Encounter  Patient Education I spoke with patient for overview of new oral chemotherapy medication: Mektovi (binimetinib)and Braftovi (encorafenib) for the treatment of metastatic melanoma, BRAF positive, planned duration until disease progression or unacceptable drug toxicity.   Counseled patient on administration, dosing, side effects, monitoring, drug-food interactions, safe handling, storage, and disposal. Patient will take: - Mektovi: Take 45 mg by mouth 2 (two) times daily - Braftovi: Take 450 mg by mouth daily  Side effects include but not limited to: N/V, decreased hgb, fatigue, decrease in renal or liver function.    Reviewed with patient importance of keeping a medication schedule and plan for any missed doses.  Mr. Ribeiro voiced understanding and appreciation. All questions answered. Medication handout placed in the mail.  Provided patient with [facility] phone number. Patient knows to call the office with questions or concerns. Oral [facility] will continue to follow.  Darl Pikes, PharmD, BCPS, [facility] Hematology/Oncology Clinical Pharmacist [facility] [phone]  11/02/2018 2:13 PM

## 2018-11-03 ENCOUNTER — Encounter: Payer: Self-pay | Admitting: Surgery

## 2018-11-03 NOTE — Telephone Encounter (Signed)
[facility] scheduled delivery for 11/02/2018.

## 2018-11-04 ENCOUNTER — Ambulatory Visit: Payer: Medicare Other | Admitting: Occupational Therapy

## 2018-11-04 ENCOUNTER — Encounter: Payer: Medicare Other | Admitting: Occupational Therapy

## 2018-11-04 LAB — TYPE AND SCREEN
ABO/RH(D): B NEG
ANTIBODY SCREEN: NEGATIVE
Unit division: 0
Unit division: 0

## 2018-11-04 LAB — BPAM RBC
Blood Product Expiration Date: 202002062359
Blood Product Expiration Date: 202002072359
ISSUE DATE / TIME: 202001130904
Unit Type and Rh: 1700
Unit Type and Rh: 1700

## 2018-11-04 LAB — PREPARE RBC (CROSSMATCH)

## 2018-11-04 LAB — SURGICAL PATHOLOGY

## 2018-11-07 NOTE — Progress Notes (Deleted)
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  INTERVAL HISTORY: Patient returns to clinic today for further evaluation, discussion of his imaging results, and treatment planning.  He had a second opinion at [facility] earlier this week.  He continues to have enlarging left axillary mass.  His weakness and fatigue are unchanged.  He continues to have pain.  He has no neurologic complaints. He denies any recent fevers or illnesses. He has no chest pain, cough, or hemoptysis. He denies any nausea, vomiting, constipation, or diarrhea. He has no melena or hematochezia. He has no urinary complaints.  Patient offers no further specific complaints today.  REVIEW OF SYSTEMS:   Review of Systems  Constitutional: Positive for malaise/fatigue. Negative for fever and weight loss.  Respiratory: Negative.  Negative for cough and shortness of breath.   Cardiovascular: Negative.  Negative for chest pain and leg swelling.  Gastrointestinal: Negative.  Negative for abdominal pain, blood in stool, melena, nausea and vomiting.  Genitourinary: Negative.  Negative for dysuria.  Musculoskeletal: Negative.  Negative for myalgias.  Skin: Negative.  Negative for rash.  Neurological: Positive for weakness. Negative for sensory change and focal weakness.  Psychiatric/Behavioral: Negative.  The patient is not nervous/anxious.     As per HPI. Otherwise, a complete review of systems is negative.  PAST MEDICAL HISTORY: Past Medical History:  Diagnosis Date  . Anemia   . Arthritis   . Cancer ([place])    MELANOMA  . Complication of anesthesia   . Dyspnea    doe  . Perforated viscus   . PONV (postoperative nausea and vomiting)     PAST SURGICAL  HISTORY: Past Surgical History:  Procedure Laterality Date  . AXILLARY LYMPH NODE DISSECTION Left 06/26/2017   Procedure: EXCISION OF LEFT AXILLARY MASS;  Surgeon: Johnathan Hausen, MD;  Location: [facility] ORS;  Service: General;  Laterality: Left;  . AXILLARY LYMPH NODE DISSECTION Left 11/01/2018   Procedure: AXILLARY DEBRIDEMENT;  Surgeon: Vickie Epley, MD;  Location: [facility] ORS;  Service: General;  Laterality: Left;  . DIAGNOSTIC LAPAROSCOPY    . EMBOLIZATION Left 05/11/2018   Procedure: EMBOLIZATION;  Surgeon: Katha Cabal, MD;  Location: [facility] [address] CV LAB;  Service: Cardiovascular;  Laterality: Left;  . LAPAROTOMY N/A 10/14/2017   Procedure: EXPLORATORY LAPAROTOMY;  Surgeon: Clayburn Pert, MD;  Location: [facility] ORS;  Service: General;  Laterality: N/A;  . VASECTOMY    . WOUND DEBRIDEMENT Left 09/24/2018   Procedure: LEFT AXILLARY EXCISIONAL DEBRIDEMENT OF NECROTIC TISSUE;  Surgeon: Vickie Epley, MD;  Location: [facility] ORS;  Service: General;  Laterality: Left;    FAMILY HISTORY: Family History  Problem Relation Age of Onset  . Diabetes Mother   . Hypertension Mother   . Leukemia Father   . Thyroid disease Sister   . Liver disease Maternal Grandmother   . Heart Problems Maternal Grandfather   . Diabetes Maternal Grandfather   . Lung cancer Paternal Grandmother   . Prostate cancer Paternal Grandfather     ADVANCED DIRECTIVES (Y/N):  N  HEALTH MAINTENANCE: Social History   Tobacco Use  . Smoking status: Never Smoker  . Smokeless tobacco: Never Used  Substance Use Topics  . Alcohol use: No  . Drug use: No     Colonoscopy:  PAP:  Bone density:  Lipid panel:  No Known Allergies  Current Outpatient Medications  Medication Sig Dispense Refill  . Binimetinib (MEKTOVI) 15 MG TABS Take 45 mg by mouth 2 (two) times daily. 180 tablet 2  . Encorafenib 75 MG CAPS Take 450 mg by mouth daily. 180 capsule 2  . ibuprofen (ADVIL,MOTRIN) 200 MG tablet Take 200 mg by mouth  every 6 (six) hours as needed.    . Multiple Vitamin (MULTIVITAMIN) tablet Take 1 tablet by mouth daily.    . Oxycodone HCl 10 MG TABS Take 1 tablet (10 mg total) by mouth every 6 (six) hours as needed. 60 tablet 0   No current facility-administered medications for this visit.     OBJECTIVE: There were no vitals filed for this visit.   There is no height or weight on file to calculate BMI.    ECOG FS:2 - Symptomatic, <50% confined to bed  General: Ill-appearing, no acute distress. Eyes: Pink conjunctiva, anicteric sclera. HEENT: Normocephalic, moist mucous membranes. Lungs: Clear to auscultation bilaterally. Heart: Regular rate and rhythm. No rubs, murmurs, or gallops. Abdomen: Soft, nontender, nondistended. No organomegaly noted, normoactive bowel sounds. Musculoskeletal: No edema, cyanosis, or clubbing. Neuro: Alert, answering all questions appropriately. Cranial nerves grossly intact. Skin: No rashes or petechiae noted. Psych: Normal affect. Lymphatics: Surgical dressing over left axillary mass.    LAB RESULTS:  Lab Results  Component Value Date   NA 139 10/26/2018   K 4.2 10/26/2018   CL 106 10/26/2018   CO2 27 10/26/2018   GLUCOSE 109 (H) 10/26/2018   BUN 18 10/26/2018   CREATININE 1.09 10/26/2018   CALCIUM 8.5 (L) 10/26/2018   PROT 5.7 (L) 10/26/2018   ALBUMIN 2.9 (L) 10/26/2018   AST 16 10/26/2018   ALT 11 10/26/2018   ALKPHOS 43 10/26/2018   BILITOT 0.5 10/26/2018   GFRNONAA >60 10/26/2018   GFRAA >60 10/26/2018    Lab Results  Component Value Date   WBC 6.6 11/01/2018   NEUTROABS 5.2 11/01/2018   HGB 7.3 (L) 11/01/2018   HCT 24.7 (L) 11/01/2018   MCV 79.4 (L) 11/01/2018   PLT 231 11/01/2018     STUDIES: Nm Pet Image Restage (ps) Whole Body  Result Date: 10/22/2018 CLINICAL DATA:  Subsequent treatment strategy for metastatic melanoma. EXAM: NUCLEAR MEDICINE PET WHOLE BODY TECHNIQUE: 7.82 mCi F-18 FDG was injected intravenously. Full-ring PET imaging  was performed from the skull base to thigh after the radiotracer. CT data was obtained and used for attenuation correction and anatomic localization. Fasting blood glucose: 79 mg/dl COMPARISON:  PET-CT 07/19/2018 FINDINGS: Mediastinal blood pool activity: SUV max 2.31 HEAD/NECK: No neck mass or neck adenopathy. There is hypermetabolism in the left thyroid lobe with SUV max of 7.52. This was also present on the prior study although SUV max was only 4.37. This is likely due to thyroiditis. Incidental CT findings: none CHEST: Large persistent left axillary necrotic soft tissue mass measuring approximately 12.2 x 5.3 cm. It previously measured 7.5 x 6.6 cm. It appears to be grossly eroding through the skin and is now markedly exophytic in the lower axillary region. This portion measures 8.0 x 4.6 cm. SUV max is 17.33 in the non necrotic areas. There was previously 7.29. Moderate activity noted between the scapula and the ribs without discrete mass. This may be some type of inflammatory process. Stable 8 mm right-sided subcarinal lymph node is hypermetabolic with SUV max of 9.79. This was previously 4.58. Small right hilar node is also  mildly hypermetabolic with SUV max of 1.66. This was previously 3.28. A few small scattered pulmonary nodules are stable. On the prior PET-CT there was a focal skin lesion with hypermetabolism involving the upper left posterior chest. This has resolved. Incidental CT findings: none ABDOMEN/PELVIS: No abnormal hypermetabolic activity within the liver, pancreas, adrenal glands, or spleen. No hypermetabolic lymph nodes in the abdomen or pelvis. Incidental CT findings: none SKELETON: No focal hypermetabolic activity to suggest skeletal metastasis. Incidental CT findings: none EXTREMITIES: No abnormal hypermetabolic activity in the lower extremities. Incidental CT findings: none IMPRESSION: 1. Enlarging left axillary necrotic mass with a much bigger exophytic portion in the lower axilla. SUV  max has increased from 7.29 to 17.33. 2. Stable small right hilar and right subcarinal lymph nodes with mild hypermetabolism. 3. A few small scattered pulmonary nodules are stable. No new or progressive findings. 4. No abdominal/pelvic metastatic disease or osseous metastatic disease. Electronically Signed   By: Marijo Sanes M.D.   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  PLAN:    1. 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  [place] input.  Patient will see surgery on Thursday for palliative debridement.  Plan to switch treatment to combination therapy using encorafenib/binimetinib.  Patient expressed understanding that his treatment options are limited.  Return to clinic in 2 weeks for repeat laboratory work and further evaluation.   2.  Left axilla: Appreciate [facility] and surgical input.  Follow-up with surgery as indicated. 3.  Left arm swelling: Significantly improved.  Continue follow-up with lymphedema clinic. 4.  Poor appetite: Improved.  Continue Megace as prescribed.  Appreciate dietary input. 5.  Anemia: Patient's hemoglobin has significantly decreased and will return to clinic on Thursday for 2 units of packed red blood cells.  Patient expressed understanding and was in agreement with this plan. He also understands that He can call clinic at any time with any questions, concerns, or complaints.   Cancer Staging Metastatic melanoma [facility]) Staging form: Melanoma of the Skin, AJCC 8th Edition - Clinical stage from 07/15/2017: Stage IV (cTX, cN1, cM1c(0)) - Signed by Lloyd Huger, MD on 07/15/2017   Lloyd Huger, MD   11/07/2018 8:54 AM

## 2018-11-08 ENCOUNTER — Telehealth: Payer: Self-pay | Admitting: *Deleted

## 2018-11-08 ENCOUNTER — Inpatient Hospital Stay: Payer: Medicare Other

## 2018-11-08 ENCOUNTER — Inpatient Hospital Stay (HOSPITAL_BASED_OUTPATIENT_CLINIC_OR_DEPARTMENT_OTHER): Payer: Medicare Other | Admitting: Nurse Practitioner

## 2018-11-08 ENCOUNTER — Encounter: Payer: Medicare Other | Admitting: Occupational Therapy

## 2018-11-08 VITALS — BP 107/62 | HR 74 | Temp 98.8°F | Resp 20

## 2018-11-08 DIAGNOSIS — Z5189 Encounter for other specified aftercare: Secondary | ICD-10-CM

## 2018-11-08 DIAGNOSIS — C773 Secondary and unspecified malignant neoplasm of axilla and upper limb lymph nodes: Secondary | ICD-10-CM

## 2018-11-08 DIAGNOSIS — C799 Secondary malignant neoplasm of unspecified site: Secondary | ICD-10-CM

## 2018-11-08 DIAGNOSIS — C781 Secondary malignant neoplasm of mediastinum: Secondary | ICD-10-CM | POA: Diagnosis not present

## 2018-11-08 DIAGNOSIS — Z801 Family history of malignant neoplasm of trachea, bronchus and lung: Secondary | ICD-10-CM

## 2018-11-08 DIAGNOSIS — R55 Syncope and collapse: Secondary | ICD-10-CM

## 2018-11-08 DIAGNOSIS — R5383 Other fatigue: Secondary | ICD-10-CM

## 2018-11-08 DIAGNOSIS — C439 Malignant melanoma of skin, unspecified: Secondary | ICD-10-CM | POA: Diagnosis not present

## 2018-11-08 DIAGNOSIS — Z8042 Family history of malignant neoplasm of prostate: Secondary | ICD-10-CM

## 2018-11-08 DIAGNOSIS — C7951 Secondary malignant neoplasm of bone: Secondary | ICD-10-CM | POA: Diagnosis not present

## 2018-11-08 DIAGNOSIS — Z1509 Genetic susceptibility to other malignant neoplasm: Secondary | ICD-10-CM | POA: Diagnosis not present

## 2018-11-08 DIAGNOSIS — D649 Anemia, unspecified: Secondary | ICD-10-CM | POA: Diagnosis not present

## 2018-11-08 DIAGNOSIS — R531 Weakness: Secondary | ICD-10-CM | POA: Diagnosis not present

## 2018-11-08 DIAGNOSIS — Z79899 Other long term (current) drug therapy: Secondary | ICD-10-CM | POA: Diagnosis not present

## 2018-11-08 DIAGNOSIS — E860 Dehydration: Secondary | ICD-10-CM

## 2018-11-08 DIAGNOSIS — Z515 Encounter for palliative care: Secondary | ICD-10-CM | POA: Diagnosis not present

## 2018-11-08 DIAGNOSIS — H3093 Unspecified chorioretinal inflammation, bilateral: Secondary | ICD-10-CM

## 2018-11-08 DIAGNOSIS — M129 Arthropathy, unspecified: Secondary | ICD-10-CM | POA: Diagnosis not present

## 2018-11-08 DIAGNOSIS — R918 Other nonspecific abnormal finding of lung field: Secondary | ICD-10-CM | POA: Diagnosis not present

## 2018-11-08 DIAGNOSIS — R0602 Shortness of breath: Secondary | ICD-10-CM | POA: Diagnosis not present

## 2018-11-08 LAB — COMPREHENSIVE METABOLIC PANEL
ALT: 9 U/L (ref 0–44)
AST: 13 U/L — ABNORMAL LOW (ref 15–41)
Albumin: 2.6 g/dL — ABNORMAL LOW (ref 3.5–5.0)
Alkaline Phosphatase: 53 U/L (ref 38–126)
Anion gap: 7 (ref 5–15)
BUN: 24 mg/dL — ABNORMAL HIGH (ref 8–23)
CO2: 25 mmol/L (ref 22–32)
Calcium: 7.6 mg/dL — ABNORMAL LOW (ref 8.9–10.3)
Chloride: 105 mmol/L (ref 98–111)
Creatinine, Ser: 1.01 mg/dL (ref 0.61–1.24)
GFR calc Af Amer: >60 mL/min (ref >60)
GFR calc non Af Amer: >60 mL/min (ref >60)
GLUCOSE: 126 mg/dL — AB (ref 70–99)
Potassium: 4 mmol/L (ref 3.5–5.1)
Sodium: 137 mmol/L (ref 135–145)
Total Bilirubin: 0.3 mg/dL (ref 0.3–1.2)
Total Protein: 5.4 g/dL — ABNORMAL LOW (ref 6.5–8.1)

## 2018-11-08 LAB — CBC WITH DIFFERENTIAL/PLATELET
Abs Immature Granulocytes: 0.02 10*3/uL (ref 0.00–0.07)
Basophils Absolute: 0 10*3/uL (ref 0.0–0.1)
Basophils Relative: 0 %
Eosinophils Absolute: 0.2 10*3/uL (ref 0.0–0.5)
Eosinophils Relative: 3 %
HCT: 24.7 % — ABNORMAL LOW (ref 39.0–52.0)
Hemoglobin: 7.3 g/dL — ABNORMAL LOW (ref 13.0–17.0)
Immature Granulocytes: 0 %
Lymphocytes Relative: 15 %
Lymphs Abs: 1 10*3/uL (ref 0.7–4.0)
MCH: 23.6 pg — ABNORMAL LOW (ref 26.0–34.0)
MCHC: 29.6 g/dL — AB (ref 30.0–36.0)
MCV: 79.9 fL — ABNORMAL LOW (ref 80.0–100.0)
MONOS PCT: 6 %
Monocytes Absolute: 0.4 10*3/uL (ref 0.1–1.0)
Neutro Abs: 5.3 10*3/uL (ref 1.7–7.7)
Neutrophils Relative %: 76 %
Platelets: 311 10*3/uL (ref 150–400)
RBC: 3.09 MIL/uL — ABNORMAL LOW (ref 4.22–5.81)
RDW: 18.6 % — ABNORMAL HIGH (ref 11.5–15.5)
WBC: 7 10*3/uL (ref 4.0–10.5)
nRBC: 0 % (ref 0.0–0.2)

## 2018-11-08 MED ORDER — PREDNISOLONE ACETATE 1 % OP SUSP: 1.0000 [drp] | Freq: Four times a day (QID) | OPHTHALMIC | 0 refills | Status: DC

## 2018-11-08 MED ORDER — SODIUM CHLORIDE 0.9 % IV SOLN
Freq: Once | INTRAVENOUS | Status: AC
  Administered 2018-11-08: 14:00:00 via INTRAVENOUS
  Filled 2018-11-08: qty 250

## 2018-11-08 NOTE — Patient Instructions (Addendum)
You are scheduled to see Dr. Neville Route at [facility] tomorrow 11/09/2018 at 2:30pm. The address is: [address] [place], [place]. Please hold your chemotherapy pills until you see Dr. Grayland Ormond next week and let me know if you have any new or worsening symptoms. It was a pleasure meeting you today and thank you for allowing me to participate in your care. -Beckey Rutter, NP Uveitis Uveitis is swelling and irritation in the eye. Most of the time, it affects the middle part of the eye (uvea). There are many types of uveitis:  Iritis. This type affects the colored part of the eye.  Intermediate uveitis. This type affects the middle of the eye.  Posterior uveitis. This type affects the back of the eye.  Panuveitis. This type affects all layers of the eye. Uveitis can affect one eye or both eyes. Over time, the condition may lead to vision loss. Follow these instructions at home:   Take over-the-counter and prescription medicines only as told by your doctor.  Follow instructions for safely putting eye drops in your eyes.  Drink enough fluid to keep your pee (urine) pale yellow.  Follow instructions from your doctor about what activities are safe for you.  Do not use any products that contain nicotine or tobacco, such as cigarettes and e-cigarettes. If you need help quitting, ask your doctor.  Keep all follow-up visits as told by your doctor. This is important. Contact a doctor if:  Your symptoms do not get better. Get help right away if:  You cannot see as much as you did before.  You have more redness in one eye or both eyes.  Light bothers your eyes a lot.  You have pain in your eye.  You have aching in your eye. Summary  Uveitis is swelling and irritation in the eye. Most of the time, it affects the middle part of the eye (uvea).  Uveitis can affect one eye or both eyes. Over time, the condition may lead to vision loss.  Follow instructions for safely putting  eye drops in your eyes.  Keep all follow-up visits as told by your doctor. This is important. This information is not intended to replace advice given to you by your health care provider. Make sure you discuss any questions you have with your health care provider. Document Released: 02/20/2015 Document Revised: 10/27/2017 Document Reviewed: 10/27/2017 Elsevier Interactive Patient Education  2019 [employer].

## 2018-11-08 NOTE — Telephone Encounter (Signed)
Patient accepts appointment for 1245 for lab then see NP and was advised not to take his medicine If he hasn't already and he reports that he has not taken it because it makes him feel so bad

## 2018-11-08 NOTE — Telephone Encounter (Signed)
[facility] please 

## 2018-11-08 NOTE — Progress Notes (Signed)
[facility]pt[facility] [place]  Telephone:([phone] [phone]  Patient Care Team: Alvera Singh, Bennett as PCP - General (Family Medicine)   Name of the patient: Louis Davies  [id number]  May 06, 1951   Date of visit: 11/08/18  Diagnosis- Metastatic Melanoma  Chief complaint/ Reason for visit- Vision Changes  Heme/Onc history:  Oncology History   Patient initially  noticed an enlarging nodule in his left axilla approximately in July 2018. Subsequent excisional biopsy revealed metastatic melanoma. Patient had a lesion removed from his left back back in 2016 but otherwise has noted no other problems.  Had Pet Scan in Sept 2019 revealing metastatic disease. BRAF mutation is positive.    MRI of Brain revealed no metastatic disease.   Began with  immunotherapy using ipilumimab and nivolumab every 3 weeks for 4 cycles and then continue with maintenance nivomumab every 2 weeks until progression of disease.  - Ipilimimab & nivolumab complicated by small intestine perforation. Immunotherapy discontinued 10/2017.   Started on 960 mg Zelboraf (10/2017) daily. BRAF mutation is positive.  Dose reduced to 730m BID in March 2019 d/t side effects including nausea, vomiting, weight loss and abdominal bloating.   [facility] [place]  Restarted at reduced dose of 480 mg in May 2019.  Had restaging PET scan may 2019 revealed esponse to therapy, as evidenced by decreased size and hypermetabolism of dominant left axillary mass. Other thoracic nodes were improved and resolved in hypermetabolism as detailed above. 2. Decreased hypermetabolism within indeterminate left iliac lesion  On 04/26/18 patient noted enlarging axillary mass and was concerned. CT chest ordered revelaing a large left axillary mass that had dramatically increased in size, measuring over 5 times the volume that it did on 03/04/2018. Appearance  compatible with rapid growth of malignancy.  - 05/2018 s/p XRT to left axillary mass - 06/2018 PET CT revealing dominant left axillary mass with small ulcerative lesion within the left posterior chest and 2 cm lucent lesion of the left iliac bone -11/29 evaluation for chronic painful left axillary wound and thought to be 2/2 tumor necrosis was treated with Keflex and supportive wound care with no improvement 09/24/2018-debridement and excision of large necrotic mass followed by rapid enlargement of tumor [facility] and associated pain/infection.  CT chest revealing postsurgical changes in left axilla with heterogeneous density with superimposed infection.  No extension in the thoracic cavity.  Stable small bilateral pulmonary nodules measuring up to 4 mm several of which are calcified suggestive of prior granulomatosis disease -10/25/2017 evaluated by Dr. STrenda Moots- Initiated encorafenib and binimetinib on 11/01/2018     Metastatic melanoma ([place]   07/07/2017 Initial Diagnosis    [place]     Interval history- Louis Davies 68 year old male, with above history of metastatic melanoma [facility] for vision changes that started day after he started encorafenib & binimetinib. He describes symptoms as blurred vision, floaters, seeing spots. He felt lightheaded and dizzy describing near syncopal episodes. He says that he held medication today and his vision improved/symptoms resolved. He denies pain or changes to his eyes. Denies tearing or foreign body sensation. He feels increasingly weak and had one episode of emesis.  Denies fevers. Appetite has been reduced and he endorses changes in taste. Has not been on appetite stimulating medication.  He underwent debridement of axillary mass with Dr. DRosana Hoes  on 11/01/2018. Hemoglobin was 7.3 post-op and he received 1 unit pRBCs.   ECOG FS:2 - Symptomatic, <50%  confined to bed  Review of systems- Review of Systems  Constitutional: Negative.   HENT: Negative.   Eyes: Positive for blurred vision (resolved). Negative for double vision, photophobia, pain, discharge and redness.  Respiratory: Negative.   Cardiovascular: Negative.   Gastrointestinal: Negative.   Genitourinary: Negative.   Musculoskeletal: Negative.   Skin:       Left axillary mass  Neurological: Positive for dizziness and weakness. Negative for loss of consciousness.  Endo/Heme/Allergies: Negative.   Psychiatric/Behavioral: Negative.     Current treatment- binimetinib-encorafenib  No Known Allergies  Past Medical History:  Diagnosis Date  . Anemia   . Arthritis   . Cancer ([place])    MELANOMA  . Complication of anesthesia   . Dyspnea    doe  . Perforated viscus   . PONV (postoperative nausea and vomiting)     Past Surgical History:  Procedure Laterality Date  . AXILLARY LYMPH NODE DISSECTION Left 06/26/2017   Procedure: EXCISION OF LEFT AXILLARY MASS;  Surgeon: Johnathan Hausen, MD;  Location: [facility] ORS;  Service: General;  Laterality: Left;  . AXILLARY LYMPH NODE DISSECTION Left 11/01/2018   Procedure: AXILLARY DEBRIDEMENT;  Surgeon: Vickie Epley, MD;  Location: [facility] ORS;  Service: General;  Laterality: Left;  . DIAGNOSTIC LAPAROSCOPY    . EMBOLIZATION Left 05/11/2018   Procedure: EMBOLIZATION;  Surgeon: Katha Cabal, MD;  Location: [facility];  Service: Cardiovascular;  Laterality: Left;  . LAPAROTOMY N/A 10/14/2017   Procedure: EXPLORATORY LAPAROTOMY;  Surgeon: Clayburn Pert, MD;  Location: [facility] ORS;  Service: General;  Laterality: N/A;  . VASECTOMY    . WOUND DEBRIDEMENT Left 09/24/2018   Procedure: LEFT AXILLARY EXCISIONAL DEBRIDEMENT OF NECROTIC TISSUE;  Surgeon: Vickie Epley, MD;  Location: [facility] ORS;  Service: General;  Laterality: Left;    Social History   Socioeconomic History  . Marital status: Divorced    Spouse name: Not on file   . Number of children: Not on file  . Years of education: Not on file  . Highest education level: Not on file  Occupational History  . Not on file  Social Needs  . Financial resource strain: Not on file  . Food insecurity:    Worry: Not on file    Inability: Not on file  . Transportation needs:    Medical: Not on file    Non-medical: Not on file  Tobacco Use  . Smoking status: Never Smoker  . Smokeless tobacco: Never Used  Substance and Sexual Activity  . Alcohol use: No  . Drug use: No  . Sexual activity: Yes  Lifestyle  . Physical activity:    Days per week: Not on file    Minutes per session: Not on file  . Stress: Not on file  Relationships  . Social connections:    Talks on phone: Not on file    Gets together: Not on file    Attends religious service: Not on file    Active member of club or organization: Not on file    Attends meetings of clubs or organizations: Not on file    Relationship status: Not on file  . Intimate partner violence:    Fear of current or ex partner: Not on file    Emotionally abused: Not on file    Physically abused: Not on file    Forced sexual activity: Not  on file  Other Topics Concern  . Not on file  Social History Narrative  . Not on file    Family History  Problem Relation Age of Onset  . Diabetes Mother   . Hypertension Mother   . Leukemia Father   . Thyroid disease Sister   . Liver disease Maternal Grandmother   . Heart Problems Maternal Grandfather   . Diabetes Maternal Grandfather   . Lung cancer Paternal Grandmother   . Prostate cancer Paternal Grandfather      Current Outpatient Medications:  .  Binimetinib (MEKTOVI) 15 MG TABS, Take 45 mg by mouth 2 (two) times daily., Disp: 180 tablet, Rfl: 2 .  Encorafenib 75 MG CAPS, Take 450 mg by mouth daily., Disp: 180 capsule, Rfl: 2 .  ibuprofen (ADVIL,MOTRIN) 200 MG tablet, Take 200 mg by mouth every 6 (six) hours as needed., Disp: , Rfl:  .  Multiple Vitamin  (MULTIVITAMIN) tablet, Take 1 tablet by mouth daily., Disp: , Rfl:  .  Oxycodone HCl 10 MG TABS, Take 1 tablet (10 mg total) by mouth every 6 (six) hours as needed., Disp: 60 tablet, Rfl: 0 .  prednisoLONE acetate (PRED FORTE) 1 % ophthalmic suspension, Place 1 drop into both eyes 4 (four) times daily., Disp: 5 mL, Rfl: 0  Physical exam:  Vitals:   11/08/18 1313  BP: 107/62  Pulse: 74  Resp: 20  Temp: 98.8 F (37.1 C)  TempSrc: Tympanic   Physical Exam Vitals signs and nursing note reviewed.  Constitutional:      General: He is not in acute distress.    Comments: accompanied  HENT:     Head: Normocephalic and atraumatic.     Mouth/Throat:     Mouth: Mucous membranes are moist.     Pharynx: Oropharynx is clear.  Eyes:     General: No scleral icterus.    Extraocular Movements: Extraocular movements intact.     Conjunctiva/sclera: Conjunctivae normal.     Pupils: Pupils are equal, round, and reactive to light.  Neck:     Musculoskeletal: Normal range of motion and neck supple.  Cardiovascular:     Rate and Rhythm: Normal rate and regular rhythm.  Pulmonary:     Effort: Pulmonary effort is normal.     Breath sounds: Normal breath sounds.  Skin:    General: Skin is warm and dry.     Comments: Axillary mass- dressing in place  Neurological:     Mental Status: He is alert and oriented to person, place, and time.  Psychiatric:        Mood and Affect: Mood normal.      CMP Latest Ref Rng & Units 11/08/2018  Glucose 70 - 99 mg/dL 126(H)  BUN 8 - 23 mg/dL 24(H)  Creatinine 0.61 - 1.24 mg/dL 1.01  Sodium 135 - 145 mmol/L 137  Potassium 3.5 - 5.1 mmol/L 4.0  Chloride 98 - 111 mmol/L 105  CO2 22 - 32 mmol/L 25  Calcium 8.9 - 10.3 mg/dL 7.6(L)  Total Protein 6.5 - 8.1 g/dL 5.4(L)  Total Bilirubin 0.3 - 1.2 mg/dL 0.3  Alkaline Phos 38 - 126 U/L 53  AST 15 - 41 U/L 13(L)  ALT 0 - 44 U/L 9   CBC Latest Ref Rng & Units 11/01/2018  WBC 4.0 - 10.5 K/uL 6.6  Hemoglobin 13.0 -  17.0 g/dL 7.3(L)  Hematocrit 39.0 - 52.0 % 24.7(L)  Platelets 150 - 400 K/uL 231    No images are attached  to the encounter.  Nm Pet Image Restage (ps) Whole Body  Result Date: 10/22/2018 CLINICAL DATA:  Subsequent treatment strategy for metastatic melanoma. EXAM: NUCLEAR MEDICINE PET WHOLE BODY TECHNIQUE: 7.82 mCi F-18 FDG was injected intravenously. Full-ring PET imaging was performed from the skull base to thigh after the radiotracer. CT data was obtained and used for attenuation correction and anatomic localization. Fasting blood glucose: 79 mg/dl COMPARISON:  PET-CT 07/19/2018 FINDINGS: Mediastinal blood pool activity: SUV max 2.31 HEAD/NECK: No neck mass or neck adenopathy. There is hypermetabolism in the left thyroid lobe with SUV max of 7.52. This was also present on the prior study although SUV max was only 4.37. This is likely due to thyroiditis. Incidental CT findings: none CHEST: Large persistent left axillary necrotic soft tissue mass measuring approximately 12.2 x 5.3 cm. It previously measured 7.5 x 6.6 cm. It appears to be grossly eroding through the skin and is now markedly exophytic in the lower axillary region. This portion measures 8.0 x 4.6 cm. SUV max is 17.33 in the non necrotic areas. There was previously 7.29. Moderate activity noted between the scapula and the ribs without discrete mass. This may be some type of inflammatory process. Stable 8 mm right-sided subcarinal lymph node is hypermetabolic with SUV max of 8.27. This was previously 4.58. Small right hilar node is also mildly hypermetabolic with SUV max of 0.78. This was previously 3.28. A few small scattered pulmonary nodules are stable. On the prior PET-CT there was a focal skin lesion with hypermetabolism involving the upper left posterior chest. This has resolved. Incidental CT findings: none ABDOMEN/PELVIS: No abnormal hypermetabolic activity within the liver, pancreas, adrenal glands, or spleen. No hypermetabolic lymph  nodes in the abdomen or pelvis. Incidental CT findings: none SKELETON: No focal hypermetabolic activity to suggest skeletal metastasis. Incidental CT findings: none EXTREMITIES: No abnormal hypermetabolic activity in the lower extremities. Incidental CT findings: none IMPRESSION: 1. Enlarging left axillary necrotic mass with a much bigger exophytic portion in the lower axilla. SUV max has increased from 7.29 to 17.33. 2. Stable small right hilar and right subcarinal lymph nodes with mild hypermetabolism. 3. A few small scattered pulmonary nodules are stable. No new or progressive findings. 4. No abdominal/pelvic metastatic disease or osseous metastatic disease. Electronically Signed   By: Marijo Sanes M.D.   On: 10/22/2018 13:40    Assessment and plan- Patient is a 68 y.o. male diagnosed with metastatic melanoma who [facility] for vision changes and near syncope.   1.  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 Progression on single agent BRAFi. currently on encorafenib/binimetinib per second opinion at [facility]. Not tolerating well (see below) but previously discussed options limited.    2. Vision changes- currently resolved with holding of medication. I suspect posterior uveitis secondary to binimetinib. Grade 1-2.  Patient scheduled to see ophthalmologist, Dr. Neville Route, tomorrow 11/09/2018 for more complete eye exam and evaluation of symptoms.  In interim, start pred forte eye drops 1 drop bilaterally 4 times a day.   3. Near Syncope- suspect secondary to medication and poor oral intake. BUN elevated today at 24. Cr 1.01. IV fluids given in clinic.   I suspect that his symptoms are secondary to medication.  Therefore after discussing with Dr. Grayland Ormond, recommend holding bini/enco for 1 week and having him follow-up with Dr. Grayland Ormond for reevaluation and consideration of dose  reduction at that time. Patient has  appointment with palliative care scheduled for 11/10/2018 and wishes to keep that previously scheduled appointment at this time.   Visit Diagnosis 1. Metastatic melanoma ([place])   2. Bilateral posterior uveitis   3. Near syncope     Patient expressed understanding and was in agreement with this plan. He also understands that He can call clinic at any time with any questions, concerns, or complaints.   Thank you for allowing me to participate in the care of this very pleasant patient.   Beckey Rutter, DNP, AGNP-C [facility] (work cell) [phone] (office)  Cc: Dr. Grayland Ormond

## 2018-11-08 NOTE — Telephone Encounter (Signed)
Patient reports that the new medicine he started on Friday is not agreeing with him, He is very weak, almost passing out, vomiting. He is asking for a dose modification. Please advise

## 2018-11-09 ENCOUNTER — Ambulatory Visit: Payer: Medicare Other | Admitting: Oncology

## 2018-11-09 ENCOUNTER — Other Ambulatory Visit: Payer: Self-pay

## 2018-11-09 ENCOUNTER — Ambulatory Visit (INDEPENDENT_AMBULATORY_CARE_PROVIDER_SITE_OTHER): Payer: Medicare Other | Admitting: Surgery

## 2018-11-09 ENCOUNTER — Encounter: Payer: Self-pay | Admitting: Nurse Practitioner

## 2018-11-09 ENCOUNTER — Encounter: Payer: Self-pay | Admitting: Surgery

## 2018-11-09 ENCOUNTER — Encounter: Payer: Medicare Other | Admitting: Hospice and Palliative Medicine

## 2018-11-09 ENCOUNTER — Other Ambulatory Visit: Payer: Medicare Other

## 2018-11-09 ENCOUNTER — Encounter: Payer: Medicare Other | Admitting: Occupational Therapy

## 2018-11-09 VITALS — BP 92/57 | HR 81 | Temp 97.5°F | Resp 22 | Ht 69.0 in | Wt 159.0 lb

## 2018-11-09 DIAGNOSIS — I96 Gangrene, not elsewhere classified: Secondary | ICD-10-CM

## 2018-11-09 DIAGNOSIS — S41102S Unspecified open wound of left upper arm, sequela: Secondary | ICD-10-CM

## 2018-11-09 DIAGNOSIS — H2511 Age-related nuclear cataract, right eye: Secondary | ICD-10-CM | POA: Diagnosis not present

## 2018-11-09 DIAGNOSIS — C439 Malignant melanoma of skin, unspecified: Secondary | ICD-10-CM

## 2018-11-09 DIAGNOSIS — C799 Secondary malignant neoplasm of unspecified site: Secondary | ICD-10-CM

## 2018-11-09 DIAGNOSIS — Z4889 Encounter for other specified surgical aftercare: Secondary | ICD-10-CM

## 2018-11-09 NOTE — Patient Instructions (Addendum)
The patient is aware to call back for any questions or new concerns. Continue dressing changes.

## 2018-11-09 NOTE — Progress Notes (Signed)
Surgical Clinic Progress/Follow-up Note   HPI:  68 y.o. Male presents to clinic for post-op follow-up 8 Days s/p re-excision of large necrotic Left axillary mass and sharp excisional debridement of Left axillary soft tissue/tumor necrosis Rosana Hoes, 11/01/2018). Patient reports much improved range of motion and decreased Left axillary pain compared with prior to surgery. Patient's wife describes daily dressing changes with +hemostasis. He says he attempted a new chemoterhapy with Dr. Grayland Ormond, but needed to discontinue this therapy due to severe adverse affects including diminished energy and overall weakness, since which he feels better today. He has otherwise been tolerating regular diet with +flatus and normal BM's, denies N/V, fever/chills, CP, or SOB. He recently enjoyed visiting his children and playing video games with his grandchildren. His wife has several questions regarding patient's chemotherapy.  Review of Systems:  Constitutional: denies fever/chills  Respiratory: denies shortness of breath, wheezing  Cardiovascular: denies chest pain, palpitations  Skin: Denies any other rashes or skin discolorations except post-surgical wounds as per interval history  Vital Signs:  BP (!) 92/57   Pulse 81   Temp (!) 97.5 F (36.4 C) (Skin)   Resp (!) 22   Ht 5\' 9"  (1.753 m)   Wt 159 lb (72.1 kg)   SpO2 96%   BMI 23.48 kg/m    Physical Exam:  Constitutional:  -- Normal body habitus  -- Awake, alert, and oriented x3  Pulmonary:  -- No crackles -- Equal breath sounds bilaterally -- Breathing non-labored at rest Cardiovascular:  -- S1, S2 present  -- No pericardial rubs   Musculoskeletal / Integumentary:  -- Wounds or skin discoloration: Dark irregular neoplastic currently hemostatic wound base with minimal tenderness to palpation, no surrounding erythema, a small amount of fibrinous exudate at wounds posterior margin without any fluctuance or signs of infection, improved odor  consistent with necrosis, albeit not completely resolved -- Extremities: B/L UE and LE FROM, hands and feet warm, +LUE lymphedema unchanged with compression stocking in place   Laboratory studies:  CBC Latest Ref Rng & Units 11/08/2018 11/01/2018 10/26/2018  WBC 4.0 - 10.5 K/uL 7.0 6.6 6.2  Hemoglobin 13.0 - 17.0 g/dL 7.3(L) 7.3(L) 6.1(L)  Hematocrit 39.0 - 52.0 % 24.7(L) 24.7(L) 21.1(L)  Platelets 150 - 400 K/uL 311 231 444(H)   Imaging: No new pertinent imaging available for review  Assessment:  68 y.o. yo Male with a problem list including...  Patient Active Problem List   Diagnosis Date Noted  . Localized tissue death ([place])   . Malignant melanoma of left upper extremity including shoulder ([place])   . Open wound of left axillary region with complication 29/56/2130  . Anemia 05/10/2018  . Ileus ([place])   . Constipation 10/10/2017  . Metastatic melanoma ([place]) 07/07/2017  . Axillary mass, left 06/26/2017  . Chronic pain of left knee 12/14/2015  . Influenza vaccination declined 11/27/2015    presents to clinic for post-op follow-up evaluation, doing overall well from at least a strictly surgical perspective 8 Days s/p re-excision of large necrotic Left axillary mass and sharp excisional debridement of Left axillary soft tissue/tumor necrosis Rosana Hoes, 11/01/2018), though with persistent metastatic melanoma, not responding well to systemic chemotherapies thus far.  Plan:              - advance diet as tolerated              - okay to shower with soap and water prn             -  continue current once daily dressing changes + prn  - instructed to call office if any questions or concerns  - patient encouraged to spend as much time as he is able doing activities he enjoys with close family and friends  - medical management as per oncology             - return to clinic as needed  All of the above recommendations were discussed with the patient and patient's wife, and all of patient's and  family's questions were answered to their expressed satisfaction.  -- Marilynne Drivers Rosana Hoes, MD, [place]: [facility] - Partnering for exceptional care. Office: [phone]

## 2018-11-10 ENCOUNTER — Ambulatory Visit: Payer: Medicare Other | Admitting: Occupational Therapy

## 2018-11-10 ENCOUNTER — Inpatient Hospital Stay: Payer: Medicare Other

## 2018-11-10 ENCOUNTER — Inpatient Hospital Stay: Payer: Medicare Other | Admitting: Oncology

## 2018-11-10 ENCOUNTER — Encounter: Payer: Self-pay | Admitting: Hospice and Palliative Medicine

## 2018-11-10 ENCOUNTER — Inpatient Hospital Stay (HOSPITAL_BASED_OUTPATIENT_CLINIC_OR_DEPARTMENT_OTHER): Payer: Medicare Other | Admitting: Hospice and Palliative Medicine

## 2018-11-10 DIAGNOSIS — R918 Other nonspecific abnormal finding of lung field: Secondary | ICD-10-CM

## 2018-11-10 DIAGNOSIS — Z515 Encounter for palliative care: Secondary | ICD-10-CM

## 2018-11-10 DIAGNOSIS — D649 Anemia, unspecified: Secondary | ICD-10-CM

## 2018-11-10 DIAGNOSIS — M129 Arthropathy, unspecified: Secondary | ICD-10-CM

## 2018-11-10 DIAGNOSIS — R531 Weakness: Secondary | ICD-10-CM | POA: Diagnosis not present

## 2018-11-10 DIAGNOSIS — C7951 Secondary malignant neoplasm of bone: Secondary | ICD-10-CM

## 2018-11-10 DIAGNOSIS — R0602 Shortness of breath: Secondary | ICD-10-CM

## 2018-11-10 DIAGNOSIS — Z9221 Personal history of antineoplastic chemotherapy: Secondary | ICD-10-CM

## 2018-11-10 DIAGNOSIS — I890 Lymphedema, not elsewhere classified: Secondary | ICD-10-CM | POA: Diagnosis not present

## 2018-11-10 DIAGNOSIS — C439 Malignant melanoma of skin, unspecified: Secondary | ICD-10-CM

## 2018-11-10 DIAGNOSIS — Z801 Family history of malignant neoplasm of trachea, bronchus and lung: Secondary | ICD-10-CM

## 2018-11-10 DIAGNOSIS — R5383 Other fatigue: Secondary | ICD-10-CM | POA: Diagnosis not present

## 2018-11-10 DIAGNOSIS — Z8042 Family history of malignant neoplasm of prostate: Secondary | ICD-10-CM

## 2018-11-10 DIAGNOSIS — G893 Neoplasm related pain (acute) (chronic): Secondary | ICD-10-CM

## 2018-11-10 DIAGNOSIS — C799 Secondary malignant neoplasm of unspecified site: Secondary | ICD-10-CM

## 2018-11-10 DIAGNOSIS — Z1509 Genetic susceptibility to other malignant neoplasm: Secondary | ICD-10-CM

## 2018-11-10 DIAGNOSIS — Z923 Personal history of irradiation: Secondary | ICD-10-CM

## 2018-11-10 DIAGNOSIS — C781 Secondary malignant neoplasm of mediastinum: Secondary | ICD-10-CM | POA: Diagnosis not present

## 2018-11-10 DIAGNOSIS — Z79899 Other long term (current) drug therapy: Secondary | ICD-10-CM

## 2018-11-10 DIAGNOSIS — C773 Secondary and unspecified malignant neoplasm of axilla and upper limb lymph nodes: Secondary | ICD-10-CM | POA: Diagnosis not present

## 2018-11-10 NOTE — Patient Instructions (Signed)
Cont to wear daytime compression sleeve daily to control arm swelling.   Do not sleep in compression sleeve. Utilize quilted HOS device loaned at prior session.  Try to perform natural L arm swing as much as possible when walking. Tuck L hand in pocket when dependent for long periods to limit swelling 2/2 gravity.  Carefully cleans skin and report any symptoms of infection to your doctor immediately.  Perform simple self-MLD PRN.

## 2018-11-10 NOTE — Therapy (Signed)
[place] MAIN [facility] SERVICES [address] [place], [place], [address] Phone: [phone]   Fax:  [phone]  Occupational Therapy Treatment Note and Progress Report  [facility]  Patient Details  Name: Louis Davies MRN: [id number] Date of Birth: 1951/01/07 No data recorded  Encounter Date: 11/10/2018  OT End of Session - 11/10/18 1048    Visit Number  30    Number of Visits  61    Date for OT Re-Evaluation  01/02/19    Authorization Type  visit 2 for 2020    Authorization Time Period  cont OT 2-3 x weekly x 12 weeks/ 36 visits to address LUE/LUQ secondary lymphedema and to facilitate wound healing     Authorization - Visit Number  1    Authorization - Number of Visits  63    OT Start Time  0910    OT Stop Time  1015    OT Time Calculation (min)  65 min    Activity Tolerance  Patient tolerated treatment well;Patient limited by pain;No increased pain    Behavior During Therapy  WFL for tasks assessed/performed       Past Medical History:  Diagnosis Date  . Anemia   . Arthritis   . Cancer ([place])    MELANOMA  . Complication of anesthesia   . Dyspnea    doe  . Perforated viscus   . PONV (postoperative nausea and vomiting)     Past Surgical History:  Procedure Laterality Date  . AXILLARY LYMPH NODE DISSECTION Left 06/26/2017   Procedure: EXCISION OF LEFT AXILLARY MASS;  Surgeon: Johnathan Hausen, MD;  Location: [facility];  Service: General;  Laterality: Left;  . AXILLARY LYMPH NODE DISSECTION Left 11/01/2018   Procedure: AXILLARY DEBRIDEMENT;  Surgeon: Vickie Epley, MD;  Location: [facility];  Service: General;  Laterality: Left;  . DIAGNOSTIC LAPAROSCOPY    . EMBOLIZATION Left 05/11/2018   Procedure: EMBOLIZATION;  Surgeon: Katha Cabal, MD;  Location: [facility];  Service: Cardiovascular;  Laterality: Left;  . LAPAROTOMY N/A 10/14/2017   Procedure: EXPLORATORY LAPAROTOMY;  Surgeon: Clayburn Pert,  MD;  Location: [facility];  Service: General;  Laterality: N/A;  . VASECTOMY    . WOUND DEBRIDEMENT Left 09/24/2018   Procedure: LEFT AXILLARY EXCISIONAL DEBRIDEMENT OF NECROTIC TISSUE;  Surgeon: Vickie Epley, MD;  Location: [facility];  Service: General;  Laterality: Left;    There were no vitals filed for this visit.  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  Pt reports recent surgery provided pain relief st tumor site. "It's already growing back though."    Limitations   LUE/ LUQ pain, swelling and odor from wound contribute to impaired basic and instrumental ADLs, limited participation in leisure pursuits and  work/ productive activities, impaired social participation, impaired role performance, impaired body image     Currently in Pain?  Yes    Pain Score  3     Pain Location  Axilla    Pain Orientation  Left    Pain Descriptors / Indicators  Guarding;Aching;Tender;Sore;Tightness;Heaviness;Discomfort;Constant;Tiring    Pain Type  Chronic pain    Pain Onset  Other (comment)   post surgical   Pain Frequency  Constant  OT Treatments/Exercises (OP) - 11/10/18 0001      ADLs   ADL Education Given  Yes      Manual Therapy   Manual Therapy  Edema management;Compression Bandaging;Manual Lymphatic Drainage (MLD)    Manual Lymphatic Drainage (MLD)  MLD to LUE/LUQ seated in a chair with BUE supported omn plintgh at comfortable end shoulder flexion range. Utilized short neck sequnce bilaterally. Utilized adjacent anterio and posterior anastomoses as well as ipsilateral axillary -inguinal pathways to offload edema towards functional pathways. Pt encouraged throughout session to perform diaphragmatic breathing to stimulate deep lymph movement and for relaxation. 30 minutes session devoted to proximal to distal L arm and hand stokes.      Compression Bandaging  Pt donned sleeve independently after session. Pt required assistance to don upper body clothing.             OT Education - 11/10/18 1213    Education Details  Continued skilled Pt/caregiver education  And LE ADL training throughout visit for lymphedema self care/ home program, including compression wrapping, compression garment and device wear/care, lymphatic pumping ther ex, simple self-MLD, and skin care. Discussed progress towards goals.     Person(s) Educated  Patient    Methods  Explanation;Demonstration;Handout;Tactile cues;Verbal cues    Comprehension  Verbalized understanding;Need further instruction;Returned demonstration;Verbal cues required;Tactile cues required          OT Long Term Goals - 11/10/18 1049      OT LONG TERM GOAL #1   Title  Caregiver will be able to provide max A to apply compression wraps using light, gradient compression technique to control swelling when / if donning sleeve is no longer tolerated.     Baseline  Dependent    Time  6    Period  Days    Status  New    Target Date  12/10/18      OT LONG TERM GOAL #2   Title  Pt will demonstrate ability to perform modified simple self MLD to terminus at neck and clavicles, and LUE for optimal independence with this component of LE self-care.     Baseline  mod A    Time  3    Period  Days    Status  New    Target Date  12/01/18      OT LONG TERM GOAL #3   Title  Pt to achieve no less than 10% limb volume reduction in affected limb(s) during Intensive Phase CDT to control limb swelling, to improve tissue integrity and immune function, to improve ADLs performance and to improve functional mobility/ transfers.    Baseline  dependent    Time  12    Period  Weeks    Status  Deferred      OT LONG TERM GOAL #4   Title  Pt will achieve 100% compliance with daily LE self-care home program components, including proper skin care, simple self-MLD, gradient compression wraps/  garments, and therapeutic exercise to ensure optimal Intensive Phase limb volume reduction to expedite compression garment/ device fitting.    Baseline  Max A    Time  12    Period  Weeks    Status  Deferred      OT LONG TERM GOAL #5   Title  Pt will demonstrate competent use of assistive devices during LE self-care training to achieve pain free dressing, bathing and grooming .    Baseline  Max A    Time  12  Period  Weeks    Status  Deferred      OT LONG TERM GOAL #6   Title  Pt will retain optimal limb volume reductions achieved during Intensive Phase CDT with no more than 15% volume increase  without CG assistance to limit LE progression and further functional decline.    Baseline  Dependent    Time  3    Period  Months    Status  Revised            Plan - 11/10/18 1213    Clinical Impression Statement  Pt initially met  10% limb volume reduction goal and compression wrapping goal. At present he is 100% compliant with custom compression sleeve, when pain       is controlled well enough to enable donning and doffingf. Arm swelling fluctuataes each visist, and continues to respond well to manual therapy. Tiss density and swelling reduce by end of session providing analgesic effect for Pt at trunk as well as arm. Despite volume fluctuation, Pt has sustained some clinical gains for volume reduction. As metastatic disease progresses in axilla I anticipate swelling to increase over time. For this reason CG trainiing for self-MLD and compression wrapping are added to POC today to enable care at home on days Pt feels too unwell to attend clinic visits. Cont 2 x weekly OT and PRN for LE management.    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    OT Frequency  3x / week    OT Duration  Other (comment)   ongoing as palliative measure 2/2 advanced cancer and worsening LE w/ functional decline   OT Treatment/Interventions  Self-care/ADL training;Therapeutic exercise;Manual lymph drainage;Compression bandaging;Patient/family education;Other (comment);Energy conservation;Therapeutic activities;Balance training;DME and/or AE instruction;Manual Therapy;Passive range of motion;Coping strategies training   Assist Pt with modification of TShirts with velcro cosures at shoulder and side seams to reduce pain     when dressing. Provide additional assistive device resources PRN.   Clinical Decision Making  Multiple treatment options, significant modification of task necessary    Recommended Other Services  fit with custom, clat knit LUE compression sleeve and HOS device. Consider seamless compression glove and vest. Consider kinesiotape during Intensive Phase    Consulted and Agree with Plan of Care  Patient       Patient will benefit from skilled therapeutic intervention in order to improve the following deficits and impairments:  Decreased endurance, Decreased mobility, Decreased skin integrity, Impaired sensation, Decreased knowledge of precautions, Decreased range of motion, Decreased scar mobility, Increased edema, Pain, Decreased activity tolerance, Decreased knowledge of use of DME, Decreased strength, Impaired flexibility, Impaired UE functional use, Decreased psychosocial skills, Decreased balance, Other (comment)(balance and gait negatively impacted by guarding  posture , impaired armswing and body asymmetry)  Visit Diagnosis: Lymphedema    Problem List Patient Active Problem List   Diagnosis  Date Noted  . Localized tissue death ([place])   . 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 Open wound of left axillary region with complication 09/73/5329  . Anemia 05/10/2018  .  Ileus ([place])   . Constipation 10/10/2017  . Metastatic melanoma ([place]) 07/07/2017  . Axillary mass, left 06/26/2017  . Chronic pain of left knee 12/14/2015  . Influenza vaccination declined 11/27/2015    Andrey Spearman, MS, OTR/L, [facility] 11/10/18 12:20 PM   [place] MAIN [facility] SERVICES [address] [place], [place], [address] Phone: [phone]   Fax:  [phone]  Name: Louis Davies MRN: [id number] Date of Birth: 1951-01-31

## 2018-11-10 NOTE — Progress Notes (Signed)
[place]  [phone] [phone]   Name: Louis Davies Date: 11/10/2018 MRN: [id number]  DOB: 1951-02-23  Patient Care Team: Alvera Singh, FNP as PCP - General (Family Medicine)    REASON FOR CONSULTATION: Palliative Care consult requested for this 68 y.o. 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 He is also s/p XRT and chemotx. Previous PET scan results from September 2019 reveal improved disease.  Patient's care has been complicated by left extremity swelling managed by lymphedema clinic and left axilla wound managed at the wound center.  He also underwent recent surgical debridement of the wound. Palliative care was asked to help address goals of care.  SOCIAL HISTORY:    Patient lives alone.  He has two daughters and a son.  He works as a Games developer.  ADVANCE DIRECTIVES:  Has a living will and daughter is HCPOA.  CODE STATUS: DNR  PAST MEDICAL HISTORY: Past Medical History:  Diagnosis Date  . Anemia   . Arthritis   . Cancer ([place])    MELANOMA  . Complication of anesthesia   . Dyspnea    doe  . Perforated viscus   . PONV (postoperative nausea and vomiting)     PAST SURGICAL HISTORY:  Past Surgical History:  Procedure Laterality Date  . AXILLARY LYMPH NODE DISSECTION Left 06/26/2017   Procedure: EXCISION OF LEFT AXILLARY MASS;  Surgeon: Johnathan Hausen, MD;  Location: [facility];  Service: General;  Laterality: Left;  . AXILLARY LYMPH NODE DISSECTION Left 11/01/2018   Procedure: AXILLARY DEBRIDEMENT;  Surgeon: Vickie Epley, MD;  Location: [facility];  Service: General;  Laterality: Left;  . DIAGNOSTIC LAPAROSCOPY    . EMBOLIZATION Left 05/11/2018   Procedure: EMBOLIZATION;  Surgeon: Katha Cabal, MD;  Location:  [facility];  Service: Cardiovascular;  Laterality: Left;  . LAPAROTOMY N/A 10/14/2017   Procedure: EXPLORATORY LAPAROTOMY;  Surgeon: Clayburn Pert, MD;  Location: [facility];  Service: General;  Laterality: N/A;  . VASECTOMY    . WOUND DEBRIDEMENT Left 09/24/2018   Procedure: LEFT AXILLARY EXCISIONAL DEBRIDEMENT OF NECROTIC TISSUE;  Surgeon: Vickie Epley, MD;  Location: [facility];  Service: General;  Laterality: Left;    HEMATOLOGY/ONCOLOGY HISTORY:  Oncology History   Patient initially  noticed an enlarging nodule in his left axilla approximately in July 2018. Subsequent excisional biopsy revealed metastatic melanoma. Patient had a lesion removed from his left back back in 2016 but otherwise has noted no other problems.  Had Pet Scan in Sept 2019 revealing metastatic disease. BRAF mutation is positive.    MRI of Brain revealed no metastatic disease.   Began with  immunotherapy using ipilumimab and nivolumab every 3 weeks for 4 cycles and then continue with maintenance nivomumab every 2 weeks until progression of disease.  - Ipilimimab & nivolumab complicated by small intestine perforation. Immunotherapy discontinued 10/2017.   Started on 960 mg Zelboraf (10/2017) daily. BRAF mutation is positive.  Dose reduced to 770m BID in March 2019 d/t side effects including nausea, vomiting, weight loss and abdominal bloating.   [place]  Restarted at reduced dose of 480 mg in May 2019.  Had restaging PET scan may 2019 revealed esponse to therapy, as evidenced by decreased size and hypermetabolism  of dominant left axillary mass. Other thoracic nodes were improved and resolved in hypermetabolism as detailed above. 2. Decreased hypermetabolism within indeterminate left iliac lesion  On 04/26/18 patient noted enlarging axillary mass and was concerned. CT chest ordered revelaing a large left axillary mass that had dramatically  increased in size, measuring over 5 times the volume that it did on 03/04/2018. Appearance compatible with rapid growth of malignancy.  - 05/2018 s/p XRT to left axillary mass - 06/2018 PET CT revealing dominant left axillary mass with small ulcerative lesion within the left posterior chest and 2 cm lucent lesion of the left iliac bone -11/29 evaluation for chronic painful left axillary wound and thought to be 2/2 tumor necrosis was treated with Keflex and supportive wound care with no improvement 09/24/2018-debridement and excision of large necrotic mass followed by rapid enlargement of tumor 10/16/2018 [facility] ER for management of left axillary mass and associated pain/infection.  CT chest revealing postsurgical changes in left axilla with heterogeneous density with superimposed infection.  No extension in the thoracic cavity.  Stable small bilateral pulmonary nodules measuring up to 4 mm several of which are calcified suggestive of prior granulomatosis disease -10/25/2017 evaluated by Dr. Trenda Moots - Initiated encorafenib and binimetinib on 11/01/2018     Metastatic melanoma ([place])   07/07/2017 Initial Diagnosis    Metastatic melanoma ([place])     ALLERGIES:  has No Known Allergies.  MEDICATIONS:  Current Outpatient Medications  Medication Sig Dispense Refill  . Binimetinib (MEKTOVI) 15 MG TABS Take 45 mg by mouth 2 (two) times daily. 180 tablet 2  . ibuprofen (ADVIL,MOTRIN) 200 MG tablet Take 200 mg by mouth every 6 (six) hours as needed.    . Multiple Vitamin (MULTIVITAMIN) tablet Take 1 tablet by mouth daily.    . Oxycodone HCl 10 MG TABS Take 1 tablet (10 mg total) by mouth every 6 (six) hours as needed. 60 tablet 0  . prednisoLONE acetate (PRED FORTE) 1 % ophthalmic suspension Place 1 drop into both eyes 4 (four) times daily. 5 mL 0  . Encorafenib 75 MG CAPS Take 450 mg by mouth daily. (Patient not taking: Reported on 11/10/2018) 180 capsule 2   No current facility-administered  medications for this visit.     VITAL SIGNS: There were no vitals taken for this visit. There were no vitals filed for this visit.  Estimated body mass index is 23.48 kg/m as calculated from the following:   Height as of 11/09/18: '5\' 9"'  (1.753 m).   Weight as of 11/09/18: 159 lb (72.1 kg).  LABS: CBC:    Component Value Date/Time   WBC 7.0 11/08/2018 1251   HGB 7.3 (L) 11/08/2018 1251   HCT 24.7 (L) 11/08/2018 1251   PLT 311 11/08/2018 1251   MCV 79.9 (L) 11/08/2018 1251   NEUTROABS 5.3 11/08/2018 1251   LYMPHSABS 1.0 11/08/2018 1251   MONOABS 0.4 11/08/2018 1251   EOSABS 0.2 11/08/2018 1251   BASOSABS 0.0 11/08/2018 1251   Comprehensive Metabolic Panel:    Component Value Date/Time   NA 137 11/08/2018 1251   K 4.0 11/08/2018 1251   CL 105 11/08/2018 1251   CO2 25 11/08/2018 1251   BUN 24 (H) 11/08/2018 1251   CREATININE 1.01 11/08/2018 1251   GLUCOSE 126 (H) 11/08/2018 1251   CALCIUM 7.6 (L) 11/08/2018 1251   AST 13 (L) 11/08/2018 1251   ALT 9 11/08/2018 1251   ALKPHOS 53 11/08/2018 1251   BILITOT 0.3 11/08/2018 1251  PROT 5.4 (L) 11/08/2018 1251   ALBUMIN 2.6 (L) 11/08/2018 1251    RADIOGRAPHIC STUDIES: Nm Pet Image Restage (ps) Whole Body  Result Date: 10/22/2018 CLINICAL DATA:  Subsequent treatment strategy for metastatic melanoma. EXAM: NUCLEAR MEDICINE PET WHOLE BODY TECHNIQUE: 7.82 mCi F-18 FDG was injected intravenously. Full-ring PET imaging was performed from the skull base to thigh after the radiotracer. CT data was obtained and used for attenuation correction and anatomic localization. Fasting blood glucose: 79 mg/dl COMPARISON:  PET-CT 07/19/2018 FINDINGS: Mediastinal blood pool activity: SUV max 2.31 HEAD/NECK: No neck mass or neck adenopathy. There is hypermetabolism in the left thyroid lobe with SUV max of 7.52. This was also present on the prior study although SUV max was only 4.37. This is likely due to thyroiditis. Incidental CT findings: none CHEST:  Large persistent left axillary necrotic soft tissue mass measuring approximately 12.2 x 5.3 cm. It previously measured 7.5 x 6.6 cm. It appears to be grossly eroding through the skin and is now markedly exophytic in the lower axillary region. This portion measures 8.0 x 4.6 cm. SUV max is 17.33 in the non necrotic areas. There was previously 7.29. Moderate activity noted between the scapula and the ribs without discrete mass. This may be some type of inflammatory process. Stable 8 mm right-sided subcarinal lymph node is hypermetabolic with SUV max of 3.61. This was previously 4.58. Small right hilar node is also mildly hypermetabolic with SUV max of 4.43. This was previously 3.28. A few small scattered pulmonary nodules are stable. On the prior PET-CT there was a focal skin lesion with hypermetabolism involving the upper left posterior chest. This has resolved. Incidental CT findings: none ABDOMEN/PELVIS: No abnormal hypermetabolic activity within the liver, pancreas, adrenal glands, or spleen. No hypermetabolic lymph nodes in the abdomen or pelvis. Incidental CT findings: none SKELETON: No focal hypermetabolic activity to suggest skeletal metastasis. Incidental CT findings: none EXTREMITIES: No abnormal hypermetabolic activity in the lower extremities. Incidental CT findings: none IMPRESSION: 1. Enlarging left axillary necrotic mass with a much bigger exophytic portion in the lower axilla. SUV max has increased from 7.29 to 17.33. 2. Stable small right hilar and right subcarinal lymph nodes with mild hypermetabolism. 3. A few small scattered pulmonary nodules are stable. No new or progressive findings. 4. No abdominal/pelvic metastatic disease or osseous metastatic disease. Electronically Signed   By: Marijo Sanes M.D.   On: 10/22/2018 13:40    PERFORMANCE STATUS (ECOG) : 1 - Symptomatic but completely ambulatory  Review of Systems As noted above. Otherwise, a complete review of systems is  negative.  Physical Exam General: NAD, frail appearing, thin Cardiovascular: regular rate and rhythm Pulmonary: Unlabored Extremities: no edema, no joint deformities Skin: Large circular wound to the left axilla with some purulent drainage, has roughly 5 cm circular nodule superior to existing wound with indurated Eli Adami and semi-fluctuant center without erythema/drainage/warmth Neurological: Weakness but otherwise nonfocal  IMPRESSION: Patient was seen today in the clinic for routine follow-up.  He was accompanied by his friend and youngest daughter.  Patient underwent recent surgical debridement of the left axilla wound on 11/01/2018 by Dr. Rosana Hoes.  Patient reports doing well post procedure.   He was recently started on encorafenib & binimetinib and was subsequently seen in the symptom management clinic on 11/08/2018 for visual changes and dizziness.  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  Treatment was discontinued.  Patient reports that he saw  ophthalmology yesterday and no acute findings were revealed on exam.  Patient subsequently says his symptoms have improved following discontinuation of treatment.    Patient has follow-up scheduled with Dr. Grayland Ormond to discuss future treatment options.  However, patient admits that he does not feel there will likely be many future options available.  His friend says there are several herbal remedies and topical ointments that they want to try.  Despite understanding that treatment options may be limited, patient repeatedly says "I am not ready to give up".  He further states that he knows he will likely die at some point from the cancer but wants to live "a few more years."  Symptomatically, patient says he is doing reasonably well.  He has occasional pain for which he takes as needed oxycodone.  He is currently taking oxycodone twice a day (in the morning and at bedtime).   However, he has had some constipation related to the opioids and would like to reduce the frequency they have required if possible.  He is also taken ibuprofen with good effect.  I suggested that he could try ibuprofen in combination with acetaminophen.  Patient reported a good appetite.  However, family is concerned that he is not gaining weight.  It does appear that he has slowly trended down with his weight since November when he was in the 170s and is now in the 150s.  He says he eats a fair amount during the day and is frequently hungry.  When I explored what he was eating he said he often will eat plates of vegetables and vegetable juices.  I encouraged him to focus on high-calorie and high-protein foods with smaller portions during the day.  I am not sure that patient would benefit at the present time from an appetite stimulant but will instead refer him back to see our dietitian.  We discussed advance care planning and CODE STATUS at length.  Patient does have a living will and thinks he is appointed his oldest daughter to be his healthcare power of attorney.  He intends to review this paperwork to make sure.  We do have a living will on file dated 06/03/2018 but do not have a healthcare power of attorney.  Patient says that he would not want to be resuscitated or have his life prolonged artificially on machines.  He would be agreeable to hospitalization if needed to treat the treatable but would not want overly aggressive care if it became apparent that he had little chance of meaningful improvement.  He would be okay with IV fluids and antibiotics if needed.  He was unsure about a feeding tube and wanted to defer that to his family.   I completed a MOST form today. The patient and family outlined their wishes for the following treatment decisions:  Cardiopulmonary Resuscitation: Do Not Attempt Resuscitation (DNR/No CPR)  Medical Interventions: Limited Additional Interventions: Use medical  treatment, IV fluids and cardiac monitoring as indicated, DO NOT USE intubation or mechanical ventilation. May consider use of less invasive airway support such as BiPAP or CPAP. Also provide comfort measures. Transfer to the hospital if indicated. Avoid intensive care.   Antibiotics: Determine use of limitation of antibiotics when infection occurs  IV Fluids: IV fluids if indicated  Feeding Tube: Left Blank    PLAN: Treatment plan per oncology DNR MOST form completed Referral to see RD RTC in 1 month or sooner if needed   Patient expressed understanding and was in agreement with this plan.  He also understands that He can call clinic at any time with any questions, concerns, or complaints.    Time Total: 45 minutes  Visit consisted of counseling and education dealing with the complex and emotionally intense issues of symptom management and palliative care in the setting of serious and potentially life-threatening illness.Greater than 50%  of this time was spent counseling and coordinating care related to the above assessment and plan.  Signed by: Altha Harm, PhD, DNP, NP-C, [facility] [phone] (Work Cell)

## 2018-11-11 ENCOUNTER — Ambulatory Visit: Payer: Medicare Other | Admitting: Occupational Therapy

## 2018-11-11 ENCOUNTER — Encounter: Payer: Medicare Other | Admitting: Occupational Therapy

## 2018-11-11 DIAGNOSIS — I890 Lymphedema, not elsewhere classified: Secondary | ICD-10-CM | POA: Diagnosis not present

## 2018-11-11 NOTE — Therapy (Signed)
[place] MAIN [facility] SERVICES [address] [place], [place], [address] Phone: [phone]   Fax:  [phone]  Occupational Therapy Treatment  Patient Details  Name: Louis Davies MRN: [id number] Date of Birth: [birth year] No data recorded  Encounter Date: 11/11/2018  OT End of Session - 11/11/18 1724    Visit Number  31    Number of Visits  27    Date for OT Re-Evaluation  01/02/19    Authorization Type  visit 3 for 2020    Authorization Time Period  cont OT 2-3 x weekly x 12 weeks/ 36 visits to address LUE/LUQ secondary lymphedema and to facilitate wound healing     Authorization - Visit Number  1    Authorization - Number of Visits  48    OT Start Time  1105    OT Stop Time  1215    OT Time Calculation (min)  70 min    Activity Tolerance  Patient tolerated treatment well;Patient limited by pain;No increased pain    Behavior During Therapy  WFL for tasks assessed/performed       Past Medical History:  Diagnosis Date  . Anemia   . Arthritis   . Cancer ([place])    MELANOMA  . Complication of anesthesia   . Dyspnea    doe  . Perforated viscus   . PONV (postoperative nausea and vomiting)     Past Surgical History:  Procedure Laterality Date  . AXILLARY LYMPH NODE DISSECTION Left 06/26/2017   Procedure: EXCISION OF LEFT AXILLARY MASS;  Surgeon: Johnathan Hausen, MD;  Location: [facility] OR[facility];  Service: General;  Laterality: Left;  . AXILLARY LYMPH NODE DISSECTION Left 11/01/2018   Procedure: AXILLARY DEBRIDEMENT;  Surgeon: Vickie Epley, MD;  Location: [facility] OR[facility];  Service: General;  Laterality: Left;  . DIAGNOSTIC LAPAROSCOPY    . EMBOLIZATION Left 05/11/2018   Procedure: EMBOLIZATION;  Surgeon: Katha Cabal, MD;  Location: [place] [facility];  Service: Cardiovascular;  Laterality: Left;  . LAPAROTOMY N/A 10/14/2017   Procedure: EXPLORATORY LAPAROTOMY;  Surgeon: Clayburn Pert, MD;  Location: [facility] ORS;  Service: General;   Laterality: N/A;  . VASECTOMY    . WOUND DEBRIDEMENT Left 09/24/2018   Procedure: LEFT AXILLARY EXCISIONAL DEBRIDEMENT OF NECROTIC TISSUE;  Surgeon: Vickie Epley, MD;  Location: [facility] ORS;  Service: General;  Laterality: Left;    There were no vitals filed for this visit.  Subjective Assessment - 11/11/18 1722    Subjective   Mr. 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  Pt  drove himself to clinic today. He slept for >30 min of 70 minute Rx session. Axillary wound dressing in place. Odor is obious today.  Pt states, "I fell pretty good today."    Limitations   LUE/ LUQ pain, swelling and odor from wound contribute to impaired basic and instrumental ADLs, limited participation in leisure pursuits and  work/ productive activities, impaired social participation, impaired role performance, impaired body image     Currently in Pain?  Yes    Pain Score  --   not rated numerically   Pain Location  Axilla    Pain Orientation  Left    Pain Descriptors / Indicators  Aching;Tender;Pressure;Sore;Constant;Discomfort;Heaviness;Tightness;Penetrating;Grimacing;Tiring    Pain Type  Chronic pain    Pain Onset  Other (comment)   post surgical   Pain Frequency  Constant  OT Treatments/Exercises (OP) - 11/11/18 1728      ADLs   ADL Education Given  --   no     Manual Therapy   Manual Therapy  Edema management;Compression Bandaging;Manual Lymphatic Drainage (MLD)    Edema Management  Pt able to dress and undress himself without assistance today.     Manual Lymphatic Drainage (MLD)  MLD to LUE/LUQ seated in a chair with BUE supported omn plintgh at comfortable end shoulder flexion range. Utilized short neck sequnce bilaterally. Utilized adjacent anterio and posterior anastomoses as well as ipsilateral axillary -inguinal pathways to offload edema towards functional pathways. Pt encouraged throughout  session to perform diaphragmatic breathing to stimulate deep lymph movement and for relaxation. 30 minutes session devoted to proximal to distal L arm and hand stokes.     Compression Bandaging  Pt donned sleeve independently after session. Pt required assistance to don upper body clothing.             OT Education - 11/11/18 1730    Education Details  no sefl-care training today. Pt slept through much of session    Person(s) Educated  Patient    Methods  Explanation;Demonstration;Handout;Tactile cues;Verbal cues    Comprehension  Verbalized understanding;Need further instruction;Returned demonstration;Verbal cues required;Tactile cues required          OT Long Term Goals - 11/10/18 1049      OT LONG TERM GOAL #1   Title  Caregiver will be able to provide max A to apply compression wraps using light, gradient compression technique to control swelling when / if donning sleeve is no longer tolerated.     Baseline  Dependent    Time  6    Period  Days    Status  New    Target Date  12/10/18      OT LONG TERM GOAL #2   Title  Pt will demonstrate ability to perform modified simple self MLD to terminus at neck and clavicles, and LUE for optimal independence with this component of LE self-care.     Baseline  mod A    Time  3    Period  Days    Status  New    Target Date  12/01/18      OT LONG TERM GOAL #3   Title  Pt to achieve no less than 10% limb volume reduction in affected limb(s) during Intensive Phase CDT to control limb swelling, to improve tissue integrity and immune function, to improve ADLs performance and to improve functional mobility/ transfers.    Baseline  dependent    Time  12    Period  Weeks    Status  Deferred      OT LONG TERM GOAL #4   Title  Pt will achieve 100% compliance with daily LE self-care home program components, including proper skin care, simple self-MLD, gradient compression wraps/ garments, and therapeutic exercise to ensure optimal  Intensive Phase limb volume reduction to expedite compression garment/ device fitting.    Baseline  Max A    Time  12    Period  Weeks    Status  Deferred      OT LONG TERM GOAL #5   Title  Pt will demonstrate competent use of assistive devices during LE self-care training to achieve pain free dressing, bathing and grooming .    Baseline  Max A    Time  12    Period  Weeks    Status  Deferred  OT LONG TERM GOAL #6   Title  Pt will retain optimal limb volume reductions achieved during Intensive Phase CDT with no more than 15% volume increase  without CG assistance to limit LE progression and further functional decline.    Baseline  Dependent    Time  3    Period  Months    Status  Revised            Plan - 11/11/18 1731    Clinical Impression Statement  Pt tolerated manual therapy . He reports pain reduction at L axilla, shoulder and arm after session. L forearm densly swollen today with thickened, protein rich edema- 3+ pitting. Pain appears better managed today with Pt taking pain medication before arriving for OT. Cont as per POC. Cont to work on palliative goal of sweling reduction, pain releif and improved functional hand and arm use.    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    OT Frequency  3x / week    OT Duration  Other (comment)   ongoing as palliative measure 2/2 advanced cancer and worsening LE w/ functional decline   OT Treatment/Interventions  Self-care/ADL  training;Therapeutic exercise;Manual lymph drainage;Compression bandaging;Patient/family education;Other (comment);Energy conservation;Therapeutic activities;Balance training;DME and/or AE instruction;Manual Therapy;Passive range of motion;Coping strategies training   Assist Pt with modification of TShirts with velcro cosures at shoulder and side seams to reduce pain     when dressing. Provide additional assistive device resources PRN.   Clinical Decision Making  Multiple treatment options, significant modification of task necessary    Recommended Other Services  fit with custom, clat knit LUE compression sleeve and HOS device. Consider seamless compression glove and vest. Consider kinesiotape during Intensive Phase    Consulted and Agree with Plan of Care  Patient       Patient will benefit from skilled therapeutic intervention in order to improve the following deficits and impairments:  Decreased endurance, Decreased mobility, Decreased skin integrity, Impaired sensation, Decreased knowledge of precautions, Decreased range of motion, Decreased scar mobility, Increased edema, Pain, Decreased activity tolerance, Decreased knowledge of use of DME, Decreased strength, Impaired flexibility, Impaired UE functional use, Decreased psychosocial skills, Decreased balance, Other (comment)(balance and gait negatively impacted by guarding  posture , impaired armswing and body asymmetry)  Visit Diagnosis: Lymphedema, not elsewhere classified    Problem List Patient Active Problem List   Diagnosis Date Noted  . Localized tissue death ([place])   . 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 Open wound of left axillary region with complication 19/50/9326  . Anemia 05/10/2018  . Ileus ([place])   . Constipation 10/10/2017  . Metastatic melanoma ([place]) 07/07/2017  . Axillary mass, left 06/26/2017  . Chronic pain of left knee 12/14/2015  . Influenza vaccination declined 11/27/2015     Andrey Spearman, MS, OTR/L, [facility] 11/11/18 5:35 PM  [place] MAIN [facility] SERVICES [address] [place], [place], [address] Phone: [phone]   Fax:  [phone]  Name: Louis Davies MRN: [id number] Date of Birth: [birth year]

## 2018-11-12 ENCOUNTER — Encounter: Payer: Self-pay | Admitting: Surgery

## 2018-11-14 NOTE — Progress Notes (Signed)
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  INTERVAL HISTORY: Patient returns to clinic today for further evaluation and reinitiation of dose reduced encorafenib/binimetinib.  Patient states he took 1 day of treatment and felt significantly fatigued and was "in bed" most of the day.  He subsequently discontinued treatment and now feels back to his baseline.  He also recently had repeat debridement of his left axillary disease. His weakness and fatigue are unchanged.  He continues to have pain.  He has no neurologic complaints. He denies any recent fevers or illnesses. He has no chest pain, cough, or hemoptysis. He denies any nausea, vomiting, constipation, or diarrhea. He has no melena or hematochezia. He has no urinary complaints.  Patient offers no further specific complaints today.    REVIEW OF SYSTEMS:   Review of Systems  Constitutional: Positive for malaise/fatigue. Negative for fever and weight loss.  Respiratory: Negative.  Negative for cough and shortness of breath.   Cardiovascular: Negative.  Negative for chest pain and leg swelling.  Gastrointestinal: Negative.  Negative for abdominal pain, blood in stool, melena, nausea and vomiting.  Genitourinary: Negative.  Negative for dysuria.  Musculoskeletal: Negative.  Negative for myalgias.  Skin: Negative.  Negative for rash.  Neurological: Positive for weakness. Negative for sensory change and focal weakness.  Psychiatric/Behavioral: Negative.  The patient is not nervous/anxious.     As per HPI. Otherwise, a complete review of systems is negative.  PAST MEDICAL HISTORY: Past Medical History:  Diagnosis Date  . Anemia   . Arthritis   . Cancer ([place])    MELANOMA  .  Complication of anesthesia   . Dyspnea    doe  . Perforated viscus   . PONV (postoperative nausea and vomiting)     PAST SURGICAL HISTORY: Past Surgical History:  Procedure Laterality Date  . AXILLARY LYMPH NODE DISSECTION Left 06/26/2017   Procedure: EXCISION OF LEFT AXILLARY MASS;  Surgeon: Johnathan Hausen, MD;  Location: [facility] OR[facility];  Service: General;  Laterality: Left;  . AXILLARY LYMPH NODE DISSECTION Left 11/01/2018   Procedure: AXILLARY DEBRIDEMENT;  Surgeon: Vickie Epley, MD;  Location: [facility];  Service: General;  Laterality: Left;  . DIAGNOSTIC LAPAROSCOPY    . EMBOLIZATION Left 05/11/2018   Procedure: EMBOLIZATION;  Surgeon: Katha Cabal, MD;  Location: [place] [facility];  Service: Cardiovascular;  Laterality: Left;  . LAPAROTOMY N/A 10/14/2017   Procedure: EXPLORATORY LAPAROTOMY;  Surgeon: Clayburn Pert, MD;  Location: [facility];  Service: General;  Laterality: N/A;  . VASECTOMY    . WOUND DEBRIDEMENT Left 09/24/2018   Procedure: LEFT AXILLARY EXCISIONAL DEBRIDEMENT OF NECROTIC TISSUE;  Surgeon: Vickie Epley, MD;  Location: [facility] OR[facility];  Service: General;  Laterality: Left;    FAMILY HISTORY: Family History  Problem Relation Age of Onset  . Diabetes Mother   . Hypertension Mother   . Leukemia Father   . Thyroid disease Sister   . Liver disease Maternal Grandmother   . Heart Problems Maternal Grandfather   . Diabetes Maternal Grandfather   . Lung cancer Paternal Grandmother   . Prostate cancer Paternal Grandfather     ADVANCED DIRECTIVES (Y/N):  N  HEALTH MAINTENANCE: Social History   Tobacco Use  . Smoking status: Never Smoker  . Smokeless tobacco:  Never Used  Substance Use Topics  . Alcohol use: No  . Drug use: No     Colonoscopy:  PAP:  Bone density:  Lipid panel:  No Known Allergies  Current Outpatient Medications  Medication Sig Dispense Refill  . Binimetinib (MEKTOVI) 15 MG TABS Take 45 mg by mouth 2 (two) times daily. 180  tablet 2  . Encorafenib 75 MG CAPS Take 450 mg by mouth daily. (Patient not taking: Reported on 11/10/2018) 180 capsule 2  . ibuprofen (ADVIL,MOTRIN) 200 MG tablet Take 200 mg by mouth every 6 (six) hours as needed.    . Multiple Vitamin (MULTIVITAMIN) tablet Take 1 tablet by mouth daily.    . Oxycodone HCl 10 MG TABS Take 1 tablet (10 mg total) by mouth every 6 (six) hours as needed. 60 tablet 0  . prednisoLONE acetate (PRED FORTE) 1 % ophthalmic suspension Place 1 drop into both eyes 4 (four) times daily. 5 mL 0   No current facility-administered medications for this visit.     OBJECTIVE: There were no vitals filed for this visit.   There is no height or weight on file to calculate BMI.    ECOG FS:2 - Symptomatic, <50% confined to bed  General: Ill-appearing, no acute distress. Eyes: Pink conjunctiva, anicteric sclera. HEENT: Normocephalic, moist mucous membranes. Lungs: Clear to auscultation bilaterally. Heart: Regular rate and rhythm. No rubs, murmurs, or gallops. Abdomen: Soft, nontender, nondistended. No organomegaly noted, normoactive bowel sounds. Musculoskeletal: No edema, cyanosis, or clubbing. Neuro: Alert, answering all questions appropriately. Cranial nerves grossly intact. Skin: No rashes or petechiae noted. Psych: Normal affect. Lymphatics: Surgical dressing over left axillary mass.    LAB RESULTS:  Lab Results  Component Value Date   NA 137 11/08/2018   K 4.0 11/08/2018   CL 105 11/08/2018   CO2 25 11/08/2018   GLUCOSE 126 (H) 11/08/2018   BUN 24 (H) 11/08/2018   CREATININE 1.01 11/08/2018   CALCIUM 7.6 (L) 11/08/2018   PROT 5.4 (L) 11/08/2018   ALBUMIN 2.6 (L) 11/08/2018   AST 13 (L) 11/08/2018   ALT 9 11/08/2018   ALKPHOS 53 11/08/2018   BILITOT 0.3 11/08/2018   GFRNONAA >60 11/08/2018   GFRAA >60 11/08/2018    Lab Results  Component Value Date   WBC 7.0 11/08/2018   NEUTROABS 5.3 11/08/2018   HGB 7.3 (L) 11/08/2018   HCT 24.7 (L) 11/08/2018   MCV  79.9 (L) 11/08/2018   PLT 311 11/08/2018     STUDIES: Nm Pet Image Restage (ps) Whole Body  Result Date: 10/22/2018 CLINICAL DATA:  Subsequent treatment strategy for metastatic melanoma. EXAM: NUCLEAR MEDICINE PET WHOLE BODY TECHNIQUE: 7.82 mCi F-18 FDG was injected intravenously. Full-ring PET imaging was performed from the skull base to thigh after the radiotracer. CT data was obtained and used for attenuation correction and anatomic localization. Fasting blood glucose: 79 mg/dl COMPARISON:  PET-CT 07/19/2018 FINDINGS: Mediastinal blood pool activity: SUV max 2.31 HEAD/NECK: No neck mass or neck adenopathy. There is hypermetabolism in the left thyroid lobe with SUV max of 7.52. This was also present on the prior study although SUV max was only 4.37. This is likely due to thyroiditis. Incidental CT findings: none CHEST: Large persistent left axillary necrotic soft tissue mass measuring approximately 12.2 x 5.3 cm. It previously measured 7.5 x 6.6 cm. It appears to be grossly eroding through the skin and is now markedly exophytic in the lower axillary region. This portion measures 8.0 x 4.6 cm. SUV max  is 17.33 in the non necrotic areas. There was previously 7.29. Moderate activity noted between the scapula and the ribs without discrete mass. This may be some type of inflammatory process. Stable 8 mm right-sided subcarinal lymph node is hypermetabolic with SUV max of 6.43. This was previously 4.58. Small right hilar node is also mildly hypermetabolic with SUV max of 3.29. This was previously 3.28. A few small scattered pulmonary nodules are stable. On the prior PET-CT there was a focal skin lesion with hypermetabolism involving the upper left posterior chest. This has resolved. Incidental CT findings: none ABDOMEN/PELVIS: No abnormal hypermetabolic activity within the liver, pancreas, adrenal glands, or spleen. No hypermetabolic lymph nodes in the abdomen or pelvis. Incidental CT findings: none SKELETON: No  focal hypermetabolic activity to suggest skeletal metastasis. Incidental CT findings: none EXTREMITIES: No abnormal hypermetabolic activity in the lower extremities. Incidental CT findings: none IMPRESSION: 1. Enlarging left axillary necrotic mass with a much bigger exophytic portion in the lower axilla. SUV max has increased from 7.29 to 17.33. 2. Stable small right hilar and right subcarinal lymph nodes with mild hypermetabolism. 3. A few small scattered pulmonary nodules are stable. No new or progressive findings. 4. No abdominal/pelvic metastatic disease or osseous metastatic disease. Electronically Signed   By: Marijo Sanes M.D.   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  PLAN:    1. 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  [place] input.  Patient had repeat debridement of his left axillary mass revealing persistent melanoma.  Patient was initiated encorafenib 475m daily/binimetinib 442mBID and could not tolerate this dosing.  Patient has been instructed to reinitiate treatment with encorafenib 300 mg daily plus binimetinib 30 mg daily.  Patient will also require baseline cardiac echo in the near future.  Return to clinic in 2 weeks with repeat laboratory work and further evaluation. 2.  Left axilla: Appreciate [facility] and surgical input.  Follow-up with surgery as indicated. 3.  Left arm swelling: Significantly improved.  Continue follow-up with lymphedema clinic. 4.  Poor appetite: Improved.  Continue Megace as prescribed.  Appreciate dietary input. 5.  Anemia: Patient's hemoglobin has significantly trended down and will return tomorrow for 2 units packed red blood cells.  Patient expressed understanding and was in agreement with this plan. He also  understands that He can call clinic at any time with any questions, concerns, or complaints.   Cancer Staging Metastatic melanoma (HSpecialists In [facility] Surgery Center LLCStaging form: Melanoma of the Skin, AJCC 8th Edition - Clinical stage from 07/15/2017: Stage IV (cTX, cN1, cM1c(0)) - Signed by FiLloyd HugerMD on 07/15/2017   TiLloyd HugerMD   11/14/2018 10:18 PM

## 2018-11-15 ENCOUNTER — Inpatient Hospital Stay (HOSPITAL_BASED_OUTPATIENT_CLINIC_OR_DEPARTMENT_OTHER): Payer: Medicare Other | Admitting: Oncology

## 2018-11-15 ENCOUNTER — Other Ambulatory Visit: Payer: Self-pay

## 2018-11-15 ENCOUNTER — Ambulatory Visit: Payer: Medicare Other | Admitting: Occupational Therapy

## 2018-11-15 ENCOUNTER — Inpatient Hospital Stay: Payer: Medicare Other

## 2018-11-15 VITALS — BP 101/62 | HR 76 | Temp 97.3°F | Ht 69.0 in | Wt 164.6 lb

## 2018-11-15 DIAGNOSIS — C439 Malignant melanoma of skin, unspecified: Secondary | ICD-10-CM | POA: Diagnosis not present

## 2018-11-15 DIAGNOSIS — C799 Secondary malignant neoplasm of unspecified site: Secondary | ICD-10-CM

## 2018-11-15 DIAGNOSIS — D649 Anemia, unspecified: Secondary | ICD-10-CM | POA: Diagnosis not present

## 2018-11-15 DIAGNOSIS — Z1509 Genetic susceptibility to other malignant neoplasm: Secondary | ICD-10-CM | POA: Diagnosis not present

## 2018-11-15 DIAGNOSIS — Z8042 Family history of malignant neoplasm of prostate: Secondary | ICD-10-CM

## 2018-11-15 DIAGNOSIS — C781 Secondary malignant neoplasm of mediastinum: Secondary | ICD-10-CM | POA: Diagnosis not present

## 2018-11-15 DIAGNOSIS — C773 Secondary and unspecified malignant neoplasm of axilla and upper limb lymph nodes: Secondary | ICD-10-CM | POA: Diagnosis not present

## 2018-11-15 DIAGNOSIS — Z9221 Personal history of antineoplastic chemotherapy: Secondary | ICD-10-CM

## 2018-11-15 DIAGNOSIS — M129 Arthropathy, unspecified: Secondary | ICD-10-CM | POA: Diagnosis not present

## 2018-11-15 DIAGNOSIS — Z515 Encounter for palliative care: Secondary | ICD-10-CM | POA: Diagnosis not present

## 2018-11-15 DIAGNOSIS — Z923 Personal history of irradiation: Secondary | ICD-10-CM

## 2018-11-15 DIAGNOSIS — R918 Other nonspecific abnormal finding of lung field: Secondary | ICD-10-CM

## 2018-11-15 DIAGNOSIS — Z79899 Other long term (current) drug therapy: Secondary | ICD-10-CM | POA: Diagnosis not present

## 2018-11-15 DIAGNOSIS — Z801 Family history of malignant neoplasm of trachea, bronchus and lung: Secondary | ICD-10-CM

## 2018-11-15 DIAGNOSIS — R5383 Other fatigue: Secondary | ICD-10-CM | POA: Diagnosis not present

## 2018-11-15 DIAGNOSIS — R531 Weakness: Secondary | ICD-10-CM | POA: Diagnosis not present

## 2018-11-15 DIAGNOSIS — I890 Lymphedema, not elsewhere classified: Secondary | ICD-10-CM | POA: Diagnosis not present

## 2018-11-15 DIAGNOSIS — C7951 Secondary malignant neoplasm of bone: Secondary | ICD-10-CM | POA: Diagnosis not present

## 2018-11-15 LAB — COMPREHENSIVE METABOLIC PANEL
ALT: 8 U/L (ref 0–44)
AST: 12 U/L — ABNORMAL LOW (ref 15–41)
Albumin: 2.3 g/dL — ABNORMAL LOW (ref 3.5–5.0)
Alkaline Phosphatase: 36 U/L — ABNORMAL LOW (ref 38–126)
Anion gap: 7 (ref 5–15)
BUN: 17 mg/dL (ref 8–23)
CO2: 25 mmol/L (ref 22–32)
Calcium: 7.5 mg/dL — ABNORMAL LOW (ref 8.9–10.3)
Chloride: 106 mmol/L (ref 98–111)
Creatinine, Ser: 0.95 mg/dL (ref 0.61–1.24)
GFR calc Af Amer: >60 mL/min (ref >60)
Glucose, Bld: 126 mg/dL — ABNORMAL HIGH (ref 70–99)
Potassium: 3.6 mmol/L (ref 3.5–5.1)
Sodium: 138 mmol/L (ref 135–145)
TOTAL PROTEIN: 5 g/dL — AB (ref 6.5–8.1)
Total Bilirubin: 0.3 mg/dL (ref 0.3–1.2)

## 2018-11-15 LAB — PHOSPHORUS: Phosphorus: 3.7 mg/dL (ref 2.5–4.6)

## 2018-11-15 LAB — PREPARE RBC (CROSSMATCH)

## 2018-11-15 LAB — CBC WITH DIFFERENTIAL/PLATELET
Abs Immature Granulocytes: 0.05 10*3/uL (ref 0.00–0.07)
Basophils Absolute: 0 10*3/uL (ref 0.0–0.1)
Basophils Relative: 0 %
EOS ABS: 0.1 10*3/uL (ref 0.0–0.5)
Eosinophils Relative: 1 %
HCT: 21.4 % — ABNORMAL LOW (ref 39.0–52.0)
Hemoglobin: 6.2 g/dL — ABNORMAL LOW (ref 13.0–17.0)
Immature Granulocytes: 1 %
Lymphocytes Relative: 8 %
Lymphs Abs: 0.8 10*3/uL (ref 0.7–4.0)
MCH: 22.8 pg — ABNORMAL LOW (ref 26.0–34.0)
MCHC: 29 g/dL — ABNORMAL LOW (ref 30.0–36.0)
MCV: 78.7 fL — ABNORMAL LOW (ref 80.0–100.0)
Monocytes Absolute: 0.7 10*3/uL (ref 0.1–1.0)
Monocytes Relative: 8 %
Neutro Abs: 8.2 10*3/uL — ABNORMAL HIGH (ref 1.7–7.7)
Neutrophils Relative %: 82 %
Platelets: 368 10*3/uL (ref 150–400)
RBC: 2.72 MIL/uL — AB (ref 4.22–5.81)
RDW: 18.2 % — ABNORMAL HIGH (ref 11.5–15.5)
WBC: 9.9 10*3/uL (ref 4.0–10.5)
nRBC: 0 % (ref 0.0–0.2)

## 2018-11-15 LAB — SAMPLE TO BLOOD BANK

## 2018-11-15 LAB — MAGNESIUM: Magnesium: 1.9 mg/dL (ref 1.7–2.4)

## 2018-11-15 NOTE — Therapy (Signed)
[place] MAIN [facility] SERVICES [address] [place], [place], [address] Phone: [phone]   Fax:  [phone]  Occupational Therapy Treatment  Patient Details  Name: Louis Davies MRN: [id number] Date of Birth: 26-Oct-1950 No data recorded  Encounter Date: 11/15/2018  OT End of Session - 11/15/18 1711    Visit Number  32    Number of Visits  67    Date for OT Re-Evaluation  01/02/19    Authorization Type  visit 4 for 2020    Authorization Time Period  cont OT 2-3 x weekly x 12 weeks/ 36 visits to address LUE/LUQ lymphedema 2/35m metastatic melanoma    Authorization - Visit Number  4    Authorization - Number of Visits  36    OT Start Time  0915    OT Stop Time  1010    OT Time Calculation (min)  55 min    Activity Tolerance  Patient tolerated treatment well;Patient limited by pain;No increased pain    Behavior During Therapy  WFL for tasks assessed/performed       Past Medical History:  Diagnosis Date  . Anemia   . Arthritis   . Cancer ([place])    MELANOMA  . Complication of anesthesia   . Dyspnea    doe  . Perforated viscus   . PONV (postoperative nausea and vomiting)     Past Surgical History:  Procedure Laterality Date  . AXILLARY LYMPH NODE DISSECTION Left 06/26/2017   Procedure: EXCISION OF LEFT AXILLARY MASS;  Surgeon: Johnathan Hausen, MD;  Location: [facility];  Service: General;  Laterality: Left;  . AXILLARY LYMPH NODE DISSECTION Left 11/01/2018   Procedure: AXILLARY DEBRIDEMENT;  Surgeon: Vickie Epley, MD;  Location: [facility];  Service: General;  Laterality: Left;  . DIAGNOSTIC LAPAROSCOPY    . EMBOLIZATION Left 05/11/2018   Procedure: EMBOLIZATION;  Surgeon: Katha Cabal, MD;  Location: [facility];  Service: Cardiovascular;  Laterality: Left;  . LAPAROTOMY N/A 10/14/2017   Procedure: EXPLORATORY LAPAROTOMY;  Surgeon: Clayburn Pert, MD;  Location: [facility];  Service: General;  Laterality: N/A;  .  VASECTOMY    . WOUND DEBRIDEMENT Left 09/24/2018   Procedure: LEFT AXILLARY EXCISIONAL DEBRIDEMENT OF NECROTIC TISSUE;  Surgeon: Vickie Epley, MD;  Location: [facility];  Service: General;  Laterality: Left;    There were no vitals filed for this visit.  Subjective Assessment - 11/15/18 1714    Subjective   [name] presents for OT treatment visit 32/36 ( visit 4 for 2020) to address LUE/ LUQ lymphedema 2/2 stage 4 melanoma. Pt's significant other drove him to clinic tody. Pt repports he had a pretty good weekend visiting with his youngest daughter and her family, including 83 month old grqandson. Pt has no new complints. Pt repoorts  axillary mass continues to increase in size since recent debridement sx.    Limitations   LUE/ LUQ pain, swelling and odor from wound contribute to impaired basic and instrumental ADLs, limited participation in leisure pursuits and  work/ productive activities, impaired social participation, impaired role performance, impaired body image     Currently in Pain?  Yes   L arm and axilla- pain not rated numerically today   Pain Location  Axilla    Pain Orientation  Left    Pain Descriptors / Indicators  Aching;Guarding;Nagging;Tender;Sore;Constant;Heaviness;Tightness;Penetrating;Tiring    Pain Type  Chronic pain    Pain Onset  Other (comment)   post surgical  Pain Frequency  Constant    Aggravating Factors   L shoulder AROM against gravity w and w/o resistance- all planes; lifting, carrying, sidelying, all bed mobility, bending                   OT Treatments/Exercises (OP) - 11/15/18 0001      ADLs   ADL Education Given  Yes      Manual Therapy   Manual Therapy  Edema management;Compression Bandaging;Manual Lymphatic Drainage (MLD)    Edema Management  Pt able to dress and undress himself without assistance today.     Manual Lymphatic Drainage (MLD)  MLD to LUE/LUQ seated in a chair with BUE supported omn plintgh at comfortable end shoulder  flexion range. Utilized short neck sequnce bilaterally. Utilized adjacent anterio and posterior anastomoses as well as ipsilateral axillary -inguinal pathways to offload edema towards functional pathways. Pt encouraged throughout session to perform diaphragmatic breathing to stimulate deep lymph movement and for relaxation. 30 minutes session devoted to proximal to distal L arm and hand stokes.     Compression Bandaging  Pt donned sleeve independently after session. Pt required assistance to don upper body clothing.             OT Education - 11/15/18 1719    Education Details  Continued skilled Pt/caregiver education  And LE ADL training throughout visit for lymphedema self care/ home program, including compression wrapping, compression garment and device wear/care, lymphatic pumping ther ex, simple self-MLD, and skin care. Discussed progress towards goals.     Person(s) Educated  Patient    Methods  Explanation;Demonstration;Handout;Tactile cues;Verbal cues    Comprehension  Verbalized understanding;Need further instruction;Returned demonstration;Verbal cues required;Tactile cues required          OT Long Term Goals - 11/10/18 1049      OT LONG TERM GOAL #1   Title  Caregiver will be able to provide max A to apply compression wraps using light, gradient compression technique to control swelling when / if donning sleeve is no longer tolerated.     Baseline  Dependent    Time  6    Period  Days    Status  New    Target Date  12/10/18      OT LONG TERM GOAL #2   Title  Pt will demonstrate ability to perform modified simple self MLD to terminus at neck and clavicles, and LUE for optimal independence with this component of LE self-care.     Baseline  mod A    Time  3    Period  Days    Status  New    Target Date  12/01/18      OT LONG TERM GOAL #3   Title  Pt to achieve no less than 10% limb volume reduction in affected limb(s) during Intensive Phase CDT to control limb  swelling, to improve tissue integrity and immune function, to improve ADLs performance and to improve functional mobility/ transfers.    Baseline  dependent    Time  12    Period  Weeks    Status  Deferred      OT LONG TERM GOAL #4   Title  Pt will achieve 100% compliance with daily LE self-care home program components, including proper skin care, simple self-MLD, gradient compression wraps/ garments, and therapeutic exercise to ensure optimal Intensive Phase limb volume reduction to expedite compression garment/ device fitting.    Baseline  Max A    Time  12    Period  Weeks    Status  Deferred      OT LONG TERM GOAL #5   Title  Pt will demonstrate competent use of assistive devices during LE self-care training to achieve pain free dressing, bathing and grooming .    Baseline  Max A    Time  12    Period  Weeks    Status  Deferred      OT LONG TERM GOAL #6   Title  Pt will retain optimal limb volume reductions achieved during Intensive Phase CDT with no more than 15% volume increase  without CG assistance to limit LE progression and further functional decline.    Baseline  Dependent    Time  3    Period  Months    Status  Revised            Plan - 11/15/18 1720    Clinical Impression Statement  Modified CDT 2-3 x weekly continues to provide palliative symptom relief and control for LUE/LUQ swelling and associated pain. Despite decreased LUE AROM Pt demonstrates WNL fine motor skills and functional LUE use for basic and instrumental ADLs with modified independence ( extra time or assistive device/ environmental modifications). Pt is diligent with daytime compression garment as directed. He reports intermittant performance of simple self MLD when he is able. Cnt as per  POC to optimize functional independence and symptom relief at highest possile level withing limits of progressive metastatic disease.    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    OT Frequency  3x / week    OT Duration  Other (comment)   ongoing as palliative measure 2/2 advanced cancer and worsening LE w/ functional decline   OT Treatment/Interventions  Self-care/ADL training;Therapeutic exercise;Manual lymph drainage;Compression bandaging;Patient/family education;Other (comment);Energy conservation;Therapeutic activities;Balance training;DME and/or AE instruction;Manual Therapy;Passive range of motion;Coping strategies training   Assist Pt with modification of TShirts with velcro cosures at shoulder and side seams to reduce pain     when dressing. Provide additional assistive device resources PRN.   Clinical Decision Making  Multiple treatment options, significant modification of task necessary    Recommended Other Services  fit with custom, clat knit LUE compression sleeve and HOS device. Consider seamless compression glove and vest. Consider kinesiotape during Intensive Phase    Consulted and Agree with Plan of Care  Patient       Patient will benefit from skilled therapeutic intervention in order to improve the following deficits and impairments:  Decreased endurance, Decreased mobility, Decreased skin integrity, Impaired sensation, Decreased knowledge of precautions, Decreased range of motion, Decreased scar mobility, Increased edema, Pain, Decreased activity tolerance, Decreased knowledge of use of DME, Decreased strength, Impaired flexibility, Impaired UE functional use, Decreased psychosocial skills, Decreased  balance, Other (comment)(balance and gait negatively impacted by guarding  posture , impaired armswing and body asymmetry)  Visit Diagnosis: Lymphedema, not elsewhere classified    Problem List Patient Active Problem List   Diagnosis Date Noted  . Cancer associated pain 10/26/2018  . On antineoplastic chemotherapy 10/26/2018  . Localized  tissue death ([place])   . Malignant melanoma of left upper extremity including shoulder ([place])   . Open wound of left axillary region with complication 19/62/2297  . Anemia 05/10/2018  . Ileus ([place])   . Constipation 10/10/2017  . Metastatic melanoma ([place]) 07/07/2017  . Axillary mass, left 06/26/2017  . Chronic pain of left knee 12/14/2015  . Influenza vaccination declined 11/27/2015   Andrey Spearman, MS, OTR/L, [facility] 11/15/18 5:27 PM    [place] MAIN [facility] SERVICES [address] [place], [place], [address] Phone: [phone]   Fax:  [phone]  Name: BAER HINTON MRN: [id number] Date of Birth: 05/04/1951

## 2018-11-15 NOTE — Progress Notes (Signed)
Patient is here today to follow up on his metastatic melanoma and anemia. Patient stated that he had been doing well. Patient stated that he had been feeling well. He stated that he had been eating more calories. Patient had gained 5 lbs since 11/09/2018.

## 2018-11-16 ENCOUNTER — Encounter: Payer: Medicare Other | Admitting: Occupational Therapy

## 2018-11-16 ENCOUNTER — Ambulatory Visit: Payer: Medicare Other

## 2018-11-16 ENCOUNTER — Emergency Department
Admission: EM | Admit: 2018-11-16 | Discharge: 2018-11-16 | Disposition: A | Discharge status: Home / Self Care | Payer: Medicare Other | Attending: Emergency Medicine | Admitting: Emergency Medicine

## 2018-11-16 ENCOUNTER — Inpatient Hospital Stay: Payer: Medicare Other

## 2018-11-16 ENCOUNTER — Other Ambulatory Visit: Payer: Self-pay

## 2018-11-16 ENCOUNTER — Telehealth: Payer: Self-pay

## 2018-11-16 DIAGNOSIS — C439 Malignant melanoma of skin, unspecified: Secondary | ICD-10-CM

## 2018-11-16 DIAGNOSIS — Y998 Other external cause status: Secondary | ICD-10-CM | POA: Insufficient documentation

## 2018-11-16 DIAGNOSIS — C4362 Malignant melanoma of left upper limb, including shoulder: Secondary | ICD-10-CM | POA: Diagnosis not present

## 2018-11-16 DIAGNOSIS — S41112A Laceration without foreign body of left upper arm, initial encounter: Secondary | ICD-10-CM

## 2018-11-16 DIAGNOSIS — Z79899 Other long term (current) drug therapy: Secondary | ICD-10-CM | POA: Insufficient documentation

## 2018-11-16 DIAGNOSIS — X58XXXA Exposure to other specified factors, initial encounter: Secondary | ICD-10-CM | POA: Diagnosis not present

## 2018-11-16 DIAGNOSIS — C799 Secondary malignant neoplasm of unspecified site: Secondary | ICD-10-CM

## 2018-11-16 DIAGNOSIS — Y9389 Activity, other specified: Secondary | ICD-10-CM | POA: Insufficient documentation

## 2018-11-16 DIAGNOSIS — Y929 Unspecified place or not applicable: Secondary | ICD-10-CM | POA: Insufficient documentation

## 2018-11-16 DIAGNOSIS — Z862 Personal history of diseases of the blood and blood-forming organs and certain disorders involving the immune mechanism: Secondary | ICD-10-CM | POA: Insufficient documentation

## 2018-11-16 DIAGNOSIS — C4359 Malignant melanoma of other part of trunk: Secondary | ICD-10-CM | POA: Diagnosis not present

## 2018-11-16 DIAGNOSIS — S4992XA Unspecified injury of left shoulder and upper arm, initial encounter: Secondary | ICD-10-CM | POA: Diagnosis present

## 2018-11-16 LAB — CBC WITH DIFFERENTIAL/PLATELET
Abs Immature Granulocytes: 0.05 10*3/uL (ref 0.00–0.07)
Basophils Absolute: 0 10*3/uL (ref 0.0–0.1)
Basophils Relative: 0 %
Eosinophils Absolute: 0.2 10*3/uL (ref 0.0–0.5)
Eosinophils Relative: 2 %
HCT: 29.3 % — ABNORMAL LOW (ref 39.0–52.0)
Hemoglobin: 9.2 g/dL — ABNORMAL LOW (ref 13.0–17.0)
Immature Granulocytes: 1 %
Lymphocytes Relative: 8 %
Lymphs Abs: 0.8 10*3/uL (ref 0.7–4.0)
MCH: 25.1 pg — ABNORMAL LOW (ref 26.0–34.0)
MCHC: 31.4 g/dL (ref 30.0–36.0)
MCV: 80.1 fL (ref 80.0–100.0)
Monocytes Absolute: 0.9 10*3/uL (ref 0.1–1.0)
Monocytes Relative: 9 %
NEUTROS ABS: 8.1 10*3/uL — AB (ref 1.7–7.7)
Neutrophils Relative %: 80 %
Platelets: 334 10*3/uL (ref 150–400)
RBC: 3.66 MIL/uL — ABNORMAL LOW (ref 4.22–5.81)
RDW: 17.8 % — ABNORMAL HIGH (ref 11.5–15.5)
WBC: 10 10*3/uL (ref 4.0–10.5)
nRBC: 0 % (ref 0.0–0.2)

## 2018-11-16 LAB — COMPREHENSIVE METABOLIC PANEL
ALT: 11 U/L (ref 0–44)
AST: 18 U/L (ref 15–41)
Albumin: 2.7 g/dL — ABNORMAL LOW (ref 3.5–5.0)
Alkaline Phosphatase: 40 U/L (ref 38–126)
Anion gap: 5 (ref 5–15)
BUN: 16 mg/dL (ref 8–23)
CO2: 27 mmol/L (ref 22–32)
CREATININE: 0.7 mg/dL (ref 0.61–1.24)
Calcium: 7.8 mg/dL — ABNORMAL LOW (ref 8.9–10.3)
Chloride: 107 mmol/L (ref 98–111)
GFR calc Af Amer: >60 mL/min (ref >60)
GFR calc non Af Amer: >60 mL/min (ref >60)
Glucose, Bld: 115 mg/dL — ABNORMAL HIGH (ref 70–99)
Potassium: 3.9 mmol/L (ref 3.5–5.1)
Sodium: 139 mmol/L (ref 135–145)
Total Bilirubin: 0.7 mg/dL (ref 0.3–1.2)
Total Protein: 5.6 g/dL — ABNORMAL LOW (ref 6.5–8.1)

## 2018-11-16 LAB — THYROID PANEL WITH TSH
FREE THYROXINE INDEX: 1.4 (ref 1.2–4.9)
T3 Uptake Ratio: 30 % (ref 24–39)
T4, Total: 4.6 ug/dL (ref 4.5–12.0)
TSH: 3.42 u[IU]/mL (ref 0.450–4.500)

## 2018-11-16 LAB — APTT: aPTT: 32 seconds (ref 24–36)

## 2018-11-16 LAB — PROTIME-INR
INR: 1.08
PROTHROMBIN TIME: 13.9 s (ref 11.4–15.2)

## 2018-11-16 MED ORDER — DIPHENHYDRAMINE HCL 50 MG/ML IJ SOLN
25.0000 mg | Freq: Once | INTRAMUSCULAR | Status: AC
  Administered 2018-11-16: 25 mg via INTRAVENOUS
  Filled 2018-11-16: qty 1

## 2018-11-16 MED ORDER — ACETAMINOPHEN 325 MG PO TABS
650.0000 mg | ORAL_TABLET | Freq: Once | ORAL | Status: DC
  Filled 2018-11-16: qty 2

## 2018-11-16 MED ORDER — OXYCODONE HCL 5 MG PO TABS
10.0000 mg | ORAL_TABLET | Freq: Once | ORAL | Status: AC
  Administered 2018-11-16: 10 mg via ORAL
  Filled 2018-11-16: qty 2

## 2018-11-16 MED ORDER — SODIUM CHLORIDE 0.9% IV SOLUTION
250.0000 mL | Freq: Once | INTRAVENOUS | Status: AC
  Administered 2018-11-16: 250 mL via INTRAVENOUS
  Filled 2018-11-16: qty 250

## 2018-11-16 NOTE — ED Notes (Signed)
Pt temp is 100.9. Pt states his temp raises when he takes his oxycodone.

## 2018-11-16 NOTE — ED Notes (Signed)
Pt to the er for bleeding to the left axillae area. Pt has a tumor in the left axillae that was debrided today. Pt was at home and his caregiver was applying a new dressing and the wound began to bleed and is now hanging on to his body loosely. Caregiver packed the wound and applied a pressure dressing with abd pads. Bleeding is now controlled however the tumor is still hanging loosely. MD at bedside. Wound has a foul odor. Pt was given a blood transfusion today at the cancer center.

## 2018-11-16 NOTE — ED Provider Notes (Signed)
[facility] Emergency Department Provider Note  ____________________________________________   First MD Initiated Contact with Patient 11/16/18 1528     (approximate)  I have reviewed the triage vital signs and the nursing notes.   HISTORY  Chief Complaint Wound Infection   HPI Louis Davies is a 68 y.o. male with a history of melanoma with a large mass in his left axilla who is presented to the emergency department with bleeding from the left axillary mass.  He says that he had a debulking surgery in December with Dr. Rosana Hoes.  Says that he was also given a transfusion just this morning of 2 units of blood.  Thereafter, the patient says that part of the mass developed a tear in it which started bleeding.  He is here with his significant other who says that she packed the wound as well as wrapped with an Ace wrap and there is been no further bleeding.  Unclear exactly how much blood was lost.  However, the patient as well as his significant other stated there was no peddling on the floor or any clots that develop.   Past Medical History:  Diagnosis Date  . Anemia   . Arthritis   . Cancer ([place])    MELANOMA  . Complication of anesthesia   . Dyspnea    doe  . Perforated viscus   . PONV (postoperative nausea and vomiting)     Patient Active Problem List   Diagnosis Date Noted  . Cancer associated pain 10/29/2018  . On antineoplastic chemotherapy 10-29-2018  . Localized tissue death ([place])   . 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 Open wound of left axillary region with complication 38/18/2993  . Anemia 05/10/2018  . Ileus ([place])   . Constipation 10/10/2017  . Metastatic melanoma ([place]) 07/07/2017  . Axillary mass, left 06/26/2017  . Chronic pain of left knee 12/14/2015  . Influenza vaccination declined 11/27/2015    Past Surgical History:  Procedure Laterality Date  . AXILLARY LYMPH NODE DISSECTION Left 06/26/2017   Procedure: EXCISION OF LEFT AXILLARY MASS;  Surgeon: Johnathan Hausen, MD;  Location: [facility];  Service: General;  Laterality: Left;  . AXILLARY LYMPH NODE DISSECTION Left 11/01/2018   Procedure: AXILLARY DEBRIDEMENT;  Surgeon: Vickie Epley, MD;  Location: [facility];  Service: General;  Laterality: Left;  . DIAGNOSTIC LAPAROSCOPY    . EMBOLIZATION Left 05/11/2018   Procedure: EMBOLIZATION;  Surgeon: Katha Cabal, MD;  Location: [place] [facility];  Service: Cardiovascular;  Laterality: Left;  . LAPAROTOMY N/A 10/14/2017   Procedure: EXPLORATORY LAPAROTOMY;  Surgeon: Clayburn Pert, MD;  Location: [facility];  Service: General;  Laterality: N/A;  . VASECTOMY    . WOUND DEBRIDEMENT Left 09/24/2018   Procedure: LEFT AXILLARY EXCISIONAL DEBRIDEMENT OF NECROTIC TISSUE;  Surgeon: Vickie Epley, MD;  Location: [facility];  Service: General;  Laterality: Left;    Prior to Admission medications   Medication Sig Start Date End Date Taking? Authorizing Provider  acetaminophen (TYLENOL) 325 MG tablet  10/19/18   [provider]  Binimetinib (MEKTOVI) 15 MG TABS Take 45 mg by mouth 2 (two) times daily. 11/01/18   Lloyd Huger, MD  Encorafenib 75 MG CAPS Take 450 mg by mouth daily. 11/01/18   Lloyd Huger, MD  ibuprofen (ADVIL,MOTRIN) 200 MG tablet Take 200 mg by mouth every 6 (six) hours as needed.    [provider]  metroNIDAZOLE (FLAGYL) 500  MG tablet CRUSH 1 TABLET AND SPRINKLE ON YOUR WOUND BID 10/19/18   [provider]  Multiple Vitamin (MULTIVITAMIN) tablet Take 1 tablet by mouth daily.    [provider]  Oxycodone HCl 10 MG TABS Take 1 tablet (10 mg total) by mouth every 6 (six) hours as needed. 10/26/18   Lloyd Huger, MD  prednisoLONE acetate (PRED FORTE) 1 % ophthalmic suspension Place 1 drop into both eyes 4 (four) times daily. 11/08/18   Verlon Au, NP    Allergies Patient has no known allergies.  Family History    Problem Relation Age of Onset  . Diabetes Mother   . Hypertension Mother   . Leukemia Father   . Thyroid disease Sister   . Liver disease Maternal Grandmother   . Heart Problems Maternal Grandfather   . Diabetes Maternal Grandfather   . Lung cancer Paternal Grandmother   . Prostate cancer Paternal Grandfather     Social History Social History   Tobacco Use  . Smoking status: Never Smoker  . Smokeless tobacco: Never Used  Substance Use Topics  . Alcohol use: No  . Drug use: No    Review of Systems  Constitutional: No fever/chills Eyes: No visual changes. ENT: No sore throat. Cardiovascular: Denies chest pain. Respiratory: Denies shortness of breath. Gastrointestinal: No abdominal pain.  No nausea, no vomiting.  No diarrhea.  No constipation. Genitourinary: Negative for dysuria. Musculoskeletal: Negative for back pain. Skin: As above Neurological: Negative for headaches, focal weakness or numbness.   ____________________________________________   PHYSICAL EXAM:  VITAL SIGNS: ED Triage Vitals [11/16/18 1525]  Enc Vitals Group     BP 122/66     Pulse Rate 81     Resp 17     Temp 98.1 F (36.7 C)     Temp Source Oral     SpO2 100 %     Weight 164 lb 9.6 oz (74.7 kg)     Height 5\' 9"  (1.753 m)     Head Circumference      Peak Flow      Pain Score 6     Pain Loc      Pain Edu?      Excl. in [place]?     Constitutional: Alert and oriented. Well appearing and in no acute distress. Eyes: Conjunctivae are normal.  Head: Atraumatic. Nose: No congestion/rhinnorhea. Mouth/Throat: Mucous membranes are moist.  Neck: No stridor.   Cardiovascular: Normal rate, regular rhythm. Grossly normal heart sounds.  Respiratory: Normal respiratory effort.  No retractions. Lungs CTAB. Gastrointestinal: Soft and nontender. No distention.  Musculoskeletal: No lower extremity tenderness nor edema.  No joint effusions. Neurologic:  Normal speech and language. No gross focal  neurologic deficits are appreciated. Skin:    Large, foul-smelling necrotic mass that encompasses almost the entire surface area of the left axilla.  In the superior aspect which extends to the proximal left arm there is a tissue mass with a laceration in it that is filled with clot but without any active bleeding.  Distal to this mass there is edema to the left upper extremity which is moderate which the patient says is unchanged.  Patient and significant other also report that the foul odor is a chronic odor and is unchanged.  Psychiatric: Mood and affect are normal. Speech and behavior are normal.  ____________________________________________   LABS (all labs ordered are listed, but only abnormal results are displayed)  Labs Reviewed  CBC WITH DIFFERENTIAL/PLATELET - Abnormal; Notable  for the following components:      Result Value   RBC 3.66 (*)    Hemoglobin 9.2 (*)    HCT 29.3 (*)    MCH 25.1 (*)    RDW 17.8 (*)    Neutro Abs 8.1 (*)    All other components within normal limits  COMPREHENSIVE METABOLIC PANEL - Abnormal; Notable for the following components:   Glucose, Bld 115 (*)    Calcium 7.8 (*)    Total Protein 5.6 (*)    Albumin 2.7 (*)    All other components within normal limits  PROTIME-INR  APTT   ____________________________________________  EKG   ____________________________________________  RADIOLOGY   ____________________________________________   PROCEDURES  Procedure(s) performed:   Procedures  Critical Care performed:   ____________________________________________   INITIAL IMPRESSION / ASSESSMENT AND PLAN / ED COURSE  Pertinent labs & imaging results that were available during my care of the patient were reviewed by me and considered in my medical decision making (see chart for details).  DDX: Laceration, malignant melanoma, hemorrhage, anemia As part of my medical decision making, I reviewed the following data within the electronic  MEDICAL RECORD NUMBER Notes from prior outpatient visits  ----------------------------------------- 5:43 PM on 11/16/2018 -----------------------------------------  Patient evaluated by Dr. Dahlia Byes who placed an Ace wrap over the laceration but does not recommend any intervention at this time.  Patient with reassuring hemoglobin.  No further bleeding.  He will follow-up with oncology as well as surgery as an outpatient.  Will be discharged at this time. ____________________________________________   FINAL CLINICAL IMPRESSION(S) / ED DIAGNOSES  Laceration care.  Melanoma.  NEW MEDICATIONS STARTED DURING THIS VISIT:  New Prescriptions   No medications on file     Note:  This document was prepared using Dragon voice recognition software and may include unintentional dictation errors.     Orbie Pyo, MD 11/16/18 [phone]

## 2018-11-16 NOTE — Telephone Encounter (Signed)
Patient's friend , Joaquim Lai, called and says that the patient was seen in oncology today and had a blood transfusion. She states that when they got home today he started bleeding from his surgical excision site and that his tumor was "falling out". She had tried wrapping it up but he was continuing to bleed. I instructed her that she would need to take the patient to the ER to see about controlling the bleeding and what may be done about his tumor.

## 2018-11-16 NOTE — Progress Notes (Signed)
Patient seen and examined.  He does have a necrotic metastatic axillary mass.  He has required debridement and multiple transfusion.  I have seen and examined the area it is necrotic but is not actively bleeding.  I did place Surgicel compression dressing and a figure-of-eight Cob[place] wrap. Is a very difficult situation given the metastatic nature and the progression of his disease.  There is really no good solution.  Radical excision is contraindicated and multiple debridements may lead to further bleeding.  I discussed this in detail with the patient and he understands.  No need for any surgical intervention at this time

## 2018-11-16 NOTE — ED Triage Notes (Addendum)
Pt is currently getting treatment for multiple melanoma . Pt has a large wound under the left axillary with noted foul odor. States he just had the area debreading 1/13 and was at the [facility] center today for a blood transfusion. States he takes oral chemo. States today while changing the dressing part of the tumor fell apart and is now have a lot of bleeding from the site. Pt has bandages in place with a ace wrap for compression, EDP is aware.

## 2018-11-16 NOTE — ED Notes (Signed)
Pt given pillow per request. Caregiver states pt is due for his pain medication. Will look up his dosage and request order from MD.

## 2018-11-16 NOTE — ED Notes (Signed)
Blood sent to the lab 

## 2018-11-16 NOTE — ED Notes (Signed)
In with surgeon to assist assessment and dressing change. Blood drawn and sent to lab.

## 2018-11-16 NOTE — ED Notes (Signed)
Family at bedside. 

## 2018-11-17 ENCOUNTER — Ambulatory Visit: Payer: Medicare Other | Admitting: Occupational Therapy

## 2018-11-17 DIAGNOSIS — I890 Lymphedema, not elsewhere classified: Secondary | ICD-10-CM | POA: Diagnosis not present

## 2018-11-17 LAB — TYPE AND SCREEN
ABO/RH(D): B NEG
ANTIBODY SCREEN: NEGATIVE
Unit division: 0
Unit division: 0

## 2018-11-17 LAB — BPAM RBC
Blood Product Expiration Date: 202002122359
Blood Product Expiration Date: 202002252359
ISSUE DATE / TIME: 202001280946
ISSUE DATE / TIME: 202001281201
UNIT TYPE AND RH: 1700
UNIT TYPE AND RH: 1700

## 2018-11-18 ENCOUNTER — Encounter: Payer: Medicare Other | Admitting: Occupational Therapy

## 2018-11-18 ENCOUNTER — Ambulatory Visit: Payer: Medicare Other | Admitting: Occupational Therapy

## 2018-11-18 DIAGNOSIS — I890 Lymphedema, not elsewhere classified: Secondary | ICD-10-CM

## 2018-11-18 NOTE — Therapy (Signed)
[place] MAIN [facility] SERVICES [address] [place], [place], [address] Phone: [phone]   Fax:  [phone]  Occupational Therapy Treatment  Patient Details  Name: Louis Davies MRN: [id number] Date of Birth: 1951/04/10 No data recorded  Encounter Date: 11/17/2018  OT End of Session - 11/17/18 1645    Visit Number  33    Number of Visits  53    Date for OT Re-Evaluation  01/02/19    Authorization Type  visit 5 for 2020    Authorization Time Period  cont OT 2-3 x weekly x 12 weeks/ 36 visits to address LUE/LUQ lymphedema 2/61m metastatic melanoma    Authorization - Visit Number  4    Authorization - Number of Visits  36    OT Start Time  1008    OT Stop Time  1112    OT Time Calculation (min)  64 min    Activity Tolerance  Patient tolerated treatment well;Patient limited by pain;No increased pain    Behavior During Therapy  WFL for tasks assessed/performed       Past Medical History:  Diagnosis Date  . Anemia   . Arthritis   . Cancer ([place])    MELANOMA  . Complication of anesthesia   . Dyspnea    doe  . Perforated viscus   . PONV (postoperative nausea and vomiting)     Past Surgical History:  Procedure Laterality Date  . AXILLARY LYMPH NODE DISSECTION Left 06/26/2017   Procedure: EXCISION OF LEFT AXILLARY MASS;  Surgeon: Johnathan Hausen, MD;  Location: [facility];  Service: General;  Laterality: Left;  . AXILLARY LYMPH NODE DISSECTION Left 11/01/2018   Procedure: AXILLARY DEBRIDEMENT;  Surgeon: Vickie Epley, MD;  Location: [facility];  Service: General;  Laterality: Left;  . DIAGNOSTIC LAPAROSCOPY    . EMBOLIZATION Left 05/11/2018   Procedure: EMBOLIZATION;  Surgeon: Katha Cabal, MD;  Location: [facility];  Service: Cardiovascular;  Laterality: Left;  . LAPAROTOMY N/A 10/14/2017   Procedure: EXPLORATORY LAPAROTOMY;  Surgeon: Clayburn Pert, MD;  Location: [facility];  Service: General;  Laterality: N/A;  .  VASECTOMY    . WOUND DEBRIDEMENT Left 09/24/2018   Procedure: LEFT AXILLARY EXCISIONAL DEBRIDEMENT OF NECROTIC TISSUE;  Surgeon: Vickie Epley, MD;  Location: [facility];  Service: General;  Laterality: Left;    There were no vitals filed for this visit.  Subjective Assessment - 11/17/18 1640    Subjective   Louis Davies presents for OT treatment visit 33/36 ( visit 5 for 2020) to address LUE/ LUQ lymphedema 2/2 stage 4 melanoma. Pt reports he feels a bit better today after transfusion.    Limitations   LUE/ LUQ pain, swelling and odor from wound contribute to impaired basic and instrumental ADLs, limited participation in leisure pursuits and  work/ productive activities, impaired social participation, impaired role performance, impaired body image     Currently in Pain?  Yes    Pain Score  --   not rated numerically   Pain Location  Axilla    Pain Orientation  Left    Pain Descriptors / Indicators  Aching;Guarding;Sore;Discomfort;Heaviness;Tightness;Tiring    Pain Type  Chronic pain    Pain Onset  Other (comment)   post surgical   Pain Frequency  Constant                   OT Treatments/Exercises (OP) - 11/18/18 0001      ADLs  ADL Education Given  Yes      Manual Therapy   Manual Therapy  Edema management;Compression Bandaging;Manual Lymphatic Drainage (MLD)    Edema Management  Pt able to dress and undress himself without assistance today.     Manual Lymphatic Drainage (MLD)  MLD to LUE/LUQ seated in a chair with BUE supported omn plintgh at comfortable end shoulder flexion range. Utilized short neck sequnce bilaterally. Utilized adjacent anterio and posterior anastomoses as well as ipsilateral axillary -inguinal pathways to offload edema towards functional pathways. Pt encouraged throughout session to perform diaphragmatic breathing to stimulate deep lymph movement and for relaxation. 30 minutes session devoted to proximal to distal L arm and hand stokes.      Compression Bandaging  Pt donned sleeve independently after session. Pt required assistance to don upper body clothing.             OT Education - 11/17/18 1644    Education Details  No Pt edu today. Pt slept through most of session    Person(s) Educated  Patient    Comprehension  Need further instruction          OT Long Term Goals - 11/10/18 1049      OT LONG TERM GOAL #1   Title  Caregiver will be able to provide max A to apply compression wraps using light, gradient compression technique to control swelling when / if donning sleeve is no longer tolerated.     Baseline  Dependent    Time  6    Period  Days    Status  New    Target Date  12/10/18      OT LONG TERM GOAL #2   Title  Pt will demonstrate ability to perform modified simple self MLD to terminus at neck and clavicles, and LUE for optimal independence with this component of LE self-care.     Baseline  mod A    Time  3    Period  Days    Status  New    Target Date  12/01/18      OT LONG TERM GOAL #3   Title  Pt to achieve no less than 10% limb volume reduction in affected limb(s) during Intensive Phase CDT to control limb swelling, to improve tissue integrity and immune function, to improve ADLs performance and to improve functional mobility/ transfers.    Baseline  dependent    Time  12    Period  Weeks    Status  Deferred      OT LONG TERM GOAL #4   Title  Pt will achieve 100% compliance with daily LE self-care home program components, including proper skin care, simple self-MLD, gradient compression wraps/ garments, and therapeutic exercise to ensure optimal Intensive Phase limb volume reduction to expedite compression garment/ device fitting.    Baseline  Max A    Time  12    Period  Weeks    Status  Deferred      OT LONG TERM GOAL #5   Title  Pt will demonstrate competent use of assistive devices during LE self-care training to achieve pain free dressing, bathing and grooming .    Baseline  Max A     Time  12    Period  Weeks    Status  Deferred      OT LONG TERM GOAL #6   Title  Pt will retain optimal limb volume reductions achieved during Intensive Phase CDT with no more than 15%  volume increase  without CG assistance to limit LE progression and further functional decline.    Baseline  Dependent    Time  3    Period  Months    Status  Revised            Plan - 11/18/18 1645    Clinical Impression Statement  Pt slept through most of session. L arm swelling is visibly increased today as is hand. Pt continues to don armsleeve independently, but he rarely gets sleeve pulled as far proximally as it needs to be. Will continue to mionitor closely and if swelling continues and progresses will resume short stretch wraps with aim of preserving skin integrity and limiting infection risk. Additionally, will apply wraps     in configuration that limits function as little as possible. Pt continues to report analgesic effect from MLD. Cont as per POC.    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    OT Frequency  3x / week    OT Duration  Other (comment)   ongoing as palliative measure 2/2 advanced cancer and worsening LE w/ functional decline   OT Treatment/Interventions  Self-care/ADL training;Therapeutic exercise;Manual lymph drainage;Compression bandaging;Patient/family education;Other (comment);Energy conservation;Therapeutic activities;Balance training;DME and/or AE instruction;Manual Therapy;Passive  range of motion;Coping strategies training   Assist Pt with modification of TShirts with velcro cosures at shoulder and side seams to reduce pain     when dressing. Provide additional assistive device resources PRN.   Clinical Decision Making  Multiple treatment options, significant modification of task necessary    Recommended Other Services  fit with custom, clat knit LUE compression sleeve and HOS device. Consider seamless compression glove and vest. Consider kinesiotape during Intensive Phase    Consulted and Agree with Plan of Care  Patient       Patient will benefit from skilled therapeutic intervention in order to improve the following deficits and impairments:  Decreased endurance, Decreased mobility, Decreased skin integrity, Impaired sensation, Decreased knowledge of precautions, Decreased range of motion, Decreased scar mobility, Increased edema, Pain, Decreased activity tolerance, Decreased knowledge of use of DME, Decreased strength, Impaired flexibility, Impaired UE functional use, Decreased psychosocial skills, Decreased balance, Other (comment)(balance and gait negatively impacted by guarding  posture , impaired armswing and body asymmetry)  Visit Diagnosis: Lymphedema, not elsewhere classified    Problem List Patient Active Problem List   Diagnosis Date Noted  . 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 On antineoplastic chemotherapy October 27, 2018  . Localized tissue death ([place])   . 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 Open wound of left axillary region with complication 77/82/4235  . Anemia 05/10/2018  . Ileus ([place])   . Constipation 10/10/2017  . Metastatic melanoma ([place]) 07/07/2017  . Axillary mass, left 06/26/2017  . Chronic pain of left knee 12/14/2015  . Influenza vaccination declined 11/27/2015    Andrey Spearman, MS, OTR/L, [facility] 11/18/18 4:50 PM  [place] MAIN [facility] SERVICES [address] [place], [place], [address] Phone: [phone]   Fax:  [phone]  Name: Louis Majette  Davies MRN: [id number] Date of Birth: 02-Mar-1951

## 2018-11-18 NOTE — Therapy (Signed)
[place] MAIN [facility] SERVICES [address] [place], [place], [address] Phone: [phone]   Fax:  [phone]  Occupational Therapy Treatment  Patient Details  Name: Louis Davies MRN: [id number] Date of Birth: 06-Dec-1950 No data recorded  Encounter Date: 11/18/2018  OT End of Session - 11/18/18 1843    Visit Number  34    Number of Visits  66    Date for OT Re-Evaluation  01/02/19    Authorization Type  visit 6 for 2020    Authorization Time Period  cont OT 2-3 x weekly x 12 weeks/ 36 visits to address LUE/LUQ lymphedema 2/20m metastatic melanoma    Authorization - Visit Number  4    Authorization - Number of Visits  36    OT Start Time  1110    OT Stop Time  1220    OT Time Calculation (min)  70 min    Activity Tolerance  Patient tolerated treatment well;Patient limited by pain;No increased pain    Behavior During Therapy  WFL for tasks assessed/performed       Past Medical History:  Diagnosis Date  . Anemia   . Arthritis   . Cancer ([place])    MELANOMA  . Complication of anesthesia   . Dyspnea    doe  . Perforated viscus   . PONV (postoperative nausea and vomiting)     Past Surgical History:  Procedure Laterality Date  . AXILLARY LYMPH NODE DISSECTION Left 06/26/2017   Procedure: EXCISION OF LEFT AXILLARY MASS;  Surgeon: Johnathan Hausen, MD;  Location: [facility];  Service: General;  Laterality: Left;  . AXILLARY LYMPH NODE DISSECTION Left 11/01/2018   Procedure: AXILLARY DEBRIDEMENT;  Surgeon: Vickie Epley, MD;  Location: [facility];  Service: General;  Laterality: Left;  . DIAGNOSTIC LAPAROSCOPY    . EMBOLIZATION Left 05/11/2018   Procedure: EMBOLIZATION;  Surgeon: Katha Cabal, MD;  Location: [facility];  Service: Cardiovascular;  Laterality: Left;  . LAPAROTOMY N/A 10/14/2017   Procedure: EXPLORATORY LAPAROTOMY;  Surgeon: Clayburn Pert, MD;  Location: [facility];  Service: General;  Laterality: N/A;  .  VASECTOMY    . WOUND DEBRIDEMENT Left 09/24/2018   Procedure: LEFT AXILLARY EXCISIONAL DEBRIDEMENT OF NECROTIC TISSUE;  Surgeon: Vickie Epley, MD;  Location: [facility];  Service: General;  Laterality: Left;    There were no vitals filed for this visit.                             OT Long Term Goals - 11/10/18 1049      OT LONG TERM GOAL #1   Title  Caregiver will be able to provide max A to apply compression wraps using light, gradient compression technique to control swelling when / if donning sleeve is no longer tolerated.     Baseline  Dependent    Time  6    Period  Days    Status  New    Target Date  12/10/18      OT LONG TERM GOAL #2   Title  Pt will demonstrate ability to perform modified simple self MLD to terminus at neck and clavicles, and LUE for optimal independence with this component of LE self-care.     Baseline  mod A    Time  3    Period  Days    Status  New    Target Date  12/01/18  OT LONG TERM GOAL #3   Title  Pt to achieve no less than 10% limb volume reduction in affected limb(s) during Intensive Phase CDT to control limb swelling, to improve tissue integrity and immune function, to improve ADLs performance and to improve functional mobility/ transfers.    Baseline  dependent    Time  12    Period  Weeks    Status  Deferred      OT LONG TERM GOAL #4   Title  Pt will achieve 100% compliance with daily LE self-care home program components, including proper skin care, simple self-MLD, gradient compression wraps/ garments, and therapeutic exercise to ensure optimal Intensive Phase limb volume reduction to expedite compression garment/ device fitting.    Baseline  Max A    Time  12    Period  Weeks    Status  Deferred      OT LONG TERM GOAL #5   Title  Pt will demonstrate competent use of assistive devices during LE self-care training to achieve pain free dressing, bathing and grooming .    Baseline  Max A    Time  12     Period  Weeks    Status  Deferred      OT LONG TERM GOAL #6   Title  Pt will retain optimal limb volume reductions achieved during Intensive Phase CDT with no more than 15% volume increase  without CG assistance to limit LE progression and further functional decline.    Baseline  Dependent    Time  3    Period  Months    Status  Revised              Patient will benefit from skilled therapeutic intervention in order to improve the following deficits and impairments:     Visit Diagnosis: Lymphedema, not elsewhere classified    Problem List Patient Active Problem List   Diagnosis Date Noted  . Cancer associated pain 11-16-2018  . On antineoplastic chemotherapy 2018/11/16  . Localized tissue death ([place])   . 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 Open wound of left axillary region with complication 28/20/6015  . Anemia 05/10/2018  . Ileus ([place])   . Constipation 10/10/2017  . Metastatic melanoma ([place]) 07/07/2017  . Axillary mass, left 06/26/2017  . Chronic pain of left knee 12/14/2015  . Influenza vaccination declined 11/27/2015    Ansel Bong 11/18/2018, 6:45 PM  [place] MAIN [facility] SERVICES [address] [place], [place], [address] Phone: [phone]   Fax:  [phone]  Name: BERISH BOHMAN MRN: [id number] Date of Birth: 13-Nov-1950

## 2018-11-23 ENCOUNTER — Ambulatory Visit: Payer: Medicare Other | Attending: Oncology | Admitting: Occupational Therapy

## 2018-11-23 ENCOUNTER — Other Ambulatory Visit: Payer: Self-pay | Admitting: *Deleted

## 2018-11-23 DIAGNOSIS — I890 Lymphedema, not elsewhere classified: Secondary | ICD-10-CM | POA: Diagnosis not present

## 2018-11-23 MED ORDER — OXYCODONE HCL 10 MG PO TABS: 10.0000 mg | ORAL_TABLET | Freq: Four times a day (QID) | ORAL | 0 refills | Status: DC | PRN

## 2018-11-23 NOTE — Therapy (Signed)
[place] MAIN [facility] SERVICES [address] [place], [place], [address] Phone: [phone]   Fax:  [phone]  Occupational Therapy Treatment  Patient Details  Name: Louis Davies MRN: [id number] Date of Birth: 06/14/1951 No data recorded  Encounter Date: 11/23/2018  OT End of Session - 11/23/18 1641    Visit Number  35    Number of Visits  3    Date for OT Re-Evaluation  01/02/19    Authorization Type  visit 7 for 2020    Authorization Time Period  cont OT 2-3 x weekly x 12 weeks/ 36 visits to address LUE/LUQ lymphedema 2/7m metastatic melanoma    Authorization - Visit Number  4    Authorization - Number of Visits  36    Activity Tolerance  Patient tolerated treatment well;Patient limited by pain;No increased pain    Behavior During Therapy  WFL for tasks assessed/performed       Past Medical History:  Diagnosis Date  . Anemia   . Arthritis   . Cancer ([place])    MELANOMA  . Complication of anesthesia   . Dyspnea    doe  . Perforated viscus   . PONV (postoperative nausea and vomiting)     Past Surgical History:  Procedure Laterality Date  . AXILLARY LYMPH NODE DISSECTION Left 06/26/2017   Procedure: EXCISION OF LEFT AXILLARY MASS;  Surgeon: Johnathan Hausen, MD;  Location: [facility];  Service: General;  Laterality: Left;  . AXILLARY LYMPH NODE DISSECTION Left 11/01/2018   Procedure: AXILLARY DEBRIDEMENT;  Surgeon: Vickie Epley, MD;  Location: [facility];  Service: General;  Laterality: Left;  . DIAGNOSTIC LAPAROSCOPY    . EMBOLIZATION Left 05/11/2018   Procedure: EMBOLIZATION;  Surgeon: Katha Cabal, MD;  Location: [facility];  Service: Cardiovascular;  Laterality: Left;  . LAPAROTOMY N/A 10/14/2017   Procedure: EXPLORATORY LAPAROTOMY;  Surgeon: Clayburn Pert, MD;  Location: [facility];  Service: General;  Laterality: N/A;  . VASECTOMY    . WOUND DEBRIDEMENT Left 09/24/2018   Procedure: LEFT AXILLARY EXCISIONAL  DEBRIDEMENT OF NECROTIC TISSUE;  Surgeon: Vickie Epley, MD;  Location: [facility];  Service: General;  Laterality: Left;    There were no vitals filed for this visit.  Subjective Assessment - 11/23/18 1639    Subjective   Mr. Pflug presents for OT treatment visit 35/36 ( visit 6 for 2020) to address LUE/ LUQ lymphedema 2/2 stage 4 melanoma.  Pt states he is feeling pretty good again today.    Limitations   LUE/ LUQ pain, swelling and odor from wound contribute to impaired basic and instrumental ADLs, limited participation in leisure pursuits and  work/ productive activities, impaired social participation, impaired role performance, impaired body image     Currently in Pain?  Yes    Pain Score  --   not rated numerically   Pain Location  Axilla    Pain Orientation  Left    Pain Descriptors / Indicators  Guarding;Tender;Sore;Pressure;Discomfort;Constant;Heaviness;Tightness;Squeezing;Penetrating;Tiring   tumor pain, arm pain   Pain Type  Chronic pain    Pain Onset  Other (comment)   post surgical   Pain Frequency  Constant                   OT Treatments/Exercises (OP) - 11/23/18 0001      ADLs   ADL Education Given  Yes      Manual Therapy   Manual Therapy  Edema management;Compression Bandaging;Manual  Lymphatic Drainage (MLD)    Edema Management  Min A to don button down shirt    Manual Lymphatic Drainage (MLD)  MLD to LUE/LUQ seated in a chair with BUE supported omn plintgh at comfortable end shoulder flexion range. Utilized short neck sequnce bilaterally. Utilized adjacent anterio and posterior anastomoses as well as ipsilateral axillary -inguinal pathways to offload edema towards functional pathways. Pt encouraged throughout session to perform diaphragmatic breathing to stimulate deep lymph movement and for relaxation. 30 minutes session devoted to proximal to distal L arm and hand stokes.     Compression Bandaging  Pt donned sleeve independently after session. Pt  required assistance to don upper body clothing.             OT Education - 11/23/18 1641    Education Details  Continued skilled Pt/caregiver education  And LE ADL training throughout visit for lymphedema self care/ home program, including compression wrapping, compression garment and device wear/care, lymphatic pumping ther ex, simple self-MLD, and skin care. Discussed progress towards goals.     Person(s) Educated  Patient    Methods  Explanation;Demonstration;Handout;Tactile cues;Verbal cues    Comprehension  Verbalized understanding;Need further instruction;Returned demonstration;Verbal cues required;Tactile cues required          OT Long Term Goals - 11/10/18 1049      OT LONG TERM GOAL #1   Title  Caregiver will be able to provide max A to apply compression wraps using light, gradient compression technique to control swelling when / if donning sleeve is no longer tolerated.     Baseline  Dependent    Time  6    Period  Days    Status  New    Target Date  12/10/18      OT LONG TERM GOAL #2   Title  Pt will demonstrate ability to perform modified simple self MLD to terminus at neck and clavicles, and LUE for optimal independence with this component of LE self-care.     Baseline  mod A    Time  3    Period  Days    Status  New    Target Date  12/01/18      OT LONG TERM GOAL #3   Title  Pt to achieve no less than 10% limb volume reduction in affected limb(s) during Intensive Phase CDT to control limb swelling, to improve tissue integrity and immune function, to improve ADLs performance and to improve functional mobility/ transfers.    Baseline  dependent    Time  12    Period  Weeks    Status  Deferred      OT LONG TERM GOAL #4   Title  Pt will achieve 100% compliance with daily LE self-care home program components, including proper skin care, simple self-MLD, gradient compression wraps/ garments, and therapeutic exercise to ensure optimal Intensive Phase limb volume  reduction to expedite compression garment/ device fitting.    Baseline  Max A    Time  12    Period  Weeks    Status  Deferred      OT LONG TERM GOAL #5   Title  Pt will demonstrate competent use of assistive devices during LE self-care training to achieve pain free dressing, bathing and grooming .    Baseline  Max A    Time  12    Period  Weeks    Status  Deferred      OT LONG TERM GOAL #6  Title  Pt will retain optimal limb volume reductions achieved during Intensive Phase CDT with no more than 15% volume increase  without CG assistance to limit LE progression and further functional decline.    Baseline  Dependent    Time  3    Period  Months    Status  Revised            Plan - 11/23/18 1642    Clinical Impression Statement  LUE swelling markedly decreased today com-ared with last visit. PT is performing simple self MLD in limited way when pain in sL shoulder and arm increases during HOS. Pt tolerated manual therapy without difficulty today. ASeated position for AAA, PAA and AI anastomoses strokes remains much more comfortable for Pt than supine. Supine positioning best tolerated for arm techniques. Cont as per POC. Consider compression wrapping PRN.    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    OT Frequency  3x / week    OT Duration  Other (comment)   ongoing as palliative measure 2/2 advanced cancer and worsening LE w/ functional decline   OT Treatment/Interventions  Self-care/ADL  training;Therapeutic exercise;Manual lymph drainage;Compression bandaging;Patient/family education;Other (comment);Energy conservation;Therapeutic activities;Balance training;DME and/or AE instruction;Manual Therapy;Passive range of motion;Coping strategies training   Assist Pt with modification of TShirts with velcro cosures at shoulder and side seams to reduce pain     when dressing. Provide additional assistive device resources PRN.   Clinical Decision Making  Multiple treatment options, significant modification of task necessary    Recommended Other Services  fit with custom, clat knit LUE compression sleeve and HOS device. Consider seamless compression glove and vest. Consider kinesiotape during Intensive Phase    Consulted and Agree with Plan of Care  Patient       Patient will benefit from skilled therapeutic intervention in order to improve the following deficits and impairments:  Decreased endurance, Decreased mobility, Decreased skin integrity, Impaired sensation, Decreased knowledge of precautions, Decreased range of motion, Decreased scar mobility, Increased edema, Pain, Decreased activity tolerance, Decreased knowledge of use of DME, Decreased strength, Impaired flexibility, Impaired UE functional use, Decreased psychosocial skills, Decreased balance, Other (comment)(balance and gait negatively impacted by guarding  posture , impaired armswing and body asymmetry)  Visit Diagnosis: Lymphedema, not elsewhere classified    Problem List Patient Active Problem List   Diagnosis Date Noted  . 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 On antineoplastic chemotherapy November 05, 2018  . Localized tissue death ([place])   . 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 Open wound of left axillary region with complication 99/37/1696  . Anemia 05/10/2018  . Ileus ([place])   . Constipation 10/10/2017  . Metastatic melanoma ([place]) 07/07/2017  . Axillary mass, left 06/26/2017  .  Chronic pain of left knee 12/14/2015  . Influenza vaccination declined 11/27/2015    Andrey Spearman, MS, OTR/L, [facility] 11/23/18 4:46 PM  [place] MAIN [facility] SERVICES [address] [place], [place], [address] Phone: [phone]   Fax:  [phone]  Name: Louis Davies MRN: [id number] Date of  Birth: 1950-11-15

## 2018-11-25 ENCOUNTER — Ambulatory Visit: Payer: Medicare Other | Admitting: Hospice and Palliative Medicine

## 2018-11-25 ENCOUNTER — Ambulatory Visit: Payer: Medicare Other | Admitting: Occupational Therapy

## 2018-11-25 DIAGNOSIS — I890 Lymphedema, not elsewhere classified: Secondary | ICD-10-CM

## 2018-11-25 NOTE — Therapy (Signed)
[place] MAIN [facility] SERVICES [address] [place], [place], [address] Phone: [phone]   Fax:  [phone]  Occupational Therapy Treatment  Patient Details  Name: Louis Davies MRN: [id number] Date of Birth: 08-13-1951 No data recorded  Encounter Date: 11/25/2018  OT End of Session - 11/25/18 1707    Visit Number  8    Number of Visits  6    Date for OT Re-Evaluation  01/02/19    Authorization Type  visit 8/57for 2020;  36th visits total    Authorization Time Period  cont OT 2-3 x weekly x 12 weeks/ 36 visits to address LUE/LUQ lymphedema 2/54m metastatic melanoma    Authorization - Visit Number  --    Authorization - Number of Visits  61    OT Start Time  1115    OT Stop Time  1215    OT Time Calculation (min)  60 min    Activity Tolerance  Patient tolerated treatment well;Patient limited by pain    Behavior During Therapy  [facility] for tasks assessed/performed       Past Medical History:  Diagnosis Date  . Anemia   . Arthritis   . Cancer ([place])    MELANOMA  . Complication of anesthesia   . Dyspnea    doe  . Perforated viscus   . PONV (postoperative nausea and vomiting)     Past Surgical History:  Procedure Laterality Date  . AXILLARY LYMPH NODE DISSECTION Left 06/26/2017   Procedure: EXCISION OF LEFT AXILLARY MASS;  Surgeon: Johnathan Hausen, MD;  Location: [facility];  Service: General;  Laterality: Left;  . AXILLARY LYMPH NODE DISSECTION Left 11/01/2018   Procedure: AXILLARY DEBRIDEMENT;  Surgeon: Vickie Epley, MD;  Location: [facility];  Service: General;  Laterality: Left;  . DIAGNOSTIC LAPAROSCOPY    . EMBOLIZATION Left 05/11/2018   Procedure: EMBOLIZATION;  Surgeon: Katha Cabal, MD;  Location: [place];  Service: Cardiovascular;  Laterality: Left;  . LAPAROTOMY N/A 10/14/2017   Procedure: EXPLORATORY LAPAROTOMY;  Surgeon: Clayburn Pert, MD;  Location: [facility];  Service: General;  Laterality: N/A;  .  VASECTOMY    . WOUND DEBRIDEMENT Left 09/24/2018   Procedure: LEFT AXILLARY EXCISIONAL DEBRIDEMENT OF NECROTIC TISSUE;  Surgeon: Vickie Epley, MD;  Location: [facility];  Service: General;  Laterality: Left;    There were no vitals filed for this visit.  Subjective Assessment - 11/25/18 1701    Subjective   Louis Davies presents for OT treatment visit 70 total) visit 7/ 36  for 2020 to address LUE/ LUQ lymphedema 2/2 stage 4 melanoma. Pt reporting increased pain in arm and L axilla today. Pt reports he didn't have assistance with changing bandage on wound befre session. Pt's clothes are soiled with drainage and wound is more malodorous than usual;. Pt presents with xcompression armsleve in place.    Limitations   LUE/ LUQ pain, swelling and odor from wound contribute to impaired basic and instrumental ADLs, limited participation in leisure pursuits and  work/ productive activities, impaired social participation, impaired role performance, impaired body image     Currently in Pain?  Yes    Pain Score  --   not rated numerically in clinic. Pt reports awaking  last night w/ 8/10 pain in L shoulder and axcilla.   Pain Location  Axilla    Pain Orientation  Left    Pain Descriptors / Indicators  Aching;Guarding;Tender;Pressure;Sore;Tightness;Heaviness;Discomfort;Constant;Penetrating;Grimacing;Tiring    Pain Type  Chronic pain    Pain Onset  Other (comment)   post surgical                  OT Treatments/Exercises (OP) - 11/25/18 0001      ADLs   ADL Comments  No ADL edu for LE self care today due to pain      Manual Therapy   Manual Therapy  Edema management    Edema Management  Min A to don button down shirt    Compression Bandaging  Pt donned sleeve independently after session. Pt required assistance to don upper body clothing.             OT Education - 11/25/18 1706    Education Details  No Pt edu today 2/2 increased pain     Person(s) Educated  Patient           OT Long Term Goals - 11/10/18 1049      OT LONG TERM GOAL #1   Title  Caregiver will be able to provide max A to apply compression wraps using light, gradient compression technique to control swelling when / if donning sleeve is no longer tolerated.     Baseline  Dependent    Time  6    Period  Days    Status  New    Target Date  12/10/18      OT LONG TERM GOAL #2   Title  Pt will demonstrate ability to perform modified simple self MLD to terminus at neck and clavicles, and LUE for optimal independence with this component of LE self-care.     Baseline  mod A    Time  3    Period  Days    Status  New    Target Date  12/01/18      OT LONG TERM GOAL #3   Title  Pt to achieve no less than 10% limb volume reduction in affected limb(s) during Intensive Phase CDT to control limb swelling, to improve tissue integrity and immune function, to improve ADLs performance and to improve functional mobility/ transfers.    Baseline  dependent    Time  12    Period  Weeks    Status  Deferred      OT LONG TERM GOAL #4   Title  Pt will achieve 100% compliance with daily LE self-care home program components, including proper skin care, simple self-MLD, gradient compression wraps/ garments, and therapeutic exercise to ensure optimal Intensive Phase limb volume reduction to expedite compression garment/ device fitting.    Baseline  Max A    Time  12    Period  Weeks    Status  Deferred      OT LONG TERM GOAL #5   Title  Pt will demonstrate competent use of assistive devices during LE self-care training to achieve pain free dressing, bathing and grooming .    Baseline  Max A    Time  12    Period  Weeks    Status  Deferred      OT LONG TERM GOAL #6   Title  Pt will retain optimal limb volume reductions achieved during Intensive Phase CDT with no more than 15% volume increase  without CG assistance to limit LE progression and further functional decline.    Baseline  Dependent    Time  3     Period  Months    Status  Revised  Plan - 11/25/18 1710    Clinical Impression Statement  LUE swelling well controlled again today. Pt with increased pain when he arrived and hroughout session. Provided MLD primarily in supine today in effort to  reduce pain and promote relaxation . Manual therapy provided mild anagesic effect per Pt report. No need to wrap today. No ulcers or open areas observed on limb. Axillary tumor with significant increase in malodor today. Cont as per OC at current frequecy as tolerated to limit infection risk, control  swelling, reduce pain, and  facilititate optimal functional L shoulder and elbow AROM.    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    OT Frequency  3x / week    OT Duration  Other (comment)   ongoing as palliative measure 2/2 advanced cancer and worsening LE w/ functional decline   OT Treatment/Interventions  Self-care/ADL training;Therapeutic exercise;Manual lymph drainage;Compression bandaging;Patient/family education;Other (comment);Energy conservation;Therapeutic activities;Balance training;DME and/or AE instruction;Manual Therapy;Passive range of motion;Coping strategies training   Assist Pt with modification of TShirts with velcro cosures at shoulder and side seams to reduce pain     when dressing. Provide additional assistive device resources PRN.   Clinical Decision Making  Multiple treatment options, significant modification of task necessary     Recommended Other Services  fit with custom, clat knit LUE compression sleeve and HOS device. Consider seamless compression glove and vest. Consider kinesiotape during Intensive Phase    Consulted and Agree with Plan of Care  Patient       Patient will benefit from skilled therapeutic intervention in order to improve the following deficits and impairments:  Decreased endurance, Decreased mobility, Decreased skin integrity, Impaired sensation, Decreased knowledge of precautions, Decreased range of motion, Decreased scar mobility, Increased edema, Pain, Decreased activity tolerance, Decreased knowledge of use of DME, Decreased strength, Impaired flexibility, Impaired UE functional use, Decreased psychosocial skills, Decreased balance, Other (comment)(balance and gait negatively impacted by guarding  posture , impaired armswing and body asymmetry)  Visit Diagnosis: Lymphedema, not elsewhere classified    Problem List Patient Active Problem List   Diagnosis Date Noted  . Cancer associated pain 09-Nov-2018  . On antineoplastic chemotherapy Nov 09, 2018  . Localized tissue death ([place])   . 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 Open wound of left axillary region with complication 41/66/0630  . Anemia 05/10/2018  . Ileus ([place])   . Constipation 10/10/2017  . Metastatic melanoma ([place]) 07/07/2017  . Axillary mass, left 06/26/2017  . Chronic pain of left knee 12/14/2015  . Influenza vaccination declined 11/27/2015    Andrey Spearman, MS, OTR/L, [facility] 11/25/18 5:14 PM  [place] MAIN [facility] SERVICES [address] [place], [place], [address] Phone: [phone]   Fax:  [phone]  Name: Louis Davies MRN: [id number] Date of Birth: 1950-11-19

## 2018-11-26 NOTE — Progress Notes (Signed)
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  INTERVAL HISTORY: Patient returns to clinic today for further evaluation, consideration of blood transfusion, and assess his toleration of dose reduced encorafenib/binimetinib.  He continues to have significant weakness and fatigue as well as a decreased performance status, but otherwise feels well.  He continues to have left axillary pain and states the lesion has grown in the past week.  He otherwise feels well.  He has no neurologic complaints. He denies any recent fevers or illnesses. He has no chest pain, cough, or hemoptysis. He denies any nausea, vomiting, constipation, or diarrhea. He has no melena or hematochezia. He has no urinary complaints.  Patient offers no further specific complaints today.  REVIEW OF SYSTEMS:   Review of Systems  Constitutional: Positive for malaise/fatigue. Negative for fever and weight loss.  Respiratory: Negative.  Negative for cough and shortness of breath.   Cardiovascular: Negative.  Negative for chest pain and leg swelling.  Gastrointestinal: Negative.  Negative for abdominal pain, blood in stool, melena, nausea and vomiting.  Genitourinary: Negative.  Negative for dysuria.  Musculoskeletal: Negative.  Negative for myalgias.  Skin: Negative.  Negative for rash.  Neurological: Positive for weakness. Negative for sensory change and focal weakness.  Psychiatric/Behavioral: Negative.  The patient is not nervous/anxious.     As per HPI. Otherwise, a complete review of systems is negative.  PAST MEDICAL HISTORY: Past Medical History:  Diagnosis Date  . Anemia   . Arthritis   . Cancer ([place])    MELANOMA  . Complication of anesthesia   . Dyspnea    doe    . Perforated viscus   . PONV (postoperative nausea and vomiting)     PAST SURGICAL HISTORY: Past Surgical History:  Procedure Laterality Date  . AXILLARY LYMPH NODE DISSECTION Left 06/26/2017   Procedure: EXCISION OF LEFT AXILLARY MASS;  Surgeon: Johnathan Hausen, MD;  Location: [facility];  Service: General;  Laterality: Left;  . AXILLARY LYMPH NODE DISSECTION Left 11/01/2018   Procedure: AXILLARY DEBRIDEMENT;  Surgeon: Vickie Epley, MD;  Location: [facility] ORS;  Service: General;  Laterality: Left;  . DIAGNOSTIC LAPAROSCOPY    . EMBOLIZATION Left 05/11/2018   Procedure: EMBOLIZATION;  Surgeon: Katha Cabal, MD;  Location: [place] [facility];  Service: Cardiovascular;  Laterality: Left;  . LAPAROTOMY N/A 10/14/2017   Procedure: EXPLORATORY LAPAROTOMY;  Surgeon: Clayburn Pert, MD;  Location: [facility];  Service: General;  Laterality: N/A;  . VASECTOMY    . WOUND DEBRIDEMENT Left 09/24/2018   Procedure: LEFT AXILLARY EXCISIONAL DEBRIDEMENT OF NECROTIC TISSUE;  Surgeon: Vickie Epley, MD;  Location: [facility];  Service: General;  Laterality: Left;    FAMILY HISTORY: Family History  Problem Relation Age of Onset  . Diabetes Mother   . Hypertension Mother   . Leukemia Father   . Thyroid disease Sister   . Liver disease Maternal Grandmother   . Heart Problems Maternal Grandfather   . Diabetes Maternal Grandfather   . Lung cancer Paternal Grandmother   . Prostate cancer Paternal Grandfather     ADVANCED DIRECTIVES (Y/N):  N  HEALTH MAINTENANCE: Social History   Tobacco Use  . Smoking status: Never Smoker  . Smokeless tobacco: Never Used  Substance Use Topics  . Alcohol  use: No  . Drug use: No     Colonoscopy:  PAP:  Bone density:  Lipid panel:  No Known Allergies  Current Outpatient Medications  Medication Sig Dispense Refill  . acetaminophen (TYLENOL) 325 MG tablet     . Binimetinib (MEKTOVI) 15 MG TABS Take 45 mg by mouth 2 (two) times daily. 180 tablet 2   . Encorafenib 75 MG CAPS Take 450 mg by mouth daily. 180 capsule 2  . ibuprofen (ADVIL,MOTRIN) 200 MG tablet Take 200 mg by mouth every 6 (six) hours as needed.    . metroNIDAZOLE (FLAGYL) 500 MG tablet CRUSH 1 TABLET AND SPRINKLE ON YOUR WOUND BID    . Multiple Vitamin (MULTIVITAMIN) tablet Take 1 tablet by mouth daily.    . Oxycodone HCl 10 MG TABS Take 1 tablet (10 mg total) by mouth every 6 (six) hours as needed. 60 tablet 0  . prednisoLONE acetate (PRED FORTE) 1 % ophthalmic suspension Place 1 drop into both eyes 4 (four) times daily. 5 mL 0   No current facility-administered medications for this visit.     OBJECTIVE: Vitals:   11/29/18 0928  BP: (!) 97/54  Pulse: 72  Resp: 18  Temp: (!) 96.6 F (35.9 C)     Body mass index is 23.69 kg/m.    ECOG FS:2 - Symptomatic, <50% confined to bed  General: Ill-appearing, thin, no acute distress. Eyes: Pink conjunctiva, anicteric sclera. HEENT: Normocephalic, moist mucous membranes, clear oropharnyx. Lungs: Clear to auscultation bilaterally. Heart: Regular rate and rhythm. No rubs, murmurs, or gallops. Abdomen: Soft, nontender, nondistended. No organomegaly noted, normoactive bowel sounds. Musculoskeletal: No edema, cyanosis, or clubbing. Neuro: Alert, answering all questions appropriately. Cranial nerves grossly intact. Skin: No rashes or petechiae noted. Psych: Normal affect. Lymphatics: Large left axillary mass.    LAB RESULTS:  Lab Results  Component Value Date   NA 136 11/29/2018   K 3.7 11/29/2018   CL 106 11/29/2018   CO2 26 11/29/2018   GLUCOSE 104 (H) 11/29/2018   BUN 22 11/29/2018   CREATININE 0.89 11/29/2018   CALCIUM 7.5 (L) 11/29/2018   PROT 4.9 (L) 11/29/2018   ALBUMIN 2.2 (L) 11/29/2018   AST 12 (L) 11/29/2018   ALT 9 11/29/2018   ALKPHOS 36 (L) 11/29/2018   BILITOT 0.3 11/29/2018   GFRNONAA >60 11/29/2018   GFRAA >60 11/29/2018    Lab Results  Component Value Date   WBC 10.7 (H) 11/29/2018    NEUTROABS 9.0 (H) 11/29/2018   HGB 7.1 (L) 11/29/2018   HCT 23.8 (L) 11/29/2018   MCV 78.3 (L) 11/29/2018   PLT 402 (H) 11/29/2018     STUDIES: No results found.  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  PLAN:    1. 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  [place] input.  Patient had repeat debridement of his left axillary mass revealing persistent melanoma.  He is tolerating dose reduced encorafenib 300 mg daily plus binimetinib 30 mg daily much better.  Patient will also require baseline cardiac echo in the near future.  Return to clinic in 2 weeks with repeat laboratory work and further evaluation. 2.  Left axilla: Appreciate [facility] and surgical input.  Follow-up with surgery tomorrow as scheduled. 3.  Left arm swelling: Significantly improved.  Continue follow-up with lymphedema clinic. 4.  Poor appetite: Improved.  Continue Megace as prescribed.  Appreciate dietary input. 5.  Anemia: Patient's hemoglobin is 7.1 today.  Proceed with 2 units packed red blood cells.  Patient expressed understanding and was in agreement with this plan. He also understands that He can call clinic at any time with any questions, concerns, or complaints.   Cancer Staging Metastatic melanoma [facility]) Staging form: Melanoma of the Skin, AJCC 8th Edition - Clinical stage from 07/15/2017: Stage IV (cTX, cN1, cM1c(0)) - Signed by Lloyd Huger, MD on 07/15/2017   Lloyd Huger, MD   11/29/2018 6:09 PM

## 2018-11-29 ENCOUNTER — Inpatient Hospital Stay: Payer: Medicare Other

## 2018-11-29 ENCOUNTER — Other Ambulatory Visit: Payer: Self-pay

## 2018-11-29 ENCOUNTER — Inpatient Hospital Stay (HOSPITAL_BASED_OUTPATIENT_CLINIC_OR_DEPARTMENT_OTHER): Payer: Medicare Other | Admitting: Oncology

## 2018-11-29 ENCOUNTER — Inpatient Hospital Stay (HOSPITAL_BASED_OUTPATIENT_CLINIC_OR_DEPARTMENT_OTHER): Payer: Medicare Other | Admitting: Hospice and Palliative Medicine

## 2018-11-29 ENCOUNTER — Inpatient Hospital Stay: Payer: Medicare Other | Attending: Oncology

## 2018-11-29 VITALS — BP 97/54 | HR 72 | Temp 96.6°F | Resp 18 | Wt 160.4 lb

## 2018-11-29 DIAGNOSIS — Z806 Family history of leukemia: Secondary | ICD-10-CM | POA: Insufficient documentation

## 2018-11-29 DIAGNOSIS — D649 Anemia, unspecified: Secondary | ICD-10-CM | POA: Diagnosis not present

## 2018-11-29 DIAGNOSIS — Z801 Family history of malignant neoplasm of trachea, bronchus and lung: Secondary | ICD-10-CM

## 2018-11-29 DIAGNOSIS — Z9221 Personal history of antineoplastic chemotherapy: Secondary | ICD-10-CM

## 2018-11-29 DIAGNOSIS — Z923 Personal history of irradiation: Secondary | ICD-10-CM | POA: Diagnosis not present

## 2018-11-29 DIAGNOSIS — C781 Secondary malignant neoplasm of mediastinum: Secondary | ICD-10-CM | POA: Insufficient documentation

## 2018-11-29 DIAGNOSIS — R5383 Other fatigue: Secondary | ICD-10-CM

## 2018-11-29 DIAGNOSIS — Z66 Do not resuscitate: Secondary | ICD-10-CM | POA: Insufficient documentation

## 2018-11-29 DIAGNOSIS — C799 Secondary malignant neoplasm of unspecified site: Secondary | ICD-10-CM

## 2018-11-29 DIAGNOSIS — M79622 Pain in left upper arm: Secondary | ICD-10-CM

## 2018-11-29 DIAGNOSIS — G893 Neoplasm related pain (acute) (chronic): Secondary | ICD-10-CM | POA: Diagnosis not present

## 2018-11-29 DIAGNOSIS — C439 Malignant melanoma of skin, unspecified: Secondary | ICD-10-CM | POA: Diagnosis not present

## 2018-11-29 DIAGNOSIS — R630 Anorexia: Secondary | ICD-10-CM | POA: Diagnosis not present

## 2018-11-29 DIAGNOSIS — R918 Other nonspecific abnormal finding of lung field: Secondary | ICD-10-CM | POA: Diagnosis not present

## 2018-11-29 DIAGNOSIS — C7951 Secondary malignant neoplasm of bone: Secondary | ICD-10-CM

## 2018-11-29 DIAGNOSIS — Z515 Encounter for palliative care: Secondary | ICD-10-CM | POA: Diagnosis not present

## 2018-11-29 DIAGNOSIS — R531 Weakness: Secondary | ICD-10-CM | POA: Diagnosis not present

## 2018-11-29 DIAGNOSIS — Z79899 Other long term (current) drug therapy: Secondary | ICD-10-CM | POA: Insufficient documentation

## 2018-11-29 DIAGNOSIS — M129 Arthropathy, unspecified: Secondary | ICD-10-CM

## 2018-11-29 DIAGNOSIS — Z800 Family history of malignant neoplasm of digestive organs: Secondary | ICD-10-CM | POA: Diagnosis not present

## 2018-11-29 DIAGNOSIS — I890 Lymphedema, not elsewhere classified: Secondary | ICD-10-CM | POA: Insufficient documentation

## 2018-11-29 LAB — CBC WITH DIFFERENTIAL/PLATELET
Abs Immature Granulocytes: 0.06 10*3/uL (ref 0.00–0.07)
Basophils Absolute: 0 10*3/uL (ref 0.0–0.1)
Basophils Relative: 0 %
Eosinophils Absolute: 0.1 10*3/uL (ref 0.0–0.5)
Eosinophils Relative: 1 %
HCT: 23.8 % — ABNORMAL LOW (ref 39.0–52.0)
Hemoglobin: 7.1 g/dL — ABNORMAL LOW (ref 13.0–17.0)
Immature Granulocytes: 1 %
Lymphocytes Relative: 8 %
Lymphs Abs: 0.8 10*3/uL (ref 0.7–4.0)
MCH: 23.4 pg — AB (ref 26.0–34.0)
MCHC: 29.8 g/dL — ABNORMAL LOW (ref 30.0–36.0)
MCV: 78.3 fL — ABNORMAL LOW (ref 80.0–100.0)
MONO ABS: 0.7 10*3/uL (ref 0.1–1.0)
Monocytes Relative: 7 %
Neutro Abs: 9 10*3/uL — ABNORMAL HIGH (ref 1.7–7.7)
Neutrophils Relative %: 83 %
Platelets: 402 10*3/uL — ABNORMAL HIGH (ref 150–400)
RBC: 3.04 MIL/uL — ABNORMAL LOW (ref 4.22–5.81)
RDW: 18.5 % — ABNORMAL HIGH (ref 11.5–15.5)
WBC: 10.7 10*3/uL — ABNORMAL HIGH (ref 4.0–10.5)
nRBC: 0 % (ref 0.0–0.2)

## 2018-11-29 LAB — COMPREHENSIVE METABOLIC PANEL
ALBUMIN: 2.2 g/dL — AB (ref 3.5–5.0)
ALT: 9 U/L (ref 0–44)
AST: 12 U/L — AB (ref 15–41)
Alkaline Phosphatase: 36 U/L — ABNORMAL LOW (ref 38–126)
Anion gap: 4 — ABNORMAL LOW (ref 5–15)
BUN: 22 mg/dL (ref 8–23)
CO2: 26 mmol/L (ref 22–32)
Calcium: 7.5 mg/dL — ABNORMAL LOW (ref 8.9–10.3)
Chloride: 106 mmol/L (ref 98–111)
Creatinine, Ser: 0.89 mg/dL (ref 0.61–1.24)
GFR calc Af Amer: >60 mL/min (ref >60)
GFR calc non Af Amer: >60 mL/min (ref >60)
Glucose, Bld: 104 mg/dL — ABNORMAL HIGH (ref 70–99)
Potassium: 3.7 mmol/L (ref 3.5–5.1)
Sodium: 136 mmol/L (ref 135–145)
Total Bilirubin: 0.3 mg/dL (ref 0.3–1.2)
Total Protein: 4.9 g/dL — ABNORMAL LOW (ref 6.5–8.1)

## 2018-11-29 LAB — PREPARE RBC (CROSSMATCH)

## 2018-11-29 LAB — PHOSPHORUS: Phosphorus: 3.6 mg/dL (ref 2.5–4.6)

## 2018-11-29 LAB — SAMPLE TO BLOOD BANK

## 2018-11-29 LAB — MAGNESIUM: Magnesium: 2 mg/dL (ref 1.7–2.4)

## 2018-11-29 MED ORDER — ACETAMINOPHEN 325 MG PO TABS
650.0000 mg | ORAL_TABLET | Freq: Once | ORAL | Status: AC
  Administered 2018-11-29: 650 mg via ORAL
  Filled 2018-11-29: qty 2

## 2018-11-29 MED ORDER — DIPHENHYDRAMINE HCL 50 MG/ML IJ SOLN
25.0000 mg | Freq: Once | INTRAMUSCULAR | Status: AC
  Administered 2018-11-29: 25 mg via INTRAVENOUS
  Filled 2018-11-29: qty 1

## 2018-11-29 MED ORDER — SODIUM CHLORIDE 0.9% IV SOLUTION
250.0000 mL | Freq: Once | INTRAVENOUS | Status: AC
  Administered 2018-11-29: 250 mL via INTRAVENOUS
  Filled 2018-11-29: qty 250

## 2018-11-29 NOTE — Progress Notes (Signed)
[place]  [phone] [phone]   Name: Louis Davies Date: 11/29/2018 MRN: [id number]  DOB: July 25, 1951  Patient Care Team: Alvera Singh, FNP as PCP - General (Family Medicine)    REASON FOR CONSULTATION: Palliative Care consult requested for this 68 y.o. 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 He is also s/p XRT and chemotx. Previous PET scan results from September 2019 reveal improved disease.  Patient's care has been complicated by left extremity swelling managed by lymphedema clinic and left axilla wound managed at the wound center.  He also underwent recent surgical debridement of the wound. Palliative care was asked to help address goals of care.  SOCIAL HISTORY:    Patient lives alone.  He has two daughters and a son.  He works as a Games developer.  ADVANCE DIRECTIVES:  Has a living will and daughter is HCPOA.  CODE STATUS: DNR  PAST MEDICAL HISTORY: Past Medical History:  Diagnosis Date  . Anemia   . Arthritis   . Cancer ([place])    MELANOMA  . Complication of anesthesia   . Dyspnea    doe  . Perforated viscus   . PONV (postoperative nausea and vomiting)     PAST SURGICAL HISTORY:  Past Surgical History:  Procedure Laterality Date  . AXILLARY LYMPH NODE DISSECTION Left 06/26/2017   Procedure: EXCISION OF LEFT AXILLARY MASS;  Surgeon: Johnathan Hausen, MD;  Location: [facility];  Service: General;  Laterality: Left;  . AXILLARY LYMPH NODE DISSECTION Left 11/01/2018   Procedure: AXILLARY DEBRIDEMENT;  Surgeon: Vickie Epley, MD;  Location: [facility];  Service: General;  Laterality: Left;  . DIAGNOSTIC LAPAROSCOPY    . EMBOLIZATION Left 05/11/2018   Procedure: EMBOLIZATION;  Surgeon: Katha Cabal, MD;  Location:  [place] [facility];  Service: Cardiovascular;  Laterality: Left;  . LAPAROTOMY N/A 10/14/2017   Procedure: EXPLORATORY LAPAROTOMY;  Surgeon: Clayburn Pert, MD;  Location: [facility];  Service: General;  Laterality: N/A;  . VASECTOMY    . WOUND DEBRIDEMENT Left 09/24/2018   Procedure: LEFT AXILLARY EXCISIONAL DEBRIDEMENT OF NECROTIC TISSUE;  Surgeon: Vickie Epley, MD;  Location: [facility];  Service: General;  Laterality: Left;    HEMATOLOGY/ONCOLOGY HISTORY:  Oncology History   Patient initially  noticed an enlarging nodule in his left axilla approximately in July 2018. Subsequent excisional biopsy revealed metastatic melanoma. Patient had a lesion removed from his left back back in 2016 but otherwise has noted no other problems.  Had Pet Scan in Sept 2019 revealing metastatic disease. BRAF mutation is positive.    MRI of Brain revealed no metastatic disease.   Began with  immunotherapy using ipilumimab and nivolumab every 3 weeks for 4 cycles and then continue with maintenance nivomumab every 2 weeks until progression of disease.  - Ipilimimab & nivolumab complicated by small intestine perforation. Immunotherapy discontinued 10/2017.   Started on 960 mg Zelboraf (10/2017) daily. BRAF mutation is positive.  Dose reduced to 772m BID in March 2019 d/t side effects including nausea, vomiting, weight loss and abdominal bloating.   [place]  Restarted at reduced dose of 480 mg in May 2019.  Had restaging PET scan may 2019 revealed esponse to therapy, as evidenced by decreased size and hypermetabolism  of dominant left axillary mass. Other thoracic nodes were improved and resolved in hypermetabolism as detailed above. 2. Decreased hypermetabolism within indeterminate left iliac lesion  On 04/26/18 patient noted enlarging axillary mass and was concerned. CT chest ordered revelaing a large left axillary mass that had dramatically  increased in size, measuring over 5 times the volume that it did on 03/04/2018. Appearance compatible with rapid growth of malignancy.  - 05/2018 s/p XRT to left axillary mass - 06/2018 PET CT revealing dominant left axillary mass with small ulcerative lesion within the left posterior chest and 2 cm lucent lesion of the left iliac bone -11/29 evaluation for chronic painful left axillary wound and thought to be 2/2 tumor necrosis was treated with Keflex and supportive wound care with no improvement 09/24/2018-debridement and excision of large necrotic mass followed by rapid enlargement of tumor 10/16/2018 presented to [facility] ER for management of left axillary mass and associated pain/infection.  CT chest revealing postsurgical changes in left axilla with heterogeneous density with superimposed infection.  No extension in the thoracic cavity.  Stable small bilateral pulmonary nodules measuring up to 4 mm several of which are calcified suggestive of prior granulomatosis disease -10/25/2017 evaluated by Dr. Trenda Moots - Initiated encorafenib and binimetinib on 11/01/2018     Metastatic melanoma ([place])   07/07/2017 Initial Diagnosis    Metastatic melanoma ([place])     ALLERGIES:  has No Known Allergies.  MEDICATIONS:  Current Outpatient Medications  Medication Sig Dispense Refill  . acetaminophen (TYLENOL) 325 MG tablet     . Binimetinib (MEKTOVI) 15 MG TABS Take 45 mg by mouth 2 (two) times daily. 180 tablet 2  . Encorafenib 75 MG CAPS Take 450 mg by mouth daily. 180 capsule 2  . ibuprofen (ADVIL,MOTRIN) 200 MG tablet Take 200 mg by mouth every 6 (six) hours as needed.    . metroNIDAZOLE (FLAGYL) 500 MG tablet CRUSH 1 TABLET AND SPRINKLE ON YOUR WOUND BID    . Multiple Vitamin (MULTIVITAMIN) tablet Take 1 tablet by mouth daily.    . Oxycodone HCl 10 MG TABS Take 1 tablet (10 mg total) by mouth every 6 (six) hours as needed. 60 tablet 0  . prednisoLONE acetate (PRED FORTE) 1 % ophthalmic suspension Place 1  drop into both eyes 4 (four) times daily. 5 mL 0   No current facility-administered medications for this visit.    Facility-Administered Medications Ordered in Other Visits  Medication Dose Route Frequency Provider Last Rate Last Dose  . 0.9 %  sodium chloride infusion (Manually program via Guardrails IV Fluids)  250 mL Intravenous Once Lloyd Huger, MD      . acetaminophen (TYLENOL) tablet 650 mg  650 mg Oral Once Lloyd Huger, MD      . diphenhydrAMINE (BENADRYL) injection 25 mg  25 mg Intravenous Once Lloyd Huger, MD        VITAL SIGNS: There were no vitals taken for this visit. There were no vitals filed for this visit.  Estimated body mass index is 23.69 kg/m as calculated from the following:   Height as of 11/16/18: '5\' 9"'  (1.753 m).   Weight as of an earlier encounter on 11/29/18: 160 lb 6.4 oz (72.8 kg).  LABS: CBC:    Component Value Date/Time   WBC 10.7 (H) 11/29/2018 0845   HGB 7.1 (L) 11/29/2018 0845   HCT 23.8 (L) 11/29/2018 0845   PLT 402 (H) 11/29/2018 0845   MCV 78.3 (L) 11/29/2018 0845   NEUTROABS  9.0 (H) 11/29/2018 0845   LYMPHSABS 0.8 11/29/2018 0845   MONOABS 0.7 11/29/2018 0845   EOSABS 0.1 11/29/2018 0845   BASOSABS 0.0 11/29/2018 0845   Comprehensive Metabolic Panel:    Component Value Date/Time   NA 136 11/29/2018 0845   K 3.7 11/29/2018 0845   CL 106 11/29/2018 0845   CO2 26 11/29/2018 0845   BUN 22 11/29/2018 0845   CREATININE 0.89 11/29/2018 0845   GLUCOSE 104 (H) 11/29/2018 0845   CALCIUM 7.5 (L) 11/29/2018 0845   AST 12 (L) 11/29/2018 0845   ALT 9 11/29/2018 0845   ALKPHOS 36 (L) 11/29/2018 0845   BILITOT 0.3 11/29/2018 0845   PROT 4.9 (L) 11/29/2018 0845   ALBUMIN 2.2 (L) 11/29/2018 0845    RADIOGRAPHIC STUDIES: No results found.  PERFORMANCE STATUS (ECOG) : 1 - Symptomatic but completely ambulatory  Review of Systems As noted above. Otherwise, a complete review of systems is negative.  Physical  Exam General: NAD, frail appearing, thin Cardiovascular: regular rate and rhythm Pulmonary: Unlabored, CTA ant fields Extremities: no edema, no joint deformities Skin: wound noted but not visualized Neurological: Weakness but otherwise nonfocal  IMPRESSION: Patient was seen today in the clinic for routine follow-up. He is pending blood transfusion for anemia.   Patient says he had increased fatigue and weakness over the weekend but is feeling better today.  Weakness likely is secondary to anemia.  Patient is pending transfusion this morning.  Patient reports that his pain is stable with intermittent use of ibuprofen and/or acetaminophen.  He denies any other distressing symptoms such as dyspnea, constipation, nausea, or vomiting.  Patient says his oral intake has improved some.  Weight is stable.  Patient says he has follow-up appointment later this week with Dr. Rosana Hoes for consideration of future debridement.    PLAN: -Continue current scope of treatment -Acetaminophen PRN for pain  -RTC in 1 month or sooner if needed   Patient expressed understanding and was in agreement with this plan. He also understands that He can call clinic at any time with any questions, concerns, or complaints.    Time Total: 15 minutes  Visit consisted of counseling and education dealing with the complex and emotionally intense issues of symptom management and palliative care in the setting of serious and potentially life-threatening illness.Greater than 50%  of this time was spent counseling and coordinating care related to the above assessment and plan.  Signed by: Altha Harm, PhD, DNP, NP-C, [facility] [phone] (Work Cell)

## 2018-11-30 ENCOUNTER — Other Ambulatory Visit: Payer: Self-pay

## 2018-11-30 ENCOUNTER — Ambulatory Visit: Payer: Medicare Other | Admitting: Occupational Therapy

## 2018-11-30 ENCOUNTER — Ambulatory Visit (INDEPENDENT_AMBULATORY_CARE_PROVIDER_SITE_OTHER): Payer: Medicare Other | Admitting: Surgery

## 2018-11-30 ENCOUNTER — Encounter: Payer: Self-pay | Admitting: Surgery

## 2018-11-30 VITALS — BP 61/60 | HR 79 | Temp 97.3°F | Ht 72.0 in | Wt 163.0 lb

## 2018-11-30 DIAGNOSIS — C439 Malignant melanoma of skin, unspecified: Secondary | ICD-10-CM

## 2018-11-30 DIAGNOSIS — C799 Secondary malignant neoplasm of unspecified site: Secondary | ICD-10-CM

## 2018-11-30 LAB — TYPE AND SCREEN
ABO/RH(D): B NEG
Antibody Screen: NEGATIVE
Unit division: 0
Unit division: 0

## 2018-11-30 LAB — BPAM RBC
BLOOD PRODUCT EXPIRATION DATE: 202002142359
Blood Product Expiration Date: 202003022359
ISSUE DATE / TIME: 202002101148
ISSUE DATE / TIME: 202002101401
Unit Type and Rh: 1700
Unit Type and Rh: 9500

## 2018-11-30 LAB — THYROID PANEL WITH TSH
Free Thyroxine Index: 1.2 (ref 1.2–4.9)
T3 Uptake Ratio: 30 % (ref 24–39)
T4, Total: 4.1 ug/dL — ABNORMAL LOW (ref 4.5–12.0)
TSH: 5.61 u[IU]/mL — ABNORMAL HIGH (ref 0.450–4.500)

## 2018-11-30 NOTE — H&P (View-Only) (Signed)
Surgical Clinic Progress/Follow-up Note   HPI:  68 y.o. very pleasant and unfortunate Male presents to clinic for follow-up evaluation of his chronic Left axillary wound/mass associated with known metastatic malignant melanoma, for which he has continued to require multiple PRBC transfusions (including again yesterday). Patient underwent palliative Left axillary tumor debulking 1 month ago, following which he expressed gratitude for reduced pain/discomfort and far improved LUE range of motion with decreased bleeding, all of which have now since recurred with regrowth of the bleeding Left axillary mass. Patient otherwise denies any fever/chills, N/V, constipation, CP, or SOB. He and his family acknowledge the challenges he faces with limited options, present again today requesting palliative debridement to achieve symptomatic relief similar to that achieved twice previously.  Review of Systems:  Constitutional: denies any other weight loss, fever, chills, or sweats  Eyes: denies any other vision changes, history of eye injury  ENT: denies sore throat, hearing problems  Respiratory: denies shortness of breath, wheezing  Cardiovascular: denies chest pain, palpitations  Gastrointestinal: denies abdominal pain, N/V, or diarrhea Musculoskeletal: denies any other joint pains or cramps  Skin: Denies any other rashes or skin discolorations except as per interval history Neurological: denies any other headache, dizziness, weakness  Psychiatric: denies any other depression, anxiety  All other review of systems: otherwise negative   Vital Signs:  BP (!) 61/60   Pulse 79   Temp (!) 97.3 F (36.3 C) (Skin)   Ht 6' (1.829 m)   Wt 163 lb (73.9 kg)   SpO2 97%   BMI 22.11 kg/m    Physical Exam:  Constitutional:  -- Normal body habitus  -- Awake, alert, and oriented x3  Eyes:  -- Pupils equally round and reactive to light  -- No scleral icterus  Ear, nose, throat:  -- No jugular venous  distension  -- No nasal drainage, bleeding Pulmonary:  -- No crackles -- Equal breath sounds bilaterally -- Breathing non-labored at rest Cardiovascular:  -- S1, S2 present  -- No pericardial rubs  Gastrointestinal:  -- Soft, nontender, non-distended, no guarding/rebound  -- No abdominal masses appreciated, pulsatile or otherwise  Musculoskeletal / Integumentary:  -- Wounds or skin discoloration: large, tender to palpation, friable, and oozing necrotic malodorous Left axillary mass, now with more extensive tunneling peripherally around the circumference of his wound as well as through more anterior-apical skin, previously less involved, still without erythema/purulence -- Extremities: B/L UE and LE FROM, hands and feet warm, +unchanged LUE lymphedema  Neurologic:  -- Motor function: intact and symmetric  -- Sensation: intact and symmetric   Laboratory studies:  CBC Latest Ref Rng & Units 11/29/2018 11/16/2018 11/15/2018  WBC 4.0 - 10.5 K/uL 10.7(H) 10.0 9.9  Hemoglobin 13.0 - 17.0 g/dL 7.1(L) 9.2(L) 6.2(L)  Hematocrit 39.0 - 52.0 % 23.8(L) 29.3(L) 21.4(L)  Platelets 150 - 400 K/uL 402(H) 334 368   CMP Latest Ref Rng & Units 11/29/2018 11/16/2018 11/15/2018  Glucose 70 - 99 mg/dL 104(H) 115(H) 126(H)  BUN 8 - 23 mg/dL 22 16 17   Creatinine 0.61 - 1.24 mg/dL 0.89 0.70 0.95  Sodium 135 - 145 mmol/L 136 139 138  Potassium 3.5 - 5.1 mmol/L 3.7 3.9 3.6  Chloride 98 - 111 mmol/L 106 107 106  CO2 22 - 32 mmol/L 26 27 25   Calcium 8.9 - 10.3 mg/dL 7.5(L) 7.8(L) 7.5(L)  Total Protein 6.5 - 8.1 g/dL 4.9(L) 5.6(L) 5.0(L)  Total Bilirubin 0.3 - 1.2 mg/dL 0.3 0.7 0.3  Alkaline Phos 38 - 126 U/L  36(L) 40 36(L)  AST 15 - 41 U/L 12(L) 18 12(L)  ALT 0 - 44 U/L 9 11 8    Imaging: No new pertinent imaging studies available for review at this time   Assessment:  68 y.o. yo Male with a problem list including...  Patient Active Problem List   Diagnosis Date Noted  . Cancer associated pain 2018/11/24   . On antineoplastic chemotherapy 11-24-18  . 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 Malignant melanoma of left upper extremity including shoulder ([place])   . Open wound of left axillary region with complication 76/80/8811  . Anemia 05/10/2018  . Ileus ([place])   . Constipation 10/10/2017  . Metastatic melanoma ([place]) 07/07/2017  . Axillary mass, left 06/26/2017  . Chronic pain of left knee 12/14/2015  . Influenza vaccination declined 11/27/2015    presents to clinic for follow-up evaluation of his chronic enlarging, painful, bleeding Left axillary wound and mass associated with known metastatic malignant melanoma, for which he has continued to require multiple PRBC transfusions (including again yesterday), despite which he presents today with relative hypotension s/p palliative Left axillary tumor debulking x2.  Plan:   - once again discussed progression of patient's locally advanced unresectable and known metastatic melanoma, otherwise controlled by systemic chemotherapy  - emphasized again the palliative intent of Left axillary tumor debulking and the likely possibility that this will cease to remain effective as tumor grows/extends  - despite the above limitations and overall challenging prognosis, all risks, benefits, and alternatives to palliative Left axillary tumor debulking were discussed clearly and at length with patient and his family, all their questions were answered to their expressed satisfaction, patient expresses he wishes to proceed, and informed consent was obtained.  - will plan to proceed once more, possibly for the last time, with palliative Left axillary tumor debulking pending anesthesia and OR availability  - will once again request type and crossmatch, considering ongoing bleeding  - offered to order repeat CBC considering his ongoing oozing and relative hypotension, despite transfusion yesterday, but patient expresses he'd rather not be stuck again with another needle today  -  anticipate return to clinic 1 week following above elective procedure   - instructed to call office if any questions or concerns  All of the above recommendations were discussed with the patient, his wife, and his daugher, and all of patient's and family's questions were answered to their expressed satisfaction.  -- Marilynne Drivers Rosana Hoes, MD, [place]: [facility] - Partnering for exceptional care. Office: [phone]

## 2018-11-30 NOTE — Progress Notes (Addendum)
Surgical Clinic Progress/Follow-up Note   HPI:  68 y.o. very pleasant and unfortunate Male presents to clinic for follow-up evaluation of his chronic Left axillary wound/mass associated with known metastatic malignant melanoma, for which he has continued to require multiple PRBC transfusions (including again yesterday). Patient underwent palliative Left axillary tumor debulking 1 month ago, following which he expressed gratitude for reduced pain/discomfort and far improved LUE range of motion with decreased bleeding, all of which have now since recurred with regrowth of the bleeding Left axillary mass. Patient otherwise denies any fever/chills, N/V, constipation, CP, or SOB. He and his family acknowledge the challenges he faces with limited options, present again today requesting palliative debridement to achieve symptomatic relief similar to that achieved twice previously.  Review of Systems:  Constitutional: denies any other weight loss, fever, chills, or sweats  Eyes: denies any other vision changes, history of eye injury  ENT: denies sore throat, hearing problems  Respiratory: denies shortness of breath, wheezing  Cardiovascular: denies chest pain, palpitations  Gastrointestinal: denies abdominal pain, N/V, or diarrhea Musculoskeletal: denies any other joint pains or cramps  Skin: Denies any other rashes or skin discolorations except as per interval history Neurological: denies any other headache, dizziness, weakness  Psychiatric: denies any other depression, anxiety  All other review of systems: otherwise negative   Vital Signs:  BP (!) 61/60   Pulse 79   Temp (!) 97.3 F (36.3 C) (Skin)   Ht 6' (1.829 m)   Wt 163 lb (73.9 kg)   SpO2 97%   BMI 22.11 kg/m    Physical Exam:  Constitutional:  -- Normal body habitus  -- Awake, alert, and oriented x3  Eyes:  -- Pupils equally round and reactive to light  -- No scleral icterus  Ear, nose, throat:  -- No jugular venous  distension  -- No nasal drainage, bleeding Pulmonary:  -- No crackles -- Equal breath sounds bilaterally -- Breathing non-labored at rest Cardiovascular:  -- S1, S2 present  -- No pericardial rubs  Gastrointestinal:  -- Soft, nontender, non-distended, no guarding/rebound  -- No abdominal masses appreciated, pulsatile or otherwise  Musculoskeletal / Integumentary:  -- Wounds or skin discoloration: large, tender to palpation, friable, and oozing necrotic malodorous Left axillary mass, now with more extensive tunneling peripherally around the circumference of his wound as well as through more anterior-apical skin, previously less involved, still without erythema/purulence -- Extremities: B/L UE and LE FROM, hands and feet warm, +unchanged LUE lymphedema  Neurologic:  -- Motor function: intact and symmetric  -- Sensation: intact and symmetric   Laboratory studies:  CBC Latest Ref Rng & Units 11/29/2018 11/16/2018 11/15/2018  WBC 4.0 - 10.5 K/uL 10.7(H) 10.0 9.9  Hemoglobin 13.0 - 17.0 g/dL 7.1(L) 9.2(L) 6.2(L)  Hematocrit 39.0 - 52.0 % 23.8(L) 29.3(L) 21.4(L)  Platelets 150 - 400 K/uL 402(H) 334 368   CMP Latest Ref Rng & Units 11/29/2018 11/16/2018 11/15/2018  Glucose 70 - 99 mg/dL 104(H) 115(H) 126(H)  BUN 8 - 23 mg/dL 22 16 17   Creatinine 0.61 - 1.24 mg/dL 0.89 0.70 0.95  Sodium 135 - 145 mmol/L 136 139 138  Potassium 3.5 - 5.1 mmol/L 3.7 3.9 3.6  Chloride 98 - 111 mmol/L 106 107 106  CO2 22 - 32 mmol/L 26 27 25   Calcium 8.9 - 10.3 mg/dL 7.5(L) 7.8(L) 7.5(L)  Total Protein 6.5 - 8.1 g/dL 4.9(L) 5.6(L) 5.0(L)  Total Bilirubin 0.3 - 1.2 mg/dL 0.3 0.7 0.3  Alkaline Phos 38 - 126 U/L  36(L) 40 36(L)  AST 15 - 41 U/L 12(L) 18 12(L)  ALT 0 - 44 U/L 9 11 8    Imaging: No new pertinent imaging studies available for review at this time   Assessment:  68 y.o. yo Male with a problem list including...  Patient Active Problem List   Diagnosis Date Noted  . Cancer associated pain 2018-11-14   . On antineoplastic chemotherapy 2018-11-14  . 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 Malignant melanoma of left upper extremity including shoulder ([place])   . Open wound of left axillary region with complication 59/16/3846  . Anemia 05/10/2018  . Ileus ([place])   . Constipation 10/10/2017  . Metastatic melanoma ([place]) 07/07/2017  . Axillary mass, left 06/26/2017  . Chronic pain of left knee 12/14/2015  . Influenza vaccination declined 11/27/2015    presents to clinic for follow-up evaluation of his chronic enlarging, painful, bleeding Left axillary wound and mass associated with known metastatic malignant melanoma, for which he has continued to require multiple PRBC transfusions (including again yesterday), despite which he presents today with relative hypotension s/p palliative Left axillary tumor debulking x2.  Plan:   - once again discussed progression of patient's locally advanced unresectable and known metastatic melanoma, otherwise controlled by systemic chemotherapy  - emphasized again the palliative intent of Left axillary tumor debulking and the likely possibility that this will cease to remain effective as tumor grows/extends  - despite the above limitations and overall challenging prognosis, all risks, benefits, and alternatives to palliative Left axillary tumor debulking were discussed clearly and at length with patient and his family, all their questions were answered to their expressed satisfaction, patient expresses he wishes to proceed, and informed consent was obtained.  - will plan to proceed once more, possibly for the last time, with palliative Left axillary tumor debulking pending anesthesia and OR availability  - will once again request type and crossmatch, considering ongoing bleeding  - offered to order repeat CBC considering his ongoing oozing and relative hypotension, despite transfusion yesterday, but patient expresses he'd rather not be stuck again with another needle today  -  anticipate return to clinic 1 week following above elective procedure   - instructed to call office if any questions or concerns  All of the above recommendations were discussed with the patient, his wife, and his daugher, and all of patient's and family's questions were answered to their expressed satisfaction.  -- Marilynne Drivers Rosana Hoes, MD, [place]: [facility] - Partnering for exceptional care. Office: [phone]

## 2018-11-30 NOTE — Patient Instructions (Addendum)
You are scheduled for surgery at [facility] with Dr Rosana Hoes on 12/06/18. You may need to pre admit prior to surgery, we will call you about this. Please refer to your preoperative surgery instruction sheet.

## 2018-12-01 ENCOUNTER — Ambulatory Visit
Admission: RE | Admit: 2018-12-01 | Discharge: 2018-12-01 | Disposition: A | Discharge status: Home / Self Care | Payer: Medicare Other | Source: Ambulatory Visit | Attending: Oncology | Admitting: Oncology

## 2018-12-01 ENCOUNTER — Telehealth: Payer: Self-pay

## 2018-12-01 DIAGNOSIS — I071 Rheumatic tricuspid insufficiency: Secondary | ICD-10-CM | POA: Diagnosis not present

## 2018-12-01 DIAGNOSIS — R0600 Dyspnea, unspecified: Secondary | ICD-10-CM | POA: Diagnosis not present

## 2018-12-01 DIAGNOSIS — C799 Secondary malignant neoplasm of unspecified site: Secondary | ICD-10-CM | POA: Diagnosis not present

## 2018-12-01 DIAGNOSIS — C439 Malignant melanoma of skin, unspecified: Secondary | ICD-10-CM

## 2018-12-01 DIAGNOSIS — D649 Anemia, unspecified: Secondary | ICD-10-CM | POA: Diagnosis not present

## 2018-12-01 NOTE — Progress Notes (Signed)
*  PRELIMINARY RESULTS* Echocardiogram 2D Echocardiogram has been performed.  Louis Davies 12/01/2018, 11:22 AM

## 2018-12-01 NOTE — Telephone Encounter (Signed)
Spoke with Louis Davies and let her know that [name] would be a phone interview for pre admit testing and they would be calling on 12/03/18.

## 2018-12-03 ENCOUNTER — Encounter
Admission: RE | Admit: 2018-12-03 | Discharge: 2018-12-03 | Disposition: A | Discharge status: Home / Self Care | Payer: Medicare Other | Source: Ambulatory Visit | Attending: Surgery | Admitting: Surgery

## 2018-12-03 ENCOUNTER — Other Ambulatory Visit: Payer: Self-pay

## 2018-12-03 NOTE — Patient Instructions (Signed)
Your procedure is scheduled on: 12-06-18 Report to Same Day Surgery 2nd floor medical mall [facility] Entrance-take elevator on left to 2nd floor.  Check in with surgery information desk.) To find out your arrival time please call [phone] between 1PM - 3PM on 12-03-18  Remember: Instructions that are not followed completely may result in serious medical risk, up to and including death, or upon the discretion of your surgeon and anesthesiologist your surgery may need to be rescheduled.    _x___ 1. Do not eat food after midnight the night before your procedure. You may drink clear liquids up to 2 hours before you are scheduled to arrive at the hospital for your procedure.  Do not drink clear liquids within 2 hours of your scheduled arrival to the hospital.  Clear liquids include  --Water or Apple juice without pulp  --Clear carbohydrate beverage such as ClearFast or Gatorade  --Black Coffee or Clear Tea (No milk, no creamers, do not add anything to the coffee or Tea   ____Ensure clear carbohydrate drink on the way to the hospital for bariatric patients  ____Ensure clear carbohydrate drink 3 hours before surgery for Dr Dwyane Luo patients if physician instructed.   No gum chewing or hard candies.     __x__ 2. No Alcohol for 24 hours before or after surgery.   __x__3. No Smoking or e-cigarettes for 24 prior to surgery.  Do not use any chewable tobacco products for at least 6 hour prior to surgery   ____  4. Bring all medications with you on the day of surgery if instructed.    __x__ 5. Notify your doctor if there is any change in your medical condition     (cold, fever, infections).    x___6. On the morning of surgery brush your teeth with toothpaste and water.  You may rinse your mouth with mouth wash if you wish.  Do not swallow any toothpaste or mouthwash.   Do not wear jewelry, make-up, hairpins, clips or nail polish.  Do not wear lotions, powders, or perfumes. You may wear  deodorant.  Do not shave 48 hours prior to surgery. Men may shave face and neck.  Do not bring valuables to the hospital.    [facility] is not responsible for any belongings or valuables.               Contacts, dentures or bridgework may not be worn into surgery.  Leave your suitcase in the car. After surgery it may be brought to your room.  For patients admitted to the hospital, discharge time is determined by your treatment team.  _  Patients discharged the day of surgery will not be allowed to drive home.  You will need someone to drive you home and stay with you the night of your procedure.    Please read over the following fact sheets that you were given:   [facility] Preparing for Surgery   _x___ Take anti-hypertensive listed below, cardiac, seizure, asthma, anti-reflux and psychiatric medicines. These include:  1. YOU MAY TAKE OXYCODONE DAY OF SURGERY IF NEEDED WITH SMALL SIP OF WATER  2.  3.  4.  5.  6.  ____Fleets enema or Magnesium Citrate as directed.   ____ Use CHG Soap or sage wipes as directed on instruction sheet   ____ Use inhalers on the day of surgery and bring to hospital day of surgery  ____ Stop Metformin and Janumet 2 days prior to surgery.    ____  Take 1/2 of usual insulin dose the night before surgery and none on the morning surgery.   ____ Follow recommendations from Cardiologist, Pulmonologist or PCP regarding stopping Aspirin, Coumadin, Plavix ,Eliquis, Effient, or Pradaxa, and Pletal.  X____Stop Anti-inflammatories such as Advil, Aleve, Ibuprofen, Motrin, Naproxen, Naprosyn, Goodies powders or aspirin products NOW-OK to take Tylenol    ____ Stop supplements until after surgery.     ____ Bring C-Pap to the hospital.

## 2018-12-06 ENCOUNTER — Other Ambulatory Visit: Payer: Self-pay

## 2018-12-06 ENCOUNTER — Encounter: Payer: Self-pay | Admitting: Anesthesiology

## 2018-12-06 ENCOUNTER — Ambulatory Visit: Payer: Medicare Other | Admitting: Occupational Therapy

## 2018-12-06 ENCOUNTER — Ambulatory Visit: Payer: Medicare Other | Admitting: Anesthesiology

## 2018-12-06 ENCOUNTER — Ambulatory Visit
Admission: RE | Admit: 2018-12-06 | Discharge: 2018-12-06 | Disposition: A | Discharge status: Home / Self Care | Payer: Medicare Other | Attending: Surgery | Admitting: Surgery

## 2018-12-06 ENCOUNTER — Encounter
Admission: RE | Disposition: A | Discharge status: Home / Self Care | Payer: Self-pay | Source: Home / Self Care | Attending: Surgery

## 2018-12-06 DIAGNOSIS — D492 Neoplasm of unspecified behavior of bone, soft tissue, and skin: Secondary | ICD-10-CM | POA: Diagnosis not present

## 2018-12-06 DIAGNOSIS — C4359 Malignant melanoma of other part of trunk: Secondary | ICD-10-CM | POA: Diagnosis not present

## 2018-12-06 DIAGNOSIS — C799 Secondary malignant neoplasm of unspecified site: Secondary | ICD-10-CM

## 2018-12-06 DIAGNOSIS — D649 Anemia, unspecified: Secondary | ICD-10-CM | POA: Insufficient documentation

## 2018-12-06 DIAGNOSIS — C439 Malignant melanoma of skin, unspecified: Secondary | ICD-10-CM

## 2018-12-06 DIAGNOSIS — I96 Gangrene, not elsewhere classified: Secondary | ICD-10-CM | POA: Insufficient documentation

## 2018-12-06 DIAGNOSIS — R2232 Localized swelling, mass and lump, left upper limb: Secondary | ICD-10-CM | POA: Diagnosis present

## 2018-12-06 DIAGNOSIS — M1990 Unspecified osteoarthritis, unspecified site: Secondary | ICD-10-CM | POA: Diagnosis not present

## 2018-12-06 HISTORY — PX: AXILLARY LYMPH NODE DISSECTION: SHX5229

## 2018-12-06 LAB — CBC WITH DIFFERENTIAL/PLATELET
Abs Immature Granulocytes: 0.1 10*3/uL — ABNORMAL HIGH (ref 0.00–0.07)
BASOS PCT: 0 %
Basophils Absolute: 0 10*3/uL (ref 0.0–0.1)
Eosinophils Absolute: 0 10*3/uL (ref 0.0–0.5)
Eosinophils Relative: 0 %
HCT: 22.1 % — ABNORMAL LOW (ref 39.0–52.0)
Hemoglobin: 6.8 g/dL — ABNORMAL LOW (ref 13.0–17.0)
Immature Granulocytes: 1 %
Lymphocytes Relative: 5 %
Lymphs Abs: 0.6 10*3/uL — ABNORMAL LOW (ref 0.7–4.0)
MCH: 24.2 pg — ABNORMAL LOW (ref 26.0–34.0)
MCHC: 30.8 g/dL (ref 30.0–36.0)
MCV: 78.6 fL — ABNORMAL LOW (ref 80.0–100.0)
Monocytes Absolute: 1.1 10*3/uL — ABNORMAL HIGH (ref 0.1–1.0)
Monocytes Relative: 8 %
NRBC: 0 % (ref 0.0–0.2)
Neutro Abs: 11.9 10*3/uL — ABNORMAL HIGH (ref 1.7–7.7)
Neutrophils Relative %: 86 %
Platelets: 339 10*3/uL (ref 150–400)
RBC: 2.81 MIL/uL — AB (ref 4.22–5.81)
RDW: 18.5 % — AB (ref 11.5–15.5)
WBC: 13.8 10*3/uL — ABNORMAL HIGH (ref 4.0–10.5)

## 2018-12-06 LAB — PREPARE RBC (CROSSMATCH)

## 2018-12-06 LAB — CBC
HCT: 27 % — ABNORMAL LOW (ref 39.0–52.0)
Hemoglobin: 8.6 g/dL — ABNORMAL LOW (ref 13.0–17.0)
MCH: 25.5 pg — ABNORMAL LOW (ref 26.0–34.0)
MCHC: 31.9 g/dL (ref 30.0–36.0)
MCV: 80.1 fL (ref 80.0–100.0)
Platelets: 334 10*3/uL (ref 150–400)
RBC: 3.37 MIL/uL — ABNORMAL LOW (ref 4.22–5.81)
RDW: 18.1 % — ABNORMAL HIGH (ref 11.5–15.5)
WBC: 16.9 10*3/uL — AB (ref 4.0–10.5)
nRBC: 0 % (ref 0.0–0.2)

## 2018-12-06 SURGERY — LYMPHADENECTOMY, AXILLARY
Anesthesia: General | Laterality: Left

## 2018-12-06 MED ORDER — FENTANYL CITRATE (PF) 100 MCG/2ML IJ SOLN: 25.0000 ug | INTRAMUSCULAR | Status: DC | PRN

## 2018-12-06 MED ORDER — OXYCODONE HCL 5 MG/5ML PO SOLN: 5.0000 mg | Freq: Once | ORAL | Status: DC | PRN

## 2018-12-06 MED ORDER — OXYCODONE HCL 5 MG PO TABS: 5.0000 mg | ORAL_TABLET | Freq: Once | ORAL | Status: DC | PRN

## 2018-12-06 MED ORDER — PHENYLEPHRINE HCL 10 MG/ML IJ SOLN
INTRAMUSCULAR | Status: AC
  Filled 2018-12-06: qty 1

## 2018-12-06 MED ORDER — LIDOCAINE HCL (CARDIAC) PF 100 MG/5ML IV SOSY
PREFILLED_SYRINGE | INTRAVENOUS | Status: DC | PRN
  Administered 2018-12-06: 50 mg via INTRAVENOUS

## 2018-12-06 MED ORDER — KETAMINE HCL 50 MG/ML IJ SOLN
INTRAMUSCULAR | Status: DC | PRN
  Administered 2018-12-06: 50 mg via INTRAVENOUS

## 2018-12-06 MED ORDER — ACETAMINOPHEN 500 MG PO TABS
1000.0000 mg | ORAL_TABLET | ORAL | Status: AC
  Administered 2018-12-06: 1000 mg via ORAL

## 2018-12-06 MED ORDER — SUCCINYLCHOLINE CHLORIDE 20 MG/ML IJ SOLN
INTRAMUSCULAR | Status: DC | PRN
  Administered 2018-12-06: 100 mg via INTRAVENOUS

## 2018-12-06 MED ORDER — FAMOTIDINE 20 MG PO TABS
ORAL_TABLET | ORAL | Status: AC
  Filled 2018-12-06: qty 1

## 2018-12-06 MED ORDER — LIDOCAINE HCL (PF) 1 % IJ SOLN
INTRAMUSCULAR | Status: AC
  Filled 2018-12-06: qty 30

## 2018-12-06 MED ORDER — ROCURONIUM BROMIDE 100 MG/10ML IV SOLN
INTRAVENOUS | Status: DC | PRN
  Administered 2018-12-06: 20 mg via INTRAVENOUS

## 2018-12-06 MED ORDER — SODIUM CHLORIDE 0.9 % IV SOLN
INTRAVENOUS | Status: DC | PRN
  Administered 2018-12-06: 50 ug/min via INTRAVENOUS

## 2018-12-06 MED ORDER — CHLORHEXIDINE GLUCONATE CLOTH 2 % EX PADS: 6.0000 | MEDICATED_PAD | Freq: Once | CUTANEOUS | Status: DC

## 2018-12-06 MED ORDER — ACETAMINOPHEN 500 MG PO TABS
ORAL_TABLET | ORAL | Status: AC
  Filled 2018-12-06: qty 2

## 2018-12-06 MED ORDER — PROPOFOL 10 MG/ML IV BOLUS
INTRAVENOUS | Status: DC | PRN
  Administered 2018-12-06: 150 mg via INTRAVENOUS

## 2018-12-06 MED ORDER — MIDAZOLAM HCL 2 MG/2ML IJ SOLN
INTRAMUSCULAR | Status: AC
  Filled 2018-12-06: qty 2

## 2018-12-06 MED ORDER — DEXAMETHASONE SODIUM PHOSPHATE 10 MG/ML IJ SOLN
INTRAMUSCULAR | Status: DC | PRN
  Administered 2018-12-06: 10 mg via INTRAVENOUS

## 2018-12-06 MED ORDER — SUGAMMADEX SODIUM 200 MG/2ML IV SOLN
INTRAVENOUS | Status: AC
  Filled 2018-12-06: qty 2

## 2018-12-06 MED ORDER — FENTANYL CITRATE (PF) 100 MCG/2ML IJ SOLN
INTRAMUSCULAR | Status: DC | PRN
  Administered 2018-12-06: 100 ug via INTRAVENOUS

## 2018-12-06 MED ORDER — PHENYLEPHRINE HCL 10 MG/ML IJ SOLN
INTRAMUSCULAR | Status: DC | PRN
  Administered 2018-12-06: 100 ug via INTRAVENOUS
  Administered 2018-12-06 (×3): 200 ug via INTRAVENOUS

## 2018-12-06 MED ORDER — BUPIVACAINE HCL (PF) 0.5 % IJ SOLN
INTRAMUSCULAR | Status: AC
  Filled 2018-12-06: qty 30

## 2018-12-06 MED ORDER — GABAPENTIN 300 MG PO CAPS
300.0000 mg | ORAL_CAPSULE | ORAL | Status: AC
  Administered 2018-12-06: 300 mg via ORAL

## 2018-12-06 MED ORDER — GABAPENTIN 300 MG PO CAPS
ORAL_CAPSULE | ORAL | Status: AC
  Filled 2018-12-06: qty 1

## 2018-12-06 MED ORDER — LACTATED RINGERS IV SOLN
INTRAVENOUS | Status: DC
  Administered 2018-12-06: 08:00:00 via INTRAVENOUS

## 2018-12-06 MED ORDER — MIDAZOLAM HCL 2 MG/2ML IJ SOLN
INTRAMUSCULAR | Status: DC | PRN
  Administered 2018-12-06: 2 mg via INTRAVENOUS

## 2018-12-06 MED ORDER — EPHEDRINE SULFATE 50 MG/ML IJ SOLN
INTRAMUSCULAR | Status: DC | PRN
  Administered 2018-12-06: 10 mg via INTRAVENOUS
  Administered 2018-12-06: 20 mg via INTRAVENOUS
  Administered 2018-12-06: 10 mg via INTRAVENOUS

## 2018-12-06 MED ORDER — GLYCOPYRROLATE 0.2 MG/ML IJ SOLN
INTRAMUSCULAR | Status: DC | PRN
  Administered 2018-12-06: 0.2 mg via INTRAVENOUS

## 2018-12-06 MED ORDER — FAMOTIDINE 20 MG PO TABS
20.0000 mg | ORAL_TABLET | Freq: Once | ORAL | Status: AC
  Administered 2018-12-06: 20 mg via ORAL

## 2018-12-06 MED ORDER — EPHEDRINE SULFATE 50 MG/ML IJ SOLN
INTRAMUSCULAR | Status: AC
  Filled 2018-12-06: qty 1

## 2018-12-06 MED ORDER — FENTANYL CITRATE (PF) 100 MCG/2ML IJ SOLN
INTRAMUSCULAR | Status: AC
  Filled 2018-12-06: qty 2

## 2018-12-06 MED ORDER — ONDANSETRON HCL 4 MG/2ML IJ SOLN
INTRAMUSCULAR | Status: DC | PRN
  Administered 2018-12-06: 4 mg via INTRAVENOUS

## 2018-12-06 SURGICAL SUPPLY — 32 items
BLADE SURG 15 STRL LF DISP TIS (BLADE) ×1 IMPLANT
BLADE SURG 15 STRL SS (BLADE) ×2
CANISTER SUCT 1200ML W/VALVE (MISCELLANEOUS) ×3 IMPLANT
CHLORAPREP W/TINT 26ML (MISCELLANEOUS) ×3 IMPLANT
COVER PROBE FLX POLY STRL (MISCELLANEOUS) ×3 IMPLANT
COVER WAND RF STERILE (DRAPES) ×3 IMPLANT
DERMABOND ADVANCED (GAUZE/BANDAGES/DRESSINGS) ×2
DERMABOND ADVANCED .7 DNX12 (GAUZE/BANDAGES/DRESSINGS) ×1 IMPLANT
DEVICE DUBIN SPECIMEN MAMMOGRA (MISCELLANEOUS) ×3 IMPLANT
DRAPE LAPAROTOMY TRNSV 106X77 (MISCELLANEOUS) ×3 IMPLANT
ELECT CAUTERY BLADE 6.4 (BLADE) ×3 IMPLANT
ELECT REM PT RETURN 9FT ADLT (ELECTROSURGICAL) ×3
ELECTRODE REM PT RTRN 9FT ADLT (ELECTROSURGICAL) ×1 IMPLANT
GLOVE BIO SURGEON STRL SZ7 (GLOVE) ×3 IMPLANT
GLOVE INDICATOR 7.5 STRL GRN (GLOVE) ×3 IMPLANT
GOWN STRL REUS W/ TWL LRG LVL3 (GOWN DISPOSABLE) ×1 IMPLANT
GOWN STRL REUS W/ TWL XL LVL3 (GOWN DISPOSABLE) ×1 IMPLANT
GOWN STRL REUS W/TWL LRG LVL3 (GOWN DISPOSABLE) ×2
GOWN STRL REUS W/TWL XL LVL3 (GOWN DISPOSABLE) ×2
LABEL OR SOLS (LABEL) ×3 IMPLANT
MARGIN MAP 10MM (MISCELLANEOUS) ×3 IMPLANT
NEEDLE HYPO 25X1 1.5 SAFETY (NEEDLE) ×3 IMPLANT
PACK BASIN MINOR ARMC (MISCELLANEOUS) ×3 IMPLANT
SPONGE LAP 18X18 RF (DISPOSABLE) ×6 IMPLANT
SUT MNCRL 4-0 (SUTURE) ×2
SUT MNCRL 4-0 27XMFL (SUTURE) ×1
SUT SILK 2 0 SH (SUTURE) ×3 IMPLANT
SUT VIC AB 3-0 SH 27 (SUTURE) ×2
SUT VIC AB 3-0 SH 27X BRD (SUTURE) ×1 IMPLANT
SUTURE MNCRL 4-0 27XMF (SUTURE) ×1 IMPLANT
SYR 10ML LL (SYRINGE) ×3 IMPLANT
WATER STERILE IRR 1000ML POUR (IV SOLUTION) ×3 IMPLANT

## 2018-12-06 NOTE — Op Note (Signed)
SURGICAL OPERATIVE REPORT  DATE OF PROCEDURE: 12/06/2018  ATTENDING Surgeon(s): Vickie Epley, MD  ANESTHESIA: general   PRE-OPERATIVE DIAGNOSIS: Large bleeding, necrotic Left axillary mass(icd-10's: L98.493, I96, C43.9)  POST-OPERATIVE DIAGNOSIS: Large bleeding, necrotic Left axillary mass(icd-10's: L98.493, I96, C43.9)  PROCEDURE(S):  1.)Excision of large necrotic Left axillary mass (10cm x 9 cm irregular mass/wound)(cpt:24071) 2.) Sharp excisional debridement ofLeftaxillarysoft tissue/tumornecrosis(cpt's: 11043, 11046x2)  INTRAOPERATIVE FINDINGS: 10 cm x 9 cm large irregular exophytic friable oozing partially necrotic Left axillary mass with associatednecrotic muscle, tumor, subcutaneous fat, andskin  INTRAVENOUS FLUIDS: 600 mL crystalloid   ESTIMATED BLOOD LOSS: 50 mL   URINE OUTPUT: No Foley   SPECIMENS: None  IMPLANTS: 2 rolls Kerlix gauze packing  DRAINS: none  COMPLICATIONS: None apparent  CONDITION AT END OF PROCEDURE: Hemodynamically stable and extubated  DISPOSITION OF PATIENT: PACU  INDICATIONS FOR PROCEDURE:  Patient is a19 y.o.malewith a history of metastatic melanoma s/p open Left axillary biopsy (06/2017), treated initially with immunotherapy until patient developed bowel perforation, requiring laparotomy (09/2017), following which patient was started on oral chemotherapy, on which PET-CT demonstrated Left axillary tumor necrosis withoutcentral hypermetabolism. Patient also underwent coil embolization of Left subscapular and lateral thoracic arteries (04/2018) for Left axillary bleeding mass/wound. More recently, patient's open Left axillary wound was managed by wound clinic until wound developed foul odor with enlarging necrotic Left axillary mass, for which he was referred to surgery and underwent excision with debridement. Post-operatively, patient expressed his range of motion and pain had significantly improved with  resolved bleeding until the Left axillary mass again recurred, along with his associated symptoms, limited LUE range of motion, and bleeding, for which he has required transfusion. Most recent PET-CT now also again demonstrates Left axillary hypermetabolism. Further evaluation at [place] clinic(s) confirm limited additional treatment options. Patient and his family once more requested palliative re-excision and debridement, for which patient was once again referred. All risks, benefits, and alternatives toexcision of large necrotic Left axillary massand debridementofLeft axillarywound necrosiswere discussed with the patient, all of patient's questions were answered to hisexpressed satisfaction, and informed consent was obtained and documented.  DETAILS OF PROCEDURE: Patient was brought to the operating suite and appropriately identified. General anesthesia was administered along with appropriate pre-operative antibiotics, and endotracheal intubation was performed by anesthetist. In supine position, operative site was prepped and draped in the usual sterile fashion, and following a brief time out, a large portion of patient's Left axillary mass was excised, initially using electrocautery, followed by straight Mayo scissors. Additional necrotic Left axillary mass was removed and extracted using blunt finger fracture. Following excision/extraction of the large necrotic Left axillary mass, thereremained significant additional tissue/tumor necrosis, whichwas sharply debrided using scissorsand selective electrocautery. Necrosis extended to and included muscle as well as subcutaneous tissues.   Hemostasis was achieved using primarily direct pressure and selective electrocautery. Surrounding skin was cleaned and dried, after which wound was then packed using Kerlix gauze rolls x2 and secured in place with an ACE wrap. Patient was then safely able to be extubated, awakened, and transferred to PACU for  post-operative monitoring and care. Despite having recently been transfused 2 units PRBC by cancer center, patient's pre-operative hemoglobin was found to be 6.8. Accordingly, decision was made to transfuse 2 additional units PRBC in PACU as was discussed pre-operatively with post-transfusion CBC.  I was present for all aspects of the above procedure, and no operative complications were apparent.

## 2018-12-06 NOTE — Progress Notes (Signed)
Dr. Rosana Hoes was okay with the patient leaving with a Hgb change from 6.8-8.6 after 2 units of blood. I told the pt if they needed anything call Dr. Shann Medal office.

## 2018-12-06 NOTE — Interval H&P Note (Signed)
History and Physical Interval Note:  12/06/2018 7:59 AM  Louis Davies  has presented today for surgery, with the diagnosis of MELANOMA  The various methods of treatment have been discussed with the patient and family. After consideration of risks, benefits and other options for treatment, the patient has consented to  Procedure(s): PALLIATIVE DEBULKING OF AXILLARY TUMOR-LEFT (Left) as a surgical intervention .  The patient's history has been reviewed, patient examined, no change in status, stable for surgery.  I have reviewed the patient's chart and labs.  Questions were answered to the patient's satisfaction.     Vickie Epley

## 2018-12-06 NOTE — Anesthesia Post-op Follow-up Note (Signed)
Anesthesia QCDR form completed.        

## 2018-12-06 NOTE — Transfer of Care (Signed)
Immediate Anesthesia Transfer of Care Note  Patient: Louis Davies  Procedure(s) Performed: PALLIATIVE DEBULKING OF AXILLARY TUMOR-LEFT (Left )  Patient Location: PACU  Anesthesia Type:General  Level of Consciousness: awake  Airway & Oxygen Therapy: Patient Spontanous Breathing and Patient connected to face mask oxygen  Post-op Assessment: Report given to RN and Post -op Vital signs reviewed and stable  Post vital signs: Reviewed  Last Vitals:  Vitals Value Taken Time  BP 88/57 12/06/2018  9:58 AM  Temp    Pulse 102 12/06/2018  9:57 AM  Resp 16 12/06/2018  9:58 AM  SpO2 100 % 12/06/2018  9:57 AM  Vitals shown include unvalidated device data.  Last Pain:  Vitals:   12/06/18 0744  TempSrc: Temporal  PainSc: 8          Complications: No apparent anesthesia complications

## 2018-12-06 NOTE — Anesthesia Preprocedure Evaluation (Signed)
Anesthesia Evaluation    History of Anesthesia Complications (+) PONV and history of anesthetic complications  Airway Mallampati: II  TM Distance: <3 FB Neck ROM: limited    Dental  (+) Poor Dentition, Chipped, Missing   Pulmonary shortness of breath, neg sleep apnea, neg COPD,           Cardiovascular (-) hypertension(-) Past MI and (-) CHF (-) dysrhythmias (-) Valvular Problems/Murmurs     Neuro/Psych neg Seizures    GI/Hepatic Neg liver ROS, neg GERD  ,  Endo/Other  neg diabetes  Renal/GU negative Renal ROS     Musculoskeletal   Abdominal   Peds  Hematology  (+) Blood dyscrasia, anemia ,   Anesthesia Other Findings Past Medical History: No date: Anemia No date: Arthritis No date: Cancer (HCC)     Comment:  MELANOMA No date: Complication of anesthesia No date: Dyspnea     Comment:  doe No date: Perforated viscus No date: PONV (postoperative nausea and vomiting)  BMI    Body Mass Index:  22.10 kg/m      Reproductive/Obstetrics                             Anesthesia Physical  Anesthesia Plan  ASA: III  Anesthesia Plan: General   Post-op Pain Management:    Induction: Intravenous  PONV Risk Score and Plan: 3 and TIVA, Propofol infusion and Ondansetron  Airway Management Planned: LMA  Additional Equipment:   Intra-op Plan:   Post-operative Plan: Extubation in OR  Informed Consent: I have reviewed the patients History and Physical, chart, labs and discussed the procedure including the risks, benefits and alternatives for the proposed anesthesia with the patient or authorized representative who has indicated his/her understanding and acceptance.     Dental Advisory Given  Plan Discussed with: Anesthesiologist, CRNA and Surgeon  Anesthesia Plan Comments: (Patient consented for risks of anesthesia including but not limited to:  - adverse reactions to medications -  damage to teeth, lips or other oral mucosa - sore throat or hoarseness - Damage to heart, brain, lungs or loss of life  Patient voiced understanding.)        Anesthesia Quick Evaluation

## 2018-12-06 NOTE — Anesthesia Procedure Notes (Signed)
Procedure Name: Intubation Performed by: Rolla Plate, CRNA Pre-anesthesia Checklist: Patient identified, Patient being monitored, Timeout performed, Emergency Drugs available and Suction available Patient Re-evaluated:Patient Re-evaluated prior to induction Oxygen Delivery Method: Circle system utilized Preoxygenation: Pre-oxygenation with 100% oxygen Induction Type: IV induction, Rapid sequence and Cricoid Pressure applied Laryngoscope Size: Mac, Sabra Heck and 2 Grade View: Grade I Tube type: Oral Tube size: 7.5 mm Number of attempts: 1 Airway Equipment and Method: Stylet Placement Confirmation: ETT inserted through vocal cords under direct vision,  positive ETCO2 and breath sounds checked- equal and bilateral Secured at: 23 cm Tube secured with: Tape Dental Injury: Teeth and Oropharynx as per pre-operative assessment

## 2018-12-06 NOTE — Anesthesia Postprocedure Evaluation (Signed)
Anesthesia Post Note  Patient: Louis Davies  Procedure(s) Performed: PALLIATIVE DEBULKING OF AXILLARY TUMOR-LEFT (Left )  Patient location during evaluation: PACU Anesthesia Type: General Level of consciousness: awake and alert Pain management: pain level controlled Vital Signs Assessment: post-procedure vital signs reviewed and stable Respiratory status: spontaneous breathing, nonlabored ventilation, respiratory function stable and patient connected to nasal cannula oxygen Cardiovascular status: blood pressure returned to baseline and stable Postop Assessment: no apparent nausea or vomiting Anesthetic complications: no     Last Vitals:  Vitals:   12/06/18 1028 12/06/18 1049  BP: (!) 90/57 90/75  Pulse: (!) 104 95  Resp: (!) 23 18  Temp: 36.6 C 36.7 C  SpO2: 100% 100%    Last Pain:  Vitals:   12/06/18 1049  TempSrc: Temporal  PainSc:                  Precious Haws Piscitello

## 2018-12-06 NOTE — Discharge Instructions (Addendum)
In addition to included general post-operative instructions for Palliative Left Axillary Tumor Debulking,  Diet: Resume home heart healthy diet (eat whatever you'd like).   Activity: No heavy lifting >15 - 20 pounds (children, pets, laundry, garbage) or strenuous activity until follow-up next week, but light activity and walking are encouraged. Do not drive or drink alcohol if taking narcotic pain medications.  Wound care: Remove dressing in 2 days and change once daily + as needed. Once dressing removed, 2 days after surgery (Wednesday, 2/19), you may shower/get incision wet with soapy water and pat dry (do not rub) as you have been doing, but no baths or submerging incision underwater.   Medications: Resume all home medications. For mild to moderate pain: acetaminophen (Tylenol) or ibuprofen/naproxen (if no kidney disease). Combining Tylenol with alcohol can substantially increase your risk of causing liver disease. Narcotic pain medications, if prescribed, can be used for severe pain, though may cause nausea, constipation, and drowsiness. Do not combine Tylenol and Percocet (or similar) within a 6 hour period as Percocet (and similar) contain(s) Tylenol. If you do not need the narcotic pain medication, you do not need to fill the prescription.  Call office [phone]) at any time if any questions, worsening pain, fevers/chills, bleeding, drainage from incision site, or other concerns.  AMBULATORY SURGERY  DISCHARGE INSTRUCTIONS   1) The drugs that you were given will stay in your system until tomorrow so for the next 24 hours you should not:  A) Drive an automobile B) Make any legal decisions C) Drink any alcoholic beverage   2) You may resume regular meals tomorrow.  Today it is better to start with liquids and gradually work up to solid foods.  You may eat anything you prefer, but it is better to start with liquids, then soup and crackers, and gradually work up to solid  foods.   3) Please notify your doctor immediately if you have any unusual bleeding, trouble breathing, redness and pain at the surgery site, drainage, fever, or pain not relieved by medication.    4) Additional Instructions:        Please contact your physician with any problems or Same Day Surgery at [phone], Monday through Friday 6 am to 4 pm, or [place] at [facility] [place] number at [phone].

## 2018-12-07 ENCOUNTER — Encounter: Payer: Self-pay | Admitting: Surgery

## 2018-12-07 LAB — BPAM RBC
Blood Product Expiration Date: 202003032359
Blood Product Expiration Date: 202003042359
ISSUE DATE / TIME: 202002171035
ISSUE DATE / TIME: 202002171316
Unit Type and Rh: 1700
Unit Type and Rh: 9500

## 2018-12-07 LAB — TYPE AND SCREEN
ABO/RH(D): B NEG
Antibody Screen: NEGATIVE
Unit division: 0
Unit division: 0

## 2018-12-09 ENCOUNTER — Ambulatory Visit: Payer: Medicare Other | Admitting: Occupational Therapy

## 2018-12-12 NOTE — Progress Notes (Signed)
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  INTERVAL HISTORY: Patient returns to clinic for further evaluation, repeat blood work, and consideration of blood transfusion.  He continues to tolerate dose reduced encorafenib/binimetinib.  His weakness and fatigue are unchanged and he continues to have a decreased performance status.  He has increased pain in his left axilla. He has no neurologic complaints. He denies any recent fevers or illnesses. He has no chest pain, cough, or hemoptysis. He denies any nausea, vomiting, constipation, or diarrhea. He has no melena or hematochezia. He has no urinary complaints.  Patient offers no further specific complaints today.  REVIEW OF SYSTEMS:   Review of Systems  Constitutional: Positive for malaise/fatigue. Negative for fever and weight loss.  Respiratory: Negative.  Negative for cough and shortness of breath.   Cardiovascular: Negative.  Negative for chest pain and leg swelling.  Gastrointestinal: Negative.  Negative for abdominal pain, blood in stool, melena, nausea and vomiting.  Genitourinary: Negative.  Negative for dysuria.  Musculoskeletal: Negative.  Negative for myalgias.  Skin: Negative.  Negative for rash.  Neurological: Positive for weakness. Negative for sensory change and focal weakness.  Psychiatric/Behavioral: Negative.  The patient is not nervous/anxious.     As per HPI. Otherwise, a complete review of systems is negative.  PAST MEDICAL HISTORY: Past Medical History:  Diagnosis Date  . Anemia   . Arthritis   . Cancer ([place])    MELANOMA  . Complication of anesthesia   . Dyspnea    doe  . Perforated viscus   . PONV (postoperative nausea and vomiting)     PAST SURGICAL  HISTORY: Past Surgical History:  Procedure Laterality Date  . AXILLARY LYMPH NODE DISSECTION Left 06/26/2017   Procedure: EXCISION OF LEFT AXILLARY MASS;  Surgeon: Johnathan Hausen, MD;  Location: [facility];  Service: General;  Laterality: Left;  . AXILLARY LYMPH NODE DISSECTION Left 11/01/2018   Procedure: AXILLARY DEBRIDEMENT;  Surgeon: Vickie Epley, MD;  Location: [facility];  Service: General;  Laterality: Left;  . AXILLARY LYMPH NODE DISSECTION Left 12/06/2018   Procedure: PALLIATIVE DEBULKING OF AXILLARY TUMOR-LEFT;  Surgeon: Vickie Epley, MD;  Location: [facility] OR[facility];  Service: General;  Laterality: Left;  . DIAGNOSTIC LAPAROSCOPY    . EMBOLIZATION Left 05/11/2018   Procedure: EMBOLIZATION;  Surgeon: Katha Cabal, MD;  Location: [place] [facility];  Service: Cardiovascular;  Laterality: Left;  . LAPAROTOMY N/A 10/14/2017   Procedure: EXPLORATORY LAPAROTOMY;  Surgeon: Clayburn Pert, MD;  Location: [facility];  Service: General;  Laterality: N/A;  . VASECTOMY    . WOUND DEBRIDEMENT Left 09/24/2018   Procedure: LEFT AXILLARY EXCISIONAL DEBRIDEMENT OF NECROTIC TISSUE;  Surgeon: Vickie Epley, MD;  Location: [facility];  Service: General;  Laterality: Left;    FAMILY HISTORY: Family History  Problem Relation Age of Onset  . Diabetes Mother   . Hypertension Mother   . Leukemia Father   . Thyroid disease Sister   . Liver disease Maternal Grandmother   . Heart Problems Maternal Grandfather   . Diabetes Maternal Grandfather   . Lung cancer Paternal Grandmother   . Prostate cancer Paternal Grandfather     ADVANCED DIRECTIVES (Y/N):  N  HEALTH MAINTENANCE: Social History   Tobacco Use  . Smoking status:  Never Smoker  . Smokeless tobacco: Never Used  Substance Use Topics  . Alcohol use: No  . Drug use: No     Colonoscopy:  PAP:  Bone density:  Lipid panel:  No Known Allergies  Current Outpatient Medications  Medication Sig Dispense Refill  . Binimetinib  (MEKTOVI) 15 MG TABS Take 45 mg by mouth 2 (two) times daily. (Patient taking differently: Take 30 mg by mouth 2 (two) times daily. ) 180 tablet 2  . Encorafenib 75 MG CAPS Take 450 mg by mouth daily. (Patient taking differently: Take 300 mg by mouth daily. ) 180 capsule 2  . ibuprofen (ADVIL,MOTRIN) 200 MG tablet Take 200 mg by mouth every 6 (six) hours as needed for moderate pain.     . Multiple Vitamin (MULTIVITAMIN) tablet Take 1 tablet by mouth daily.    . prednisoLONE acetate (PRED FORTE) 1 % ophthalmic suspension Place 1 drop into both eyes 4 (four) times daily. 5 mL 0  . fentaNYL (DURAGESIC) 25 MCG/HR Place 1 patch onto the skin every 3 (three) days. 10 patch 0  . Oxycodone HCl 10 MG TABS Take 1 tablet (10 mg total) by mouth every 4 (four) hours as needed. 60 tablet 0   No current facility-administered medications for this visit.     OBJECTIVE: Vitals:   12/14/18 0843  BP: (!) 92/55  Pulse: 76  Temp: 98.5 F (36.9 C)     Body mass index is 21.51 kg/m.    ECOG FS:2 - Symptomatic, <50% confined to bed  General: Ill-appearing, thin, no acute distress. Eyes: Pink conjunctiva, anicteric sclera. HEENT: Normocephalic, moist mucous membranes. Lungs: Clear to auscultation bilaterally. Heart: Regular rate and rhythm. No rubs, murmurs, or gallops. Abdomen: Soft, nontender, nondistended. No organomegaly noted, normoactive bowel sounds. Musculoskeletal: No edema, cyanosis, or clubbing. Neuro: Alert, answering all questions appropriately. Cranial nerves grossly intact. Skin: No rashes or petechiae noted. Psych: Normal affect. Lymphatics: Large left axillary mass.    LAB RESULTS:  Lab Results  Component Value Date   NA 136 12/14/2018   K 3.8 12/14/2018   CL 105 12/14/2018   CO2 25 12/14/2018   GLUCOSE 104 (H) 12/14/2018   BUN 23 12/14/2018   CREATININE 0.94 12/14/2018   CALCIUM 7.5 (L) 12/14/2018   PROT 4.7 (L) 12/14/2018   ALBUMIN 2.0 (L) 12/14/2018   AST 11 (L) 12/14/2018    ALT 10 12/14/2018   ALKPHOS 38 12/14/2018   BILITOT 0.3 12/14/2018   GFRNONAA >60 12/14/2018   GFRAA >60 12/14/2018    Lab Results  Component Value Date   WBC 10.9 (H) 12/14/2018   NEUTROABS 9.4 (H) 12/14/2018   HGB 7.0 (L) 12/14/2018   HCT 23.3 (L) 12/14/2018   MCV 80.6 12/14/2018   PLT 402 (H) 12/14/2018     STUDIES: No results found.  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  PLAN:    1. 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  [place] input.  Patient now going to [facility] for another surgical opinion tomorrow.  He continues to require periodic debridement of his left axillary mass.  He is tolerating dose reduced encorafenib 300 mg daily plus binimetinib 30 mg daily much better.  Baseline cardiac echo on December 01, 2018 revealed an EF of 60 to 65%.  Return to clinic in 2 weeks  with repeat laboratory work and further evaluation.   2.  Left axilla: Appreciate surgical input.  Patient has an appointment with surgery later today and is going to [facility] tomorrow for an additional evaluation. 3.  Left arm swelling: Significantly improved.  Continue follow-up with lymphedema clinic. 4.  Poor appetite: Improved.  Continue Megace as prescribed.  Appreciate dietary input. 5.  Anemia: Patient's hemoglobin is 7.0 today proceed with 1 unit packed red blood cells.  Patient expressed understanding and was in agreement with this plan. He also understands that He can call clinic at any time with any questions, concerns, or complaints.   Cancer Staging Metastatic melanoma [facility]) Staging form: Melanoma of the Skin, AJCC 8th Edition - Clinical stage from 07/15/2017: Stage IV (cTX, cN1, cM1c(0)) - Signed by Lloyd Huger, MD on 07/15/2017   Lloyd Huger, MD   12/16/2018 6:20  AM

## 2018-12-13 ENCOUNTER — Encounter: Payer: Medicare Other | Admitting: Occupational Therapy

## 2018-12-14 ENCOUNTER — Telehealth: Payer: Self-pay | Admitting: *Deleted

## 2018-12-14 ENCOUNTER — Inpatient Hospital Stay: Payer: Medicare Other

## 2018-12-14 ENCOUNTER — Other Ambulatory Visit: Payer: Self-pay

## 2018-12-14 ENCOUNTER — Encounter: Payer: Self-pay | Admitting: Surgery

## 2018-12-14 ENCOUNTER — Other Ambulatory Visit: Payer: Self-pay | Admitting: *Deleted

## 2018-12-14 ENCOUNTER — Inpatient Hospital Stay (HOSPITAL_BASED_OUTPATIENT_CLINIC_OR_DEPARTMENT_OTHER): Payer: Medicare Other | Admitting: Oncology

## 2018-12-14 ENCOUNTER — Inpatient Hospital Stay (HOSPITAL_BASED_OUTPATIENT_CLINIC_OR_DEPARTMENT_OTHER): Payer: Medicare Other | Admitting: Hospice and Palliative Medicine

## 2018-12-14 ENCOUNTER — Ambulatory Visit (INDEPENDENT_AMBULATORY_CARE_PROVIDER_SITE_OTHER): Payer: Medicare Other | Admitting: Surgery

## 2018-12-14 VITALS — BP 92/55 | HR 76 | Temp 98.5°F | Ht 72.0 in | Wt 158.6 lb

## 2018-12-14 VITALS — BP 89/50 | HR 73 | Temp 97.5°F | Resp 14 | Ht 72.0 in | Wt 162.0 lb

## 2018-12-14 DIAGNOSIS — Z800 Family history of malignant neoplasm of digestive organs: Secondary | ICD-10-CM

## 2018-12-14 DIAGNOSIS — R531 Weakness: Secondary | ICD-10-CM

## 2018-12-14 DIAGNOSIS — C439 Malignant melanoma of skin, unspecified: Secondary | ICD-10-CM

## 2018-12-14 DIAGNOSIS — G893 Neoplasm related pain (acute) (chronic): Secondary | ICD-10-CM

## 2018-12-14 DIAGNOSIS — Z9221 Personal history of antineoplastic chemotherapy: Secondary | ICD-10-CM

## 2018-12-14 DIAGNOSIS — R5383 Other fatigue: Secondary | ICD-10-CM

## 2018-12-14 DIAGNOSIS — R918 Other nonspecific abnormal finding of lung field: Secondary | ICD-10-CM | POA: Diagnosis not present

## 2018-12-14 DIAGNOSIS — C781 Secondary malignant neoplasm of mediastinum: Secondary | ICD-10-CM | POA: Diagnosis not present

## 2018-12-14 DIAGNOSIS — Z923 Personal history of irradiation: Secondary | ICD-10-CM

## 2018-12-14 DIAGNOSIS — C799 Secondary malignant neoplasm of unspecified site: Secondary | ICD-10-CM

## 2018-12-14 DIAGNOSIS — D649 Anemia, unspecified: Secondary | ICD-10-CM

## 2018-12-14 DIAGNOSIS — Z66 Do not resuscitate: Secondary | ICD-10-CM

## 2018-12-14 DIAGNOSIS — I890 Lymphedema, not elsewhere classified: Secondary | ICD-10-CM

## 2018-12-14 DIAGNOSIS — M129 Arthropathy, unspecified: Secondary | ICD-10-CM

## 2018-12-14 DIAGNOSIS — Z806 Family history of leukemia: Secondary | ICD-10-CM

## 2018-12-14 DIAGNOSIS — C7951 Secondary malignant neoplasm of bone: Secondary | ICD-10-CM | POA: Diagnosis not present

## 2018-12-14 DIAGNOSIS — Z79899 Other long term (current) drug therapy: Secondary | ICD-10-CM

## 2018-12-14 DIAGNOSIS — Z801 Family history of malignant neoplasm of trachea, bronchus and lung: Secondary | ICD-10-CM

## 2018-12-14 DIAGNOSIS — M79622 Pain in left upper arm: Secondary | ICD-10-CM

## 2018-12-14 DIAGNOSIS — Z515 Encounter for palliative care: Secondary | ICD-10-CM | POA: Diagnosis not present

## 2018-12-14 DIAGNOSIS — Z4889 Encounter for other specified surgical aftercare: Secondary | ICD-10-CM

## 2018-12-14 DIAGNOSIS — R630 Anorexia: Secondary | ICD-10-CM

## 2018-12-14 LAB — PREPARE RBC (CROSSMATCH)

## 2018-12-14 LAB — CBC WITH DIFFERENTIAL/PLATELET
Abs Immature Granulocytes: 0.13 10*3/uL — ABNORMAL HIGH (ref 0.00–0.07)
Basophils Absolute: 0 10*3/uL (ref 0.0–0.1)
Basophils Relative: 0 %
EOS PCT: 0 %
Eosinophils Absolute: 0 10*3/uL (ref 0.0–0.5)
HCT: 23.3 % — ABNORMAL LOW (ref 39.0–52.0)
HEMOGLOBIN: 7 g/dL — AB (ref 13.0–17.0)
Immature Granulocytes: 1 %
LYMPHS ABS: 0.7 10*3/uL (ref 0.7–4.0)
LYMPHS PCT: 6 %
MCH: 24.2 pg — ABNORMAL LOW (ref 26.0–34.0)
MCHC: 30 g/dL (ref 30.0–36.0)
MCV: 80.6 fL (ref 80.0–100.0)
Monocytes Absolute: 0.7 10*3/uL (ref 0.1–1.0)
Monocytes Relative: 6 %
NRBC: 0 % (ref 0.0–0.2)
Neutro Abs: 9.4 10*3/uL — ABNORMAL HIGH (ref 1.7–7.7)
Neutrophils Relative %: 87 %
Platelets: 402 10*3/uL — ABNORMAL HIGH (ref 150–400)
RBC: 2.89 MIL/uL — ABNORMAL LOW (ref 4.22–5.81)
RDW: 18.5 % — ABNORMAL HIGH (ref 11.5–15.5)
WBC: 10.9 10*3/uL — ABNORMAL HIGH (ref 4.0–10.5)

## 2018-12-14 LAB — COMPREHENSIVE METABOLIC PANEL
ALT: 10 U/L (ref 0–44)
AST: 11 U/L — ABNORMAL LOW (ref 15–41)
Albumin: 2 g/dL — ABNORMAL LOW (ref 3.5–5.0)
Alkaline Phosphatase: 38 U/L (ref 38–126)
Anion gap: 6 (ref 5–15)
BUN: 23 mg/dL (ref 8–23)
CALCIUM: 7.5 mg/dL — AB (ref 8.9–10.3)
CO2: 25 mmol/L (ref 22–32)
Chloride: 105 mmol/L (ref 98–111)
Creatinine, Ser: 0.94 mg/dL (ref 0.61–1.24)
Glucose, Bld: 104 mg/dL — ABNORMAL HIGH (ref 70–99)
Potassium: 3.8 mmol/L (ref 3.5–5.1)
Sodium: 136 mmol/L (ref 135–145)
Total Bilirubin: 0.3 mg/dL (ref 0.3–1.2)
Total Protein: 4.7 g/dL — ABNORMAL LOW (ref 6.5–8.1)

## 2018-12-14 LAB — PHOSPHORUS: Phosphorus: 3.9 mg/dL (ref 2.5–4.6)

## 2018-12-14 LAB — MAGNESIUM: Magnesium: 2.2 mg/dL (ref 1.7–2.4)

## 2018-12-14 MED ORDER — SODIUM CHLORIDE 0.9% IV SOLUTION
250.0000 mL | Freq: Once | INTRAVENOUS | Status: AC
  Administered 2018-12-14: 250 mL via INTRAVENOUS
  Filled 2018-12-14: qty 250

## 2018-12-14 MED ORDER — FENTANYL 25 MCG/HR TD PT72: 1.0000 | MEDICATED_PATCH | TRANSDERMAL | 0 refills | Status: DC

## 2018-12-14 MED ORDER — ACETAMINOPHEN 325 MG PO TABS
650.0000 mg | ORAL_TABLET | Freq: Once | ORAL | Status: AC
  Administered 2018-12-14: 650 mg via ORAL
  Filled 2018-12-14: qty 2

## 2018-12-14 MED ORDER — DIPHENHYDRAMINE HCL 50 MG/ML IJ SOLN
25.0000 mg | Freq: Once | INTRAMUSCULAR | Status: AC
  Administered 2018-12-14: 25 mg via INTRAVENOUS
  Filled 2018-12-14: qty 1

## 2018-12-14 MED ORDER — OXYCODONE HCL 10 MG PO TABS: 10.0000 mg | ORAL_TABLET | ORAL | 0 refills | Status: DC | PRN

## 2018-12-14 MED ORDER — OXYCODONE HCL 10 MG PO TABS: 10.0000 mg | ORAL_TABLET | Freq: Four times a day (QID) | ORAL | 0 refills | Status: DC | PRN

## 2018-12-14 NOTE — Telephone Encounter (Addendum)
Patient requesting we contact PT to resume his therapy ASAP

## 2018-12-14 NOTE — Telephone Encounter (Signed)
Ok, that's fine.

## 2018-12-14 NOTE — Telephone Encounter (Signed)
Done

## 2018-12-14 NOTE — Progress Notes (Signed)
Patient is here today to follow up on his metastatic melanoma. Patient had surgery on 12/06/2018. Patient stated that he is not feeling well.

## 2018-12-14 NOTE — Progress Notes (Signed)
Surgical Clinic Progress/Follow-up Note   HPI:  68 y.o. Male presents to clinic for post-op follow-up 8 Days s/p palliative re-excision of large necrotic Left axillary mass and sharp excisional debridement of Left axillary soft tissue/tumor necrosis Rosana Hoes, 12/06/2018). Patient reports moderate Left axillary pain, now controlled with a fentanyl patch. He otherwise continues to feel increasingly weak despite IV fluid resuscitation and PRBC transfusion today at cancer center. Patient and his wife remain nonetheless hopeful that his metastatic cancer with axillary progression not responsive to systemic chemotherapy despite minimized growth otherwise can be resected surgically by surgeon with whom patient has initial consultation tomorrow in [place]. He otherwise denies N/V, fever/chills, CP, or SOB.  Review of Systems:  Constitutional: denies fever/chills  Respiratory: denies shortness of breath, wheezing  Cardiovascular: denies chest pain, palpitations  Gastrointestinal: denies abdominal pain, N/V, or diarrhea Skin: Denies any other rashes or skin discolorations except Left axillary wound as per interval history  Vital Signs:  BP (!) 89/50   Pulse 73   Temp (!) 97.5 F (36.4 C) (Skin)   Resp 14   Ht 6' (1.829 m)   Wt 162 lb (73.5 kg)   BMI 21.97 kg/m    Physical Exam:  Constitutional:  -- Cachectic body habitus  -- Awake, alert, and oriented x3  Pulmonary:  -- No crackles -- Equal breath sounds bilaterally -- Breathing non-labored at rest Cardiovascular:  -- S1, S2 present  -- No pericardial rubs  Gastrointestinal:  -- Soft and non-distended, non-tender to palpation, no guarding/rebound tenderness -- No abdominal masses appreciated, pulsatile or otherwise  Musculoskeletal / Integumentary:  -- Wounds or skin discoloration: concave, moderately tender to palpation, friable, and oozing necrotic malodorous Left axillary mass, continuing its recurrence more peripherally around the  circumference of his wound with far less centrally and without any erythema or purulent drainage, small amount oozing/bleeding -- Extremities: B/L UE and LE FROM, hands and feet warm, unchanged LUE lymphedema with stocking in place   Assessment:  68 y.o. yo Male with a problem list including...  Patient Active Problem List   Diagnosis Date Noted  . Cancer associated pain 10-28-2018  . On antineoplastic chemotherapy 10-28-2018  . Localized tissue death ([place])   . Malignant melanoma of left upper extremity including shoulder ([place])   . Open wound of left axillary region with complication 40/98/1191  . Anemia 05/10/2018  . Ileus ([place])   . Constipation 10/10/2017  . Metastatic melanoma ([place]) 07/07/2017  . Axillary mass, left 06/26/2017  . Chronic pain of left knee 12/14/2015  . Influenza vaccination declined 11/27/2015    presents to clinic for post-op follow-up evaluation, doing okay, albeit with worsened weakness and persistent hypotension despite IV fluid resuscitation and PRBC transfusion today, along with overall poor prognosis in context of metastatic melanoma including Left axillary progression not responsive to systemic chemotherapy 8 Days s/p palliative re-excision of large bleeding necrotic and malodorous Left axillary mass and sharp excisional debridement of Left axillary soft tissue/tumor necrosis Rosana Hoes, 12/06/2018).  Plan:              - diet as tolerated              - pain control as needed             - medical therapy as per medical oncology team  - instructed to call office if any questions or concerns  - unclear whether any further role for surgery             -  return to clinic as needed  All of the above recommendations were discussed with the patient and his wife, and all of patient's and family's questions were answered to their expressed satisfaction.  -- Marilynne Drivers Rosana Hoes, MD, [place]: [facility] - Partnering for  exceptional care. Office: [phone]

## 2018-12-14 NOTE — Telephone Encounter (Signed)
Referral sent to Physical Therapy. They will contact the patient.

## 2018-12-14 NOTE — Progress Notes (Signed)
[place]  [phone] [phone]) [phone]   Name: Louis Davies Date: 12/14/2018 MRN: [id number]  DOB: [birth year]  Patient Care Team: Alvera Singh, FNP as PCP - General (Family Medicine)    REASON FOR CONSULTATION: Palliative Care consult requested for this 68 y.o. 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 He is also s/p XRT and chemotx. Previous PET scan results from September 2019 reveal improved disease.  Patient's care has been complicated by left extremity swelling managed by lymphedema clinic and left axilla wound managed at the wound center.  He also underwent recent surgical debridement of the wound. Palliative care was asked to help address goals of care.  SOCIAL HISTORY:    Patient lives alone.  He has two daughters and a son.  He works as a Games developer.  ADVANCE DIRECTIVES:  Has a living will and daughter is HCPOA.  CODE STATUS: DNR  PAST MEDICAL HISTORY: Past Medical History:  Diagnosis Date  . Anemia   . Arthritis   . Cancer ([place])    MELANOMA  . Complication of anesthesia   . Dyspnea    doe  . Perforated viscus   . PONV (postoperative nausea and vomiting)     PAST SURGICAL HISTORY:  Past Surgical History:  Procedure Laterality Date  . AXILLARY LYMPH NODE DISSECTION Left 06/26/2017   Procedure: EXCISION OF LEFT AXILLARY MASS;  Surgeon: Johnathan Hausen, MD;  Location: [facility];  Service: General;  Laterality: Left;  . AXILLARY LYMPH NODE DISSECTION Left 11/01/2018   Procedure: AXILLARY DEBRIDEMENT;  Surgeon: Vickie Epley, MD;  Location: [facility];  Service: General;  Laterality: Left;  . AXILLARY LYMPH NODE DISSECTION Left 12/06/2018   Procedure: PALLIATIVE DEBULKING OF AXILLARY TUMOR-LEFT;  Surgeon: Vickie Epley, MD;  Location: [facility];  Service: General;  Laterality: Left;  . DIAGNOSTIC LAPAROSCOPY    . EMBOLIZATION Left 05/11/2018   Procedure: EMBOLIZATION;  Surgeon: Katha Cabal, MD;  Location: [place] [facility];  Service: Cardiovascular;  Laterality: Left;  . LAPAROTOMY N/A 10/14/2017   Procedure: EXPLORATORY LAPAROTOMY;  Surgeon: Clayburn Pert, MD;  Location: [facility];  Service: General;  Laterality: N/A;  . VASECTOMY    . WOUND DEBRIDEMENT Left 09/24/2018   Procedure: LEFT AXILLARY EXCISIONAL DEBRIDEMENT OF NECROTIC TISSUE;  Surgeon: Vickie Epley, MD;  Location: [facility];  Service: General;  Laterality: Left;    HEMATOLOGY/ONCOLOGY HISTORY:  Oncology History   Patient initially  noticed an enlarging nodule in his left axilla approximately in July 2018. Subsequent excisional biopsy revealed metastatic melanoma. Patient had a lesion removed from his left back back in 2016 but otherwise has noted no other problems.  Had Pet Scan in Sept 2019 revealing metastatic disease. BRAF mutation is positive.    MRI of Brain revealed no metastatic disease.   Began with  immunotherapy using ipilumimab and nivolumab every 3 weeks for 4 cycles and then continue with maintenance nivomumab every 2 weeks until progression of disease.  - Ipilimimab & nivolumab complicated by small intestine perforation. Immunotherapy discontinued 10/2017.   Started on 960 mg Zelboraf (10/2017) daily. BRAF mutation is positive.  Dose reduced to 740m BID in March 2019 d/t side effects including nausea, vomiting, weight loss and abdominal bloating.   [place]  Restarted at reduced dose of 480 mg in May 2019.  Had restaging PET scan may 2019 revealed esponse to therapy, as evidenced by decreased size and hypermetabolism of dominant left axillary mass. Other thoracic nodes were improved and resolved in hypermetabolism as detailed above. 2. Decreased  hypermetabolism within indeterminate left iliac lesion  On 04/26/18 patient noted enlarging axillary mass and was concerned. CT chest ordered revelaing a large left axillary mass that had dramatically increased in size, measuring over 5 times the volume that it did on 03/04/2018. Appearance compatible with rapid growth of malignancy.  - 05/2018 s/p XRT to left axillary mass - 06/2018 PET CT revealing dominant left axillary mass with small ulcerative lesion within the left posterior chest and 2 cm lucent lesion of the left iliac bone -11/29 evaluation for chronic painful left axillary wound and thought to be 2/2 tumor necrosis was treated with Keflex and supportive wound care with no improvement 09/24/2018-debridement and excision of large necrotic mass followed by rapid enlargement of tumor 10/16/2018 presented to [facility] for management of left axillary mass and associated pain/infection.  CT chest revealing postsurgical changes in left axilla with heterogeneous density with superimposed infection.  No extension in the thoracic cavity.  Stable small bilateral pulmonary nodules measuring up to 4 mm several of which are calcified suggestive of prior granulomatosis disease -10/25/2017 evaluated by Dr. Salalma - Initiated encorafenib and binimetinib on 11/01/2018     Metastatic melanoma ([facility])   07/07/2017 Initial Diagnosis    Metastatic melanoma ([facility])     ALLERGIES:  has No Known Allergies.  MEDICATIONS:  Current Outpatient Medications  Medication Sig Dispense Refill  . Binimetinib (MEKTOVI) 15 MG TABS Take 45 mg by mouth 2 (two) times daily. (Patient taking differently: Take 30 mg by mouth 2 (two) times daily. ) 180 tablet 2  . Encorafenib 75 MG CAPS Take 450 mg by mouth daily. (Patient taking differently: Take 300 mg by mouth daily. ) 180 capsule 2  . fentaNYL (DURAGESIC) 25 MCG/HR Place 1 patch onto the skin every 3 (three) days. 10 patch 0  . ibuprofen (ADVIL,MOTRIN) 200 MG tablet Take 200 mg by  mouth every 6 (six) hours as needed for moderate pain.     . Multiple Vitamin (MULTIVITAMIN) tablet Take 1 tablet by mouth daily.    . Oxycodone HCl 10 MG TABS Take 1 tablet (10 mg total) by mouth every 4 (four) hours as needed. 60 tablet 0  . prednisoLONE acetate (PRED FORTE) 1 % ophthalmic suspension Place 1 drop into both eyes 4 (four) times daily. 5 mL 0   No current facility-administered medications for this visit.     VITAL SIGNS: There were no vitals taken for this visit. There were no vitals filed for this visit.  Estimated body mass index is 21.51 kg/m as calculated from the following:   Height as of an earlier encounter on 12/14/18: 6' (1.829 m).   Weight as of an earlier encounter on 12/14/18: 158 lb 9.6 oz (71.9 kg).  LABS: CBC:    Component Value Date/Time   WBC 10.9 (H) 12/14/2018 0812   HGB 7.0 (L) 12/14/2018 0812   HCT 23.3 (L) 12/14/2018 0812   PLT 402 (H) 12/14/2018 0812   MCV 80.6 12/14/2018 0812   NEUTROABS 9.4 (H) 12/14/2018 0812   LYMPHSABS 0.7 12/14/2018 0812   MONOABS 0.7 12/14/2018 0812   EOSABS 0.0 12/14/2018 0812   BASOSABS 0.0 12/14/2018 0812   Comprehensive Metabolic Panel:      Component Value Date/Time   NA 136 12/14/2018 0812   K 3.8 12/14/2018 0812   CL 105 12/14/2018 0812   CO2 25 12/14/2018 0812   BUN 23 12/14/2018 0812   CREATININE 0.94 12/14/2018 0812   GLUCOSE 104 (H) 12/14/2018 0812   CALCIUM 7.5 (L) 12/14/2018 0812   AST 11 (L) 12/14/2018 0812   ALT 10 12/14/2018 0812   ALKPHOS 38 12/14/2018 0812   BILITOT 0.3 12/14/2018 0812   PROT 4.7 (L) 12/14/2018 0812   ALBUMIN 2.0 (L) 12/14/2018 5784    RADIOGRAPHIC STUDIES: No results found.  PERFORMANCE STATUS (ECOG) : 1 - Symptomatic but completely ambulatory  Review of Systems As noted above. Otherwise, a complete review of systems is negative.  Physical Exam General: NAD, frail appearing, thin Cardiovascular: regular rate and rhythm Pulmonary: Unlabored, CTA ant  fields Extremities: no edema, no joint deformities Skin: wound noted but not visualized Neurological: Weakness but otherwise nonfocal  IMPRESSION: Patient was seen today in the clinic for routine follow-up. He was receiving blood transfusion in the infusion area.   Patient says his pain has been markedly worse since the most recent surgical debridement on 2/17. Patient has been taking the oxycodone every 6 hours and does find some relief but it is short-lived. Will increase frequency to Q4H PRN. Patient was started on transdermal fentanyl by Dr. Grayland Ormond.   He is being referred to [facility] to see a melanoma surgical specialist tomorrow. Patient is hopeful but seems to recognize that he may have few alternative treatment options left.   Emotional support provided.    PLAN: -Continue current scope of treatment -Increase oxycodone 45m Q4H PRN -Patient started on transdermal fentanyl 235m Q72H per Dr. FiGrayland OrmondProphylactic bowel regimen -RTC in 2-3 weeks   Patient expressed understanding and was in agreement with this plan. He also understands that He can call clinic at any time with any questions, concerns, or complaints.    Time Total: 15 minutes  Visit consisted of counseling and education dealing with the complex and emotionally intense issues of symptom management and palliative care in the setting of serious and potentially life-threatening illness.Greater than 50%  of this time was spent counseling and coordinating care related to the above assessment and plan.  Signed by: JoAltha HarmPhD, DNP, NP-C, [facility]ol[phone] Cell)

## 2018-12-15 DIAGNOSIS — C439 Malignant melanoma of skin, unspecified: Secondary | ICD-10-CM | POA: Diagnosis not present

## 2018-12-15 DIAGNOSIS — Z6822 Body mass index (BMI) 22.0-22.9, adult: Secondary | ICD-10-CM | POA: Diagnosis not present

## 2018-12-15 LAB — BPAM RBC
Blood Product Expiration Date: 202003142359
ISSUE DATE / TIME: 202002251133
Unit Type and Rh: 1700

## 2018-12-15 LAB — TYPE AND SCREEN
ABO/RH(D): B NEG
Antibody Screen: NEGATIVE
Unit division: 0

## 2018-12-16 ENCOUNTER — Encounter: Payer: Self-pay | Admitting: Occupational Therapy

## 2018-12-16 ENCOUNTER — Encounter: Payer: Medicare Other | Admitting: Occupational Therapy

## 2018-12-16 ENCOUNTER — Ambulatory Visit: Payer: Medicare Other | Admitting: Occupational Therapy

## 2018-12-16 DIAGNOSIS — I890 Lymphedema, not elsewhere classified: Secondary | ICD-10-CM

## 2018-12-16 NOTE — Therapy (Signed)
[place] MAIN [facility] SERVICES [address] [place], [place], [address] Phone: [phone]   Fax:  [phone]  Occupational Therapy Treatment  Patient Details  Name: Louis Davies MRN: [id number] Date of Birth: 02-Jan-1951 No data recorded  Encounter Date: 12/16/2018  OT End of Session - 12/16/18 1428    Visit Number  9    Number of Visits  15    Date for OT Re-Evaluation  01/02/19    Authorization Type  visit 8/48for 2020;  36th visits total    Authorization Time Period  cont OT 2-3 x weekly x 12 weeks/ 36 visits to address LUE/LUQ lymphedema 2/38m metastatic melanoma    Authorization - Number of Visits  36    OT Start Time  1100    OT Stop Time  1210    OT Time Calculation (min)  70 min    Activity Tolerance  Patient tolerated treatment well;Patient limited by pain    Behavior During Therapy  [facility] for tasks assessed/performed       Past Medical History:  Diagnosis Date  . Anemia   . Arthritis   . Cancer ([place])    MELANOMA  . Complication of anesthesia   . Dyspnea    doe  . Perforated viscus   . PONV (postoperative nausea and vomiting)     Past Surgical History:  Procedure Laterality Date  . AXILLARY LYMPH NODE DISSECTION Left 06/26/2017   Procedure: EXCISION OF LEFT AXILLARY MASS;  Surgeon: Louis Hausen, MD;  Location: [facility] ORS;  Service: General;  Laterality: Left;  . AXILLARY LYMPH NODE DISSECTION Left 11/01/2018   Procedure: AXILLARY DEBRIDEMENT;  Surgeon: Louis Epley, MD;  Location: [facility] ORS;  Service: General;  Laterality: Left;  . AXILLARY LYMPH NODE DISSECTION Left 12/06/2018   Procedure: PALLIATIVE DEBULKING OF AXILLARY TUMOR-LEFT;  Surgeon: Louis Epley, MD;  Location: [facility] ORS;  Service: General;  Laterality: Left;  . DIAGNOSTIC LAPAROSCOPY    . EMBOLIZATION Left 05/11/2018   Procedure: EMBOLIZATION;  Surgeon: Louis Cabal, MD;  Location: [place] CV LAB;  Service: Cardiovascular;  Laterality:  Left;  . LAPAROTOMY N/A 10/14/2017   Procedure: EXPLORATORY LAPAROTOMY;  Surgeon: Louis Pert, MD;  Location: [facility] ORS;  Service: General;  Laterality: N/A;  . VASECTOMY    . WOUND DEBRIDEMENT Left 09/24/2018   Procedure: LEFT AXILLARY EXCISIONAL DEBRIDEMENT OF NECROTIC TISSUE;  Surgeon: Louis Epley, MD;  Location: [facility] ORS;  Service: General;  Laterality: Left;    There were no vitals filed for this visit.  Subjective Assessment - 12/16/18 1311    Subjective   Louis Davies presents to resume OT treatment visit 8/36 for 2020 (37 total) to address LUE/ LUQ lymphedema management of LUE/LUQ 2/2 metastatic melanoma.  Pt presents withoutcustom compression sleeve in place. Pt was last seen on 11/25/18 for lymphedema care. He returns today after 3rd L axillary palliative debulking/ debriedment. He reoprts he consulted with Louis Davies at [facility] yesterday and an MRI is pending.     Patient is accompained by:  --   significant other trqansported Pt to clinic. She does not attebnd session.   Limitations   LUE/ LUQ pain, swelling and odor from wound contribute to impaired basic and instrumental ADLs, limited participation in leisure pursuits and  work/ productive activities, impaired social participation, impaired role performance, impaired body image     Currently in Pain?  Yes   Pain not rated. Pt reports new Fentnyl patch applied  last night not managing pain level   Pain Location  Axilla    Pain Orientation  Left    Pain Descriptors / Indicators  Aching;Guarding;Tender;Pressure;Sore;Throbbing;Tightness;Heaviness;Discomfort;Constant;Jabbing;Penetrating;Squeezing;Tiring;Stabbing    Pain Type  Chronic pain    Pain Radiating Towards  LUE    Pain Onset  Other (comment)   post surgical   Pain Frequency  Constant    Aggravating Factors   rpositioning, gravity dependent positioning    Effect of Pain on Daily Activities  impairs functional performance in all occupational domians, sleep/ rest, Basic and  instrumental ADLs, productive /work activities, leisure pursuits, social participation, role performance    Multiple Pain Sites  Yes                   OT Treatments/Exercises (OP) - 12/16/18 0001      ADLs   ADL Comments  --    ADL Education Given  Yes      Manual Therapy   Manual Therapy  Edema management;Manual Lymphatic Drainage (MLD)    Edema Management  Mod  A to don open front tshirt and button down shirt. Mod A to position LUE for comfortable support on Rx bed.    Manual Lymphatic Drainage (MLD)  MLD to LUE/LUQ seated in in supine with shoulder supported in neutra. Alternative pathways used today included AAA and AI. Emphasis of manual therapy on L arm . Pt reports pain reduction and relaxation bordering on sleep at end of sessionl    Compression Bandaging  sleeve not available after session.             OT Education - 12/16/18 1422    Education Details  Pt edu re optimal pain control to ensure optimal functional performance and participation in most meaningful daily activities. Pt encouraged to report poor pasin control; to oncvology providers ASAP.  Bried Pt edu re energy conservation. Discussed concept of reserving energy for most meaningful activities vs expending energy on less meaningful activities. Suggested using volunteer transport by volunteer services  to get from door to [place] clinic vs walking the whole way and feeling worn out and terrible when arriving and then leaving, thus undoing relaxation and pain relief benefits of Rx.  Pt verbalized understanding    Person(s) Educated  Patient          OT Long Term Goals - 11/10/18 1049      OT LONG TERM GOAL #1   Title  Caregiver will be able to provide max A to apply compression wraps using light, gradient compression technique to control swelling when / if donning sleeve is no longer tolerated.     Baseline  Dependent    Time  6    Period  Days    Status  New    Target Date  12/10/18      OT LONG  TERM GOAL #2   Title  Pt will demonstrate ability to perform modified simple self MLD to terminus at neck and clavicles, and LUE for optimal independence with this component of LE self-care.     Baseline  mod A    Time  3    Period  Days    Status  New    Target Date  12/01/18      OT LONG TERM GOAL #3   Title  Pt to achieve no less than 10% limb volume reduction in affected limb(s) during Intensive Phase CDT to control limb swelling, to improve tissue integrity and immune function, to improve  ADLs performance and to improve functional mobility/ transfers.    Baseline  dependent    Time  12    Period  Weeks    Status  Deferred      OT LONG TERM GOAL #4   Title  Pt will achieve 100% compliance with daily LE self-care home program components, including proper skin care, simple self-MLD, gradient compression wraps/ garments, and therapeutic exercise to ensure optimal Intensive Phase limb volume reduction to expedite compression garment/ device fitting.    Baseline  Max A    Time  12    Period  Weeks    Status  Deferred      OT LONG TERM GOAL #5   Title  Pt will demonstrate competent use of assistive devices during LE self-care training to achieve pain free dressing, bathing and grooming .    Baseline  Max A    Time  12    Period  Weeks    Status  Deferred      OT LONG TERM GOAL #6   Title  Pt will retain optimal limb volume reductions achieved during Intensive Phase CDT with no more than 15% volume increase  without CG assistance to limit LE progression and further functional decline.    Baseline  Dependent    Time  3    Period  Months    Status  Revised            Plan - 12/16/18 1429    Clinical Impression Statement  Mr Orourke presents with more apparent cachetic body habitus today compared with when last seen. He is fatigued from walking to clinic and he is shivering c/o feeling very cold.     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    OT Frequency  3x / week    OT Duration  Other (comment)   ongoing as palliative measure 2/2 advanced cancer and worsening LE w/ functional decline   OT Treatment/Interventions  Self-care/ADL training;Therapeutic exercise;Manual lymph drainage;Compression bandaging;Patient/family education;Other (comment);Energy conservation;Therapeutic activities;Balance training;DME and/or AE instruction;Manual Therapy;Passive range of motion;Coping strategies training   Assist Pt with modification of TShirts with velcro cosures at shoulder and side seams to reduce pain     when dressing. Provide additional assistive device resources PRN.   Clinical Decision Making  Multiple treatment options, significant modification of task necessary    Recommended Other Services  fit with custom, clat knit LUE compression sleeve and HOS device. Consider seamless compression glove and vest. Consider kinesiotape during Intensive Phase    Consulted and Agree with Plan of Care  Patient       Patient will benefit from skilled therapeutic intervention in order to improve the following deficits and impairments:  Decreased endurance, Decreased mobility, Decreased skin integrity, Impaired sensation, Decreased knowledge of precautions, Decreased range of motion, Decreased scar mobility, Increased edema, Pain, Decreased activity tolerance, Decreased knowledge of use of DME, Decreased strength, Impaired flexibility, Impaired UE functional use, Decreased psychosocial skills, Decreased  balance, Other (comment)  Visit Diagnosis: Lymphedema, not elsewhere classified    Problem List Patient Active  Problem List   Diagnosis Date Noted  . 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 On antineoplastic chemotherapy 2018-10-27  . 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 Open wound of left axillary region with complication 66/44/0347  . Anemia 05/10/2018  . Ileus ([place])   . Constipation 10/10/2017  . Metastatic melanoma ([place]) 07/07/2017  . Axillary mass, left 06/26/2017  . Chronic pain of left knee 12/14/2015  . Influenza vaccination declined 11/27/2015    Andrey Spearman, MS, OTR/L, [facility] 12/16/18 2:34 PM  [place] MAIN [facility] SERVICES [address] [place], [place], [address] Phone: [phone]   Fax:  [phone]  Name: Louis Davies MRN: [id number] Date of Birth: 05/12/1951

## 2018-12-20 ENCOUNTER — Telehealth: Payer: Self-pay | Admitting: *Deleted

## 2018-12-20 NOTE — Telephone Encounter (Signed)
Louis Davies called reporting that patient is having dry heaves with the new medicine and is still having pain even taking Oxycodone and ibuprofen and tylenol between. Asking what else can be done for him. Please advise

## 2018-12-20 NOTE — Telephone Encounter (Signed)
I tried calling the patient but did not reach him. Message left.

## 2018-12-20 NOTE — Telephone Encounter (Signed)
Please call the daughter whose number is attached to this message if you cannot reach him

## 2018-12-20 NOTE — Telephone Encounter (Signed)
Fentanyl patch?  He'll have to remove the patch and we can start him on another long acting narcotic like MS Contin 85mh q12hrs.

## 2018-12-21 ENCOUNTER — Encounter: Payer: Medicare Other | Admitting: Occupational Therapy

## 2018-12-21 ENCOUNTER — Other Ambulatory Visit: Payer: Self-pay | Admitting: Hospice and Palliative Medicine

## 2018-12-21 MED ORDER — ONDANSETRON HCL 8 MG PO TABS: 8.0000 mg | ORAL_TABLET | Freq: Three times a day (TID) | ORAL | 0 refills | Status: AC | PRN

## 2018-12-21 NOTE — Telephone Encounter (Signed)
Called and spoke with patient's daughter. Pain has been persistent. However, patient/family have been trying to limit pain medications. I explained that oxycodone can be taken every 4 hours as needed. We can also increase dose of fentanyl or rotate to another LAO. Daughter verbalized agreement with increasing frequency of oxycodone.   Patient has had some nausea. Apparently he has stopped taking the binimetinib. Will send Rx for zofran.   Patient saw melanoma surgeon at [facility] and has f/u on 3/11. Patient is considering full amputation of LUE.   I offered a clinic visit for symptoms and daughter will let us know.

## 2018-12-22 ENCOUNTER — Inpatient Hospital Stay
Admission: EM | Admit: 2018-12-22 | Discharge: 2018-12-25 | DRG: 871 | Disposition: A | Discharge status: Home Health | Payer: Medicare Other | Attending: Specialist | Admitting: Specialist

## 2018-12-22 ENCOUNTER — Encounter: Payer: Medicare Other | Admitting: Occupational Therapy

## 2018-12-22 ENCOUNTER — Emergency Department: Payer: Medicare Other

## 2018-12-22 ENCOUNTER — Other Ambulatory Visit: Payer: Self-pay

## 2018-12-22 DIAGNOSIS — D500 Iron deficiency anemia secondary to blood loss (chronic): Secondary | ICD-10-CM | POA: Diagnosis present

## 2018-12-22 DIAGNOSIS — D649 Anemia, unspecified: Secondary | ICD-10-CM | POA: Diagnosis not present

## 2018-12-22 DIAGNOSIS — Z8249 Family history of ischemic heart disease and other diseases of the circulatory system: Secondary | ICD-10-CM

## 2018-12-22 DIAGNOSIS — E161 Other hypoglycemia: Secondary | ICD-10-CM | POA: Diagnosis not present

## 2018-12-22 DIAGNOSIS — Z8349 Family history of other endocrine, nutritional and metabolic diseases: Secondary | ICD-10-CM

## 2018-12-22 DIAGNOSIS — E876 Hypokalemia: Secondary | ICD-10-CM | POA: Diagnosis present

## 2018-12-22 DIAGNOSIS — C799 Secondary malignant neoplasm of unspecified site: Secondary | ICD-10-CM | POA: Diagnosis present

## 2018-12-22 DIAGNOSIS — W19XXXA Unspecified fall, initial encounter: Secondary | ICD-10-CM | POA: Diagnosis not present

## 2018-12-22 DIAGNOSIS — D638 Anemia in other chronic diseases classified elsewhere: Secondary | ICD-10-CM | POA: Diagnosis present

## 2018-12-22 DIAGNOSIS — E871 Hypo-osmolality and hyponatremia: Secondary | ICD-10-CM | POA: Diagnosis present

## 2018-12-22 DIAGNOSIS — Z6821 Body mass index (BMI) 21.0-21.9, adult: Secondary | ICD-10-CM

## 2018-12-22 DIAGNOSIS — K5900 Constipation, unspecified: Secondary | ICD-10-CM | POA: Diagnosis not present

## 2018-12-22 DIAGNOSIS — R000 Tachycardia, unspecified: Secondary | ICD-10-CM | POA: Diagnosis not present

## 2018-12-22 DIAGNOSIS — G893 Neoplasm related pain (acute) (chronic): Secondary | ICD-10-CM | POA: Diagnosis present

## 2018-12-22 DIAGNOSIS — Z8042 Family history of malignant neoplasm of prostate: Secondary | ICD-10-CM | POA: Diagnosis not present

## 2018-12-22 DIAGNOSIS — R571 Hypovolemic shock: Secondary | ICD-10-CM | POA: Diagnosis present

## 2018-12-22 DIAGNOSIS — J90 Pleural effusion, not elsewhere classified: Secondary | ICD-10-CM | POA: Diagnosis not present

## 2018-12-22 DIAGNOSIS — E46 Unspecified protein-calorie malnutrition: Secondary | ICD-10-CM | POA: Diagnosis present

## 2018-12-22 DIAGNOSIS — Z806 Family history of leukemia: Secondary | ICD-10-CM | POA: Diagnosis not present

## 2018-12-22 DIAGNOSIS — Z833 Family history of diabetes mellitus: Secondary | ICD-10-CM

## 2018-12-22 DIAGNOSIS — C439 Malignant melanoma of skin, unspecified: Secondary | ICD-10-CM | POA: Diagnosis not present

## 2018-12-22 DIAGNOSIS — A419 Sepsis, unspecified organism: Secondary | ICD-10-CM | POA: Diagnosis not present

## 2018-12-22 DIAGNOSIS — Z801 Family history of malignant neoplasm of trachea, bronchus and lung: Secondary | ICD-10-CM | POA: Diagnosis not present

## 2018-12-22 DIAGNOSIS — M1990 Unspecified osteoarthritis, unspecified site: Secondary | ICD-10-CM | POA: Diagnosis present

## 2018-12-22 DIAGNOSIS — E162 Hypoglycemia, unspecified: Secondary | ICD-10-CM | POA: Diagnosis not present

## 2018-12-22 DIAGNOSIS — R55 Syncope and collapse: Secondary | ICD-10-CM | POA: Diagnosis not present

## 2018-12-22 DIAGNOSIS — D72829 Elevated white blood cell count, unspecified: Secondary | ICD-10-CM | POA: Diagnosis not present

## 2018-12-22 DIAGNOSIS — I959 Hypotension, unspecified: Secondary | ICD-10-CM | POA: Diagnosis not present

## 2018-12-22 LAB — COMPREHENSIVE METABOLIC PANEL
ALT: 19 U/L (ref 0–44)
ANION GAP: 11 (ref 5–15)
AST: 23 U/L (ref 15–41)
Albumin: 1.6 g/dL — ABNORMAL LOW (ref 3.5–5.0)
Alkaline Phosphatase: 63 U/L (ref 38–126)
BUN: 22 mg/dL (ref 8–23)
CHLORIDE: 99 mmol/L (ref 98–111)
CO2: 22 mmol/L (ref 22–32)
Calcium: 7.1 mg/dL — ABNORMAL LOW (ref 8.9–10.3)
Creatinine, Ser: 0.87 mg/dL (ref 0.61–1.24)
GFR calc Af Amer: >60 mL/min (ref >60)
GFR calc non Af Amer: >60 mL/min (ref >60)
Glucose, Bld: 111 mg/dL — ABNORMAL HIGH (ref 70–99)
POTASSIUM: 4.1 mmol/L (ref 3.5–5.1)
Sodium: 132 mmol/L — ABNORMAL LOW (ref 135–145)
Total Bilirubin: 0.3 mg/dL (ref 0.3–1.2)
Total Protein: 4.3 g/dL — ABNORMAL LOW (ref 6.5–8.1)

## 2018-12-22 LAB — LIPASE, BLOOD: Lipase: 25 U/L (ref 11–51)

## 2018-12-22 LAB — CBC WITH DIFFERENTIAL/PLATELET
Abs Immature Granulocytes: 0.2 10*3/uL — ABNORMAL HIGH (ref 0.00–0.07)
BASOS ABS: 0 10*3/uL (ref 0.0–0.1)
Basophils Relative: 0 %
Eosinophils Absolute: 0 10*3/uL (ref 0.0–0.5)
Eosinophils Relative: 0 %
HCT: 17.8 % — ABNORMAL LOW (ref 39.0–52.0)
Hemoglobin: 5.4 g/dL — ABNORMAL LOW (ref 13.0–17.0)
Immature Granulocytes: 1 %
Lymphocytes Relative: 2 %
Lymphs Abs: 0.5 10*3/uL — ABNORMAL LOW (ref 0.7–4.0)
MCH: 23.6 pg — ABNORMAL LOW (ref 26.0–34.0)
MCHC: 30.3 g/dL (ref 30.0–36.0)
MCV: 77.7 fL — ABNORMAL LOW (ref 80.0–100.0)
Monocytes Absolute: 1 10*3/uL (ref 0.1–1.0)
Monocytes Relative: 5 %
NEUTROS ABS: 20.2 10*3/uL — AB (ref 1.7–7.7)
Neutrophils Relative %: 92 %
Platelets: 358 10*3/uL (ref 150–400)
RBC: 2.29 MIL/uL — ABNORMAL LOW (ref 4.22–5.81)
RDW: 18.7 % — ABNORMAL HIGH (ref 11.5–15.5)
WBC: 22 10*3/uL — ABNORMAL HIGH (ref 4.0–10.5)
nRBC: 0 % (ref 0.0–0.2)

## 2018-12-22 LAB — URINALYSIS, COMPLETE (UACMP) WITH MICROSCOPIC
Bacteria, UA: NONE SEEN
Bilirubin Urine: NEGATIVE
Glucose, UA: NEGATIVE mg/dL
Hgb urine dipstick: NEGATIVE
Ketones, ur: NEGATIVE mg/dL
Leukocytes,Ua: NEGATIVE
Nitrite: NEGATIVE
PH: 5 (ref 5.0–8.0)
Protein, ur: NEGATIVE mg/dL
Specific Gravity, Urine: 1.018 (ref 1.005–1.030)

## 2018-12-22 LAB — LACTIC ACID, PLASMA
Lactic Acid, Venous: 2.1 mmol/L (ref 0.5–1.9)
Lactic Acid, Venous: 0.8 mmol/L (ref 0.5–1.9)

## 2018-12-22 LAB — PROCALCITONIN: Procalcitonin: 0.21 ng/mL

## 2018-12-22 MED ORDER — SODIUM CHLORIDE 0.9 % IV SOLN
2.0000 g | Freq: Three times a day (TID) | INTRAVENOUS | Status: DC
  Administered 2018-12-22 – 2018-12-25 (×8): 2 g via INTRAVENOUS
  Filled 2018-12-22 (×12): qty 2

## 2018-12-22 MED ORDER — BINIMETINIB 15 MG PO TABS
15.0000 mg | ORAL_TABLET | Freq: Two times a day (BID) | ORAL | Status: DC
  Administered 2018-12-23 – 2018-12-25 (×4): 15 mg via ORAL
  Filled 2018-12-22 (×4): qty 180

## 2018-12-22 MED ORDER — POLYETHYLENE GLYCOL 3350 17 G PO PACK
17.0000 g | PACK | Freq: Every day | ORAL | Status: DC | PRN
  Administered 2018-12-24 – 2018-12-25 (×2): 17 g via ORAL
  Filled 2018-12-22 (×2): qty 1

## 2018-12-22 MED ORDER — MORPHINE SULFATE (PF) 2 MG/ML IV SOLN
2.0000 mg | INTRAVENOUS | Status: DC | PRN
  Filled 2018-12-22: qty 1

## 2018-12-22 MED ORDER — ENOXAPARIN SODIUM 40 MG/0.4ML ~~LOC~~ SOLN
40.0000 mg | SUBCUTANEOUS | Status: DC
  Filled 2018-12-22: qty 0.4

## 2018-12-22 MED ORDER — IOHEXOL 350 MG/ML SOLN
75.0000 mL | Freq: Once | INTRAVENOUS | Status: AC | PRN
  Administered 2018-12-22: 75 mL via INTRAVENOUS

## 2018-12-22 MED ORDER — VANCOMYCIN HCL IN DEXTROSE 1-5 GM/200ML-% IV SOLN
1000.0000 mg | Freq: Once | INTRAVENOUS | Status: DC
  Filled 2018-12-22: qty 200

## 2018-12-22 MED ORDER — METRONIDAZOLE IN NACL 5-0.79 MG/ML-% IV SOLN
500.0000 mg | Freq: Three times a day (TID) | INTRAVENOUS | Status: DC
  Administered 2018-12-22 – 2018-12-25 (×9): 500 mg via INTRAVENOUS
  Filled 2018-12-22 (×14): qty 100

## 2018-12-22 MED ORDER — ALBUTEROL SULFATE (2.5 MG/3ML) 0.083% IN NEBU: 2.5000 mg | INHALATION_SOLUTION | Freq: Four times a day (QID) | RESPIRATORY_TRACT | Status: DC | PRN

## 2018-12-22 MED ORDER — VANCOMYCIN HCL 10 G IV SOLR
1500.0000 mg | Freq: Once | INTRAVENOUS | Status: AC
  Administered 2018-12-22: 1500 mg via INTRAVENOUS
  Filled 2018-12-22: qty 1500

## 2018-12-22 MED ORDER — SODIUM CHLORIDE 0.9 % IV SOLN
10.0000 mL/h | Freq: Once | INTRAVENOUS | Status: AC
  Administered 2018-12-22: 10 mL/h via INTRAVENOUS

## 2018-12-22 MED ORDER — IBUPROFEN 400 MG PO TABS
200.0000 mg | ORAL_TABLET | Freq: Four times a day (QID) | ORAL | Status: DC | PRN
  Administered 2018-12-23 – 2018-12-24 (×2): 200 mg via ORAL
  Filled 2018-12-22 (×2): qty 1

## 2018-12-22 MED ORDER — SODIUM CHLORIDE 0.9 % IV SOLN
2.0000 g | Freq: Once | INTRAVENOUS | Status: AC
  Administered 2018-12-22: 2 g via INTRAVENOUS
  Filled 2018-12-22: qty 2

## 2018-12-22 MED ORDER — VANCOMYCIN HCL IN DEXTROSE 1-5 GM/200ML-% IV SOLN
1000.0000 mg | Freq: Two times a day (BID) | INTRAVENOUS | Status: DC
  Administered 2018-12-23 – 2018-12-25 (×5): 1000 mg via INTRAVENOUS
  Filled 2018-12-22 (×9): qty 200

## 2018-12-22 MED ORDER — DEXTROSE-NACL 5-0.9 % IV SOLN
INTRAVENOUS | Status: DC
  Administered 2018-12-23 (×2): via INTRAVENOUS

## 2018-12-22 MED ORDER — ALBUTEROL SULFATE (2.5 MG/3ML) 0.083% IN NEBU
2.5000 mg | INHALATION_SOLUTION | Freq: Four times a day (QID) | RESPIRATORY_TRACT | Status: DC
  Filled 2018-12-22: qty 3

## 2018-12-22 MED ORDER — PROMETHAZINE HCL 25 MG PO TABS
12.5000 mg | ORAL_TABLET | Freq: Four times a day (QID) | ORAL | Status: DC | PRN
  Filled 2018-12-22: qty 1

## 2018-12-22 MED ORDER — FENTANYL 25 MCG/HR TD PT72
1.0000 | MEDICATED_PATCH | TRANSDERMAL | Status: DC
  Administered 2018-12-24: 1 via TRANSDERMAL
  Filled 2018-12-22: qty 1

## 2018-12-22 MED ORDER — ENCORAFENIB 75 MG PO CAPS
225.0000 mg | ORAL_CAPSULE | Freq: Every day | ORAL | Status: DC
  Administered 2018-12-24 – 2018-12-25 (×2): 225 mg via ORAL
  Filled 2018-12-22 (×2): qty 90

## 2018-12-22 MED ORDER — SODIUM CHLORIDE 0.9 % IV SOLN: 2.0000 g | Freq: Once | INTRAVENOUS | Status: DC

## 2018-12-22 MED ORDER — SODIUM CHLORIDE 0.9 % IV SOLN
INTRAVENOUS | Status: DC | PRN
  Administered 2018-12-22: 500 mL via INTRAVENOUS
  Administered 2018-12-22 – 2018-12-23 (×3): 1000 mL via INTRAVENOUS
  Administered 2018-12-23 – 2018-12-24 (×2): 500 mL via INTRAVENOUS

## 2018-12-22 MED ORDER — ADULT MULTIVITAMIN W/MINERALS CH
1.0000 | ORAL_TABLET | Freq: Every day | ORAL | Status: DC
  Administered 2018-12-24 – 2018-12-25 (×2): 1 via ORAL
  Filled 2018-12-22 (×3): qty 1

## 2018-12-22 MED ORDER — VANCOMYCIN HCL IN DEXTROSE 1-5 GM/200ML-% IV SOLN: 1000.0000 mg | Freq: Once | INTRAVENOUS | Status: DC

## 2018-12-22 MED ORDER — DOCUSATE SODIUM 100 MG PO CAPS
100.0000 mg | ORAL_CAPSULE | Freq: Two times a day (BID) | ORAL | Status: DC
  Administered 2018-12-22 – 2018-12-25 (×5): 100 mg via ORAL
  Filled 2018-12-22 (×6): qty 1

## 2018-12-22 MED ORDER — METRONIDAZOLE IN NACL 5-0.79 MG/ML-% IV SOLN
500.0000 mg | Freq: Three times a day (TID) | INTRAVENOUS | Status: DC
  Filled 2018-12-22 (×2): qty 100

## 2018-12-22 MED ORDER — ONDANSETRON HCL 4 MG PO TABS
8.0000 mg | ORAL_TABLET | Freq: Three times a day (TID) | ORAL | Status: DC | PRN
  Administered 2018-12-23 – 2018-12-25 (×2): 8 mg via ORAL
  Filled 2018-12-22 (×2): qty 2

## 2018-12-22 MED ORDER — OXYCODONE HCL 5 MG PO TABS
10.0000 mg | ORAL_TABLET | ORAL | Status: DC | PRN
  Administered 2018-12-22 – 2018-12-25 (×11): 10 mg via ORAL
  Filled 2018-12-22 (×11): qty 2

## 2018-12-22 NOTE — Progress Notes (Signed)
Family Meeting Note  Advance Directive:yes  Today a meeting took place with the Patient.  Patient is able to participate   The following clinical team members were present during this meeting:MD  The following were discussed:Patient's diagnosis: Metastatic melanoma, Patient's progosis: Unable to determine and Goals for treatment: Full Code  Additional follow-up to be provided: full  Time spent during discussion:20 minutes  Gorden Harms, MD

## 2018-12-22 NOTE — Progress Notes (Signed)
Pharmacy Antibiotic Note  Louis Davies is a 68 y.o. male admitted on 12/22/2018 with sepsis unknown source.  Pharmacy has been consulted for vancomycin and cefepime dosing. Pt received cefepime,. Flagyl, and vancomycin in the ED.   Plan: Cefepime 2 g IV q8h   Vancomycin 1000 mg IV Q 12 hrs. Goal AUC 400-550. Expected AUC: 515 SCr used: 0.87 Trough expected: 14.4  SCr ordered for AM to continue to monitor renal function   Height: 6' (182.9 cm) Weight: 161 lb 13.1 oz (73.4 kg) IBW/kg (Calculated) : 77.6  Temp (24hrs), Avg:98.9 F (37.2 C), Min:98.9 F (37.2 C), Max:98.9 F (37.2 C)  Recent Labs  Lab 12/22/18 1133 12/22/18 1527  WBC 22.0*  --   CREATININE 0.87  --   LATICACIDVEN 2.1* 0.8    Estimated Creatinine Clearance: 83.2 mL/min (by C-G formula based on SCr of 0.87 mg/dL).    No Known Allergies  Antimicrobials this admission: Cefepime 3/4 >> Flagyl 3/4 >> vanc 3/4 >>  Dose adjustments this admission:   Microbiology results: 3/4 BCx: sent 3/4 UCx: sent    Thank you for allowing pharmacy to be a part of this patient's care.  Rocky Morel 12/22/2018 4:54 PM

## 2018-12-22 NOTE — H&P (Signed)
[facility] NAME: Johnothan Bascomb    MR#:  [id number]  DATE OF BIRTH:  [birth year]  DATE OF ADMISSION:  12/22/2018  PRIMARY CARE PHYSICIAN: Dolleschel, Raquel Sarna, FNP   REQUESTING/REFERRING PHYSICIAN:   CHIEF COMPLAINT:   Chief Complaint  Patient presents with  . Near Syncope    HISTORY OF PRESENT ILLNESS: Zalman Hull  is a 68 y.o. male with a known history per below that includes melanoma with mets, on chemotherapy, chronic leg axillary mass that has been debulked 3 times by Dr. Ezequiel Kayser surgery, sent to [facility] general surgery for further surgical evaluation-plans for MRI next week-if does not involve the chest wall will plan for left arm resection, presents today with acute collapse, found on floor by wife, in the emergency room patient was found to be hypotensive, tachycardic, hemoglobin 5.4, lactic acid 2.1, chest x-ray negative, white count 22,000, code sepsis initiated, patient evaluated at the bedside, wife and family at the bedside, patient is now been admitted for acute syncopal episode most likely secondary to worsening symptomatic anemia due to chronic disease/chemotherapy, and acute sepsis in patient with immunocompromise state from cancer/immunosuppression agents.  PAST MEDICAL HISTORY:   Past Medical History:  Diagnosis Date  . Anemia   . Arthritis   . Cancer ([place])    MELANOMA  . Complication of anesthesia   . Dyspnea    doe  . Perforated viscus   . PONV (postoperative nausea and vomiting)     PAST SURGICAL HISTORY:  Past Surgical History:  Procedure Laterality Date  . AXILLARY LYMPH NODE DISSECTION Left 06/26/2017   Procedure: EXCISION OF LEFT AXILLARY MASS;  Surgeon: Johnathan Hausen, MD;  Location: [facility] ORS;  Service: General;  Laterality: Left;  . AXILLARY LYMPH NODE DISSECTION Left 11/01/2018   Procedure: AXILLARY DEBRIDEMENT;  Surgeon: Vickie Epley, MD;  Location: [facility] ORS;  Service: General;  Laterality: Left;   . AXILLARY LYMPH NODE DISSECTION Left 12/06/2018   Procedure: PALLIATIVE DEBULKING OF AXILLARY TUMOR-LEFT;  Surgeon: Vickie Epley, MD;  Location: [facility] ORS;  Service: General;  Laterality: Left;  . DIAGNOSTIC LAPAROSCOPY    . EMBOLIZATION Left 05/11/2018   Procedure: EMBOLIZATION;  Surgeon: Katha Cabal, MD;  Location: [place] [facility];  Service: Cardiovascular;  Laterality: Left;  . LAPAROTOMY N/A 10/14/2017   Procedure: EXPLORATORY LAPAROTOMY;  Surgeon: Clayburn Pert, MD;  Location: [facility] ORS;  Service: General;  Laterality: N/A;  . VASECTOMY    . WOUND DEBRIDEMENT Left 09/24/2018   Procedure: LEFT AXILLARY EXCISIONAL DEBRIDEMENT OF NECROTIC TISSUE;  Surgeon: Vickie Epley, MD;  Location: [facility] ORS;  Service: General;  Laterality: Left;    SOCIAL HISTORY:  Social History   Tobacco Use  . Smoking status: Never Smoker  . Smokeless tobacco: Never Used  Substance Use Topics  . Alcohol use: No    FAMILY HISTORY:  Family History  Problem Relation Age of Onset  . Diabetes Mother   . Hypertension Mother   . Leukemia Father   . Thyroid disease Sister   . Liver disease Maternal Grandmother   . Heart Problems Maternal Grandfather   . Diabetes Maternal Grandfather   . Lung cancer Paternal Grandmother   . Prostate cancer Paternal Grandfather     DRUG ALLERGIES: No Known Allergies  REVIEW OF SYSTEMS:   CONSTITUTIONAL: No fever, +fatigue, weakness. + Fall EYES: No blurred or double vision.  EARS, NOSE, AND THROAT: No tinnitus or ear pain.  RESPIRATORY:  No cough, shortness of breath, wheezing or hemoptysis.  CARDIOVASCULAR: No chest pain, orthopnea, edema.  GASTROINTESTINAL: No nausea, vomiting, diarrhea or abdominal pain.  GENITOURINARY: No dysuria, hematuria.  ENDOCRINE: No polyuria, nocturia,  HEMATOLOGY: No anemia, easy bruising or bleeding SKIN: No rash or lesion. MUSCULOSKELETAL: Chronic left arm pain NEUROLOGIC: No tingling, numbness, weakness. +  LOC PSYCHIATRY: No anxiety or depression.   MEDICATIONS AT HOME:  Prior to Admission medications   Medication Sig Start Date End Date Taking? Authorizing Provider  Binimetinib (MEKTOVI) 15 MG TABS Take 45 mg by mouth 2 (two) times daily. Patient taking differently: Take 15 mg by mouth 2 (two) times daily.  11/01/18  Yes Lloyd Huger, MD  Encorafenib 75 MG CAPS Take 450 mg by mouth daily. Patient taking differently: Take 225 mg by mouth daily.  11/01/18  Yes Lloyd Huger, MD  fentaNYL (DURAGESIC) 25 MCG/HR Place 1 patch onto the skin every 3 (three) days. 12/14/18  Yes Lloyd Huger, MD  Multiple Vitamin (MULTIVITAMIN) tablet Take 1 tablet by mouth daily.   Yes [provider]  Oxycodone HCl 10 MG TABS Take 1 tablet (10 mg total) by mouth every 4 (four) hours as needed. 12/14/18  Yes Borders, Kirt Boys, NP  ibuprofen (ADVIL,MOTRIN) 200 MG tablet Take 200 mg by mouth every 6 (six) hours as needed for moderate pain.     [provider]  ondansetron (ZOFRAN) 8 MG tablet Take 1 tablet (8 mg total) by mouth every 8 (eight) hours as needed for nausea or vomiting. 12/21/18   Borders, Kirt Boys, NP      PHYSICAL EXAMINATION:   VITAL SIGNS: Blood pressure (!) 79/54, pulse 96, temperature 98.9 F (37.2 C), temperature source Oral, resp. rate 15, height 6' (1.829 m), weight 73.4 kg, SpO2 100 %.  GENERAL:  68 y.o.-year-old patient lying in the bed with no acute distress.  EYES: Pupils equal, round, reactive to light and accommodation. No scleral icterus. Extraocular muscles intact.  HEENT: Head atraumatic, normocephalic. Oropharynx and nasopharynx clear.  NECK:  Supple, no jugular venous distention. No thyroid enlargement, no tenderness.  LUNGS: Normal breath sounds bilaterally, no wheezing, rales,rhonchi or crepitation. No use of accessory muscles of respiration.  CARDIOVASCULAR: S1, S2 normal. No murmurs, rubs, or gallops.  ABDOMEN: Soft, nontender, nondistended. Bowel  sounds present. No organomegaly or mass.  EXTREMITIES: Left axillary mass with skin necrosis/erythema, foul-smelling  nEUROLOGIC: Cranial nerves II through XII are intact. Muscle strength 5/5 in all extremities. Sensation intact. Gait not checked.  PSYCHIATRIC: The patient is alert and oriented x 3.  SKIN: Chronic left axillary mass with erythema, foul-smelling  LABORATORY PANEL:   CBC Recent Labs  Lab 12/22/18 1133  WBC 22.0*  HGB 5.4*  HCT 17.8*  PLT 358  MCV 77.7*  MCH 23.6*  MCHC 30.3  RDW 18.7*  LYMPHSABS 0.5*  MONOABS 1.0  EOSABS 0.0  BASOSABS 0.0   ------------------------------------------------------------------------------------------------------------------  Chemistries  Recent Labs  Lab 12/22/18 1133  NA 132*  K 4.1  CL 99  CO2 22  GLUCOSE 111*  BUN 22  CREATININE 0.87  CALCIUM 7.1*  AST 23  ALT 19  ALKPHOS 63  BILITOT 0.3   ------------------------------------------------------------------------------------------------------------------ estimated creatinine clearance is 83.2 mL/min (by C-G formula based on SCr of 0.87 mg/dL). ------------------------------------------------------------------------------------------------------------------ No results for input(s): TSH, T4TOTAL, T3FREE, THYROIDAB in the last 72 hours.  Invalid input(s): FREET3   Coagulation profile No results for input(s): INR, PROTIME in the last 168 hours. -------------------------------------------------------------------------------------------------------------------  No results for input(s): DDIMER in the last 72 hours. -------------------------------------------------------------------------------------------------------------------  Cardiac Enzymes No results for input(s): CKMB, TROPONINI, MYOGLOBIN in the last 168 hours.  Invalid input(s): CK ------------------------------------------------------------------------------------------------------------------ Invalid  input(s): POCBNP  ---------------------------------------------------------------------------------------------------------------  Urinalysis    Component Value Date/Time   COLORURINE YELLOW (A) 12/22/2018 1418   APPEARANCEUR CLEAR (A) 12/22/2018 1418   LABSPEC 1.018 12/22/2018 1418   PHURINE 5.0 12/22/2018 1418   GLUCOSEU NEGATIVE 12/22/2018 1418   HGBUR NEGATIVE 12/22/2018 1418   BILIRUBINUR NEGATIVE 12/22/2018 1418   KETONESUR NEGATIVE 12/22/2018 1418   PROTEINUR NEGATIVE 12/22/2018 1418   NITRITE NEGATIVE 12/22/2018 1418   LEUKOCYTESUR NEGATIVE 12/22/2018 1418     RADIOLOGY: Ct Angio Chest Pe W And/or Wo Contrast  Result Date: 12/22/2018 EXAM: CT ANGIOGRAPHY CHEST WITH CONTRAST TECHNIQUE: Multidetector CT imaging of the chest was performed using the standard protocol during bolus administration of intravenous contrast. Multiplanar CT image reconstructions and MIPs were obtained to evaluate the vascular anatomy. CONTRAST:  26mL OMNIPAQUE IOHEXOL 350 MG/ML SOLN COMPARISON:  PET-CT 10/22/2018 and previous FINDINGS: Cardiovascular: Heart size normal. No pericardial effusion. Dilated central pulmonary arteries. Satisfactory opacification of pulmonary arteries noted, and there is no evidence of pulmonary emboli. Adequate contrast opacification of the thoracic aorta with no evidence of dissection, aneurysm, or stenosis. There is classic 3-vessel brachiocephalic arch anatomy without proximal stenosis. No significant atheromatous plaque. Mediastinum/Nodes: No hilar or mediastinal adenopathy. Lungs/Pleura: Trace bilateral pleural effusions. No pneumothorax. Dependent atelectasis posteriorly in both lower lobes. Calcified left lower lobe granuloma. 3 mm nonspecific nodules in the right lower lobe image 56/6 and right upper lobe image 52/6. Upper Abdomen: Stable hepatic segment 4A probable cyst. No acute findings. Musculoskeletal: Significant interval enlargement of left axillary mass no measuring at  least 19.3 cm, incompletely visualized. Stable adjacent embolization coils. Anterior vertebral endplate spurring at multiple levels in the mid and lower thoracic spine. No fracture or worrisome bone lesion. Review of the MIP images confirms the above findings. IMPRESSION: 1. Negative for acute PE or thoracic aortic dissection. 2. Trace bilateral pleural effusions. 3. Interval enlargement of left axillary mass, incompletely visualized. Electronically Signed   By: Lucrezia Europe M.D.   On: 12/22/2018 15:59   Dg Chest Port 1 View  Result Date: 12/22/2018 CLINICAL DATA:  Near syncope.  History of melanoma EXAM: PORTABLE CHEST 1 VIEW COMPARISON:  10/15/2017 FINDINGS: Heart size and vascularity normal. Lungs are clear without infiltrate or effusion Large left axillary mass. Embolization coils in the left axillary region. IMPRESSION: No active disease. Electronically Signed   By: Franchot Gallo M.D.   On: 12/22/2018 12:07    EKG: Orders placed or performed during the hospital encounter of 12/22/18  . EKG 12-Lead  . EKG 12-Lead    IMPRESSION AND PLAN: *Acute sepsis Exact etiology unknown-suspected due to chronic left axillary melanoma lesion that is foul-smelling with associated cellulitis Admit to regular nursing for bed on our sepsis protocol, empiric cefepime/Flagyl/vancomycin, follow-up on cultures  *Acute syncope with collapse secondary to acute on chronic worsening symptomatic anemia of chronic disease/chemotherapy Continue 2 unit packed red blood cell transfusion as ordered in the emergency room, CBC in the morning  *Chronic metastatic melanoma with left axillary lesion requiring frequent tumor debulking-last one was by Dr. Rosana Hoes December 06, 2018 Plans are for follow-up at [facility] next week for MRI of the chest-if there is no involvement of the chest wall, plans were for left upper extremity amputation per family Consult Dr. Grayland Ormond for continuity of care  Adult pain protocol  *Chronic  malnutrition Secondary to cancer Dietary consulted  All the records are reviewed and case discussed with ED provider. Management plans discussed with the patient, family and they are in agreement.  CODE STATUS:full Code Status History    Date Active Date Inactive Code Status Order ID Comments User Context   09/24/2018 1551 09/25/2018 2009 Full Code [id number]  Carlus Pavlov Inpatient   05/10/2018 1725 05/12/2018 1336 Full Code [id number]  Fritzi Mandes, MD Inpatient   10/10/2017 1808 10/26/2017 1850 Full Code [id number]  SalaryAvel Peace, MD Inpatient    Advance Directive Documentation     Most Recent Value  Type of Advance Directive  Healthcare Power of Attorney  Pre-existing out of facility DNR order (yellow form or pink MOST form)  -  "MOST" Form in Place?  -       TOTAL TIME TAKING CARE OF THIS PATIENT: 45 minutes.    Avel Peace Chadd Tollison M.D on 12/22/2018   Between 7am to 6pm - Pager - [phone]  After 6pm go to [email or web address] - password [email or web address] [facility]  Office  [phone]  CC: Primary care physician; Alvera Singh, FNP   Note: This dictation was prepared with Dragon dictation along with smaller phrase technology. Any transcriptional errors that result from this process are unintentional.

## 2018-12-22 NOTE — Progress Notes (Signed)
CODE SEPSIS - PHARMACY COMMUNICATION  **Broad Spectrum Antibiotics should be administered within 1 hour of Sepsis diagnosis**  Time Code Sepsis Called/Page Received: 1148  Antibiotics Ordered: cefepime, Flagyl, vanc  Time of 1st antibiotic administration: 2094  Additional action taken by pharmacy: called ED RN at 1248 as no abx have been ordered (delay in calling due to staffing in Main and taking care of code blue pt needs/stat drips)  If necessary, Name of Provider/Nurse Contacted: Jeannette RN    Rocky Morel ,PharmD Clinical Pharmacist  12/22/2018  1:21 PM

## 2018-12-22 NOTE — ED Notes (Signed)
Urine specimen sent.

## 2018-12-22 NOTE — ED Notes (Signed)
Consent for transfusion obtained.

## 2018-12-22 NOTE — ED Notes (Signed)
ED TO INPATIENT HANDOFF REPORT  ED Nurse Name and Phone #: Skyler   S Name/Age/Gender Louis Davies 68 y.o. male Room/Bed: ED09A/ED09A  Code Status   Code Status: Full Code  Home/SNF/Other Home Patient oriented to: self Is this baseline? Yes   Triage Complete: Triage complete  Chief Complaint syncopal episode  Triage Note S/p syncope. Walking to barroom at home  Found on ground by wide.    Allergies No Known Allergies  Level of Care/Admitting Diagnosis ED Disposition    ED Disposition Condition [facility] Area: [place] [[id number]]  Level of Care: Med-Surg [16]  Diagnosis: Sepsis [facility]) [[id number]]  Admitting Physician: Gorden Harms [[id number]]  Attending Physician: Gorden Harms [[id number]]  Estimated length of stay: past midnight tomorrow  Certification:: I certify this patient will need inpatient services for at least 2 midnights  PT Class (Do Not Modify): Inpatient [101]  PT Acc Code (Do Not Modify): Private [1]       B Medical/Surgery History Past Medical History:  Diagnosis Date  . Anemia   . Arthritis   . Cancer ([place])    MELANOMA  . Complication of anesthesia   . Dyspnea    doe  . Perforated viscus   . PONV (postoperative nausea and vomiting)    Past Surgical History:  Procedure Laterality Date  . AXILLARY LYMPH NODE DISSECTION Left 06/26/2017   Procedure: EXCISION OF LEFT AXILLARY MASS;  Surgeon: Johnathan Hausen, MD;  Location: [facility] ORS;  Service: General;  Laterality: Left;  . AXILLARY LYMPH NODE DISSECTION Left 11/01/2018   Procedure: AXILLARY DEBRIDEMENT;  Surgeon: Vickie Epley, MD;  Location: [facility] ORS;  Service: General;  Laterality: Left;  . AXILLARY LYMPH NODE DISSECTION Left 12/06/2018   Procedure: PALLIATIVE DEBULKING OF AXILLARY TUMOR-LEFT;  Surgeon: Vickie Epley, MD;  Location: [facility] OR[facility];  Service: General;  Laterality: Left;  . DIAGNOSTIC LAPAROSCOPY    . EMBOLIZATION Left 05/11/2018    Procedure: EMBOLIZATION;  Surgeon: Katha Cabal, MD;  Location: [facility];  Service: Cardiovascular;  Laterality: Left;  . LAPAROTOMY N/A 10/14/2017   Procedure: EXPLORATORY LAPAROTOMY;  Surgeon: Clayburn Pert, MD;  Location: [facility];  Service: General;  Laterality: N/A;  . VASECTOMY    . WOUND DEBRIDEMENT Left 09/24/2018   Procedure: LEFT AXILLARY EXCISIONAL DEBRIDEMENT OF NECROTIC TISSUE;  Surgeon: Vickie Epley, MD;  Location: [facility];  Service: General;  Laterality: Left;     A IV Location/Drains/Wounds Patient Lines/Drains/Airways Status   Active Line/Drains/Airways    Name:   Placement date:   Placement time:   Site:   Days:   Peripheral IV 12/22/18 Left Hand   12/22/18    1140    Hand   less than 1   Peripheral IV 12/22/18 Right Forearm   12/22/18    -    Forearm   less than 1   Sheath 05/11/18 Arterial;Femoral   05/11/18    1443    Arterial;Femoral   225   Incision (Closed) 09/24/18 Axilla Left   09/24/18    0823     89   Incision (Closed) 11/01/18 Axilla Left   11/01/18    0842     51   Incision (Closed) 12/06/18 Arm Left   12/06/18    0945     16          Intake/Output Last 24 hours  Intake/Output Summary (Last 24 hours) at 12/22/2018 1821 Last  data filed at 12/22/2018 1724 Gross per 24 hour  Intake 450 ml  Output -  Net 450 ml    Labs/Imaging Results for orders placed or performed during the hospital encounter of 12/22/18 (from the past 48 hour(s))  Lactic acid, plasma     Status: Abnormal   Collection Time: 12/22/18 11:33 AM  Result Value Ref Range   Lactic Acid, Venous 2.1 (HH) 0.5 - 1.9 mmol/L    Comment: CRITICAL RESULT CALLED TO, READ BACK BY AND VERIFIED WITH JEANETTE PEREZ 12/22/18 1223 KLW Performed at [facility], [address] [place], [place] [address]   Comprehensive metabolic panel     Status: Abnormal   Collection Time: 12/22/18 11:33 AM  Result Value Ref Range   Sodium 132 (L) 135 - 145 mmol/L   Potassium 4.1  3.5 - 5.1 mmol/L   Chloride 99 98 - 111 mmol/L   CO2 22 22 - 32 mmol/L   Glucose, Bld 111 (H) 70 - 99 mg/dL   BUN 22 8 - 23 mg/dL   Creatinine, Ser 0.87 0.61 - 1.24 mg/dL   Calcium 7.1 (L) 8.9 - 10.3 mg/dL   Total Protein 4.3 (L) 6.5 - 8.1 g/dL   Albumin 1.6 (L) 3.5 - 5.0 g/dL   AST 23 15 - 41 U/L   ALT 19 0 - 44 U/L   Alkaline Phosphatase 63 38 - 126 U/L   Total Bilirubin 0.3 0.3 - 1.2 mg/dL   GFR calc non Af Amer >60 >60 mL/min   GFR calc Af Amer >60 >60 mL/min   Anion gap 11 5 - 15    Comment: Performed at [facility], [address] [place], [place] [address]  Lipase, blood     Status: None   Collection Time: 12/22/18 11:33 AM  Result Value Ref Range   Lipase 25 11 - 51 U/L    Comment: Performed at [facility], [address] [place], [place] [address]  CBC WITH DIFFERENTIAL     Status: Abnormal   Collection Time: 12/22/18 11:33 AM  Result Value Ref Range   WBC 22.0 (H) 4.0 - 10.5 K/uL   RBC 2.29 (L) 4.22 - 5.81 MIL/uL   Hemoglobin 5.4 (L) 13.0 - 17.0 g/dL    Comment: Reticulocyte Hemoglobin testing may be clinically indicated, consider ordering this additional test [id number]    HCT 17.8 (L) 39.0 - 52.0 %   MCV 77.7 (L) 80.0 - 100.0 fL   MCH 23.6 (L) 26.0 - 34.0 pg   MCHC 30.3 30.0 - 36.0 g/dL   RDW 18.7 (H) 11.5 - 15.5 %   Platelets 358 150 - 400 K/uL   nRBC 0.0 0.0 - 0.2 %   Neutrophils Relative % 92 %   Neutro Abs 20.2 (H) 1.7 - 7.7 K/uL   Lymphocytes Relative 2 %   Lymphs Abs 0.5 (L) 0.7 - 4.0 K/uL   Monocytes Relative 5 %   Monocytes Absolute 1.0 0.1 - 1.0 K/uL   Eosinophils Relative 0 %   Eosinophils Absolute 0.0 0.0 - 0.5 K/uL   Basophils Relative 0 %   Basophils Absolute 0.0 0.0 - 0.1 K/uL   Immature Granulocytes 1 %   Abs Immature Granulocytes 0.20 (H) 0.00 - 0.07 K/uL    Comment: Performed at [facility], [address], [place], [place] [address]  Procalcitonin     Status: None   Collection Time: 12/22/18 11:33 AM   Result Value Ref Range  Procalcitonin 0.21 ng/mL    Comment:        Interpretation: PCT (Procalcitonin) <= 0.5 ng/mL: Systemic infection (sepsis) is not likely. Local bacterial infection is possible. (NOTE)       Sepsis PCT Algorithm           Lower Respiratory Tract                                      Infection PCT Algorithm    ----------------------------     ----------------------------         PCT < 0.25 ng/mL                PCT < 0.10 ng/mL         Strongly encourage             Strongly discourage   discontinuation of antibiotics    initiation of antibiotics    ----------------------------     -----------------------------       PCT 0.25 - 0.50 ng/mL            PCT 0.10 - 0.25 ng/mL               OR       >80% decrease in PCT            Discourage initiation of                                            antibiotics      Encourage discontinuation           of antibiotics    ----------------------------     -----------------------------         PCT >= 0.50 ng/mL              PCT 0.26 - 0.50 ng/mL               AND        <80% decrease in PCT             Encourage initiation of                                             antibiotics       Encourage continuation           of antibiotics    ----------------------------     -----------------------------        PCT >= 0.50 ng/mL                  PCT > 0.50 ng/mL               AND         increase in PCT                  Strongly encourage                                      initiation of antibiotics    Strongly encourage escalation           of antibiotics                                     -----------------------------  PCT <= 0.25 ng/mL                                                 OR                                        > 80% decrease in PCT                                     Discontinue / Do not initiate                                             antibiotics Performed  at [facility], [address] [place], [place] [address]   Urinalysis, Complete w Microscopic     Status: Abnormal   Collection Time: 12/22/18  2:18 PM  Result Value Ref Range   Color, Urine YELLOW (A) YELLOW   APPearance CLEAR (A) CLEAR   Specific Gravity, Urine 1.018 1.005 - 1.030   pH 5.0 5.0 - 8.0   Glucose, UA NEGATIVE NEGATIVE mg/dL   Hgb urine dipstick NEGATIVE NEGATIVE   Bilirubin Urine NEGATIVE NEGATIVE   Ketones, ur NEGATIVE NEGATIVE mg/dL   Protein, ur NEGATIVE NEGATIVE mg/dL   Nitrite NEGATIVE NEGATIVE   Leukocytes,Ua NEGATIVE NEGATIVE   RBC / HPF 0-5 0 - 5 RBC/hpf   WBC, UA 0-5 0 - 5 WBC/hpf   Bacteria, UA NONE SEEN NONE SEEN   Squamous Epithelial / LPF 0-5 0 - 5   Mucus PRESENT     Comment: Performed at [facility], [address] [place], [place] [address]  Lactic acid, plasma     Status: None   Collection Time: 12/22/18  3:27 PM  Result Value Ref Range   Lactic Acid, Venous 0.8 0.5 - 1.9 mmol/L    Comment: Performed at [facility], [address] [place], [place] [address]  Prepare RBC     Status: None   Collection Time: 12/22/18  3:28 PM  Result Value Ref Range   Order Confirmation      ORDER PROCESSED BY BLOOD BANK Performed at [facility], [address] [place], [place] [address]   Type and screen Ordered by PROVIDER DEFAULT     Status: None (Preliminary result)   Collection Time: 12/22/18  3:28 PM  Result Value Ref Range   ABO/RH(D) B NEG    Antibody Screen NEG    Sample Expiration 12/25/2018    Unit Number [id number]    Blood Component Type RED CELLS,LR    Unit division 00    Status of Unit ALLOCATED    Transfusion Status OK TO TRANSFUSE    Crossmatch Result      Compatible Performed at [facility], [address] [place],  [address]    Unit Number [id number]    Blood Component Type RED CELLS,LR    Unit division 00    Status of Unit ALLOCATED    Transfusion Status OK  TO TRANSFUSE    Crossmatch Result Compatible    Ct  Angio Chest Pe W And/or Wo Contrast  Result Date: 12/22/2018 EXAM: CT ANGIOGRAPHY CHEST WITH CONTRAST TECHNIQUE: Multidetector CT imaging of the chest was performed using the standard protocol during bolus administration of intravenous contrast. Multiplanar CT image reconstructions and MIPs were obtained to evaluate the vascular anatomy. CONTRAST:  23mL OMNIPAQUE IOHEXOL 350 MG/ML SOLN COMPARISON:  PET-CT 10/22/2018 and previous FINDINGS: Cardiovascular: Heart size normal. No pericardial effusion. Dilated central pulmonary arteries. Satisfactory opacification of pulmonary arteries noted, and there is no evidence of pulmonary emboli. Adequate contrast opacification of the thoracic aorta with no evidence of dissection, aneurysm, or stenosis. There is classic 3-vessel brachiocephalic arch anatomy without proximal stenosis. No significant atheromatous plaque. Mediastinum/Nodes: No hilar or mediastinal adenopathy. Lungs/Pleura: Trace bilateral pleural effusions. No pneumothorax. Dependent atelectasis posteriorly in both lower lobes. Calcified left lower lobe granuloma. 3 mm nonspecific nodules in the right lower lobe image 56/6 and right upper lobe image 52/6. Upper Abdomen: Stable hepatic segment 4A probable cyst. No acute findings. Musculoskeletal: Significant interval enlargement of left axillary mass no measuring at least 19.3 cm, incompletely visualized. Stable adjacent embolization coils. Anterior vertebral endplate spurring at multiple levels in the mid and lower thoracic spine. No fracture or worrisome bone lesion. Review of the MIP images confirms the above findings. IMPRESSION: 1. Negative for acute PE or thoracic aortic dissection. 2. Trace bilateral pleural effusions. 3. Interval enlargement of left axillary mass, incompletely visualized. Electronically Signed   By: Lucrezia Europe M.D.   On: 12/22/2018 15:59   Dg Chest Port 1 View  Result Date:  12/22/2018 CLINICAL DATA:  Near syncope.  History of melanoma EXAM: PORTABLE CHEST 1 VIEW COMPARISON:  10/15/2017 FINDINGS: Heart size and vascularity normal. Lungs are clear without infiltrate or effusion Large left axillary mass. Embolization coils in the left axillary region. IMPRESSION: No active disease. Electronically Signed   By: Franchot Gallo M.D.   On: 12/22/2018 12:07    Pending Labs Unresulted Labs (From admission, onward)    Start     Ordered   12/29/18 0500  Creatinine, serum  (enoxaparin (LOVENOX)    CrCl >/= 30 ml/min)  Weekly,   STAT    Comments:  while on enoxaparin therapy    12/22/18 1725   12/23/18 0500  Protime-INR  Tomorrow morning,   STAT     12/22/18 1725   12/23/18 0500  Cortisol-am, blood  Tomorrow morning,   STAT     12/22/18 1725   12/23/18 0500  Procalcitonin  Tomorrow morning,   STAT     12/22/18 1725   12/23/18 0500  CBC  Tomorrow morning,   STAT     12/22/18 1725   12/23/18 5003  Basic metabolic panel  Tomorrow morning,   STAT     12/22/18 1725   12/22/18 1726  CBC  (enoxaparin (LOVENOX)    CrCl >/= 30 ml/min)  Once,   STAT    Comments:  Baseline for enoxaparin therapy IF NOT ALREADY DRAWN.  Notify MD if PLT < 100 K.    12/22/18 1725   12/22/18 1726  Creatinine, serum  (enoxaparin (LOVENOX)    CrCl >/= 30 ml/min)  Once,   STAT    Comments:  Baseline for enoxaparin therapy IF NOT ALREADY DRAWN.    12/22/18 1725   12/22/18 1215  Protime-INR  Once,   STAT     12/22/18 1215   12/22/18 1215  APTT  Once,   STAT     12/22/18 1215  12/22/18 1148  Blood Culture (routine x 2)  BLOOD CULTURE X 2,   STAT     12/22/18 1148   12/22/18 1148  Urine culture  ONCE - STAT,   STAT     12/22/18 1148          Vitals/Pain Today's Vitals   12/22/18 1700 12/22/18 1715 12/22/18 1730 12/22/18 1800  BP: (!) 86/50  (!) 84/51 (!) 93/51  Pulse:   80   Resp: 18 16 (!) 21 (!) 32  Temp:      TempSrc:      SpO2:   (!) 83%   Weight:      Height:      PainSc:         Isolation Precautions No active isolations  Medications Medications  metroNIDAZOLE (FLAGYL) IVPB 500 mg (0 mg Intravenous Stopped 12/22/18 1409)  fentaNYL (DURAGESIC) 25 MCG/HR 1 patch (1 patch Transdermal Not Given 12/22/18 1736)  ibuprofen (ADVIL,MOTRIN) tablet 200 mg (has no administration in time range)  Oxycodone HCl TABS 10 mg (has no administration in time range)  Binimetinib TABS 15 mg (has no administration in time range)  Encorafenib CAPS 225 mg (has no administration in time range)  ondansetron (ZOFRAN) tablet 8 mg (has no administration in time range)  multivitamin tablet 1 tablet (has no administration in time range)  enoxaparin (LOVENOX) injection 40 mg (has no administration in time range)  dextrose 5 %-0.9 % sodium chloride infusion (has no administration in time range)  ceFEPIme (MAXIPIME) 2 g in sodium chloride 0.9 % 100 mL IVPB (has no administration in time range)  metroNIDAZOLE (FLAGYL) IVPB 500 mg (has no administration in time range)  vancomycin (VANCOCIN) IVPB 1000 mg/200 mL premix (has no administration in time range)  docusate sodium (COLACE) capsule 100 mg (has no administration in time range)  polyethylene glycol (MIRALAX / GLYCOLAX) packet 17 g (has no administration in time range)  promethazine (PHENERGAN) tablet 12.5 mg (has no administration in time range)  albuterol (PROVENTIL) (2.5 MG/3ML) 0.083% nebulizer solution 2.5 mg (has no administration in time range)  morphine 2 MG/ML injection 2 mg (has no administration in time range)  ceFEPIme (MAXIPIME) 2 g in sodium chloride 0.9 % 100 mL IVPB (has no administration in time range)  vancomycin (VANCOCIN) IVPB 1000 mg/200 mL premix (has no administration in time range)  ceFEPIme (MAXIPIME) 2 g in sodium chloride 0.9 % 100 mL IVPB (0 g Intravenous Stopped 12/22/18 1409)  vancomycin (VANCOCIN) 1,500 mg in sodium chloride 0.9 % 500 mL IVPB (0 mg Intravenous Stopped 12/22/18 1724)  iohexol (OMNIPAQUE) 350 MG/ML  injection 75 mL (75 mLs Intravenous Contrast Given 12/22/18 1533)  0.9 %  sodium chloride infusion (10 mL/hr Intravenous New Bag/Given 12/22/18 1616)    Mobility walks High fall risk   Focused Assessments Cardiac Assessment Handoff:  Cardiac Rhythm: Normal sinus rhythm No results found for: CKTOTAL, CKMB, CKMBINDEX, TROPONINI No results found for: DDIMER Does the Patient currently have chest pain?no      R Recommendations: See Admitting Provider Note  Report given to:   Additional Notes:

## 2018-12-22 NOTE — ED Notes (Signed)
Report given to skyler RN, Nurse made aware that blood bank called and 1st unit is ready for this patient. Patient will go up to unit and blood will be started in assigned bed.

## 2018-12-22 NOTE — ED Provider Notes (Signed)
[facility] Emergency Department Provider Note  ____________________________________________  Time seen: Approximately 3:18 PM  I have reviewed the triage vital signs and the nursing notes.   HISTORY  Chief Complaint Near Syncope    HPI Louis Davies is a 68 y.o. male with a history of melanoma, chronic anemia who comes the ED  after an episode of syncope at home.  Got dizzy and passed out, feels weak.  Denies chest pain or shortness of breath.  No black or bloody stool, no vomiting.  Still feels weak.  Standing up, no alleviating factors.  Currently receiving chemotherapy for his metastatic melanoma   Past Medical History:  Diagnosis Date  . Anemia   . Arthritis   . Cancer ([place])    MELANOMA  . Complication of anesthesia   . Dyspnea    doe  . Perforated viscus   . PONV (postoperative nausea and vomiting)      Patient Active Problem List   Diagnosis Date Noted  . Cancer associated pain 11-08-18  . On antineoplastic chemotherapy 08-Nov-2018  . Localized tissue death ([place])   . Malignant melanoma of left upper extremity including shoulder ([place])   . Open wound of left axillary region with complication 71/69/6789  . Anemia 05/10/2018  . Ileus ([place])   . Constipation 10/10/2017  . Metastatic melanoma ([place]) 07/07/2017  . Axillary mass, left 06/26/2017  . Chronic pain of left knee 12/14/2015  . Influenza vaccination declined 11/27/2015     Past Surgical History:  Procedure Laterality Date  . AXILLARY LYMPH NODE DISSECTION Left 06/26/2017   Procedure: EXCISION OF LEFT AXILLARY MASS;  Surgeon: Johnathan Hausen, MD;  Location: [facility] ORS;  Service: General;  Laterality: Left;  . AXILLARY LYMPH NODE DISSECTION Left 11/01/2018   Procedure: AXILLARY DEBRIDEMENT;  Surgeon: Vickie Epley, MD;  Location: [facility] ORS;  Service: General;  Laterality: Left;  . AXILLARY LYMPH NODE DISSECTION Left 12/06/2018   Procedure: PALLIATIVE DEBULKING OF AXILLARY  TUMOR-LEFT;  Surgeon: Vickie Epley, MD;  Location: [facility] ORS;  Service: General;  Laterality: Left;  . DIAGNOSTIC LAPAROSCOPY    . EMBOLIZATION Left 05/11/2018   Procedure: EMBOLIZATION;  Surgeon: Katha Cabal, MD;  Location: [place] [facility];  Service: Cardiovascular;  Laterality: Left;  . LAPAROTOMY N/A 10/14/2017   Procedure: EXPLORATORY LAPAROTOMY;  Surgeon: Clayburn Pert, MD;  Location: [facility] ORS;  Service: General;  Laterality: N/A;  . VASECTOMY    . WOUND DEBRIDEMENT Left 09/24/2018   Procedure: LEFT AXILLARY EXCISIONAL DEBRIDEMENT OF NECROTIC TISSUE;  Surgeon: Vickie Epley, MD;  Location: [facility] OR[facility];  Service: General;  Laterality: Left;     Prior to Admission medications   Medication Sig Start Date End Date Taking? Authorizing Provider  Binimetinib (MEKTOVI) 15 MG TABS Take 45 mg by mouth 2 (two) times daily. Patient taking differently: Take 15 mg by mouth 2 (two) times daily.  11/01/18  Yes Lloyd Huger, MD  Encorafenib 75 MG CAPS Take 450 mg by mouth daily. Patient taking differently: Take 225 mg by mouth daily.  11/01/18  Yes Lloyd Huger, MD  fentaNYL (DURAGESIC) 25 MCG/HR Place 1 patch onto the skin every 3 (three) days. 12/14/18  Yes Lloyd Huger, MD  Multiple Vitamin (MULTIVITAMIN) tablet Take 1 tablet by mouth daily.   Yes [provider]  Oxycodone HCl 10 MG TABS Take 1 tablet (10 mg total) by mouth every 4 (four) hours as needed. 12/14/18  Yes Borders, Kirt Boys, NP  ibuprofen (ADVIL,MOTRIN) 200 MG tablet Take 200 mg by mouth every 6 (six) hours as needed for moderate pain.     [provider]  ondansetron (ZOFRAN) 8 MG tablet Take 1 tablet (8 mg total) by mouth every 8 (eight) hours as needed for nausea or vomiting. 12/21/18   Borders, Kirt Boys, NP  prednisoLONE acetate (PRED FORTE) 1 % ophthalmic suspension Place 1 drop into both eyes 4 (four) times daily. Patient not taking: Reported on 12/22/2018 11/08/18   Verlon Au,  NP     Allergies Patient has no known allergies.   Family History  Problem Relation Age of Onset  . Diabetes Mother   . Hypertension Mother   . Leukemia Father   . Thyroid disease Sister   . Liver disease Maternal Grandmother   . Heart Problems Maternal Grandfather   . Diabetes Maternal Grandfather   . Lung cancer Paternal Grandmother   . Prostate cancer Paternal Grandfather     Social History Social History   Tobacco Use  . Smoking status: Never Smoker  . Smokeless tobacco: Never Used  Substance Use Topics  . Alcohol use: No  . Drug use: No    Review of Systems  Constitutional:   No fever or chills.  ENT:   No sore throat. No rhinorrhea. Cardiovascular:   No chest pain or syncope. Respiratory:   No dyspnea or cough. Gastrointestinal:   Negative for abdominal pain, vomiting and diarrhea.  Musculoskeletal:   Negative for focal pain or swelling All other systems reviewed and are negative except as documented above in ROS and HPI.  ____________________________________________   PHYSICAL EXAM:  VITAL SIGNS: ED Triage Vitals  Enc Vitals Group     BP 12/22/18 1130 (!) 91/54     Pulse Rate 12/22/18 1112 (!) 54     Resp 12/22/18 1130 (!) 33     Temp 12/22/18 1112 98.9 F (37.2 C)     Temp Source 12/22/18 1112 Oral     SpO2 12/22/18 1112 (!) 88 %     Weight 12/22/18 1117 161 lb 13.1 oz (73.4 kg)     Height 12/22/18 1117 6' (1.829 m)     Head Circumference --      Peak Flow --      Pain Score 12/22/18 1117 10     Pain Loc --      Pain Edu? --      Excl. in [place]? --     Vital signs reviewed, nursing assessments reviewed.   Constitutional:   Alert and oriented.  Ill-appearing, very pale Eyes:   Conjunctivae are pale. EOMI. PERRL. ENT      Head:   Normocephalic and atraumatic.      Nose:   No congestion/rhinnorhea.       Mouth/Throat:   Dry mucous membranes, no pharyngeal erythema. No peritonsillar mass.       Neck:   No meningismus. Full  ROM. Hematological/Lymphatic/Immunilogical:   No cervical lymphadenopathy. Cardiovascular:   RRR. Symmetric bilateral radial and DP pulses.  No murmurs. Cap refill less than 2 seconds. Respiratory:   Normal respiratory effort without tachypnea/retractions. Breath sounds are clear and equal bilaterally. No wheezes/rales/rhonchi. Gastrointestinal:   Soft and nontender. Non distended. There is no CVA tenderness.  No rebound, rigidity, or guarding. Musculoskeletal:   Large necrotic mass in the left axilla without active bleeding.  Tender to the touch, hypervascular.  Normal range of motion in all extremities. No joint effusions.  No lower extremity  tenderness.  No edema. Neurologic:   Normal speech and language.  Motor grossly intact. No acute focal neurologic deficits are appreciated.  Skin:    Skin is warm, dry and intact. No rash noted.  No petechiae, purpura, or bullae.  ____________________________________________    LABS (pertinent positives/negatives) (all labs ordered are listed, but only abnormal results are displayed) Labs Reviewed  LACTIC ACID, PLASMA - Abnormal; Notable for the following components:      Result Value   Lactic Acid, Venous 2.1 (*)    All other components within normal limits  COMPREHENSIVE METABOLIC PANEL - Abnormal; Notable for the following components:   Sodium 132 (*)    Glucose, Bld 111 (*)    Calcium 7.1 (*)    Total Protein 4.3 (*)    Albumin 1.6 (*)    All other components within normal limits  CBC WITH DIFFERENTIAL/PLATELET - Abnormal; Notable for the following components:   WBC 22.0 (*)    RBC 2.29 (*)    Hemoglobin 5.4 (*)    HCT 17.8 (*)    MCV 77.7 (*)    MCH 23.6 (*)    RDW 18.7 (*)    Neutro Abs 20.2 (*)    Lymphs Abs 0.5 (*)    Abs Immature Granulocytes 0.20 (*)    All other components within normal limits  URINALYSIS, COMPLETE (UACMP) WITH MICROSCOPIC - Abnormal; Notable for the following components:   Color, Urine YELLOW (*)     APPearance CLEAR (*)    All other components within normal limits  CULTURE, BLOOD (ROUTINE X 2)  CULTURE, BLOOD (ROUTINE X 2)  URINE CULTURE  LIPASE, BLOOD  PROCALCITONIN  LACTIC ACID, PLASMA  PROTIME-INR  APTT  PREPARE RBC (CROSSMATCH)  TYPE AND SCREEN   ____________________________________________   EKG  Interpreted by me Sinus tachycardia rate 100, normal axis intervals QRS ST segments and T waves.  ____________________________________________    ZWCHENIDP  Dg Chest Port 1 View  Result Date: 12/22/2018 CLINICAL DATA:  Near syncope.  History of melanoma EXAM: PORTABLE CHEST 1 VIEW COMPARISON:  10/15/2017 FINDINGS: Heart size and vascularity normal. Lungs are clear without infiltrate or effusion Large left axillary mass. Embolization coils in the left axillary region. IMPRESSION: No active disease. Electronically Signed   By: Franchot Gallo M.D.   On: 12/22/2018 12:07    ____________________________________________   PROCEDURES .Critical Care Performed by: Carrie Mew, MD Authorized by: Carrie Mew, MD   Critical care provider statement:    Critical care time (minutes):  35   Critical care time was exclusive of:  Separately billable procedures and treating other patients   Critical care was necessary to treat or prevent imminent or life-threatening deterioration of the following conditions:  Sepsis and shock   Critical care was time spent personally by me on the following activities:  Development of treatment plan with patient or surrogate, discussions with consultants, evaluation of patient's response to treatment, examination of patient, obtaining history from patient or surrogate, ordering and performing treatments and interventions, ordering and review of laboratory studies, ordering and review of radiographic studies, pulse oximetry, re-evaluation of patient's condition and review of old  charts    ____________________________________________  DIFFERENTIAL DIAGNOSIS   Sepsis, cellulitis, pneumonia, pulmonary embolism  CLINICAL IMPRESSION / ASSESSMENT AND PLAN / ED COURSE  Medications ordered in the ED: Medications  metroNIDAZOLE (FLAGYL) IVPB 500 mg (0 mg Intravenous Stopped 12/22/18 1409)  vancomycin (VANCOCIN) 1,500 mg in sodium chloride 0.9 % 500 mL IVPB (1,500  mg Intravenous New Bag/Given 12/22/18 1416)  0.9 %  sodium chloride infusion (has no administration in time range)  ceFEPIme (MAXIPIME) 2 g in sodium chloride 0.9 % 100 mL IVPB (0 g Intravenous Stopped 12/22/18 1409)  iohexol (OMNIPAQUE) 350 MG/ML injection 75 mL (75 mLs Intravenous Contrast Given 12/22/18 1533)    Pertinent labs & imaging results that were available during my care of the patient were reviewed by me and considered in my medical decision making (see chart for details).    Patient presents with hypotension, weakness, syncope.  Large necrotic tumor in the left axilla.  Suspect sepsis related to soft tissue infection versus pulmonary embolism.  White blood cell count 22,000, antibiotics initiated empirically with Flagyl vancomycin and cefepime.  IV fluids given for hydration resuscitation.  Hemoglobin of 5.4 necessitates transfusion with packed red blood cells.  No obvious bleeding source, Hemoccult negative, will admit for further evaluation and management.   ----------------------------------------- 3:46 PM on 12/22/2018 -----------------------------------------  Discussed with Dr. Jerelyn Charles for further evaluation.  Given lack of infectious source, I have ordered a CT angiogram of the chest to rule out PE.  Otherwise, symptoms may be attributable to symptomatic anemia, and not sepsis at all.     ____________________________________________   FINAL CLINICAL IMPRESSION(S) / ED DIAGNOSES    Final diagnoses:  Symptomatic anemia  Leukocytosis, unspecified type  Metastatic melanoma [facility])     ED  Discharge Orders    None      Portions of this note were generated with dragon dictation software. Dictation errors may occur despite best attempts at proofreading.   Carrie Mew, MD 12/22/18 [phone]

## 2018-12-22 NOTE — ED Notes (Signed)
Family at bedside. 

## 2018-12-22 NOTE — ED Triage Notes (Signed)
S/p syncope. Walking to barroom at home  Found on ground by wide.

## 2018-12-23 ENCOUNTER — Encounter: Payer: Medicare Other | Admitting: Occupational Therapy

## 2018-12-23 DIAGNOSIS — A419 Sepsis, unspecified organism: Principal | ICD-10-CM

## 2018-12-23 DIAGNOSIS — G893 Neoplasm related pain (acute) (chronic): Secondary | ICD-10-CM

## 2018-12-23 DIAGNOSIS — C799 Secondary malignant neoplasm of unspecified site: Secondary | ICD-10-CM

## 2018-12-23 DIAGNOSIS — C439 Malignant melanoma of skin, unspecified: Secondary | ICD-10-CM

## 2018-12-23 DIAGNOSIS — D649 Anemia, unspecified: Secondary | ICD-10-CM

## 2018-12-23 LAB — CORTISOL-AM, BLOOD: Cortisol - AM: 15.3 ug/dL (ref 6.7–22.6)

## 2018-12-23 LAB — BASIC METABOLIC PANEL
Anion gap: 5 (ref 5–15)
BUN: 16 mg/dL (ref 8–23)
CO2: 21 mmol/L — ABNORMAL LOW (ref 22–32)
Calcium: 6.7 mg/dL — ABNORMAL LOW (ref 8.9–10.3)
Chloride: 104 mmol/L (ref 98–111)
Creatinine, Ser: 0.66 mg/dL (ref 0.61–1.24)
GFR calc Af Amer: >60 mL/min (ref >60)
GFR calc non Af Amer: >60 mL/min (ref >60)
Glucose, Bld: 124 mg/dL — ABNORMAL HIGH (ref 70–99)
Potassium: 3.4 mmol/L — ABNORMAL LOW (ref 3.5–5.1)
Sodium: 130 mmol/L — ABNORMAL LOW (ref 135–145)

## 2018-12-23 LAB — HEMOGLOBIN: Hemoglobin: 8.7 g/dL — ABNORMAL LOW (ref 13.0–17.0)

## 2018-12-23 LAB — URINE CULTURE: Culture: NO GROWTH

## 2018-12-23 LAB — CBC
HCT: 20.9 % — ABNORMAL LOW (ref 39.0–52.0)
Hemoglobin: 6.7 g/dL — ABNORMAL LOW (ref 13.0–17.0)
MCH: 26.1 pg (ref 26.0–34.0)
MCHC: 32.1 g/dL (ref 30.0–36.0)
MCV: 81.3 fL (ref 80.0–100.0)
Platelets: 281 10*3/uL (ref 150–400)
RBC: 2.57 MIL/uL — ABNORMAL LOW (ref 4.22–5.81)
RDW: 18.9 % — ABNORMAL HIGH (ref 11.5–15.5)
WBC: 14.7 10*3/uL — ABNORMAL HIGH (ref 4.0–10.5)
nRBC: 0 % (ref 0.0–0.2)

## 2018-12-23 LAB — PROTIME-INR
INR: 1.3 — ABNORMAL HIGH (ref 0.8–1.2)
Prothrombin Time: 15.7 seconds — ABNORMAL HIGH (ref 11.4–15.2)

## 2018-12-23 LAB — HEMOGLOBIN AND HEMATOCRIT, BLOOD
HCT: 20.7 % — ABNORMAL LOW (ref 39.0–52.0)
Hemoglobin: 6.3 g/dL — ABNORMAL LOW (ref 13.0–17.0)

## 2018-12-23 LAB — PREPARE RBC (CROSSMATCH)

## 2018-12-23 LAB — PROCALCITONIN: Procalcitonin: 0.19 ng/mL

## 2018-12-23 MED ORDER — SODIUM CHLORIDE 0.9 % IV SOLN
INTRAVENOUS | Status: AC
  Administered 2018-12-23: 18:00:00 via INTRAVENOUS

## 2018-12-23 MED ORDER — SODIUM CHLORIDE 0.9% IV SOLUTION
Freq: Once | INTRAVENOUS | Status: AC
  Administered 2018-12-23: 13:00:00 via INTRAVENOUS

## 2018-12-23 MED ORDER — POTASSIUM CHLORIDE CRYS ER 20 MEQ PO TBCR
40.0000 meq | EXTENDED_RELEASE_TABLET | Freq: Once | ORAL | Status: AC
  Administered 2018-12-23: 14:00:00 40 meq via ORAL
  Filled 2018-12-23: qty 2

## 2018-12-23 MED ORDER — ORAL CARE MOUTH RINSE: 15.0000 mL | Freq: Two times a day (BID) | OROMUCOSAL | Status: DC

## 2018-12-23 NOTE — Consult Note (Signed)
Louis Davies  [phone] [phone]  ID: Louis Davies OB: 15-Aug-1951  MR#: [id number]  VPX#:[id number]  Patient Care Team: Alvera Singh, FNP as PCP - General (Family Medicine)  CHIEF COMPLAINT: Stage IV melanoma, symptomatic anemia, possible sepsis.  INTERVAL HISTORY: Patient is a 68 year old male actively receiving treatment for stage IV melanoma who was evaluated the emergency room and found to have profound anemia along with hypotension and fever concern for underlying sepsis.  He feels improved since admission, but not back to baseline.  He previously was dizzy, but this has resolved and he has no further neurologic complaints.  He continues to have decreased performance status and poor appetite.  He denies any further fevers.  He has no chest pain or shortness of breath.  He denies any nausea, vomiting, constipation, or diarrhea.  He has no urinary complaints.  Patient offers no further specific complaints today.  REVIEW OF SYSTEMS:   Review of Systems  Constitutional: Positive for malaise/fatigue. Negative for fever and weight loss.  Respiratory: Negative.  Negative for cough, hemoptysis and shortness of breath.   Cardiovascular: Negative.  Negative for chest pain and leg swelling.  Gastrointestinal: Negative.  Negative for abdominal pain.  Genitourinary: Negative.  Negative for dysuria.  Musculoskeletal: Negative.  Negative for back pain.  Skin: Negative.  Negative for rash.  Neurological: Positive for weakness. Negative for dizziness, focal weakness and headaches.  Psychiatric/Behavioral: Negative.  The patient is not nervous/anxious.     As per HPI. Otherwise, a complete review of systems is negative.  PAST MEDICAL HISTORY: Past Medical History:  Diagnosis Date  . Anemia   . Arthritis   . Cancer ([place])    MELANOMA  . Complication of anesthesia   . Dyspnea    doe  . Perforated viscus   . PONV (postoperative nausea and  vomiting)     PAST SURGICAL HISTORY: Past Surgical History:  Procedure Laterality Date  . AXILLARY LYMPH NODE DISSECTION Left 06/26/2017   Procedure: EXCISION OF LEFT AXILLARY MASS;  Surgeon: Johnathan Hausen, MD;  Location: [facility] OR[facility];  Service: General;  Laterality: Left;  . AXILLARY LYMPH NODE DISSECTION Left 11/01/2018   Procedure: AXILLARY DEBRIDEMENT;  Surgeon: Vickie Epley, MD;  Location: [facility] OR[facility];  Service: General;  Laterality: Left;  . AXILLARY LYMPH NODE DISSECTION Left 12/06/2018   Procedure: PALLIATIVE DEBULKING OF AXILLARY TUMOR-LEFT;  Surgeon: Vickie Epley, MD;  Location: [facility];  Service: General;  Laterality: Left;  . DIAGNOSTIC LAPAROSCOPY    . EMBOLIZATION Left 05/11/2018   Procedure: EMBOLIZATION;  Surgeon: Katha Cabal, MD;  Location: [place] [facility];  Service: Cardiovascular;  Laterality: Left;  . LAPAROTOMY N/A 10/14/2017   Procedure: EXPLORATORY LAPAROTOMY;  Surgeon: Clayburn Pert, MD;  Location: [facility];  Service: General;  Laterality: N/A;  . VASECTOMY    . WOUND DEBRIDEMENT Left 09/24/2018   Procedure: LEFT AXILLARY EXCISIONAL DEBRIDEMENT OF NECROTIC TISSUE;  Surgeon: Vickie Epley, MD;  Location: [facility];  Service: General;  Laterality: Left;    FAMILY HISTORY: Family History  Problem Relation Age of Onset  . Diabetes Mother   . Hypertension Mother   . Leukemia Father   . Thyroid disease Sister   . Liver disease Maternal Grandmother   . Heart Problems Maternal Grandfather   . Diabetes Maternal Grandfather   . Lung cancer Paternal Grandmother   . Prostate cancer Paternal Grandfather     ADVANCED DIRECTIVES (Y/N):  @ADVDIR @  HEALTH MAINTENANCE: Social  History   Tobacco Use  . Smoking status: Never Smoker  . Smokeless tobacco: Never Used  Substance Use Topics  . Alcohol use: No  . Drug use: No     Colonoscopy:  PAP:  Bone density:  Lipid panel:  No Known Allergies  Current Facility-Administered Medications    Medication Dose Route Frequency Provider Last Rate Last Dose  . 0.9 %  sodium chloride infusion (Manually program via Guardrails IV Fluids)   Intravenous Once Mody, Sital, MD      . 0.9 %  sodium chloride infusion   Intravenous PRN Salary, Montell D, MD 10 mL/hr at 12/23/18 0445 1,000 mL at 12/23/18 0445  . 0.9 %  sodium chloride infusion   Intravenous Continuous Mody, Sital, MD      . albuterol (PROVENTIL) (2.5 MG/3ML) 0.083% nebulizer solution 2.5 mg  2.5 mg Nebulization Q6H PRN Salary, Montell D, MD      . Binimetinib TABS 15 mg  15 mg Oral BID Gorden Harms, MD   Stopped at 12/22/18 2224  . ceFEPIme (MAXIPIME) 2 g in sodium chloride 0.9 % 100 mL IVPB  2 g Intravenous Q8H Rocky Morel, RPH 200 mL/hr at 12/23/18 0614 2 g at 12/23/18 4132  . docusate sodium (COLACE) capsule 100 mg  100 mg Oral BID Loney Hering D, MD   100 mg at 12/22/18 2233  . Encorafenib CAPS 225 mg  225 mg Oral Daily Salary, Avel Peace, MD   Stopped at 12/23/18 [phone]  . enoxaparin (LOVENOX) injection 40 mg  40 mg Subcutaneous Q24H Salary, Montell D, MD      . fentaNYL (DURAGESIC) 25 MCG/HR 1 patch  1 patch Transdermal Q72H Salary, Montell D, MD      . ibuprofen (ADVIL,MOTRIN) tablet 200 mg  200 mg Oral Q6H PRN Salary, Montell D, MD   200 mg at 12/23/18 0434  . MEDLINE mouth rinse  15 mL Mouth Rinse BID Salary, Montell D, MD      . metroNIDAZOLE (FLAGYL) IVPB 500 mg  500 mg Intravenous Vida Roller, MD 100 mL/hr at 12/23/18 0445 500 mg at 12/23/18 0445  . morphine 2 MG/ML injection 2 mg  2 mg Intravenous Q2H PRN Salary, Montell D, MD      . multivitamin with minerals tablet 1 tablet  1 tablet Oral Daily Salary, Montell D, MD      . ondansetron (ZOFRAN) tablet 8 mg  8 mg Oral Q8H PRN Salary, Montell D, MD   8 mg at 12/23/18 0949  . oxyCODONE (Oxy IR/ROXICODONE) immediate release tablet 10 mg  10 mg Oral Q4H PRN Salary, Holly Bodily D, MD   10 mg at 12/23/18 0949  . polyethylene glycol (MIRALAX / GLYCOLAX) packet 17 g   17 g Oral Daily PRN Salary, Montell D, MD      . potassium chloride SA (K-DUR,KLOR-CON) CR tablet 40 mEq  40 mEq Oral Once Bettey Costa, MD      . promethazine (PHENERGAN) tablet 12.5 mg  12.5 mg Oral Q6H PRN Salary, Montell D, MD      . vancomycin (VANCOCIN) IVPB 1000 mg/200 mL premix  1,000 mg Intravenous Q12H Rocky Morel, RPH   Stopped at 12/23/18 0341    OBJECTIVE: Vitals:   12/23/18 0700 12/23/18 0948  BP: (!) 86/51 91/60  Pulse: 70 68  Resp: (!) 24 18  Temp: 97.9 F (36.6 C) (!) 97.4 F (36.3 C)  SpO2: 99% 100%     Body mass index  is 21.95 kg/m.    ECOG FS:2 - Symptomatic, <50% confined to bed  General: Ill-appearing, no acute distress. Eyes: Pink conjunctiva, anicteric sclera. HEENT: Normocephalic, moist mucous membranes, clear oropharnyx. Lungs: Clear to auscultation bilaterally. Heart: Regular rate and rhythm. No rubs, murmurs, or gallops. Abdomen: Soft, nontender, nondistended. No organomegaly noted, normoactive bowel sounds. Musculoskeletal: Surgical dressing on large axillary mass. Neuro: Alert, answering all questions appropriately. Cranial nerves grossly intact. Skin: No rashes or petechiae noted. Psych: Normal affect.  LAB RESULTS:  Lab Results  Component Value Date   NA 130 (L) 12/23/2018   K 3.4 (L) 12/23/2018   CL 104 12/23/2018   CO2 21 (L) 12/23/2018   GLUCOSE 124 (H) 12/23/2018   BUN 16 12/23/2018   CREATININE 0.66 12/23/2018   CALCIUM 6.7 (L) 12/23/2018   PROT 4.3 (L) 12/22/2018   ALBUMIN 1.6 (L) 12/22/2018   AST 23 12/22/2018   ALT 19 12/22/2018   ALKPHOS 63 12/22/2018   BILITOT 0.3 12/22/2018   GFRNONAA >60 12/23/2018   GFRAA >60 12/23/2018    Lab Results  Component Value Date   WBC 14.7 (H) 12/23/2018   NEUTROABS 20.2 (H) 12/22/2018   HGB 6.7 (L) 12/23/2018   HCT 20.9 (L) 12/23/2018   MCV 81.3 12/23/2018   PLT 281 12/23/2018     STUDIES: Ct Angio Chest Pe W And/or Wo Contrast  Result Date: 12/22/2018 EXAM: CT ANGIOGRAPHY  CHEST WITH CONTRAST TECHNIQUE: Multidetector CT imaging of the chest was performed using the standard protocol during bolus administration of intravenous contrast. Multiplanar CT image reconstructions and MIPs were obtained to evaluate the vascular anatomy. CONTRAST:  83mL OMNIPAQUE IOHEXOL 350 MG/ML SOLN COMPARISON:  PET-CT 10/22/2018 and previous FINDINGS: Cardiovascular: Heart size normal. No pericardial effusion. Dilated central pulmonary arteries. Satisfactory opacification of pulmonary arteries noted, and there is no evidence of pulmonary emboli. Adequate contrast opacification of the thoracic aorta with no evidence of dissection, aneurysm, or stenosis. There is classic 3-vessel brachiocephalic arch anatomy without proximal stenosis. No significant atheromatous plaque. Mediastinum/Nodes: No hilar or mediastinal adenopathy. Lungs/Pleura: Trace bilateral pleural effusions. No pneumothorax. Dependent atelectasis posteriorly in both lower lobes. Calcified left lower lobe granuloma. 3 mm nonspecific nodules in the right lower lobe image 56/6 and right upper lobe image 52/6. Upper Abdomen: Stable hepatic segment 4A probable cyst. No acute findings. Musculoskeletal: Significant interval enlargement of left axillary mass no measuring at least 19.3 cm, incompletely visualized. Stable adjacent embolization coils. Anterior vertebral endplate spurring at multiple levels in the mid and lower thoracic spine. No fracture or worrisome bone lesion. Review of the MIP images confirms the above findings. IMPRESSION: 1. Negative for acute PE or thoracic aortic dissection. 2. Trace bilateral pleural effusions. 3. Interval enlargement of left axillary mass, incompletely visualized. Electronically Signed   By: Lucrezia Europe M.D.   On: 12/22/2018 15:59   Dg Chest Port 1 View  Result Date: 12/22/2018 CLINICAL DATA:  Near syncope.  History of melanoma EXAM: PORTABLE CHEST 1 VIEW COMPARISON:  10/15/2017 FINDINGS: Heart size and  vascularity normal. Lungs are clear without infiltrate or effusion Large left axillary mass. Embolization coils in the left axillary region. IMPRESSION: No active disease. Electronically Signed   By: Franchot Gallo M.D.   On: 12/22/2018 12:07    ASSESSMENT: Stage IV melanoma, symptomatic anemia, possible sepsis.  PLAN:    1.  Stage IV melanoma: PET scan results of October 22, 2018 revealed a worsening left axillary mass, but only mild evidence of systemic disease.  Patient currently is taking encorafenib 300 mg daily plus binimetinib 30 mg daily.  He had a surgical opinion at [facility] earlier this week and patient states he is likely proceeding with left arm amputation for further control of disease.  His follow-up appointment is on December 29, 2018 and he has been instructed to keep this appointment as scheduled.  Patient can follow-up in the cancer center several days after for further discussion of treatment and care coordination. 2.  Anemia: Likely secondary to blood loss from left axillary lesion.  Patient's hemoglobin has improved to 6.7.  Agree with additional transfusion today. 3.  Sepsis: Blood cultures are negative to date, but patient likely had bacteremia from his left axillary wound.  Continue current antibiotics. 4.  Pain/symptom management: Have consulted palliative care.  Appreciate consult, will follow.   Cancer Staging Metastatic melanoma [facility]) Staging form: Melanoma of the Skin, AJCC 8th Edition - Clinical stage from 07/15/2017: Stage IV (cTX, cN1, cM1c(0)) - Signed by Lloyd Huger, MD on 07/15/2017   Lloyd Huger, MD   12/23/2018 12:15 PM

## 2018-12-23 NOTE — Plan of Care (Signed)
  Problem: Education: Goal: Knowledge of General Education information will improve Description Including pain rating scale, medication(s)/side effects and non-pharmacologic comfort measures Outcome: Progressing   Problem: Health Behavior/Discharge Planning: Goal: Ability to manage health-related needs will improve Outcome: Progressing   Problem: Clinical Measurements: Goal: Ability to maintain clinical measurements within normal limits will improve Outcome: Progressing Goal: Will remain free from infection Outcome: Progressing Goal: Diagnostic test results will improve Outcome: Progressing Goal: Respiratory complications will improve Outcome: Progressing Goal: Cardiovascular complication will be avoided Outcome: Progressing   Problem: Activity: Goal: Risk for activity intolerance will decrease Outcome: Progressing   Problem: Nutrition: Goal: Adequate nutrition will be maintained Outcome: Progressing   Problem: Coping: Goal: Level of anxiety will decrease Outcome: Progressing   Problem: Elimination: Goal: Will not experience complications related to bowel motility Outcome: Progressing Goal: Will not experience complications related to urinary retention Outcome: Progressing   Problem: Pain Managment: Goal: General experience of comfort will improve Outcome: Progressing   Problem: Safety: Goal: Ability to remain free from injury will improve Outcome: Progressing   Problem: Skin Integrity: Goal: Risk for impaired skin integrity will decrease Outcome: Not Progressing  Necrotic tumor on left axillary, posterior and anterior.

## 2018-12-23 NOTE — Progress Notes (Signed)
Dr Benjie Karvonen requested to order Hgb post blood transfusion.  Per MD if Hgb < 8.0 to order 1xunit of RBC's to be given over 4hrs. Orders entered by this Probation officer.

## 2018-12-23 NOTE — Consult Note (Addendum)
SURGICAL CONSULTATION NOTE (initial) - cpt: 27062  Patient seen and examined as described below with surgical PA-C, Ardell Isaacs.  Assessment/Plan: (ICD-10's: L98.493, I15, C43.9) 68 y.o. male well known to myself and Dr. 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   - pain control prn  - encourage nutrition (regular diet)  - monitor Hgb and transfuse as needed to goal Hb >8 or >9  - continue to monitor / trend decreasing leukocytosis towards normal prior to discharge  - antibiotics as per primary team, though same chronic foul smell has previously been consistent with extensive Left axillary tumor necrosis rather than infection, and his wound appears essentially unchanged from prior regrowth  - no indication for emergent surgical intervention at this time and no further surgical options able to be personally offered, though upon stabilization of Hb (anticipate WBC to resolve accordingly), outpatient follow-up with Dr. Freddi Che at [facility] for reportedly pending LUE disarticulation with fore quarter amputation following scheduled upcoming corresponding MRI to evaluate for chest wall involvement  - medical management per primary medical and oncology teams (discussed with Dr. Grayland Ormond and Dr. Benjie Karvonen)   - please call if any questions or concerns, but will follow peripherally/socially and available as needed  - patient request for CT imaging studies on CD upon discharge discussed with patient's RN  I have personally reviewed the patient's chart, evaluated/examined the patient, proposed the recommended management, and personally discussed these  recommendations with the patient, his daughter, Dr. Grayland Ormond, Dr. Benjie Karvonen, and patient's RN, and all of patient's and his daughter's questions were answered to their expressed satisfaction.  Thank you for the opportunity to participate in this patient's care.  -- Marilynne Drivers Rosana Hoes, MD, [place]: [facility] - Partnering for exceptional care. Office: [phone]  [facility] SURGICAL CONSULTATION NOTE (initial) - cpt: 99254  HISTORY OF PRESENT ILLNESS (HPI):  68 y.o. male presented to [facility] ED 03/04 for evaluation of syncope. Patient reports that he was going to the bathroom yesterday when he got light headed and believes he passed out. He is not able to recall much from this episode. According to the patient, he believes the EMS reported he was hypotensive and he was found to be the same on arrival to the ED. He denied any associated fevers, chills, chest pain, abdominal pain, nausea, or emesis. No bloody stools. He denied SOB but is requiring 5L of O2 via [place] today. He has a history of metastatic melanoma on chemotherapy ([name]) and has been following with Dr Rosana Hoes for palliative bulky debridements of his left axilla, last on 02/17. Following his last debridement it was felt that there were no further surgical interventions that could be offered here. He was seen by a surgical oncologist (Dr Freddi Che) at [facility] and they felt his only surgical options are left shoulder disarticulation and fore quarter amputation, although this was pending further imaging evaluation. He was due to follow up on 03/11. Work up in the ED was concerning for septic shock and symptomatic anemia felt to be as a result of his bulky left axilla necrotic mass.   Surgery is consulted by hospitalist physician Dr. Jerelyn Charles, MD in this context for evaluation and management of septic shock and anemia from  his left axillary necrotic mass.   PAST MEDICAL HISTORY (PMH):  Past Medical  History:  Diagnosis Date  . Anemia   . Arthritis   . Cancer ([place])    MELANOMA  . Complication of anesthesia   . Dyspnea    doe  . Perforated viscus   . PONV (postoperative nausea and vomiting)     PAST SURGICAL HISTORY ([place]):  Past Surgical History:  Procedure Laterality Date  . AXILLARY LYMPH NODE DISSECTION Left 06/26/2017   Procedure: EXCISION OF LEFT AXILLARY MASS;  Surgeon: Johnathan Hausen, MD;  Location: [facility] ORS;  Service: General;  Laterality: Left;  . AXILLARY LYMPH NODE DISSECTION Left 11/01/2018   Procedure: AXILLARY DEBRIDEMENT;  Surgeon: Vickie Epley, MD;  Location: [facility] ORS;  Service: General;  Laterality: Left;  . AXILLARY LYMPH NODE DISSECTION Left 12/06/2018   Procedure: PALLIATIVE DEBULKING OF AXILLARY TUMOR-LEFT;  Surgeon: Vickie Epley, MD;  Location: [facility] ORS;  Service: General;  Laterality: Left;  . DIAGNOSTIC LAPAROSCOPY    . EMBOLIZATION Left 05/11/2018   Procedure: EMBOLIZATION;  Surgeon: Katha Cabal, MD;  Location: [place] [facility];  Service: Cardiovascular;  Laterality: Left;  . LAPAROTOMY N/A 10/14/2017   Procedure: EXPLORATORY LAPAROTOMY;  Surgeon: Clayburn Pert, MD;  Location: [facility] ORS;  Service: General;  Laterality: N/A;  . VASECTOMY    . WOUND DEBRIDEMENT Left 09/24/2018   Procedure: LEFT AXILLARY EXCISIONAL DEBRIDEMENT OF NECROTIC TISSUE;  Surgeon: Vickie Epley, MD;  Location: [facility] ORS;  Service: General;  Laterality: Left;    MEDICATIONS:  Prior to Admission medications   Medication Sig Start Date End Date Taking? Authorizing Provider  Binimetinib (MEKTOVI) 15 MG TABS Take 45 mg by mouth 2 (two) times daily. Patient taking differently: Take 15 mg by mouth 2 (two) times daily.  11/01/18  Yes Lloyd Huger, MD  Encorafenib 75 MG CAPS Take 450 mg by mouth daily. Patient taking differently: Take 225 mg by mouth daily.  11/01/18  Yes Lloyd Huger, MD  fentaNYL (DURAGESIC) 25 MCG/HR Place 1 patch onto the skin every 3  (three) days. 12/14/18  Yes Lloyd Huger, MD  Multiple Vitamin (MULTIVITAMIN) tablet Take 1 tablet by mouth daily.   Yes [provider]  Oxycodone HCl 10 MG TABS Take 1 tablet (10 mg total) by mouth every 4 (four) hours as needed. 12/14/18  Yes Borders, Kirt Boys, NP  ibuprofen (ADVIL,MOTRIN) 200 MG tablet Take 200 mg by mouth every 6 (six) hours as needed for moderate pain.     [provider]  ondansetron (ZOFRAN) 8 MG tablet Take 1 tablet (8 mg total) by mouth every 8 (eight) hours as needed for nausea or vomiting. 12/21/18   Borders, Kirt Boys, NP    ALLERGIES:  No Known Allergies   SOCIAL HISTORY:  Social History   Socioeconomic History  . Marital status: Divorced    Spouse name: Not on file  . Number of children: Not on file  . Years of education: Not on file  . Highest education level: Not on file  Occupational History  . Not on file  Social Needs  . Financial resource strain: Not on file  . Food insecurity:    Worry: Not on file    Inability: Not on file  . Transportation needs:    Medical: Not on file    Non-medical: Not on file  Tobacco Use  . Smoking status: Never Smoker  . Smokeless tobacco: Never Used  Substance and  Sexual Activity  . Alcohol use: No  . Drug use: No  . Sexual activity: Yes  Lifestyle  . Physical activity:    Days per week: Not on file    Minutes per session: Not on file  . Stress: Not on file  Relationships  . Social connections:    Talks on phone: Not on file    Gets together: Not on file    Attends religious service: Not on file    Active member of club or organization: Not on file    Attends meetings of clubs or organizations: Not on file    Relationship status: Not on file  . Intimate partner violence:    Fear of current or ex partner: Not on file    Emotionally abused: Not on file    Physically abused: Not on file    Forced sexual activity: Not on file  Other Topics Concern  . Not on file  Social History  Narrative  . Not on file    FAMILY HISTORY:  Family History  Problem Relation Age of Onset  . Diabetes Mother   . Hypertension Mother   . Leukemia Father   . Thyroid disease Sister   . Liver disease Maternal Grandmother   . Heart Problems Maternal Grandfather   . Diabetes Maternal Grandfather   . Lung cancer Paternal Grandmother   . Prostate cancer Paternal Grandfather     REVIEW OF SYSTEMS:  Review of Systems  Constitutional: Positive for malaise/fatigue. Negative for chills and fever.  Respiratory: Positive for shortness of breath. Negative for cough.   Cardiovascular: Negative for chest pain and palpitations.  Gastrointestinal: Negative for abdominal pain, nausea and vomiting.  Genitourinary: Negative for dysuria and urgency.  Skin:       + Left Axillary Necrotic Mass  Neurological: Negative for dizziness and headaches.  All other systems reviewed and are negative.  VITAL SIGNS:  Temp:  [97.9 F (36.6 C)-99.6 F (37.6 C)] 97.9 F (36.6 C) (03/05 0700) Pulse Rate:  [54-96] 70 (03/05 0700) Resp:  [14-34] 24 (03/05 0700) BP: (79-103)/(45-62) 86/51 (03/05 0700) SpO2:  [83 %-100 %] 99 % (03/05 0700) Weight:  [73.4 kg] 73.4 kg (03/04 1117)     Height: 6' (182.9 cm) Weight: 73.4 kg BMI (Calculated): 21.94   INTAKE/OUTPUT:  This shift: No intake/output data recorded.  Last 2 shifts: @IOLAST2SHIFTS @   PHYSICAL EXAM:  Physical Exam Constitutional:      General: He is not in acute distress.    Appearance: He is cachectic. He is ill-appearing.     Interventions: Nasal cannula in place.  HENT:     Head: Normocephalic and atraumatic.  Eyes:     General: No scleral icterus. Cardiovascular:     Rate and Rhythm: Normal rate and regular rhythm.     Pulses: Normal pulses.     Heart sounds: No murmur. No friction rub. No gallop.   Pulmonary:     Effort: No respiratory distress.     Breath sounds: No wheezing or rhonchi.  Abdominal:     General: Abdomen is flat. There is no  distension.     Tenderness: There is no abdominal tenderness.  Genitourinary:    Comments: Deferred Musculoskeletal:     Right lower leg: No edema.     Left lower leg: No edema.  Skin:    General: Skin is dry.     Coloration: Skin is pale.       Neurological:     General:  No focal deficit present.     Mental Status: He is alert and oriented to person, place, and time.  Psychiatric:        Mood and Affect: Mood normal.        Behavior: Behavior normal.    Labs:  CBC Latest Ref Rng & Units 12/23/2018 12/23/2018 12/22/2018  WBC 4.0 - 10.5 K/uL 14.7(H) - 22.0(H)  Hemoglobin 13.0 - 17.0 g/dL 6.7(L) 6.3(L) 5.4(L)  Hematocrit 39.0 - 52.0 % 20.9(L) 20.7(L) 17.8(L)  Platelets 150 - 400 K/uL 281 - 358   CMP Latest Ref Rng & Units 12/23/2018 12/22/2018 12/14/2018  Glucose 70 - 99 mg/dL 124(H) 111(H) 104(H)  BUN 8 - 23 mg/dL 16 22 23   Creatinine 0.61 - 1.24 mg/dL 0.66 0.87 0.94  Sodium 135 - 145 mmol/L 130(L) 132(L) 136  Potassium 3.5 - 5.1 mmol/L 3.4(L) 4.1 3.8  Chloride 98 - 111 mmol/L 104 99 105  CO2 22 - 32 mmol/L 21(L) 22 25  Calcium 8.9 - 10.3 mg/dL 6.7(L) 7.1(L) 7.5(L)  Total Protein 6.5 - 8.1 g/dL - 4.3(L) 4.7(L)  Total Bilirubin 0.3 - 1.2 mg/dL - 0.3 0.3  Alkaline Phos 38 - 126 U/L - 63 38  AST 15 - 41 U/L - 23 11(L)  ALT 0 - 44 U/L - 19 10   Imaging studies:  CTA Chest (12/22/2018) personally reviewed and discussed with patient and his daughter:  1. Negative for acute PE or thoracic aortic dissection. 2. Trace bilateral pleural effusions. 3. Interval enlargement of left axillary mass, incompletely visualized.  Assessment/Plan: (ICD-10's: L98.493, I21, C43.9) 68 y.o. male well known to Dr. Rosana Hoes with profound symptomatic anemia and hemorrhagic hypovolemic shock attributed to his large bulky and chronically oozing necrotic left axillary metastasis of malignant melanoma, responsive to systemic chemotherapy except at Left axillary site refractory to treatments s/p multiple  palliative-only tumor debulking/resections, complicated by decreasing leukocytosis mostly likely attributable to same.   - Pain control prn  - Encourage nutrition (regular diet)  - Monitor Hgb and transfuse as needed to goal Hb >8 or >9  - Continue to monitor / trend decreasing leukocytosis towards normal prior to discharge  - Antibiotics as per primary team, though same chronic foul smell has previously been consistent with extensive Left axillary tumor necrosis rather than infection, and his wound appears essentially unchanged from prior regrowth  - No indication for emergent surgical intervention at this timeand no further surgical options able to be personally offered, though upon stabilization of Hb (anticipate WBC to resolve accordingly), outpatient follow-up with Dr. Freddi Che at [facility] for reportedly pending LUE disarticulation with fore quarter amputation following scheduled upcoming corresponding MRI to evaluate for chest wall involvement  - Medical management per primary medical and oncology teams (discussed with Dr. Grayland Ormond and Dr. Benjie Karvonen)   - Please call if any questions or concerns, but will follow peripherally/socially and available as needed  - Patient request for CT imaging studies on CD upon discharge discussed with patient's RN  All of the above findings and recommendations were discussed with the patient, his daughter, Dr. Grayland Ormond, and Dr. Benjie Karvonen, and all of patient's and his family's questions were answered to their expressed satisfaction.  Thank you for the opportunity to participate in this patient's care.   -- Edison Simon, PA-C [facility] 12/23/2018, 9:14 AM [phone] M-F: 7am - 4pm

## 2018-12-23 NOTE — Progress Notes (Signed)
[facility] NAME: Louis Davies    MR#:  [id number]  DATE OF BIRTH:  1950/11/25  SUBJECTIVE:   patient with nausea this am and low BP  REVIEW OF SYSTEMS:    Review of Systems  Constitutional:+for fever, no chills weight loss + Generalized weakness HENT: Negative for ear pain, nosebleeds, congestion, facial swelling, rhinorrhea, neck pain, neck stiffness and ear discharge.   Respiratory: Negative for cough, shortness of breath, wheezing  Cardiovascular: Negative for chest pain, palpitations and leg swelling.  Gastrointestinal: Negative for heartburn, abdominal pain, vomiting, diarrhea or consitpation + Nausea Genitourinary: Negative for dysuria, urgency, frequency, hematuria Musculoskeletal: Negative for back pain or joint pain Neurological: Negative for dizziness, seizures, syncope, focal weakness,  numbness and headaches.  Hematological: Does not bruise/bleed easily.  Psychiatric/Behavioral: Negative for hallucinations, confusion, dysphoric mood    Tolerating Diet: no      DRUG ALLERGIES:  No Known Allergies  VITALS:  Blood pressure 91/60, pulse 68, temperature (!) 97.4 F (36.3 C), temperature source Oral, resp. rate 18, height 6' (1.829 m), weight 73.4 kg, SpO2 100 %.  PHYSICAL EXAMINATION:  Constitutional: Appears chronically ill-appearing no distress. HENT: Normocephalic. Marland Kitchen Oropharynx is clear and moist.  Eyes: Conjunctivae and EOM are normal. PERRLA, no scleral icterus.  Neck: Normal ROM. Neck supple. No JVD. No tracheal deviation. CVS: RRR, S1/S2 +, no murmurs, no gallops, no carotid bruit.  Pulmonary: Effort and breath sounds normal, no stridor, rhonchi, wheezes, rales.  Abdominal: Soft. BS +,  no distension, tenderness, rebound or guarding.  Musculoskeletal: Normal range of motion. No edema and no tenderness.  Neuro: Alert. CN 2-12 grossly intact. No focal deficits. Skin: Bulky mass to the left axillary  region grossly necrotic and foul-smelling Psychiatric: Normal mood and affect.      LABORATORY PANEL:   CBC Recent Labs  Lab 12/23/18 0845  WBC 14.7*  HGB 6.7*  HCT 20.9*  PLT 281   ------------------------------------------------------------------------------------------------------------------  Chemistries  Recent Labs  Lab 12/22/18 1133 12/23/18 0845  NA 132* 130*  K 4.1 3.4*  CL 99 104  CO2 22 21*  GLUCOSE 111* 124*  BUN 22 16  CREATININE 0.87 0.66  CALCIUM 7.1* 6.7*  AST 23  --   ALT 19  --   ALKPHOS 63  --   BILITOT 0.3  --    ------------------------------------------------------------------------------------------------------------------  Cardiac Enzymes No results for input(s): TROPONINI in the last 168 hours. ------------------------------------------------------------------------------------------------------------------  RADIOLOGY:  Ct Angio Chest Pe W And/or Wo Contrast  Result Date: 12/22/2018 EXAM: CT ANGIOGRAPHY CHEST WITH CONTRAST TECHNIQUE: Multidetector CT imaging of the chest was performed using the standard protocol during bolus administration of intravenous contrast. Multiplanar CT image reconstructions and MIPs were obtained to evaluate the vascular anatomy. CONTRAST:  83mL OMNIPAQUE IOHEXOL 350 MG/ML SOLN COMPARISON:  PET-CT 10/22/2018 and previous FINDINGS: Cardiovascular: Heart size normal. No pericardial effusion. Dilated central pulmonary arteries. Satisfactory opacification of pulmonary arteries noted, and there is no evidence of pulmonary emboli. Adequate contrast opacification of the thoracic aorta with no evidence of dissection, aneurysm, or stenosis. There is classic 3-vessel brachiocephalic arch anatomy without proximal stenosis. No significant atheromatous plaque. Mediastinum/Nodes: No hilar or mediastinal adenopathy. Lungs/Pleura: Trace bilateral pleural effusions. No pneumothorax. Dependent atelectasis posteriorly in both lower lobes.  Calcified left lower lobe granuloma. 3 mm nonspecific nodules in the right lower lobe image 56/6 and right upper lobe image 52/6. Upper Abdomen: Stable hepatic segment 4A probable cyst. No acute  findings. Musculoskeletal: Significant interval enlargement of left axillary mass no measuring at least 19.3 cm, incompletely visualized. Stable adjacent embolization coils. Anterior vertebral endplate spurring at multiple levels in the mid and lower thoracic spine. No fracture or worrisome bone lesion. Review of the MIP images confirms the above findings. IMPRESSION: 1. Negative for acute PE or thoracic aortic dissection. 2. Trace bilateral pleural effusions. 3. Interval enlargement of left axillary mass, incompletely visualized. Electronically Signed   By: Lucrezia Europe M.D.   On: 12/22/2018 15:59   Dg Chest Port 1 View  Result Date: 12/22/2018 CLINICAL DATA:  Near syncope.  History of melanoma EXAM: PORTABLE CHEST 1 VIEW COMPARISON:  10/15/2017 FINDINGS: Heart size and vascularity normal. Lungs are clear without infiltrate or effusion Large left axillary mass. Embolization coils in the left axillary region. IMPRESSION: No active disease. Electronically Signed   By: Franchot Gallo M.D.   On: 12/22/2018 12:07     ASSESSMENT AND PLAN:    68 year old male with history of large left axiallary necrotic mass status post several debridements and metastatic melanoma on chemotherapy who presents due to analyzed weakness and near syncope.  1.  Sepsis: Patient presents with fever, leukocytosis, tachypnea.  Sepsis is due to left axillary lesion. Surgery has been consulted Continue aggressive antibiotics including cefepime, Flagyl and vancomycin.  2.  Hyponatremia: This is from poor p.o. intake Change fluids to normal saline  3.  Acute on chronic anemia: Patient status post unit PRBC.  Hemoglobin this morning 6.7. Transfuse 1 more unit  4.  Metastatic melanoma on chemotherapy: Oncology evaluation pending  5.   Hypokalemia: Replete and recheck in a.m. 6.  Large axillary necrotic mass: Surgery evaluation for debridement and patient has outpatient follow up next week with [facility]    7.  Protein calorie malnutrition: Dietary consulted  Management plans discussed with the patient and he is in agreement.  CODE STATUS: full  TOTAL TIME TAKING CARE OF THIS PATIENT: 30 minutes.     POSSIBLE D/C 2-3 days, DEPENDING ON CLINICAL CONDITION.   Shaylea Ucci M.D on 12/23/2018 at 10:33 AM  Between 7am to 6pm - Pager - [phone] After 6pm go to [email or web address] - password [email or web address] [facility]  Office  [phone]  CC: Primary care physician; Alvera Singh, FNP  Note: This dictation was prepared with Dragon dictation along with smaller phrase technology. Any transcriptional errors that result from this process are unintentional.

## 2018-12-23 NOTE — Plan of Care (Addendum)
SBP remained in the 80's. Rest of VSS. IVF infusing.  1xunit of RBC's transfused.  No transfusion reaction occurred.  Dressing to L axilla changed per order.

## 2018-12-24 ENCOUNTER — Ambulatory Visit: Payer: Medicare Other | Admitting: Occupational Therapy

## 2018-12-24 LAB — BASIC METABOLIC PANEL
Anion gap: 6 (ref 5–15)
BUN: 19 mg/dL (ref 8–23)
CO2: 21 mmol/L — ABNORMAL LOW (ref 22–32)
Calcium: 6.8 mg/dL — ABNORMAL LOW (ref 8.9–10.3)
Chloride: 104 mmol/L (ref 98–111)
Creatinine, Ser: 0.64 mg/dL (ref 0.61–1.24)
GFR calc Af Amer: >60 mL/min (ref >60)
GFR calc non Af Amer: >60 mL/min (ref >60)
Glucose, Bld: 108 mg/dL — ABNORMAL HIGH (ref 70–99)
Potassium: 4 mmol/L (ref 3.5–5.1)
Sodium: 131 mmol/L — ABNORMAL LOW (ref 135–145)

## 2018-12-24 LAB — CBC
HCT: 24.5 % — ABNORMAL LOW (ref 39.0–52.0)
Hemoglobin: 7.8 g/dL — ABNORMAL LOW (ref 13.0–17.0)
MCH: 25.7 pg — AB (ref 26.0–34.0)
MCHC: 31.8 g/dL (ref 30.0–36.0)
MCV: 80.9 fL (ref 80.0–100.0)
PLATELETS: 327 10*3/uL (ref 150–400)
RBC: 3.03 MIL/uL — ABNORMAL LOW (ref 4.22–5.81)
RDW: 18.4 % — ABNORMAL HIGH (ref 11.5–15.5)
WBC: 17.6 10*3/uL — ABNORMAL HIGH (ref 4.0–10.5)
nRBC: 0 % (ref 0.0–0.2)

## 2018-12-24 LAB — PREPARE RBC (CROSSMATCH)

## 2018-12-24 MED ORDER — SODIUM CHLORIDE 0.9% IV SOLUTION
Freq: Once | INTRAVENOUS | Status: AC
  Administered 2018-12-24: 10:00:00 via INTRAVENOUS

## 2018-12-24 MED ORDER — FENTANYL 50 MCG/HR TD PT72
1.0000 | MEDICATED_PATCH | TRANSDERMAL | Status: DC
  Administered 2018-12-24: 13:00:00 1 via TRANSDERMAL
  Filled 2018-12-24: qty 1

## 2018-12-24 NOTE — Evaluation (Signed)
Physical Therapy Evaluation Patient Details Name: Louis Davies MRN: [id number] DOB: 1950-12-28 Today's Date: 12/24/2018   History of Present Illness  Pt is a 68 year old male admitted with sepsis following c/o LOC at home.  PMH includes current metastatic melanoma (large tumor L UE), arthritis and anemia.  Clinical Impression  Pt is a 68 year old male who lives with a caregiver and is independent with mobility at baseline.  Pt is A&O and reports weakness and discomfort in L UE when PT arrives.  He is able to perform bed mobility mod I and sit at EOB without difficulty.  Pt visibly uncomfortable when sitting but reports no symptoms of hypotension.  His BP remained WNL throughout.  Pt able to stand from bedside with RW and walk a few steps to chair, more stable with use of RW.  Pt sat in chair with uncontrolled descent and PT assisted pt in positioning L UE for comfort.  Pt cited too much fatigue to walk farther.  Pt will continue to benefit from skilled PT with focus on strength, tolerance to activity, use of new AD and balance.  Pt will benefit from [facility] PT following discharge.    Follow Up Recommendations Home health PT;Supervision - Intermittent    Equipment Recommendations  Rolling walker with 5" wheels;3in1 (PT)    Recommendations for Other Services       Precautions / Restrictions Precautions Precautions: Fall Restrictions Weight Bearing Restrictions: No      Mobility  Bed Mobility Overal bed mobility: Modified Independent             General bed mobility comments: Very slow with HOB elevated  Transfers Overall transfer level: Needs assistance Equipment used: Rolling walker (2 wheeled) Transfers: Sit to/from Stand Sit to Stand: Min guard         General transfer comment: Slow to rise but able to without physical assistance from PT.  Pt used RW for stability.  Ambulation/Gait Ambulation/Gait assistance: Min guard Gait Distance (Feet): 8 Feet Assistive device:  Rolling walker (2 wheeled)     Gait velocity interpretation: <1.31 ft/sec, indicative of household ambulator General Gait Details: Low foot clearance and step length.  Pt more stable with RW.  Appears very weak.  Stairs            Wheelchair Mobility    Modified Rankin (Stroke Patients Only)       Balance Overall balance assessment: Needs assistance Sitting-balance support: Feet supported Sitting balance-Leahy Scale: Good     Standing balance support: Bilateral upper extremity supported Standing balance-Leahy Scale: Fair Standing balance comment: Appropriate for RW use at this time for stability.  Able to stand for short periods of time without UE support.                             Pertinent Vitals/Pain Pain Assessment: No/denies pain(Reports overall weakness and feelings of discomfort.)    Home Living Family/patient expects to be discharged to:: Private residence Living Arrangements: Non-relatives/Friends(Lives with caregiver) Available Help at Discharge: Personal care attendant;Available 24 hours/day Type of Home: House Home Access: Stairs to enter Entrance Stairs-Rails: Can reach both Entrance Stairs-Number of Steps: 3 Home Layout: One level Home Equipment: None      Prior Function Level of Independence: Independent         Comments: Pt does not currently have an AD.  He has been a limited Hydrographic surveyor.     Hand  Dominance        Extremity/Trunk Assessment   Upper Extremity Assessment Upper Extremity Assessment: Generalized weakness;LUE deficits/detail LUE Deficits / Details: Large tumor present on posterior upper arm with dressing applied.  Tumor is weeping and requires padding to be placed beneath. LUE: Unable to fully assess due to pain    Lower Extremity Assessment Lower Extremity Assessment: Overall WFL for tasks assessed(Ankles, knees and hips grossly 4/5 bilaterally)    Cervical / Trunk Assessment Cervical / Trunk  Assessment: Normal  Communication   Communication: No difficulties  Cognition Arousal/Alertness: Awake/alert Behavior During Therapy: WFL for tasks assessed/performed Overall Cognitive Status: Within Functional Limits for tasks assessed                                 General Comments: Pt visibly uncomfortable but willing to participate with therapy.  Follows directions consistently.      General Comments      Exercises     Assessment/Plan    PT Assessment Patient needs continued PT services  PT Problem List Decreased strength;Decreased mobility;Decreased activity tolerance;Decreased knowledge of use of DME;Decreased balance       PT Treatment Interventions DME instruction;Therapeutic activities;Gait training;Therapeutic exercise;Patient/family education;Stair training;Balance training;Functional mobility training    PT Goals (Current goals can be found in the Care Plan section)  Acute Rehab PT Goals Patient Stated Goal: To return to therapy for lymphedema PT Goal Formulation: With patient Time For Goal Achievement: 01/07/19 Potential to Achieve Goals: Good    Frequency Min 2X/week   Barriers to discharge        Co-evaluation               AM-PAC PT "6 Clicks" Mobility  Outcome Measure Help needed turning from your back to your side while in a flat bed without using bedrails?: None Help needed moving from lying on your back to sitting on the side of a flat bed without using bedrails?: A Little Help needed moving to and from a bed to a chair (including a wheelchair)?: A Little Help needed standing up from a chair using your arms (e.g., wheelchair or bedside chair)?: A Little Help needed to walk in hospital room?: A Little Help needed climbing 3-5 steps with a railing? : A Lot 6 Click Score: 18    End of Session Equipment Utilized During Treatment: Gait belt Activity Tolerance: Patient limited by fatigue Patient left: in chair;with chair alarm  set;with call bell/phone within reach(Padding and pillow placed beneath L UE.) Nurse Communication: Mobility status PT Visit Diagnosis: Unsteadiness on feet (R26.81);Muscle weakness (generalized) (M62.81)    Time: 0539-7673 PT Time Calculation (min) (ACUTE ONLY): 20 min   Charges:   PT Evaluation $PT Eval Low Complexity: 1 Low          Roxanne Gates, PT, DPT   Roxanne Gates 12/24/2018, 10:15 AM

## 2018-12-24 NOTE — Progress Notes (Signed)
[facility] [place] NAME: Louis Davies    MR#:  [id number]  DATE OF BIRTH:  [birth year]  SUBJECTIVE:  Patient with temperature of 100.5 this morning REVIEW OF SYSTEMS:    Review of Systems  Constitutional:+for fever, no chills weight loss + Generalized weakness HENT: Negative for ear pain, nosebleeds, congestion, facial swelling, rhinorrhea, neck pain, neck stiffness and ear discharge.   Respiratory: Negative for cough, shortness of breath, wheezing  Cardiovascular: Negative for chest pain, palpitations and leg swelling.  Gastrointestinal: Negative for heartburn, abdominal pain, vomiting, diarrhea or consitpation  Genitourinary: Negative for dysuria, urgency, frequency, hematuria Musculoskeletal: Negative for back pain or joint pain Neurological: Negative for dizziness, seizures, syncope, focal weakness,  numbness and headaches.  Hematological: Does not bruise/bleed easily.  Psychiatric/Behavioral: Negative for hallucinations, confusion, dysphoric mood    Tolerating Diet: yes     DRUG ALLERGIES:  No Known Allergies  VITALS:  Blood pressure (!) 93/53, pulse 80, temperature 98.4 F (36.9 C), temperature source Oral, resp. rate 16, height 6' (1.829 m), weight 73.4 kg, SpO2 100 %.  PHYSICAL EXAMINATION:  Constitutional: Appears chronically ill-appearing no distress. HENT: Normocephalic. Marland Kitchen Oropharynx is clear and moist.  Eyes: Conjunctivae and EOM are normal. PERRLA, no scleral icterus.  Neck: Normal ROM. Neck supple. No JVD. No tracheal deviation. CVS: RRR, S1/S2 +, no murmurs, no gallops, no carotid bruit.  Pulmonary: Effort and breath sounds normal, no stridor, rhonchi, wheezes, rales.  Abdominal: Soft. BS +,  no distension, tenderness, rebound or guarding.  Musculoskeletal: Normal range of motion. No edema and no tenderness.  Neuro: Alert. CN 2-12 grossly intact. No focal deficits. Skin: Bulky mass to the left axillary region  grossly necrotic and foul-smelling Psychiatric: Normal mood and affect.      LABORATORY PANEL:   CBC Recent Labs  Lab 12/24/18 0443  WBC 17.6*  HGB 7.8*  HCT 24.5*  PLT 327   ------------------------------------------------------------------------------------------------------------------  Chemistries  Recent Labs  Lab 12/22/18 1133  12/24/18 0319  NA 132*   < > 131*  K 4.1   < > 4.0  CL 99   < > 104  CO2 22   < > 21*  GLUCOSE 111*   < > 108*  BUN 22   < > 19  CREATININE 0.87   < > 0.64  CALCIUM 7.1*   < > 6.8*  AST 23  --   --   ALT 19  --   --   ALKPHOS 63  --   --   BILITOT 0.3  --   --    < > = values in this interval not displayed.   ------------------------------------------------------------------------------------------------------------------  Cardiac Enzymes No results for input(s): TROPONINI in the last 168 hours. ------------------------------------------------------------------------------------------------------------------  RADIOLOGY:  Ct Angio Chest Pe W And/or Wo Contrast  Result Date: 12/22/2018 EXAM: CT ANGIOGRAPHY CHEST WITH CONTRAST TECHNIQUE: Multidetector CT imaging of the chest was performed using the standard protocol during bolus administration of intravenous contrast. Multiplanar CT image reconstructions and MIPs were obtained to evaluate the vascular anatomy. CONTRAST:  59mL OMNIPAQUE IOHEXOL 350 MG/ML SOLN COMPARISON:  PET-CT 10/22/2018 and previous FINDINGS: Cardiovascular: Heart size normal. No pericardial effusion. Dilated central pulmonary arteries. Satisfactory opacification of pulmonary arteries noted, and there is no evidence of pulmonary emboli. Adequate contrast opacification of the thoracic aorta with no evidence of dissection, aneurysm, or stenosis. There is classic 3-vessel brachiocephalic arch anatomy without proximal stenosis. No significant atheromatous plaque. Mediastinum/Nodes: No  hilar or mediastinal adenopathy.  Lungs/Pleura: Trace bilateral pleural effusions. No pneumothorax. Dependent atelectasis posteriorly in both lower lobes. Calcified left lower lobe granuloma. 3 mm nonspecific nodules in the right lower lobe image 56/6 and right upper lobe image 52/6. Upper Abdomen: Stable hepatic segment 4A probable cyst. No acute findings. Musculoskeletal: Significant interval enlargement of left axillary mass no measuring at least 19.3 cm, incompletely visualized. Stable adjacent embolization coils. Anterior vertebral endplate spurring at multiple levels in the mid and lower thoracic spine. No fracture or worrisome bone lesion. Review of the MIP images confirms the above findings. IMPRESSION: 1. Negative for acute PE or thoracic aortic dissection. 2. Trace bilateral pleural effusions. 3. Interval enlargement of left axillary mass, incompletely visualized. Electronically Signed   By: Lucrezia Europe M.D.   On: 12/22/2018 15:59   Dg Chest Port 1 View  Result Date: 12/22/2018 CLINICAL DATA:  Near syncope.  History of melanoma EXAM: PORTABLE CHEST 1 VIEW COMPARISON:  10/15/2017 FINDINGS: Heart size and vascularity normal. Lungs are clear without infiltrate or effusion Large left axillary mass. Embolization coils in the left axillary region. IMPRESSION: No active disease. Electronically Signed   By: Franchot Gallo M.D.   On: 12/22/2018 12:07     ASSESSMENT AND PLAN:    68 year old male with history of large left axiallary necrotic mass status post several debridements and metastatic melanoma on chemotherapy who presents due to analyzed weakness and near syncope.  1.  Sepsis: Patient presents with fever, leukocytosis, tachypnea.   Sepsis is thought to be due to left axillary necrotic mass.  I spoke with Dr. Rosana Hoes who does not believe that this is etiology of sepsis.   Patient had fever this morning of 100.5  I will continue cefepime, Flagyl and vancomycin. Blood cultures have been negative 2.  Hyponatremia: This is from poor  p.o. intake Continue normal saline and check labs in the a.m.  3.  Acute on chronic anemia: Patient is status post 3 unit PRBC. Ideally hemoglobin should be greater than 8 and therefore will require 1 more unit of PRBCs this morning.  4.  Metastatic melanoma on chemotherapy: Oncology evaluation appreciated and patient has outpatient follow-up March 11.  5.  Hypokalemia: Replete PRN 6.  Large axillary necrotic mass: Dr. Rosana Hoes does not feel that the left axillary necrotic mass is because of sepsis. He has no plans for surgical intervention.  Patient has outpatient follow-up next week at [facility] with Dr. Freddi Che He does not recommend transfer to [facility] at this time.  7.  Protein calorie malnutrition: Dietary consulted  Management plans discussed with the patient and he is in agreement.  CODE STATUS: full  TOTAL TIME TAKING CARE OF THIS PATIENT: 24 minutes.     POSSIBLE D/C tomorrow if there is no fever and hemoglobin is greater than 8 with home health care   Astra Gregg M.D on 12/24/2018 at 11:22 AM  Between 7am to 6pm - Pager - [phone] After 6pm go to [email or web address] - password [email or web address] [place] [facility]  Office  [phone]  CC: Primary care physician; Alvera Singh, FNP  Note: This dictation was prepared with Dragon dictation along with smaller phrase technology. Any transcriptional errors that result from this process are unintentional.

## 2018-12-24 NOTE — Progress Notes (Signed)
Pharmacy Antibiotic Note  Louis Davies is a 68 y.o. male admitted on 12/22/2018 with sepsis unknown source.  Pharmacy has been consulted for vancomycin and cefepime dosing. Pt received cefepime,. Flagyl, and vancomycin in the ED.   Plan: Cefepime 2 g IV q8h   Continue Vancomycin 1000 mg IV Q 12 hrs. Goal AUC 400-550. Expected AUC: 476 SCr used: 0.80  SCr ordered 3/8 AM to continue to monitor renal function   Height: 6' (182.9 cm) Weight: 161 lb 13.1 oz (73.4 kg) IBW/kg (Calculated) : 77.6  Temp (24hrs), Avg:98.6 F (37 C), Min:97.7 F (36.5 C), Max:100.5 F (38.1 C)  Recent Labs  Lab 12/22/18 1133 12/22/18 1527 12/23/18 0845 12/24/18 0319 12/24/18 0443  WBC 22.0*  --  14.7*  --  17.6*  CREATININE 0.87  --  0.66 0.64  --   LATICACIDVEN 2.1* 0.8  --   --   --     Estimated Creatinine Clearance: 90.5 mL/min (by C-G formula based on SCr of 0.64 mg/dL).    No Known Allergies  Antimicrobials this admission: Cefepime 3/4 >> Flagyl 3/4 >> vanc 3/4 >>  Dose adjustments this admission:   Microbiology results: 3/4 BCx: NG TD 3/4 UCx: NG final   Thank you for allowing pharmacy to be a part of this patient's care.  Pernell Dupre, PharmD, BCPS Clinical Pharmacist 12/24/2018 12:35 PM

## 2018-12-24 NOTE — Consult Note (Signed)
[place]  Telephone:([phone] [phone]   Name: Louis Davies Date: 12/24/2018 MRN: [id number]  DOB: Jun 19, 1951  Patient Care Team: Alvera Singh, FNP as PCP - General (Family Medicine)    REASON FOR CONSULTATION: Palliative Care consult requested for this 68 y.o. 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 He is also s/p XRT and chemotx. Previous PET scan results from September 2019 showed improved disease.  Repeat PET scan on 10/22/18 revealed enlarging left axillary mass but stable lymph nodes and pulmonary nodules. Patient's care has been complicated by left extremity swelling managed by lymphedema clinic and left axilla wound managed at the wound center.  He has had several surgical debridements with Dr. Rosana Hoes. Patient has been referred to a melanoma specialist at [facility] who is considering LUE amputation. Patient was admitted on 12/22/18 with near syncope and sepsis from wound. He was also found to have acute on chronic anemia. Palliative care was asked to help address goals of care.  SOCIAL HISTORY:    Patient lives alone.  He has two daughters and a son. He also has a significant other involved in his care. He works as a Games developer.  ADVANCE DIRECTIVES:  Has a living will and daughter is HCPOA.  CODE STATUS: Full code  PAST MEDICAL HISTORY: Past Medical History:  Diagnosis Date  . Anemia   . Arthritis   . Cancer ([place])    MELANOMA  . Complication of anesthesia   . Dyspnea    doe  . Perforated viscus   . PONV (postoperative nausea and vomiting)     PAST SURGICAL HISTORY:  Past Surgical History:  Procedure Laterality Date  . AXILLARY LYMPH NODE DISSECTION Left 06/26/2017   Procedure: EXCISION OF LEFT AXILLARY MASS;  Surgeon: Johnathan Hausen, MD;  Location: [facility] ORS;  Service: General;  Laterality: Left;  . AXILLARY LYMPH NODE DISSECTION Left 11/01/2018   Procedure: AXILLARY DEBRIDEMENT;  Surgeon: Vickie Epley, MD;  Location: [facility] OR[facility];  Service: General;  Laterality: Left;  . AXILLARY LYMPH NODE DISSECTION Left 12/06/2018   Procedure: PALLIATIVE DEBULKING OF AXILLARY TUMOR-LEFT;  Surgeon: Vickie Epley, MD;  Location: [facility] OR[facility];  Service: General;  Laterality: Left;  . DIAGNOSTIC LAPAROSCOPY    . EMBOLIZATION Left 05/11/2018   Procedure: EMBOLIZATION;  Surgeon: Katha Cabal, MD;  Location: [facility];  Service: Cardiovascular;  Laterality: Left;  . LAPAROTOMY N/A 10/14/2017   Procedure: EXPLORATORY LAPAROTOMY;  Surgeon: Clayburn Pert, MD;  Location: [facility];  Service: General;  Laterality: N/A;  . VASECTOMY    . WOUND DEBRIDEMENT Left 09/24/2018   Procedure: LEFT AXILLARY EXCISIONAL DEBRIDEMENT OF NECROTIC TISSUE;  Surgeon: Vickie Epley, MD;  Location: [facility];  Service: General;  Laterality: Left;    HEMATOLOGY/ONCOLOGY HISTORY:  Oncology History   Patient initially  noticed an enlarging nodule in his left axilla approximately in July 2018. Subsequent excisional biopsy revealed metastatic melanoma. Patient had a lesion removed from his left back back in 2016 but otherwise has noted no other problems.  Had Pet Scan in Sept 2019 revealing metastatic disease. BRAF mutation is positive.    MRI of Brain revealed no metastatic disease.   Began with  immunotherapy using ipilumimab and nivolumab every 3 weeks for 4 cycles and then continue with maintenance nivomumab every 2 weeks  until progression of disease.  - Ipilimimab & nivolumab complicated by small intestine perforation. Immunotherapy discontinued 10/2017.   Started on 960 mg Zelboraf (10/2017) daily. BRAF mutation is positive.  Dose reduced to 73m BID in March 2019 d/t side effects including nausea, vomiting, weight loss and abdominal  bloating.   [place]  Restarted at reduced dose of 480 mg in May 2019.  Had restaging PET scan may 2019 revealed esponse to therapy, as evidenced by decreased size and hypermetabolism of dominant left axillary mass. Other thoracic nodes were improved and resolved in hypermetabolism as detailed above. 2. Decreased hypermetabolism within indeterminate left iliac lesion  On 04/26/18 patient noted enlarging axillary mass and was concerned. CT chest ordered revelaing a large left axillary mass that had dramatically increased in size, measuring over 5 times the volume that it did on 03/04/2018. Appearance compatible with rapid growth of malignancy.  - 05/2018 s/p XRT to left axillary mass - 06/2018 PET CT revealing dominant left axillary mass with small ulcerative lesion within the left posterior chest and 2 cm lucent lesion of the left iliac bone -11/29 evaluation for chronic painful left axillary wound and thought to be 2/2 tumor necrosis was treated with Keflex and supportive wound care with no improvement 09/24/2018-debridement and excision of large necrotic mass followed by rapid enlargement of tumor [facility]16/[facility] and associated pain/infection.  CT chest revealing postsurgical changes in left axilla with heterogeneous density with superimposed infection.  No extension in the thoracic cavity.  Stable small bilateral pulmonary nodules measuring up to 4 mm several of which are calcified suggestive of prior granulomatosis disease -10/25/2017 evaluated by Dr. STrenda Moots- Initiated encorafenib and binimetinib on 11/01/2018     [place]   07/07/2017 Initial Diagnosis    [place]ast[place]     ALLERGIES:  has No Known Allergies.  MEDICATIONS:  Current Facility-Administered Medications  Medication Dose Route Frequency Provider Last Rate Last Dose  . 0.9 %  sodium  chloride infusion   Intravenous PRN SGorden Harms MD   Stopped at 12/23/18 06270 . albuterol (PROVENTIL) (2.5 MG/3ML) 0.083% nebulizer solution 2.5 mg  2.5 mg Nebulization Q6H PRN Salary, Montell D, MD      . Binimetinib TABS 15 mg  15 mg Oral BID Salary, Montell D, MD   15 mg at 12/24/18 1008  . ceFEPIme (MAXIPIME) 2 g in sodium chloride 0.9 % 100 mL IVPB  2 g Intravenous Q8H WRocky Morel RPH 200 mL/hr at 12/24/18 0848 2 g at 12/24/18 0848  . docusate sodium (COLACE) capsule 100 mg  100 mg Oral BID SLoney HeringD, MD   100 mg at 12/24/18 0836  . Encorafenib CAPS 225 mg  225 mg Oral Daily Salary, Montell D, MD   225 mg at 12/24/18 1007  . enoxaparin (LOVENOX) injection 40 mg  40 mg Subcutaneous Q24H Salary, MAvel Peace MD   Stopped at 12/23/18 1919  . fentaNYL (DURAGESIC) 25 MCG/HR 1 patch  1 patch Transdermal Q72H Salary, Montell D, MD   1 patch at 12/24/18 0640  . ibuprofen (ADVIL,MOTRIN) tablet 200 mg  200 mg Oral Q6H PRN Salary, Montell D, MD   200 mg at 12/24/18 1017  . MEDLINE mouth rinse  15 mL Mouth Rinse BID Salary, Montell D, MD      . metroNIDAZOLE (FLAGYL) IVPB 500 mg  500 mg Intravenous Vida Roller, MD 100 mL/hr at 12/24/18 0504 500 mg at 12/24/18 0504  . morphine 2 MG/ML injection 2 mg  2 mg Intravenous Q2H PRN Salary, Montell D, MD      . multivitamin with minerals tablet 1 tablet  1 tablet Oral Daily Salary, Avel Peace, MD   1 tablet at 12/24/18 0836  . ondansetron (ZOFRAN) tablet 8 mg  8 mg Oral Q8H PRN Salary, Montell D, MD   8 mg at 12/23/18 0949  . oxyCODONE (Oxy IR/ROXICODONE) immediate release tablet 10 mg  10 mg Oral Q4H PRN Salary, Montell D, MD   10 mg at 12/24/18 1140  . polyethylene glycol (MIRALAX / GLYCOLAX) packet 17 g  17 g Oral Daily PRN Salary, Montell D, MD      . promethazine (PHENERGAN) tablet 12.5 mg  12.5 mg Oral Q6H PRN Salary, Montell D, MD      . vancomycin (VANCOCIN) IVPB 1000 mg/200 mL premix  1,000 mg Intravenous Q12H Rocky Morel, RPH  200 mL/hr at 12/24/18 0300 1,000 mg at 12/24/18 0300    VITAL SIGNS: BP (!) 93/53   Pulse 80   Temp 98.4 F (36.9 C) (Oral)   Resp 16   Ht 6' (1.829 m)   Wt 161 lb 13.1 oz (73.4 kg)   SpO2 100%   BMI 21.95 kg/m  Filed Weights   12/22/18 1117  Weight: 161 lb 13.1 oz (73.4 kg)    Estimated body mass index is 21.95 kg/m as calculated from the following:   Height as of this encounter: 6' (1.829 m).   Weight as of this encounter: 161 lb 13.1 oz (73.4 kg).  LABS: CBC:    Component Value Date/Time   WBC 17.6 (H) 12/24/2018 0443   HGB 7.8 (L) 12/24/2018 0443   HCT 24.5 (L) 12/24/2018 0443   PLT 327 12/24/2018 0443   MCV 80.9 12/24/2018 0443   NEUTROABS 20.2 (H) 12/22/2018 1133   LYMPHSABS 0.5 (L) 12/22/2018 1133   MONOABS 1.0 12/22/2018 1133   EOSABS 0.0 12/22/2018 1133   BASOSABS 0.0 12/22/2018 1133   Comprehensive Metabolic Panel:    Component Value Date/Time   NA 131 (L) 12/24/2018 0319   K 4.0 12/24/2018 0319   CL 104 12/24/2018 0319   CO2 21 (L) 12/24/2018 0319   BUN 19 12/24/2018 0319   CREATININE 0.64 12/24/2018 0319   GLUCOSE 108 (H) 12/24/2018 0319   CALCIUM 6.8 (L) 12/24/2018 0319   AST 23 12/22/2018 1133   ALT 19 12/22/2018 1133   ALKPHOS 63 12/22/2018 1133   BILITOT 0.3 12/22/2018 1133   PROT 4.3 (L) 12/22/2018 1133   ALBUMIN 1.6 (L) 12/22/2018 1133    RADIOGRAPHIC STUDIES: Ct Angio Chest Pe W And/or Wo Contrast  Result Date: 12/22/2018 EXAM: CT ANGIOGRAPHY CHEST WITH CONTRAST TECHNIQUE: Multidetector CT imaging of the chest was performed using the standard protocol during bolus administration of intravenous contrast. Multiplanar CT image reconstructions and MIPs were obtained to evaluate the vascular anatomy. CONTRAST:  41m OMNIPAQUE IOHEXOL 350 MG/ML SOLN COMPARISON:  PET-CT 10/22/2018 and previous FINDINGS: Cardiovascular: Heart size normal. No pericardial effusion. Dilated central pulmonary arteries. Satisfactory opacification of pulmonary arteries  noted, and there is no evidence of pulmonary emboli. Adequate contrast opacification of the thoracic aorta with no evidence of dissection, aneurysm, or stenosis. There is classic 3-vessel brachiocephalic arch anatomy without proximal stenosis. No significant atheromatous plaque. Mediastinum/Nodes: No hilar or mediastinal adenopathy. Lungs/Pleura: Trace bilateral pleural effusions.  No pneumothorax. Dependent atelectasis posteriorly in both lower lobes. Calcified left lower lobe granuloma. 3 mm nonspecific nodules in the right lower lobe image 56/6 and right upper lobe image 52/6. Upper Abdomen: Stable hepatic segment 4A probable cyst. No acute findings. Musculoskeletal: Significant interval enlargement of left axillary mass no measuring at least 19.3 cm, incompletely visualized. Stable adjacent embolization coils. Anterior vertebral endplate spurring at multiple levels in the mid and lower thoracic spine. No fracture or worrisome bone lesion. Review of the MIP images confirms the above findings. IMPRESSION: 1. Negative for acute PE or thoracic aortic dissection. 2. Trace bilateral pleural effusions. 3. Interval enlargement of left axillary mass, incompletely visualized. Electronically Signed   By: Lucrezia Europe M.D.   On: 12/22/2018 15:59   Dg Chest Port 1 View  Result Date: 12/22/2018 CLINICAL DATA:  Near syncope.  History of melanoma EXAM: PORTABLE CHEST 1 VIEW COMPARISON:  10/15/2017 FINDINGS: Heart size and vascularity normal. Lungs are clear without infiltrate or effusion Large left axillary mass. Embolization coils in the left axillary region. IMPRESSION: No active disease. Electronically Signed   By: Franchot Gallo M.D.   On: 12/22/2018 12:07    PERFORMANCE STATUS (ECOG) : 1 - Symptomatic but completely ambulatory  REVIEW OF SYSTEMS: As noted above. Otherwise, a complete review of systems is negative.  PHYSICAL EXAM: General: NAD, frail appearing, thin Pulmonary: Unlabored Extremities: no edema, no  joint deformities Skin: wound noted but not visualized, malodorous  Neurological: Weakness but otherwise nonfocal  IMPRESSION: Patient known to me from the clinic.  Patient says he feels generally miserable today with persistent pain and fatigue.  His significant other was at bedside.  Patient had a low-grade fever overnight with T-max of 100.5.  Has slightly worsened leukocytosis today 14.7->17.6. He continues on IV abx. Also is receiving unit of blood due to acute on chronic anemia (hg 7.8 today).  Patient has scheduled follow-up with Dr. Freddi Che at [facility] on 12/29/2018 for consideration of left upper extremity amputation.  Patient says his goals are aligned with the present scope of treatment, with plan to pursue amputation if it is felt to be a viable option.  Patient and significant other say that they have been told the patient would otherwise die as a result of exsanguination from the wound.  Wound has been evaluated by Dr. Rosana Hoes. Wound care currently consists of wet to dry dressings as needed. RN says that they are having to change frequently due to excess drainage. Wound is also significantly malodorous. Would he receive benefit from metronidazole powder in the wound bed to help control odor?  Patient continues to have persistent pain.  He says pain was improved with the addition of transdermal fentanyl but that he feels pain is significantly worse on day 3 when it is time to change the patch. Patient also continues to require frequent oxycodone for BTP (total of 29m in past 24 hours). It is possible the patient is a rapid metabolizer of fentanyl.  Discussed option of increasing fentanyl to 50 mcg and changing frequency to every 48 hours.   PLAN: -Continue current scope of treatment -Increase transdermal fentanyl to 50 mcg every 48 hours -Continue oxycodone for breakthrough pain -Consider use of metronidazole powder to wound bed for odor control -We will plan follow-up in outpatient clinic  following discharge   Time Total: 50 minutes  Visit consisted of counseling and education dealing with the complex and emotionally intense issues of symptom management and palliative care in the setting  of serious and potentially life-threatening illness.Greater than 50%  of this time was spent counseling and coordinating care related to the above assessment and plan.  Signed by: Altha Harm, PhD, NP-C [phone] (Work Cell)

## 2018-12-24 NOTE — Progress Notes (Signed)
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  GOAL:   Patient will meet greater than or equal to 90% of their needs  MONITOR:   PO intake, Weight trends, Skin, Labs  REASON FOR ASSESSMENT:   Malnutrition Screening Tool    ASSESSMENT:   68 year old male who presented to the ED on 3/4 for evaluation of syncope. PMH significant for stage IV melanoma with mets on chemotherapy, chronic left axillary mass s/p debulking x 3, chronic anemia. Pt admitted with sepsis.  Spoke with pt and friend at bedside. Pt reports that he has experienced taste changes and that food does not taste as good as it used to. Pt reports specifically disliking sweeter foods like ice cream, milkshakes, etc.  Pt's friedd reports that she is very into nutrition and really "pushes" food on pt because "he can't lose anymore weight." Pt's friend able to list ways that she ensures pt is receiving adequate calories: 3 meals with snacks in between, higher calorie and higher protein snack and meal options, homemade protein shake, etc. Pt's friend shares that pt eats 3 meals daily and snacks between meals.  Breakfast: homemade protein shake (vegan protein powder, mixed nuts, mixed  berries, banana, chia seeds, ice, water), eggs, English muffin, fruit Lunch: grilled cheese sandwich or a sandwich picked up from a [employer]: "decent meal" (starch, protein, vegetables) Snacks: fruit, popcorn, mixed nuts  Pt endorses weight loss that began in June or July of 2018. At that time, pt weighed approximately 200 lbs. Pt states that in January 2019, he weighed 185 lbs and now weighs "in the 150's." Pt's friend keeps detailed logs of pt's weight and showed these to RD. Per weight history in chart, pt's weight has been fairly stable with fluctuations between 71-1-74.7 kg since January 2020. However, RD noted a weight loss of 10.7 kg since 06/23/18. This is a 12.7% weight loss in 6 months which is significant for timeframe.  Pt ordering lunch meal at time of visit and receiving PRBC infusion. Pt amenable to snacks between meals. RD will also order double protein portions.  RD reviewed [facility] RD notes from February and March 2019. Pt had been diagnosed with severe malnutrition at that time.  Medications reviewed and include: Colace, MVI with minerals, IV antibiotics  Labs reviewed: sodium 131 (L), hemoglobin 7.8 (L)  UOP: 1250 ml x 24 hours I/O's: +2.3 L since admit  NUTRITION - FOCUSED PHYSICAL EXAM:    Most Recent Value  Orbital Region  Moderate depletion  Upper Arm Region  Severe depletion  Thoracic and Lumbar Region  Moderate depletion  Buccal Region  Moderate depletion  Temple Region  Moderate depletion  Clavicle Bone Region  Severe depletion  Clavicle and Acromion Bone Region  Severe depletion  Scapular Bone Region  Moderate depletion  Dorsal Hand  Severe depletion  Patellar Region  Moderate depletion  Anterior Thigh Region  Moderate depletion  Posterior Calf Region  Moderate depletion  Edema (RD Assessment)  Moderate [LUE, RLE, LLE]  Hair  Reviewed  Eyes  Reviewed  Mouth  Reviewed  Skin  Reviewed  Nails  Reviewed       Diet Order:   Diet Order             Diet regular Room service appropriate? Yes; Fluid consistency: Thin  Diet effective now              EDUCATION NEEDS:   No education needs have been identified at this time  Skin:  Skin Assessment: Skin Integrity Issues: Incisions: L arm Other: necrotic tumor L axilla  Last BM:  12/21/18  Height:   Ht Readings from Last 1 Encounters:  12/22/18 6' (1.829 m)    Weight:   Wt Readings from Last 1 Encounters:  12/22/18 73.4 kg    Ideal Body Weight:  80.9 kg  BMI:  Body mass index is 21.95 kg/m.  Estimated Nutritional Needs:   Kcal:  2200-2400  Protein:  105-120 grams  Fluid:  >/= 2.0 L    Gaynell Face, MS, RD, LDN Inpatient Clinical Dietitian Pager: [phone] Weekend/After Hours: [phone]

## 2018-12-24 NOTE — Care Management Important Message (Signed)
Important Message  Patient Details  Name: Louis Davies MRN: [id number] Date of Birth: [birth year]   Medicare Important Message Given:  Yes    Juliann Pulse A Dolphus Linch 12/24/2018, 11:27 AM

## 2018-12-25 LAB — CBC
HCT: 30.4 % — ABNORMAL LOW (ref 39.0–52.0)
Hemoglobin: 9.7 g/dL — ABNORMAL LOW (ref 13.0–17.0)
MCH: 26.3 pg (ref 26.0–34.0)
MCHC: 31.9 g/dL (ref 30.0–36.0)
MCV: 82.4 fL (ref 80.0–100.0)
Platelets: 303 10*3/uL (ref 150–400)
RBC: 3.69 MIL/uL — AB (ref 4.22–5.81)
RDW: 17.7 % — ABNORMAL HIGH (ref 11.5–15.5)
WBC: 14.7 10*3/uL — ABNORMAL HIGH (ref 4.0–10.5)
nRBC: 0 % (ref 0.0–0.2)

## 2018-12-25 LAB — TYPE AND SCREEN
ABO/RH(D): B NEG
Antibody Screen: NEGATIVE
Unit division: 0
Unit division: 0
Unit division: 0
Unit division: 0

## 2018-12-25 LAB — BPAM RBC
Blood Product Expiration Date: 202004032359
Blood Product Expiration Date: 202004032359
Blood Product Expiration Date: 202004042359
Blood Product Expiration Date: 202004042359
ISSUE DATE / TIME: 202003042121
ISSUE DATE / TIME: 202003050359
ISSUE DATE / TIME: 202003051355
ISSUE DATE / TIME: 202003061024
UNIT TYPE AND RH: 1700
Unit Type and Rh: 1700
Unit Type and Rh: 1700
Unit Type and Rh: 1700

## 2018-12-25 LAB — BASIC METABOLIC PANEL
Anion gap: 5 (ref 5–15)
BUN: 18 mg/dL (ref 8–23)
CHLORIDE: 107 mmol/L (ref 98–111)
CO2: 23 mmol/L (ref 22–32)
Calcium: 7.1 mg/dL — ABNORMAL LOW (ref 8.9–10.3)
Creatinine, Ser: 0.79 mg/dL (ref 0.61–1.24)
GFR calc Af Amer: >60 mL/min (ref >60)
GFR calc non Af Amer: >60 mL/min (ref >60)
Glucose, Bld: 94 mg/dL (ref 70–99)
POTASSIUM: 3.7 mmol/L (ref 3.5–5.1)
Sodium: 135 mmol/L (ref 135–145)

## 2018-12-25 MED ORDER — FLEET ENEMA 7-19 GM/118ML RE ENEM
1.0000 | ENEMA | Freq: Once | RECTAL | Status: AC
  Administered 2018-12-25: 1 via RECTAL

## 2018-12-25 MED ORDER — AMOXICILLIN-POT CLAVULANATE 875-125 MG PO TABS: 1.0000 | ORAL_TABLET | Freq: Two times a day (BID) | ORAL | 0 refills | Status: AC

## 2018-12-25 MED ORDER — BISACODYL 10 MG RE SUPP
10.0000 mg | Freq: Once | RECTAL | Status: AC
  Administered 2018-12-25: 10 mg via RECTAL
  Filled 2018-12-25: qty 1

## 2018-12-25 MED ORDER — FENTANYL 50 MCG/HR TD PT72: 1.0000 | MEDICATED_PATCH | TRANSDERMAL | 0 refills | Status: AC

## 2018-12-25 NOTE — Progress Notes (Signed)
[place] [facility] [place] NAME: Louis Davies    MR#:  [id number]  DATE OF BIRTH:  December 28, 1950  SUBJECTIVE:   Patient having some intermittent nausea and vomiting this morning.  Also has not had a BM in 4 to 5 days.  No other acute events overnight.  REVIEW OF SYSTEMS:    Review of Systems  Constitutional: Negative for chills and fever.  HENT: Negative for congestion and tinnitus.   Eyes: Negative for blurred vision and double vision.  Respiratory: Negative for cough, shortness of breath and wheezing.   Cardiovascular: Negative for chest pain, orthopnea and PND.  Gastrointestinal: Positive for constipation, nausea and vomiting. Negative for abdominal pain and diarrhea.  Genitourinary: Negative for dysuria and hematuria.  Neurological: Negative for dizziness, sensory change and focal weakness.  All other systems reviewed and are negative.   Nutrition: Regular Tolerating Diet: Yes Tolerating PT: Eval noted.   DRUG ALLERGIES:  No Known Allergies  VITALS:  Blood pressure (!) 98/59, pulse 77, temperature 98.4 F (36.9 C), temperature source Oral, resp. rate 18, height 6' (1.829 m), weight 73.4 kg, SpO2 98 %.  PHYSICAL EXAMINATION:   Physical Exam  GENERAL:  68 y.o.-year-old patient lying in bed in no acute distress.  EYES: Pupils equal, round, reactive to light and accommodation. No scleral icterus. Extraocular muscles intact.  HEENT: Head atraumatic, normocephalic. Oropharynx and nasopharynx clear.  NECK:  Supple, no jugular venous distention. No thyroid enlargement, no tenderness.  LUNGS: Normal breath sounds bilaterally, no wheezing, rales, rhonchi. No use of accessory muscles of respiration.  CARDIOVASCULAR: S1, S2 normal. No murmurs, rubs, or gallops.  ABDOMEN: Soft, nontender, nondistended. Bowel sounds present. No organomegaly or mass.  EXTREMITIES: No cyanosis, clubbing or edema b/l.  Left Axillary mass with dressing over it and  with foul smelling drainage.  NEUROLOGIC: Cranial nerves II through XII are intact. No focal Motor or sensory deficits b/l.   PSYCHIATRIC: The patient is alert and oriented x 3.  SKIN: No obvious rash, lesion, or ulcer.    LABORATORY PANEL:   CBC Recent Labs  Lab 12/25/18 0401  WBC 14.7*  HGB 9.7*  HCT 30.4*  PLT 303   ------------------------------------------------------------------------------------------------------------------  Chemistries  Recent Labs  Lab 12/22/18 1133  12/25/18 0401  NA 132*   < > 135  K 4.1   < > 3.7  CL 99   < > 107  CO2 22   < > 23  GLUCOSE 111*   < > 94  BUN 22   < > 18  CREATININE 0.87   < > 0.79  CALCIUM 7.1*   < > 7.1*  AST 23  --   --   ALT 19  --   --   ALKPHOS 63  --   --   BILITOT 0.3  --   --    < > = values in this interval not displayed.   ------------------------------------------------------------------------------------------------------------------  Cardiac Enzymes No results for input(s): TROPONINI in the last 168 hours. ------------------------------------------------------------------------------------------------------------------  RADIOLOGY:  No results found.   ASSESSMENT AND PLAN:   68 year old male with history of large left axiallary necrotic mass status post several debridements and metastatic melanoma on chemotherapy who presents due to analyzed weakness and near syncope.  1.  Sepsis: Patient presents with fever, leukocytosis, tachypnea.   Sepsis is thought to be due to left axillary necrotic mass.   -Leukocytosis is improved, fever resolved.  Patient currently on cefepime  Flagyl and vancomycin.  Cultures have remained negative. As per surgery patient does not require any further surgical debridement as they do not think this is a source of patient's sepsis.  2.  Hyponatremia: This is from poor p.o. intake -Improved and resolved with IV fluid hydration.  3.  Acute on chronic anemia-this is likely  anemia of chronic disease. - Transfuse 3 units of packed red blood cells since being in the hospital.  Hemoglobin improved to over 9 today.  4.  Metastatic melanoma on chemotherapy: Oncology evaluation appreciated and patient has outpatient follow-up March 11. -Continue chemotherapy as per them.  5.  Hypokalemia: Resolved with supplementation  6.  Large axillary necrotic mass: Patient has significant foul-smelling drainage from this necrotic mass.  He has had multiple previous debridements done by general surgery.  As per them he does not require any further surgical intervention. -Patient is followed by [facility] and is seeking left upper extremity amputation for treatment of this.    7.  Constipation-patient was having some intermittent nausea vomiting.  Has not had a bowel movement in 5 days.  Given Dulcolax suppository and MiraLAX.  Will follow response.  Continue supportive care for now.  All the records are reviewed and case discussed with Care Management/Social Worker. Management plans discussed with the patient, family and they are in agreement.  CODE STATUS: Full code  DVT Prophylaxis: Lovenox  TOTAL TIME TAKING CARE OF THIS PATIENT: 30 minutes.   POSSIBLE D/C IN 1-2 DAYS, DEPENDING ON CLINICAL CONDITION.   Henreitta Leber M.D on 12/25/2018 at 2:26 PM  Between 7am to 6pm - Pager - [phone]  After 6pm go to [email or web address] - Technical brewer  [facility]  Office  [phone]  CC: Primary care physician; Alvera Singh, FNP

## 2018-12-26 NOTE — Discharge Summary (Signed)
[place] [facility] [place] NAME: Louis Davies    MR#:  [id number]  DATE OF BIRTH:  1950-11-25  DATE OF ADMISSION:  12/22/2018 ADMITTING PHYSICIAN: Gorden Harms, MD  DATE OF DISCHARGE: 12/25/2018  6:17 PM  PRIMARY CARE PHYSICIAN: Dolleschel, Raquel Sarna, FNP    ADMISSION DIAGNOSIS:  Metastatic melanoma ([place]) [C79.9] Symptomatic anemia [D64.9] Leukocytosis, unspecified type [D72.829]  DISCHARGE DIAGNOSIS:  Active Problems:   Sepsis ([place])   SECONDARY DIAGNOSIS:   Past Medical History:  Diagnosis Date  . Anemia   . Arthritis   . Cancer ([place])    MELANOMA  . Complication of anesthesia   . Dyspnea    doe  . Perforated viscus   . PONV (postoperative nausea and vomiting)     HOSPITAL COURSE:   68 year old male with history of large left axiallary necrotic mass status post several debridements and metastatic melanoma on chemotherapy who presents due to analyzed weakness and near syncope.  1. Sepsis: Patient presented with fever, leukocytosis, tachypnea.  Sepsis was thought to be due to left axillary necrotic mass.  -Leukocytosis has improved, fever resolved now with broad spectrum IV abx.  Patient was treated with IV cefepime Flagyl and vancomycin while in the hospital.  - his Cultures have remained negative though.  Patient has had previous surgical debridements as per them they did not think that this was a source of patient's sepsis. - Patient was discharged on empiric Augmentin for additional 14 days with outpatient follow-up with surgery at [facility].  2. Hyponatremia: This was from poor p.o. intake -Improved and resolved with IV fluid hydration.  3. Acute on chronic anemia-this was anemia of chronic disease. - pt. Was Transfused a total of  3 units of packed red blood cells while in the hospital and Hg. Is stable at 9.7 upon discharge.   4. Metastatic melanoma on chemotherapy: Oncology evaluation appreciated and patient has  outpatient follow-up March 11. -Continue chemotherapy as per them.  5. Hypokalemia: Resolved with supplementation  6. Large axillary necrotic mass: Patient has significant foul-smelling drainage from this necrotic mass.  He has had multiple previous debridements done by general surgery.  As per them he does not require any further surgical intervention. -Patient is followed by [facility] and is seeking left upper extremity amputation for treatment of this.    7.  Constipation- this improved and resolved with some Dulcolax suppository and MiraLAX. -Patient is tolerating p.o. and has no further nausea vomiting now.  DISCHARGE CONDITIONS:   Stable.   CONSULTS OBTAINED:    DRUG ALLERGIES:  No Known Allergies  DISCHARGE MEDICATIONS:   Allergies as of 12/25/2018   No Known Allergies     Medication List    STOP taking these medications   fentaNYL 25 MCG/HR Commonly known as:  DURAGESIC Replaced by:  fentaNYL 50 MCG/HR     TAKE these medications   amoxicillin-clavulanate 875-125 MG tablet Commonly known as:  AUGMENTIN Take 1 tablet by mouth 2 (two) times daily for 14 days.   Binimetinib 15 MG Tabs Commonly known as:  Mektovi Take 45 mg by mouth 2 (two) times daily. What changed:  how much to take   Encorafenib 75 MG Caps Take 450 mg by mouth daily. What changed:  how much to take   fentaNYL 50 MCG/HR Commonly known as:  [place] 1 patch onto the skin every other day. Replaces:  fentaNYL 25 MCG/HR   ibuprofen 200 MG tablet Commonly known as:  ADVIL,MOTRIN Take 200 mg by mouth every 6 (six) hours as needed for moderate pain.   multivitamin tablet Take 1 tablet by mouth daily.   ondansetron 8 MG tablet Commonly known as:  ZOFRAN Take 1 tablet (8 mg total) by mouth every 8 (eight) hours as needed for nausea or vomiting.   Oxycodone HCl 10 MG Tabs Take 1 tablet (10 mg total) by mouth every 4 (four) hours as needed.         DISCHARGE INSTRUCTIONS:    DIET:  Regular diet  DISCHARGE CONDITION:  Stable  ACTIVITY:  Activity as tolerated  OXYGEN:  Home Oxygen: No.   Oxygen Delivery: room air  DISCHARGE LOCATION:  home   If you experience worsening of your admission symptoms, develop shortness of breath, life threatening emergency, suicidal or homicidal thoughts you must seek medical attention immediately by calling 911 or calling your MD immediately  if symptoms less severe.  You Must read complete instructions/literature along with all the possible adverse reactions/side effects for all the Medicines you take and that have been prescribed to you. Take any new Medicines after you have completely understood and accpet all the possible adverse reactions/side effects.   Please note  You were cared for by a hospitalist during your hospital stay. If you have any questions about your discharge medications or the care you received while you were in the hospital after you are discharged, you can call the unit and asked to speak with the hospitalist on call if the hospitalist that took care of you is not available. Once you are discharged, your primary care physician will handle any further medical issues. Please note that NO REFILLS for any discharge medications will be authorized once you are discharged, as it is imperative that you return to your primary care physician (or establish a relationship with a primary care physician if you do not have one) for your aftercare needs so that they can reassess your need for medications and monitor your lab values.   DATA REVIEW:   CBC Recent Labs  Lab 12/25/18 0401  WBC 14.7*  HGB 9.7*  HCT 30.4*  PLT 303    Chemistries  Recent Labs  Lab 12/22/18 1133  12/25/18 0401  NA 132*   < > 135  K 4.1   < > 3.7  CL 99   < > 107  CO2 22   < > 23  GLUCOSE 111*   < > 94  BUN 22   < > 18  CREATININE 0.87   < > 0.79  CALCIUM 7.1*   < > 7.1*  AST 23  --   --   ALT 19  --   --   ALKPHOS 63  --    --   BILITOT 0.3  --   --    < > = values in this interval not displayed.    Cardiac Enzymes No results for input(s): TROPONINI in the last 168 hours.  Microbiology Results  Results for orders placed or performed during the hospital encounter of 12/22/18  Blood Culture (routine x 2)     Status: None (Preliminary result)   Collection Time: 12/22/18 11:33 AM  Result Value Ref Range Status   Specimen Description BLOOD BLOOD LEFT HAND  Final   Special Requests   Final    BOTTLES DRAWN AEROBIC AND ANAEROBIC Blood Culture adequate volume   Culture   Final    NO GROWTH 4 DAYS Performed at [facility],  [place], [place] [address]    Report Status PENDING  Incomplete  Urine culture     Status: None   Collection Time: 12/22/18  2:19 PM  Result Value Ref Range Status   Specimen Description   Final    URINE, RANDOM Performed at [facility], [address] [place], [place] [address]    Special Requests   Final    NONE Performed at [facility], [address], [place], [place] [address]    Culture   Final    NO GROWTH Performed at [facility], [place] [address], [place], [place] [address]    Report Status 12/23/2018 FINAL  Final    RADIOLOGY:  No results found.    Management plans discussed with the patient, family and they are in agreement.  CODE STATUS:  Code Status History    Date Active Date Inactive Code Status Order ID Comments User Context   12/22/2018 5189 12/25/2018 2122 Full Code [id number]  Salary, Avel Peace, MD ED   TOTAL TIME TAKING CARE OF THIS PATIENT: 40 minutes.    Henreitta Leber M.D on 12/26/2018 at 2:43 PM  Between 7am to 6pm - Pager - [phone]  After 6pm go to [email or web address] - Technical brewer [place] [facility]  Office  [phone]  CC: Primary care physician; Alvera Singh, FNP

## 2018-12-27 ENCOUNTER — Telehealth: Payer: Self-pay | Admitting: *Deleted

## 2018-12-27 LAB — CULTURE, BLOOD (ROUTINE X 2)
Culture: NO GROWTH
Special Requests: ADEQUATE

## 2018-12-27 NOTE — Telephone Encounter (Signed)
Pharmacy refused to fill his fentanyl 50 mcg stating it needs prior authorization first. She was asking if she can put on 2 fentanyl 25 mcg patches instead until prior authorization can be obtained, I told her yes to use 2 Fentanyl 25 mcg patches. He is changing patches every 48 hours now. Please get prior authorization [facility]

## 2018-12-28 ENCOUNTER — Encounter: Payer: Medicare Other | Admitting: Hospice and Palliative Medicine

## 2018-12-28 ENCOUNTER — Other Ambulatory Visit: Payer: Medicare Other

## 2018-12-28 ENCOUNTER — Ambulatory Visit: Payer: Medicare Other

## 2018-12-28 ENCOUNTER — Ambulatory Visit: Payer: Medicare Other | Admitting: Oncology

## 2018-12-28 ENCOUNTER — Encounter: Payer: Medicare Other | Admitting: Occupational Therapy

## 2018-12-29 ENCOUNTER — Ambulatory Visit: Payer: Medicare Other | Admitting: Occupational Therapy

## 2018-12-29 DIAGNOSIS — C439 Malignant melanoma of skin, unspecified: Secondary | ICD-10-CM | POA: Diagnosis not present

## 2018-12-29 DIAGNOSIS — J9811 Atelectasis: Secondary | ICD-10-CM | POA: Diagnosis not present

## 2018-12-29 DIAGNOSIS — R2232 Localized swelling, mass and lump, left upper limb: Secondary | ICD-10-CM | POA: Diagnosis not present

## 2018-12-29 DIAGNOSIS — J90 Pleural effusion, not elsewhere classified: Secondary | ICD-10-CM | POA: Diagnosis not present

## 2018-12-30 ENCOUNTER — Encounter: Payer: Medicare Other | Admitting: Occupational Therapy

## 2018-12-30 DIAGNOSIS — Z6822 Body mass index (BMI) 22.0-22.9, adult: Secondary | ICD-10-CM | POA: Diagnosis not present

## 2018-12-30 DIAGNOSIS — C799 Secondary malignant neoplasm of unspecified site: Secondary | ICD-10-CM | POA: Diagnosis not present

## 2018-12-31 ENCOUNTER — Other Ambulatory Visit: Payer: Self-pay | Admitting: *Deleted

## 2018-12-31 ENCOUNTER — Telehealth: Payer: Self-pay | Admitting: Hospice and Palliative Medicine

## 2018-12-31 ENCOUNTER — Encounter: Payer: Medicare Other | Admitting: Occupational Therapy

## 2018-12-31 MED ORDER — OXYCODONE HCL 10 MG PO TABS: 10.0000 mg | ORAL_TABLET | ORAL | 0 refills | Status: DC | PRN

## 2018-12-31 NOTE — Addendum Note (Signed)
Addended by: Betti Cruz on: 12/31/2018 11:51 AM   Modules accepted: Orders

## 2018-12-31 NOTE — Telephone Encounter (Signed)
Per Dr. Grayland Ormond patient can try miralax or magnesium citrate, miralax first if he has not been using anything.

## 2018-12-31 NOTE — Telephone Encounter (Signed)
Patient is 45 mins away with his daughter

## 2018-12-31 NOTE — Telephone Encounter (Signed)
Daughter called reporting that patient has not had BM in 10 days. He is not eating much had taking pain medications.He is staying with her this weekend. Please advise what he is to do/ take.

## 2018-12-31 NOTE — Telephone Encounter (Signed)
I called and spoke with patient's daughter.  She says the patient has not had a bowel movement since discharging from the hospital.  Patient was taking some type of herbal supplement but daughter was not sure what was in it.  She tried giving him Colace last night and has given him 1 Dulcolax today.  I recommended increasing the Dulcolax to 10 mg daily and adding MiraLAX daily, as that combination seemed to be effective at resolving constipation while he was hospitalized.  Daughter denies abdominal distention, pain or intractable nausea and vomiting.  Daughter says the patient has decided to pursue the left shoulder and arm amputation, which is scheduled on 01/11/2019.

## 2018-12-31 NOTE — Telephone Encounter (Signed)
Informed Louis Davies to try Miralax for a couple of days and if no results to try Mag Citrate and if no results to then call us back

## 2019-01-03 DIAGNOSIS — E871 Hypo-osmolality and hyponatremia: Secondary | ICD-10-CM | POA: Diagnosis not present

## 2019-01-03 DIAGNOSIS — J189 Pneumonia, unspecified organism: Secondary | ICD-10-CM | POA: Diagnosis not present

## 2019-01-03 DIAGNOSIS — C4362 Malignant melanoma of left upper limb, including shoulder: Secondary | ICD-10-CM | POA: Diagnosis not present

## 2019-01-03 DIAGNOSIS — C773 Secondary and unspecified malignant neoplasm of axilla and upper limb lymph nodes: Secondary | ICD-10-CM | POA: Diagnosis not present

## 2019-01-03 DIAGNOSIS — G8918 Other acute postprocedural pain: Secondary | ICD-10-CM | POA: Diagnosis not present

## 2019-01-03 DIAGNOSIS — Z4781 Encounter for orthopedic aftercare following surgical amputation: Secondary | ICD-10-CM | POA: Diagnosis not present

## 2019-01-03 DIAGNOSIS — Z9221 Personal history of antineoplastic chemotherapy: Secondary | ICD-10-CM | POA: Diagnosis not present

## 2019-01-03 DIAGNOSIS — R2232 Localized swelling, mass and lump, left upper limb: Secondary | ICD-10-CM | POA: Diagnosis not present

## 2019-01-03 DIAGNOSIS — L89152 Pressure ulcer of sacral region, stage 2: Secondary | ICD-10-CM | POA: Diagnosis not present

## 2019-01-03 DIAGNOSIS — I951 Orthostatic hypotension: Secondary | ICD-10-CM | POA: Diagnosis present

## 2019-01-03 DIAGNOSIS — C7989 Secondary malignant neoplasm of other specified sites: Secondary | ICD-10-CM | POA: Diagnosis present

## 2019-01-03 DIAGNOSIS — Z452 Encounter for adjustment and management of vascular access device: Secondary | ICD-10-CM | POA: Diagnosis not present

## 2019-01-03 DIAGNOSIS — R110 Nausea: Secondary | ICD-10-CM | POA: Diagnosis present

## 2019-01-03 DIAGNOSIS — C799 Secondary malignant neoplasm of unspecified site: Secondary | ICD-10-CM | POA: Diagnosis not present

## 2019-01-03 DIAGNOSIS — R600 Localized edema: Secondary | ICD-10-CM | POA: Diagnosis not present

## 2019-01-03 DIAGNOSIS — Z5111 Encounter for antineoplastic chemotherapy: Secondary | ICD-10-CM | POA: Diagnosis not present

## 2019-01-03 DIAGNOSIS — R112 Nausea with vomiting, unspecified: Secondary | ICD-10-CM | POA: Diagnosis not present

## 2019-01-03 DIAGNOSIS — L89893 Pressure ulcer of other site, stage 3: Secondary | ICD-10-CM | POA: Diagnosis not present

## 2019-01-03 DIAGNOSIS — D62 Acute posthemorrhagic anemia: Secondary | ICD-10-CM | POA: Diagnosis not present

## 2019-01-03 DIAGNOSIS — G893 Neoplasm related pain (acute) (chronic): Secondary | ICD-10-CM | POA: Diagnosis present

## 2019-01-03 DIAGNOSIS — R630 Anorexia: Secondary | ICD-10-CM | POA: Diagnosis not present

## 2019-01-03 DIAGNOSIS — R9431 Abnormal electrocardiogram [ECG] [EKG]: Secondary | ICD-10-CM | POA: Diagnosis not present

## 2019-01-03 DIAGNOSIS — Z978 Presence of other specified devices: Secondary | ICD-10-CM | POA: Diagnosis not present

## 2019-01-03 DIAGNOSIS — Z89232 Acquired absence of left shoulder: Secondary | ICD-10-CM | POA: Diagnosis not present

## 2019-01-03 DIAGNOSIS — D500 Iron deficiency anemia secondary to blood loss (chronic): Secondary | ICD-10-CM | POA: Diagnosis present

## 2019-01-03 DIAGNOSIS — Z4682 Encounter for fitting and adjustment of non-vascular catheter: Secondary | ICD-10-CM | POA: Diagnosis not present

## 2019-01-03 DIAGNOSIS — M6281 Muscle weakness (generalized): Secondary | ICD-10-CM | POA: Diagnosis not present

## 2019-01-03 DIAGNOSIS — J9811 Atelectasis: Secondary | ICD-10-CM | POA: Diagnosis not present

## 2019-01-03 DIAGNOSIS — R140 Abdominal distension (gaseous): Secondary | ICD-10-CM | POA: Diagnosis not present

## 2019-01-03 DIAGNOSIS — K922 Gastrointestinal hemorrhage, unspecified: Secondary | ICD-10-CM | POA: Diagnosis not present

## 2019-01-03 DIAGNOSIS — C719 Malignant neoplasm of brain, unspecified: Secondary | ICD-10-CM | POA: Diagnosis not present

## 2019-01-03 DIAGNOSIS — L89153 Pressure ulcer of sacral region, stage 3: Secondary | ICD-10-CM | POA: Diagnosis not present

## 2019-01-03 DIAGNOSIS — C761 Malignant neoplasm of thorax: Secondary | ICD-10-CM | POA: Diagnosis not present

## 2019-01-03 DIAGNOSIS — I96 Gangrene, not elsewhere classified: Secondary | ICD-10-CM | POA: Diagnosis not present

## 2019-01-03 DIAGNOSIS — K5900 Constipation, unspecified: Secondary | ICD-10-CM | POA: Diagnosis not present

## 2019-01-03 DIAGNOSIS — D649 Anemia, unspecified: Secondary | ICD-10-CM | POA: Diagnosis not present

## 2019-01-03 DIAGNOSIS — C439 Malignant melanoma of skin, unspecified: Secondary | ICD-10-CM | POA: Diagnosis present

## 2019-01-03 DIAGNOSIS — D72829 Elevated white blood cell count, unspecified: Secondary | ICD-10-CM | POA: Diagnosis present

## 2019-01-03 DIAGNOSIS — M7989 Other specified soft tissue disorders: Secondary | ICD-10-CM | POA: Diagnosis not present

## 2019-01-03 DIAGNOSIS — E039 Hypothyroidism, unspecified: Secondary | ICD-10-CM | POA: Diagnosis not present

## 2019-01-03 DIAGNOSIS — Z6822 Body mass index (BMI) 22.0-22.9, adult: Secondary | ICD-10-CM | POA: Diagnosis not present

## 2019-01-03 DIAGNOSIS — C7951 Secondary malignant neoplasm of bone: Secondary | ICD-10-CM | POA: Diagnosis not present

## 2019-01-03 NOTE — Progress Notes (Deleted)
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  INTERVAL HISTORY: Patient returns to clinic for further evaluation, repeat blood work, and consideration of blood transfusion.  He continues to tolerate dose reduced encorafenib/binimetinib.  His weakness and fatigue are unchanged and he continues to have a decreased performance status.  He has increased pain in his left axilla. He has no neurologic complaints. He denies any recent fevers or illnesses. He has no chest pain, cough, or hemoptysis. He denies any nausea, vomiting, constipation, or diarrhea. He has no melena or hematochezia. He has no urinary complaints.  Patient offers no further specific complaints today.  REVIEW OF SYSTEMS:   Review of Systems  Constitutional: Positive for malaise/fatigue. Negative for fever and weight loss.  Respiratory: Negative.  Negative for cough and shortness of breath.   Cardiovascular: Negative.  Negative for chest pain and leg swelling.  Gastrointestinal: Negative.  Negative for abdominal pain, blood in stool, melena, nausea and vomiting.  Genitourinary: Negative.  Negative for dysuria.  Musculoskeletal: Negative.  Negative for myalgias.  Skin: Negative.  Negative for rash.  Neurological: Positive for weakness. Negative for sensory change and focal weakness.  Psychiatric/Behavioral: Negative.  The patient is not nervous/anxious.     As per HPI. Otherwise, a complete review of systems is negative.  PAST MEDICAL HISTORY: Past Medical History:  Diagnosis Date   Anemia    Arthritis    Cancer ([place])    MELANOMA   Complication of anesthesia    Dyspnea    doe   Perforated viscus    PONV (postoperative nausea and vomiting)     PAST SURGICAL  HISTORY: Past Surgical History:  Procedure Laterality Date   AXILLARY LYMPH NODE DISSECTION Left 06/26/2017   Procedure: EXCISION OF LEFT AXILLARY MASS;  Surgeon: Johnathan Hausen, MD;  Location: [place] [facility];  Service: General;  Laterality: Left;   AXILLARY LYMPH NODE DISSECTION Left 11/01/2018   Procedure: AXILLARY DEBRIDEMENT;  Surgeon: Vickie Epley, MD;  Location: [facility];  Service: General;  Laterality: Left;   AXILLARY LYMPH NODE DISSECTION Left 12/06/2018   Procedure: PALLIATIVE DEBULKING OF AXILLARY TUMOR-LEFT;  Surgeon: Vickie Epley, MD;  Location: [facility];  Service: General;  Laterality: Left;   DIAGNOSTIC LAPAROSCOPY     EMBOLIZATION Left 05/11/2018   Procedure: EMBOLIZATION;  Surgeon: Katha Cabal, MD;  Location: [place] [facility];  Service: Cardiovascular;  Laterality: Left;   LAPAROTOMY N/A 10/14/2017   Procedure: EXPLORATORY LAPAROTOMY;  Surgeon: Clayburn Pert, MD;  Location: [facility];  Service: General;  Laterality: N/A;   VASECTOMY     WOUND DEBRIDEMENT Left 09/24/2018   Procedure: LEFT AXILLARY EXCISIONAL DEBRIDEMENT OF NECROTIC TISSUE;  Surgeon: Vickie Epley, MD;  Location: [facility];  Service: General;  Laterality: Left;    FAMILY HISTORY: Family History  Problem Relation Age of Onset   Diabetes Mother    Hypertension Mother    Leukemia Father    Thyroid disease Sister    Liver disease Maternal Grandmother    Heart Problems Maternal Grandfather    Diabetes Maternal Grandfather    Lung cancer Paternal Grandmother    Prostate cancer Paternal Grandfather     ADVANCED DIRECTIVES (Y/N):  N  HEALTH MAINTENANCE: Social History   Tobacco Use   Smoking status:  Never Smoker   Smokeless tobacco: Never Used  Substance Use Topics   Alcohol use: No   Drug use: No     Colonoscopy:  PAP:  Bone density:  Lipid panel:  No Known Allergies  Current Outpatient Medications  Medication Sig Dispense Refill    amoxicillin-clavulanate (AUGMENTIN) 875-125 MG tablet Take 1 tablet by mouth 2 (two) times daily for 14 days. 28 tablet 0   Binimetinib (MEKTOVI) 15 MG TABS Take 45 mg by mouth 2 (two) times daily. (Patient taking differently: Take 15 mg by mouth 2 (two) times daily. ) 180 tablet 2   Encorafenib 75 MG CAPS Take 450 mg by mouth daily. (Patient taking differently: Take 225 mg by mouth daily. ) 180 capsule 2   fentaNYL (DURAGESIC) 50 MCG/HR Place 1 patch onto the skin every other day. 5 patch 0   ibuprofen (ADVIL,MOTRIN) 200 MG tablet Take 200 mg by mouth every 6 (six) hours as needed for moderate pain.      Multiple Vitamin (MULTIVITAMIN) tablet Take 1 tablet by mouth daily.     ondansetron (ZOFRAN) 8 MG tablet Take 1 tablet (8 mg total) by mouth every 8 (eight) hours as needed for nausea or vomiting. 30 tablet 0   Oxycodone HCl 10 MG TABS Take 1 tablet (10 mg total) by mouth every 4 (four) hours as needed. 60 tablet 0   No current facility-administered medications for this visit.     OBJECTIVE: There were no vitals filed for this visit.   There is no height or weight on file to calculate BMI.    ECOG FS:2 - Symptomatic, <50% confined to bed  General: Ill-appearing, thin, no acute distress. Eyes: Pink conjunctiva, anicteric sclera. HEENT: Normocephalic, moist mucous membranes. Lungs: Clear to auscultation bilaterally. Heart: Regular rate and rhythm. No rubs, murmurs, or gallops. Abdomen: Soft, nontender, nondistended. No organomegaly noted, normoactive bowel sounds. Musculoskeletal: No edema, cyanosis, or clubbing. Neuro: Alert, answering all questions appropriately. Cranial nerves grossly intact. Skin: No rashes or petechiae noted. Psych: Normal affect. Lymphatics: Large left axillary mass.    LAB RESULTS:  Lab Results  Component Value Date   NA 135 12/25/2018   K 3.7 12/25/2018   CL 107 12/25/2018   CO2 23 12/25/2018   GLUCOSE 94 12/25/2018   BUN 18 12/25/2018   CREATININE  0.79 12/25/2018   CALCIUM 7.1 (L) 12/25/2018   PROT 4.3 (L) 12/22/2018   ALBUMIN 1.6 (L) 12/22/2018   AST 23 12/22/2018   ALT 19 12/22/2018   ALKPHOS 63 12/22/2018   BILITOT 0.3 12/22/2018   GFRNONAA >60 12/25/2018   GFRAA >60 12/25/2018    Lab Results  Component Value Date   WBC 14.7 (H) 12/25/2018   NEUTROABS 20.2 (H) 12/22/2018   HGB 9.7 (L) 12/25/2018   HCT 30.4 (L) 12/25/2018   MCV 82.4 12/25/2018   PLT 303 12/25/2018     STUDIES: Ct Angio Chest Pe W And/or Wo Contrast  Result Date: 12/22/2018 EXAM: CT ANGIOGRAPHY CHEST WITH CONTRAST TECHNIQUE: Multidetector CT imaging of the chest was performed using the standard protocol during bolus administration of intravenous contrast. Multiplanar CT image reconstructions and MIPs were obtained to evaluate the vascular anatomy. CONTRAST:  26m OMNIPAQUE IOHEXOL 350 MG/ML SOLN COMPARISON:  PET-CT 10/22/2018 and previous FINDINGS: Cardiovascular: Heart size normal. No pericardial effusion. Dilated central pulmonary arteries. Satisfactory opacification of pulmonary arteries noted, and there is no evidence of pulmonary emboli. Adequate contrast opacification of the thoracic aorta with no evidence of dissection,  aneurysm, or stenosis. There is classic 3-vessel brachiocephalic arch anatomy without proximal stenosis. No significant atheromatous plaque. Mediastinum/Nodes: No hilar or mediastinal adenopathy. Lungs/Pleura: Trace bilateral pleural effusions. No pneumothorax. Dependent atelectasis posteriorly in both lower lobes. Calcified left lower lobe granuloma. 3 mm nonspecific nodules in the right lower lobe image 56/6 and right upper lobe image 52/6. Upper Abdomen: Stable hepatic segment 4A probable cyst. No acute findings. Musculoskeletal: Significant interval enlargement of left axillary mass no measuring at least 19.3 cm, incompletely visualized. Stable adjacent embolization coils. Anterior vertebral endplate spurring at multiple levels in the mid  and lower thoracic spine. No fracture or worrisome bone lesion. Review of the MIP images confirms the above findings. IMPRESSION: 1. Negative for acute PE or thoracic aortic dissection. 2. Trace bilateral pleural effusions. 3. Interval enlargement of left axillary mass, incompletely visualized. Electronically Signed   By: Lucrezia Europe M.D.   On: 12/22/2018 15:59   Dg Chest Port 1 View  Result Date: 12/22/2018 CLINICAL DATA:  Near syncope.  History of melanoma EXAM: PORTABLE CHEST 1 VIEW COMPARISON:  10/15/2017 FINDINGS: Heart size and vascularity normal. Lungs are clear without infiltrate or effusion Large left axillary mass. Embolization coils in the left axillary region. IMPRESSION: No active disease. Electronically Signed   By: Franchot Gallo M.D.   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  PLAN:    1. 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  [place] input.  Patient now going to [facility] for another surgical opinion tomorrow.  He continues to require periodic debridement of his left axillary mass.  He is tolerating dose reduced encorafenib 300 mg daily plus binimetinib 30 mg daily much better.  Baseline cardiac echo on December 01, 2018 revealed an EF of 60 to 65%.  Return to clinic in 2 weeks with repeat laboratory work and further evaluation.   2.  Left axilla: Appreciate surgical input.  Patient has an appointment with surgery later today and is going to [facility] tomorrow for an additional evaluation. 3.  Left arm swelling: Significantly improved.  Continue follow-up with lymphedema clinic. 4.  Poor appetite: Improved.  Continue Megace as prescribed.  Appreciate dietary input. 5.  Anemia: Patient's hemoglobin is 7.0 today proceed with 1 unit packed red blood  cells.  Patient expressed understanding and was in agreement with this plan. He also understands that He can call clinic at any time with any questions, concerns, or complaints.   Cancer Staging Metastatic melanoma [facility]) Staging form: Melanoma of the Skin, AJCC 8th Edition - Clinical stage from 07/15/2017: Stage IV (cTX, cN1, cM1c(0)) - Signed by Lloyd Huger, MD on 07/15/2017   Lloyd Huger, MD   01/03/2019 7:41 AM

## 2019-01-04 ENCOUNTER — Inpatient Hospital Stay: Payer: Medicare Other

## 2019-01-04 ENCOUNTER — Inpatient Hospital Stay: Payer: Medicare Other | Admitting: Hospice and Palliative Medicine

## 2019-01-04 ENCOUNTER — Encounter: Payer: Medicare Other | Admitting: Occupational Therapy

## 2019-01-04 ENCOUNTER — Inpatient Hospital Stay: Payer: Medicare Other | Admitting: Oncology

## 2019-01-05 ENCOUNTER — Ambulatory Visit: Payer: Medicare Other | Admitting: Occupational Therapy

## 2019-01-05 MED ORDER — METRONIDAZOLE 500 MG PO TABS
1000.00 | ORAL_TABLET | ORAL | Status: DC
Start: 2019-01-06 — End: 2019-01-05

## 2019-01-05 MED ORDER — GENERIC EXTERNAL MEDICATION
2.00 | Status: DC
Start: 2019-01-05 — End: 2019-01-05

## 2019-01-05 MED ORDER — GENERIC EXTERNAL MEDICATION
5.00 | Status: DC
Start: ? — End: 2019-01-05

## 2019-01-05 MED ORDER — ACETAMINOPHEN 500 MG PO TABS
1000.00 | ORAL_TABLET | ORAL | Status: DC
Start: 2019-01-05 — End: 2019-01-05

## 2019-01-05 MED ORDER — POLYETHYLENE GLYCOL 3350 17 G PO PACK
17.00 | PACK | ORAL | Status: DC
Start: 2019-01-06 — End: 2019-01-05

## 2019-01-05 MED ORDER — CEFEPIME HCL 2 GM/100ML IV SOLN
2.00 | INTRAVENOUS | Status: DC
Start: 2019-01-05 — End: 2019-01-05

## 2019-01-05 MED ORDER — ONDANSETRON 4 MG PO TBDP
8.00 | ORAL_TABLET | ORAL | Status: DC
Start: ? — End: 2019-01-05

## 2019-01-05 MED ORDER — VANCOMYCIN HCL IN NACL 1-0.9 GM/250ML-% IV SOLN
1000.00 | INTRAVENOUS | Status: DC
Start: 2019-01-05 — End: 2019-01-05

## 2019-01-05 MED ORDER — FENTANYL 50 MCG/HR TD PT72
1.00 | MEDICATED_PATCH | TRANSDERMAL | Status: DC
Start: 2019-01-06 — End: 2019-01-05

## 2019-01-05 MED ORDER — NALOXONE HCL 4 MG/10ML IJ SOLN
.40 | INTRAMUSCULAR | Status: DC
Start: ? — End: 2019-01-05

## 2019-01-05 MED ORDER — GENERIC EXTERNAL MEDICATION
12.50 | Status: DC
Start: ? — End: 2019-01-05

## 2019-01-05 MED ORDER — SODIUM CHLORIDE 0.9 % IV SOLN
150.00 | INTRAVENOUS | Status: DC
Start: ? — End: 2019-01-05

## 2019-01-06 ENCOUNTER — Encounter: Payer: Medicare Other | Admitting: Occupational Therapy

## 2019-01-07 ENCOUNTER — Ambulatory Visit: Payer: Medicare Other | Admitting: Occupational Therapy

## 2019-01-10 ENCOUNTER — Ambulatory Visit: Payer: Medicare Other | Admitting: Occupational Therapy

## 2019-01-11 ENCOUNTER — Encounter: Payer: Medicare Other | Admitting: Occupational Therapy

## 2019-01-13 ENCOUNTER — Encounter: Payer: Medicare Other | Admitting: Occupational Therapy

## 2019-01-14 ENCOUNTER — Encounter: Payer: Medicare Other | Admitting: Occupational Therapy

## 2019-01-18 ENCOUNTER — Encounter: Payer: Medicare Other | Admitting: Occupational Therapy

## 2019-01-20 ENCOUNTER — Encounter: Payer: Medicare Other | Admitting: Occupational Therapy

## 2019-01-21 ENCOUNTER — Other Ambulatory Visit: Payer: Self-pay | Admitting: Pharmacist

## 2019-01-21 DIAGNOSIS — R600 Localized edema: Secondary | ICD-10-CM | POA: Diagnosis not present

## 2019-01-21 DIAGNOSIS — K5900 Constipation, unspecified: Secondary | ICD-10-CM | POA: Diagnosis not present

## 2019-01-21 DIAGNOSIS — C439 Malignant melanoma of skin, unspecified: Secondary | ICD-10-CM

## 2019-01-21 DIAGNOSIS — Z89232 Acquired absence of left shoulder: Secondary | ICD-10-CM | POA: Diagnosis not present

## 2019-01-21 DIAGNOSIS — L89153 Pressure ulcer of sacral region, stage 3: Secondary | ICD-10-CM | POA: Diagnosis not present

## 2019-01-21 DIAGNOSIS — R350 Frequency of micturition: Secondary | ICD-10-CM | POA: Diagnosis not present

## 2019-01-21 DIAGNOSIS — Z4781 Encounter for orthopedic aftercare following surgical amputation: Secondary | ICD-10-CM | POA: Diagnosis not present

## 2019-01-21 DIAGNOSIS — C799 Secondary malignant neoplasm of unspecified site: Secondary | ICD-10-CM | POA: Diagnosis not present

## 2019-01-21 DIAGNOSIS — D649 Anemia, unspecified: Secondary | ICD-10-CM | POA: Diagnosis present

## 2019-01-21 DIAGNOSIS — R339 Retention of urine, unspecified: Secondary | ICD-10-CM | POA: Diagnosis not present

## 2019-01-21 DIAGNOSIS — G8918 Other acute postprocedural pain: Secondary | ICD-10-CM | POA: Diagnosis not present

## 2019-01-21 DIAGNOSIS — Z859 Personal history of malignant neoplasm, unspecified: Secondary | ICD-10-CM | POA: Diagnosis not present

## 2019-01-21 DIAGNOSIS — C773 Secondary and unspecified malignant neoplasm of axilla and upper limb lymph nodes: Secondary | ICD-10-CM | POA: Diagnosis not present

## 2019-01-21 DIAGNOSIS — Z9221 Personal history of antineoplastic chemotherapy: Secondary | ICD-10-CM | POA: Diagnosis not present

## 2019-01-21 DIAGNOSIS — R638 Other symptoms and signs concerning food and fluid intake: Secondary | ICD-10-CM | POA: Diagnosis not present

## 2019-01-21 DIAGNOSIS — E039 Hypothyroidism, unspecified: Secondary | ICD-10-CM | POA: Diagnosis present

## 2019-01-21 DIAGNOSIS — L89893 Pressure ulcer of other site, stage 3: Secondary | ICD-10-CM | POA: Diagnosis not present

## 2019-01-21 DIAGNOSIS — L89152 Pressure ulcer of sacral region, stage 2: Secondary | ICD-10-CM | POA: Diagnosis present

## 2019-01-21 MED ORDER — BINIMETINIB 15 MG PO TABS: 15.0000 mg | ORAL_TABLET | Freq: Two times a day (BID) | ORAL | 2 refills | Status: AC

## 2019-01-21 MED ORDER — ENCORAFENIB 75 MG PO CAPS: 300.0000 mg | ORAL_CAPSULE | Freq: Every day | ORAL | 2 refills | Status: AC

## 2019-01-21 MED ORDER — MIRTAZAPINE 15 MG PO TABS
15.00 | ORAL_TABLET | ORAL | Status: DC
Start: 2019-01-21 — End: 2019-01-21

## 2019-01-21 MED ORDER — GENERIC EXTERNAL MEDICATION
Status: DC
Start: 2019-01-21 — End: 2019-01-21

## 2019-01-21 MED ORDER — ENCORAFENIB 75 MG PO CAPS
300.00 | ORAL_CAPSULE | ORAL | Status: DC
Start: 2019-01-22 — End: 2019-01-21

## 2019-01-21 MED ORDER — HEPARIN SODIUM (PORCINE) 5000 UNIT/ML IJ SOLN
5000.00 | INTRAMUSCULAR | Status: DC
Start: 2019-01-21 — End: 2019-01-21

## 2019-01-21 MED ORDER — AQUADEKS PO CHEW
1.00 | CHEWABLE_TABLET | ORAL | Status: DC
Start: 2019-01-22 — End: 2019-01-21

## 2019-01-21 MED ORDER — GENERIC EXTERNAL MEDICATION
Status: DC
Start: ? — End: 2019-01-21

## 2019-01-21 MED ORDER — GENERIC EXTERNAL MEDICATION
250.00 | Status: DC
Start: 2019-01-22 — End: 2019-01-21

## 2019-01-21 MED ORDER — SODIUM CHLORIDE 0.9 % IV SOLN
INTRAVENOUS | Status: DC
Start: ? — End: 2019-01-21

## 2019-01-21 MED ORDER — TAMSULOSIN HCL 0.4 MG PO CAPS
0.40 | ORAL_CAPSULE | ORAL | Status: DC
Start: 2019-01-21 — End: 2019-01-21

## 2019-01-21 MED ORDER — LEVOTHYROXINE SODIUM 75 MCG PO TABS
75.00 | ORAL_TABLET | ORAL | Status: DC
Start: 2019-01-22 — End: 2019-01-21

## 2019-01-21 MED ORDER — BINIMETINIB 15 MG PO TABS
30.00 | ORAL_TABLET | ORAL | Status: DC
Start: 2019-01-22 — End: 2019-01-21

## 2019-01-21 MED ORDER — METOCLOPRAMIDE HCL 10 MG PO TABS
5.00 | ORAL_TABLET | ORAL | Status: DC
Start: 2019-01-21 — End: 2019-01-21

## 2019-01-21 MED ORDER — FERROUS SULFATE 325 (65 FE) MG PO TABS
325.00 | ORAL_TABLET | ORAL | Status: DC
Start: 2019-01-22 — End: 2019-01-21

## 2019-01-21 MED ORDER — VALCHLOR EX
1.00 | CUTANEOUS | Status: DC
Start: ? — End: 2019-01-21

## 2019-01-21 MED ORDER — ACETAMINOPHEN 500 MG PO TABS
1000.00 | ORAL_TABLET | ORAL | Status: DC
Start: ? — End: 2019-01-21

## 2019-01-21 MED ORDER — BISACODYL 10 MG RE SUPP
10.00 | RECTAL | Status: DC
Start: ? — End: 2019-01-21

## 2019-01-21 MED ORDER — GENERIC EXTERNAL MEDICATION
1000.00 | Status: DC
Start: 2019-01-22 — End: 2019-01-21

## 2019-01-21 MED ORDER — VITAMIN B-COMPLEX PO TABS
1.00 | ORAL_TABLET | ORAL | Status: DC
Start: 2019-01-22 — End: 2019-01-21

## 2019-01-21 MED ORDER — ASPIRIN 81 MG PO CHEW
81.00 | CHEWABLE_TABLET | ORAL | Status: DC
Start: 2019-01-22 — End: 2019-01-21

## 2019-01-21 MED ORDER — ONDANSETRON 4 MG PO TBDP
4.00 | ORAL_TABLET | ORAL | Status: DC
Start: ? — End: 2019-01-21

## 2019-01-25 ENCOUNTER — Encounter: Payer: Medicare Other | Admitting: Occupational Therapy

## 2019-01-27 ENCOUNTER — Encounter: Payer: Medicare Other | Admitting: Occupational Therapy

## 2019-01-27 MED ORDER — TAMSULOSIN HCL 0.4 MG PO CAPS
0.40 | ORAL_CAPSULE | ORAL | Status: DC
Start: 2019-02-03 — End: 2019-01-27

## 2019-01-27 MED ORDER — LEVOTHYROXINE SODIUM 75 MCG PO TABS
75.00 | ORAL_TABLET | ORAL | Status: DC
Start: 2019-02-04 — End: 2019-01-27

## 2019-01-27 MED ORDER — GENERIC EXTERNAL MEDICATION
Status: DC
Start: ? — End: 2019-01-27

## 2019-01-27 MED ORDER — FERROUS SULFATE 325 (65 FE) MG PO TABS
325.00 | ORAL_TABLET | ORAL | Status: DC
Start: 2019-02-04 — End: 2019-01-27

## 2019-01-27 MED ORDER — BIOTENE DRY MOUTH MOISTURIZING MT SOLN
1.00 | OROMUCOSAL | Status: DC
Start: ? — End: 2019-01-27

## 2019-01-27 MED ORDER — ACETAMINOPHEN 500 MG PO TABS
1000.00 | ORAL_TABLET | ORAL | Status: DC
Start: ? — End: 2019-01-27

## 2019-01-27 MED ORDER — FIRST-MOUTHWASH BLM MT SUSP
10.00 | OROMUCOSAL | Status: DC
Start: ? — End: 2019-01-27

## 2019-01-27 MED ORDER — ONDANSETRON 4 MG PO TBDP
4.00 | ORAL_TABLET | ORAL | Status: DC
Start: ? — End: 2019-01-27

## 2019-01-27 MED ORDER — HEPARIN SODIUM (PORCINE) 5000 UNIT/ML IJ SOLN
5000.00 | INTRAMUSCULAR | Status: DC
Start: 2019-02-03 — End: 2019-01-27

## 2019-01-27 MED ORDER — AQUADEKS PO CHEW
1.00 | CHEWABLE_TABLET | ORAL | Status: DC
Start: 2019-02-04 — End: 2019-01-27

## 2019-01-27 MED ORDER — ASPIRIN 81 MG PO CHEW
81.00 | CHEWABLE_TABLET | ORAL | Status: DC
Start: 2019-02-04 — End: 2019-01-27

## 2019-01-27 MED ORDER — MIRTAZAPINE 15 MG PO TABS
15.00 | ORAL_TABLET | ORAL | Status: DC
Start: 2019-02-03 — End: 2019-01-27

## 2019-01-27 MED ORDER — VITAMIN B-COMPLEX PO TABS
1.00 | ORAL_TABLET | ORAL | Status: DC
Start: 2019-02-04 — End: 2019-01-27

## 2019-01-27 MED ORDER — BINIMETINIB 15 MG PO TABS
30.00 | ORAL_TABLET | ORAL | Status: DC
Start: 2019-02-04 — End: 2019-01-27

## 2019-01-27 MED ORDER — ENCORAFENIB 75 MG PO CAPS
300.00 | ORAL_CAPSULE | ORAL | Status: DC
Start: 2019-02-04 — End: 2019-01-27

## 2019-01-27 MED ORDER — METOCLOPRAMIDE HCL 10 MG PO TABS
5.00 | ORAL_TABLET | ORAL | Status: DC
Start: 2019-02-03 — End: 2019-01-27

## 2019-01-27 MED ORDER — DICLOFENAC SODIUM 1 % TD GEL
2.00 | TRANSDERMAL | Status: DC
Start: ? — End: 2019-01-27

## 2019-01-27 MED ORDER — MELATONIN 3 MG PO TABS
3.00 | ORAL_TABLET | ORAL | Status: DC
Start: 2019-02-03 — End: 2019-01-27

## 2019-01-27 MED ORDER — INFLUENZA VAC SPLIT QUAD 0.5 ML IM SUSY
0.50 | PREFILLED_SYRINGE | INTRAMUSCULAR | Status: DC
Start: ? — End: 2019-01-27

## 2019-01-27 MED ORDER — BISACODYL 10 MG RE SUPP
10.00 | RECTAL | Status: DC
Start: ? — End: 2019-01-27

## 2019-01-28 ENCOUNTER — Inpatient Hospital Stay: Payer: Medicare Other

## 2019-01-28 ENCOUNTER — Inpatient Hospital Stay: Payer: Medicare Other | Admitting: Oncology

## 2019-02-01 ENCOUNTER — Encounter: Payer: Medicare Other | Admitting: Occupational Therapy

## 2019-02-03 ENCOUNTER — Encounter: Payer: Medicare Other | Admitting: Occupational Therapy

## 2019-02-04 ENCOUNTER — Other Ambulatory Visit: Payer: Self-pay | Admitting: Oncology

## 2019-02-04 ENCOUNTER — Telehealth: Payer: Self-pay

## 2019-02-04 DIAGNOSIS — Z79899 Other long term (current) drug therapy: Secondary | ICD-10-CM | POA: Diagnosis not present

## 2019-02-04 DIAGNOSIS — I10 Essential (primary) hypertension: Secondary | ICD-10-CM | POA: Diagnosis not present

## 2019-02-04 DIAGNOSIS — Z4781 Encounter for orthopedic aftercare following surgical amputation: Secondary | ICD-10-CM | POA: Diagnosis not present

## 2019-02-04 DIAGNOSIS — D649 Anemia, unspecified: Secondary | ICD-10-CM | POA: Diagnosis not present

## 2019-02-04 DIAGNOSIS — Z89222 Acquired absence of left upper limb above elbow: Secondary | ICD-10-CM | POA: Diagnosis not present

## 2019-02-04 DIAGNOSIS — L89152 Pressure ulcer of sacral region, stage 2: Secondary | ICD-10-CM | POA: Diagnosis not present

## 2019-02-04 DIAGNOSIS — Z7982 Long term (current) use of aspirin: Secondary | ICD-10-CM | POA: Diagnosis not present

## 2019-02-04 DIAGNOSIS — C799 Secondary malignant neoplasm of unspecified site: Secondary | ICD-10-CM | POA: Diagnosis not present

## 2019-02-04 MED ORDER — MELATONIN 3 MG PO TABS
3.00 | ORAL_TABLET | ORAL | Status: DC
Start: ? — End: 2019-02-04

## 2019-02-04 MED ORDER — AMLODIPINE BESYLATE 5 MG PO TABS
5.00 | ORAL_TABLET | ORAL | Status: DC
Start: 2019-02-04 — End: 2019-02-04

## 2019-02-04 MED ORDER — OXYCODONE HCL 10 MG PO TABS: 10.0000 mg | ORAL_TABLET | ORAL | 0 refills | Status: AC | PRN

## 2019-02-04 MED ORDER — GENERIC EXTERNAL MEDICATION
Status: DC
Start: 2019-02-03 — End: 2019-02-04

## 2019-02-04 NOTE — Telephone Encounter (Signed)
Yes, we can.

## 2019-02-04 NOTE — Telephone Encounter (Signed)
Patient was notified that his prescription was going to be sent to [facility] in [place]. Patient was also told that he could take Acetaminophen 3000 MG every 24 hours if needed. Patient stated that he was okay taking 500 MG of Tylenol every 8 hours. Patient had no further questions at this time.

## 2019-02-04 NOTE — Telephone Encounter (Signed)
Patient's girlfriend-Frances called stating that the patient was currently hospitalized at [facility] and was discharged yesterday. However, he was given all types of medications except for pain. Joaquim Lai stated that he had been with excrutiating pain and would like to know if there was any way that he could a refill on his Oxycodone 10 MG and a higher dose of his Acetaminophen.

## 2019-02-05 DIAGNOSIS — C799 Secondary malignant neoplasm of unspecified site: Secondary | ICD-10-CM | POA: Diagnosis not present

## 2019-02-05 DIAGNOSIS — Z4781 Encounter for orthopedic aftercare following surgical amputation: Secondary | ICD-10-CM | POA: Diagnosis not present

## 2019-02-05 DIAGNOSIS — L89152 Pressure ulcer of sacral region, stage 2: Secondary | ICD-10-CM | POA: Diagnosis not present

## 2019-02-05 DIAGNOSIS — I10 Essential (primary) hypertension: Secondary | ICD-10-CM | POA: Diagnosis not present

## 2019-02-05 DIAGNOSIS — D649 Anemia, unspecified: Secondary | ICD-10-CM | POA: Diagnosis not present

## 2019-02-05 DIAGNOSIS — Z89222 Acquired absence of left upper limb above elbow: Secondary | ICD-10-CM | POA: Diagnosis not present

## 2019-02-06 DIAGNOSIS — Z89222 Acquired absence of left upper limb above elbow: Secondary | ICD-10-CM | POA: Diagnosis not present

## 2019-02-06 DIAGNOSIS — L89152 Pressure ulcer of sacral region, stage 2: Secondary | ICD-10-CM | POA: Diagnosis not present

## 2019-02-06 DIAGNOSIS — I10 Essential (primary) hypertension: Secondary | ICD-10-CM | POA: Diagnosis not present

## 2019-02-06 DIAGNOSIS — D649 Anemia, unspecified: Secondary | ICD-10-CM | POA: Diagnosis not present

## 2019-02-06 DIAGNOSIS — C799 Secondary malignant neoplasm of unspecified site: Secondary | ICD-10-CM | POA: Diagnosis not present

## 2019-02-06 DIAGNOSIS — Z4781 Encounter for orthopedic aftercare following surgical amputation: Secondary | ICD-10-CM | POA: Diagnosis not present

## 2019-02-07 ENCOUNTER — Encounter: Payer: Self-pay | Admitting: Occupational Therapy

## 2019-02-07 NOTE — Therapy (Signed)
Spoke with Louis Davies by phone today in effort to check in and provide follow along support for [facility] clinical lymphedema management and LE self-care. Pt has not been seen for OTLE services since undergoing L forequarter amputation w shoulder disarticulation ~ 3 weeks ago. Pt was released from hospital late last week and is receiving nursing, OTand PT home health services at home.  Pt reports pain is typically well controlled with tylenol, although he did take narcotic meds yesterday to extend tolerance during family.  Visit. Pt reports he walked outside on a paved, level surface ~ 700' using a cane for balance with periodic rest breaks and SBA from his son.Pt feels recovery is going as expected. He agrees to call OT PRN should questions and/or concerns related to lymphedema, wound healing and/or infection arise.   Andrey Spearman, MS, OTR/L, CLT-LANA 02/07/19 2:23 PM

## 2019-02-08 ENCOUNTER — Encounter: Payer: Medicare Other | Admitting: Occupational Therapy

## 2019-02-08 DIAGNOSIS — C7989 Secondary malignant neoplasm of other specified sites: Secondary | ICD-10-CM | POA: Diagnosis not present

## 2019-02-08 DIAGNOSIS — Z89232 Acquired absence of left shoulder: Secondary | ICD-10-CM | POA: Diagnosis not present

## 2019-02-08 DIAGNOSIS — I10 Essential (primary) hypertension: Secondary | ICD-10-CM | POA: Diagnosis not present

## 2019-02-08 DIAGNOSIS — R600 Localized edema: Secondary | ICD-10-CM | POA: Diagnosis not present

## 2019-02-08 DIAGNOSIS — C7951 Secondary malignant neoplasm of bone: Secondary | ICD-10-CM | POA: Diagnosis not present

## 2019-02-08 DIAGNOSIS — Z79899 Other long term (current) drug therapy: Secondary | ICD-10-CM | POA: Diagnosis not present

## 2019-02-08 DIAGNOSIS — E039 Hypothyroidism, unspecified: Secondary | ICD-10-CM | POA: Diagnosis not present

## 2019-02-08 DIAGNOSIS — R262 Difficulty in walking, not elsewhere classified: Secondary | ICD-10-CM | POA: Diagnosis not present

## 2019-02-08 DIAGNOSIS — Z7982 Long term (current) use of aspirin: Secondary | ICD-10-CM | POA: Diagnosis not present

## 2019-02-08 DIAGNOSIS — C439 Malignant melanoma of skin, unspecified: Secondary | ICD-10-CM | POA: Diagnosis not present

## 2019-02-08 DIAGNOSIS — D630 Anemia in neoplastic disease: Secondary | ICD-10-CM | POA: Diagnosis not present

## 2019-02-09 DIAGNOSIS — C7989 Secondary malignant neoplasm of other specified sites: Secondary | ICD-10-CM | POA: Diagnosis not present

## 2019-02-09 DIAGNOSIS — Z89232 Acquired absence of left shoulder: Secondary | ICD-10-CM | POA: Diagnosis not present

## 2019-02-09 DIAGNOSIS — D630 Anemia in neoplastic disease: Secondary | ICD-10-CM | POA: Diagnosis not present

## 2019-02-09 DIAGNOSIS — R600 Localized edema: Secondary | ICD-10-CM | POA: Diagnosis not present

## 2019-02-09 DIAGNOSIS — C439 Malignant melanoma of skin, unspecified: Secondary | ICD-10-CM | POA: Diagnosis not present

## 2019-02-09 DIAGNOSIS — C7951 Secondary malignant neoplasm of bone: Secondary | ICD-10-CM | POA: Diagnosis not present

## 2019-02-10 ENCOUNTER — Encounter: Payer: Medicare Other | Admitting: Occupational Therapy

## 2019-02-11 DIAGNOSIS — L89152 Pressure ulcer of sacral region, stage 2: Secondary | ICD-10-CM | POA: Diagnosis not present

## 2019-02-11 DIAGNOSIS — Z4781 Encounter for orthopedic aftercare following surgical amputation: Secondary | ICD-10-CM | POA: Diagnosis not present

## 2019-02-11 DIAGNOSIS — C799 Secondary malignant neoplasm of unspecified site: Secondary | ICD-10-CM | POA: Diagnosis not present

## 2019-02-11 DIAGNOSIS — Z89222 Acquired absence of left upper limb above elbow: Secondary | ICD-10-CM | POA: Diagnosis not present

## 2019-02-11 DIAGNOSIS — I10 Essential (primary) hypertension: Secondary | ICD-10-CM | POA: Diagnosis not present

## 2019-02-11 DIAGNOSIS — D649 Anemia, unspecified: Secondary | ICD-10-CM | POA: Diagnosis not present

## 2019-02-14 DIAGNOSIS — C799 Secondary malignant neoplasm of unspecified site: Secondary | ICD-10-CM | POA: Diagnosis not present

## 2019-02-14 DIAGNOSIS — Z4781 Encounter for orthopedic aftercare following surgical amputation: Secondary | ICD-10-CM | POA: Diagnosis not present

## 2019-02-14 DIAGNOSIS — D649 Anemia, unspecified: Secondary | ICD-10-CM | POA: Diagnosis not present

## 2019-02-14 DIAGNOSIS — L89152 Pressure ulcer of sacral region, stage 2: Secondary | ICD-10-CM | POA: Diagnosis not present

## 2019-02-14 DIAGNOSIS — Z89222 Acquired absence of left upper limb above elbow: Secondary | ICD-10-CM | POA: Diagnosis not present

## 2019-02-14 DIAGNOSIS — I10 Essential (primary) hypertension: Secondary | ICD-10-CM | POA: Diagnosis not present

## 2019-02-15 ENCOUNTER — Encounter: Payer: Medicare Other | Admitting: Occupational Therapy

## 2019-02-16 DIAGNOSIS — C799 Secondary malignant neoplasm of unspecified site: Secondary | ICD-10-CM | POA: Diagnosis not present

## 2019-02-16 DIAGNOSIS — Z4781 Encounter for orthopedic aftercare following surgical amputation: Secondary | ICD-10-CM | POA: Diagnosis not present

## 2019-02-16 DIAGNOSIS — Z89222 Acquired absence of left upper limb above elbow: Secondary | ICD-10-CM | POA: Diagnosis not present

## 2019-02-16 DIAGNOSIS — L89152 Pressure ulcer of sacral region, stage 2: Secondary | ICD-10-CM | POA: Diagnosis not present

## 2019-02-16 DIAGNOSIS — I10 Essential (primary) hypertension: Secondary | ICD-10-CM | POA: Diagnosis not present

## 2019-02-16 DIAGNOSIS — D649 Anemia, unspecified: Secondary | ICD-10-CM | POA: Diagnosis not present

## 2019-02-17 ENCOUNTER — Encounter: Payer: Medicare Other | Admitting: Occupational Therapy

## 2019-02-17 DIAGNOSIS — D649 Anemia, unspecified: Secondary | ICD-10-CM | POA: Diagnosis not present

## 2019-02-17 DIAGNOSIS — Z89222 Acquired absence of left upper limb above elbow: Secondary | ICD-10-CM | POA: Diagnosis not present

## 2019-02-17 DIAGNOSIS — L89152 Pressure ulcer of sacral region, stage 2: Secondary | ICD-10-CM | POA: Diagnosis not present

## 2019-02-17 DIAGNOSIS — C799 Secondary malignant neoplasm of unspecified site: Secondary | ICD-10-CM | POA: Diagnosis not present

## 2019-02-17 DIAGNOSIS — Z4781 Encounter for orthopedic aftercare following surgical amputation: Secondary | ICD-10-CM | POA: Diagnosis not present

## 2019-02-17 DIAGNOSIS — I10 Essential (primary) hypertension: Secondary | ICD-10-CM | POA: Diagnosis not present

## 2019-02-18 ENCOUNTER — Telehealth: Payer: Self-pay

## 2019-02-18 DIAGNOSIS — Z89222 Acquired absence of left upper limb above elbow: Secondary | ICD-10-CM | POA: Diagnosis not present

## 2019-02-18 DIAGNOSIS — Z4781 Encounter for orthopedic aftercare following surgical amputation: Secondary | ICD-10-CM | POA: Diagnosis not present

## 2019-02-18 DIAGNOSIS — C799 Secondary malignant neoplasm of unspecified site: Secondary | ICD-10-CM | POA: Diagnosis not present

## 2019-02-18 DIAGNOSIS — I10 Essential (primary) hypertension: Secondary | ICD-10-CM | POA: Diagnosis not present

## 2019-02-18 DIAGNOSIS — L89152 Pressure ulcer of sacral region, stage 2: Secondary | ICD-10-CM | POA: Diagnosis not present

## 2019-02-18 DIAGNOSIS — D649 Anemia, unspecified: Secondary | ICD-10-CM | POA: Diagnosis not present

## 2019-02-18 NOTE — Telephone Encounter (Signed)
Patient called to let us know that he will no longer be coming to our office since he transferred his care to [facility] per his surgeons recommendations. Patient was informed to contact us if he had any questions. Patient understood.

## 2019-02-21 DIAGNOSIS — I10 Essential (primary) hypertension: Secondary | ICD-10-CM | POA: Diagnosis not present

## 2019-02-21 DIAGNOSIS — Z4781 Encounter for orthopedic aftercare following surgical amputation: Secondary | ICD-10-CM | POA: Diagnosis not present

## 2019-02-21 DIAGNOSIS — D649 Anemia, unspecified: Secondary | ICD-10-CM | POA: Diagnosis not present

## 2019-02-21 DIAGNOSIS — L89152 Pressure ulcer of sacral region, stage 2: Secondary | ICD-10-CM | POA: Diagnosis not present

## 2019-02-21 DIAGNOSIS — Z89222 Acquired absence of left upper limb above elbow: Secondary | ICD-10-CM | POA: Diagnosis not present

## 2019-02-21 DIAGNOSIS — C799 Secondary malignant neoplasm of unspecified site: Secondary | ICD-10-CM | POA: Diagnosis not present

## 2019-02-22 ENCOUNTER — Encounter: Payer: Medicare Other | Admitting: Occupational Therapy

## 2019-02-22 ENCOUNTER — Inpatient Hospital Stay: Payer: Medicare Other

## 2019-02-22 ENCOUNTER — Inpatient Hospital Stay: Payer: Medicare Other | Admitting: Oncology

## 2019-02-22 DIAGNOSIS — L89152 Pressure ulcer of sacral region, stage 2: Secondary | ICD-10-CM | POA: Diagnosis not present

## 2019-02-22 DIAGNOSIS — C799 Secondary malignant neoplasm of unspecified site: Secondary | ICD-10-CM | POA: Diagnosis not present

## 2019-02-22 DIAGNOSIS — Z89222 Acquired absence of left upper limb above elbow: Secondary | ICD-10-CM | POA: Diagnosis not present

## 2019-02-22 DIAGNOSIS — I10 Essential (primary) hypertension: Secondary | ICD-10-CM | POA: Diagnosis not present

## 2019-02-22 DIAGNOSIS — Z4781 Encounter for orthopedic aftercare following surgical amputation: Secondary | ICD-10-CM | POA: Diagnosis not present

## 2019-02-22 DIAGNOSIS — D649 Anemia, unspecified: Secondary | ICD-10-CM | POA: Diagnosis not present

## 2019-02-23 DIAGNOSIS — Z09 Encounter for follow-up examination after completed treatment for conditions other than malignant neoplasm: Secondary | ICD-10-CM | POA: Diagnosis not present

## 2019-02-23 DIAGNOSIS — Z89222 Acquired absence of left upper limb above elbow: Secondary | ICD-10-CM | POA: Diagnosis not present

## 2019-02-23 DIAGNOSIS — I10 Essential (primary) hypertension: Secondary | ICD-10-CM | POA: Diagnosis not present

## 2019-02-23 DIAGNOSIS — D649 Anemia, unspecified: Secondary | ICD-10-CM | POA: Diagnosis not present

## 2019-02-23 DIAGNOSIS — Z4781 Encounter for orthopedic aftercare following surgical amputation: Secondary | ICD-10-CM | POA: Diagnosis not present

## 2019-02-23 DIAGNOSIS — C799 Secondary malignant neoplasm of unspecified site: Secondary | ICD-10-CM | POA: Diagnosis not present

## 2019-02-23 DIAGNOSIS — L89152 Pressure ulcer of sacral region, stage 2: Secondary | ICD-10-CM | POA: Diagnosis not present

## 2019-02-24 ENCOUNTER — Encounter: Payer: Medicare Other | Admitting: Occupational Therapy

## 2019-02-24 DIAGNOSIS — C799 Secondary malignant neoplasm of unspecified site: Secondary | ICD-10-CM | POA: Diagnosis not present

## 2019-02-24 DIAGNOSIS — Z4781 Encounter for orthopedic aftercare following surgical amputation: Secondary | ICD-10-CM | POA: Diagnosis not present

## 2019-02-24 DIAGNOSIS — Z89222 Acquired absence of left upper limb above elbow: Secondary | ICD-10-CM | POA: Diagnosis not present

## 2019-02-24 DIAGNOSIS — I10 Essential (primary) hypertension: Secondary | ICD-10-CM | POA: Diagnosis not present

## 2019-02-24 DIAGNOSIS — L89152 Pressure ulcer of sacral region, stage 2: Secondary | ICD-10-CM | POA: Diagnosis not present

## 2019-02-24 DIAGNOSIS — D649 Anemia, unspecified: Secondary | ICD-10-CM | POA: Diagnosis not present

## 2019-02-25 DIAGNOSIS — C799 Secondary malignant neoplasm of unspecified site: Secondary | ICD-10-CM | POA: Diagnosis not present

## 2019-02-25 DIAGNOSIS — I10 Essential (primary) hypertension: Secondary | ICD-10-CM | POA: Diagnosis not present

## 2019-02-25 DIAGNOSIS — Z4781 Encounter for orthopedic aftercare following surgical amputation: Secondary | ICD-10-CM | POA: Diagnosis not present

## 2019-02-25 DIAGNOSIS — Z89222 Acquired absence of left upper limb above elbow: Secondary | ICD-10-CM | POA: Diagnosis not present

## 2019-02-25 DIAGNOSIS — L89152 Pressure ulcer of sacral region, stage 2: Secondary | ICD-10-CM | POA: Diagnosis not present

## 2019-02-25 DIAGNOSIS — D649 Anemia, unspecified: Secondary | ICD-10-CM | POA: Diagnosis not present

## 2019-02-28 DIAGNOSIS — I10 Essential (primary) hypertension: Secondary | ICD-10-CM | POA: Diagnosis not present

## 2019-02-28 DIAGNOSIS — Z4781 Encounter for orthopedic aftercare following surgical amputation: Secondary | ICD-10-CM | POA: Diagnosis not present

## 2019-02-28 DIAGNOSIS — L89152 Pressure ulcer of sacral region, stage 2: Secondary | ICD-10-CM | POA: Diagnosis not present

## 2019-02-28 DIAGNOSIS — Z89222 Acquired absence of left upper limb above elbow: Secondary | ICD-10-CM | POA: Diagnosis not present

## 2019-02-28 DIAGNOSIS — C799 Secondary malignant neoplasm of unspecified site: Secondary | ICD-10-CM | POA: Diagnosis not present

## 2019-02-28 DIAGNOSIS — D649 Anemia, unspecified: Secondary | ICD-10-CM | POA: Diagnosis not present

## 2019-03-01 ENCOUNTER — Encounter: Payer: Medicare Other | Admitting: Occupational Therapy

## 2019-03-03 ENCOUNTER — Encounter: Payer: Medicare Other | Admitting: Occupational Therapy

## 2019-03-03 DIAGNOSIS — R918 Other nonspecific abnormal finding of lung field: Secondary | ICD-10-CM | POA: Diagnosis not present

## 2019-03-03 DIAGNOSIS — C4362 Malignant melanoma of left upper limb, including shoulder: Secondary | ICD-10-CM | POA: Diagnosis present

## 2019-03-06 DIAGNOSIS — D649 Anemia, unspecified: Secondary | ICD-10-CM | POA: Diagnosis not present

## 2019-03-06 DIAGNOSIS — I10 Essential (primary) hypertension: Secondary | ICD-10-CM | POA: Diagnosis not present

## 2019-03-06 DIAGNOSIS — C4362 Malignant melanoma of left upper limb, including shoulder: Secondary | ICD-10-CM | POA: Diagnosis not present

## 2019-03-06 DIAGNOSIS — Z7982 Long term (current) use of aspirin: Secondary | ICD-10-CM | POA: Diagnosis not present

## 2019-03-06 DIAGNOSIS — Z79899 Other long term (current) drug therapy: Secondary | ICD-10-CM | POA: Diagnosis not present

## 2019-03-06 DIAGNOSIS — L89152 Pressure ulcer of sacral region, stage 2: Secondary | ICD-10-CM | POA: Diagnosis not present

## 2019-03-06 DIAGNOSIS — Z89222 Acquired absence of left upper limb above elbow: Secondary | ICD-10-CM | POA: Diagnosis not present

## 2019-03-06 DIAGNOSIS — Z483 Aftercare following surgery for neoplasm: Secondary | ICD-10-CM | POA: Diagnosis not present

## 2019-03-07 DIAGNOSIS — Z1159 Encounter for screening for other viral diseases: Secondary | ICD-10-CM | POA: Diagnosis not present

## 2019-03-08 DIAGNOSIS — C799 Secondary malignant neoplasm of unspecified site: Secondary | ICD-10-CM | POA: Diagnosis not present

## 2019-03-08 DIAGNOSIS — C439 Malignant melanoma of skin, unspecified: Secondary | ICD-10-CM | POA: Diagnosis not present

## 2019-03-08 DIAGNOSIS — C7989 Secondary malignant neoplasm of other specified sites: Secondary | ICD-10-CM | POA: Diagnosis not present

## 2019-03-08 DIAGNOSIS — T8189XA Other complications of procedures, not elsewhere classified, initial encounter: Secondary | ICD-10-CM | POA: Diagnosis not present

## 2019-03-08 DIAGNOSIS — C4362 Malignant melanoma of left upper limb, including shoulder: Secondary | ICD-10-CM | POA: Diagnosis not present

## 2019-03-08 DIAGNOSIS — E039 Hypothyroidism, unspecified: Secondary | ICD-10-CM | POA: Diagnosis not present

## 2019-03-09 DIAGNOSIS — T8189XA Other complications of procedures, not elsewhere classified, initial encounter: Secondary | ICD-10-CM | POA: Diagnosis not present

## 2019-03-09 DIAGNOSIS — E039 Hypothyroidism, unspecified: Secondary | ICD-10-CM | POA: Diagnosis not present

## 2019-03-09 DIAGNOSIS — C4362 Malignant melanoma of left upper limb, including shoulder: Secondary | ICD-10-CM | POA: Diagnosis not present

## 2019-03-09 DIAGNOSIS — C7989 Secondary malignant neoplasm of other specified sites: Secondary | ICD-10-CM | POA: Diagnosis not present

## 2019-03-10 DIAGNOSIS — E039 Hypothyroidism, unspecified: Secondary | ICD-10-CM | POA: Diagnosis not present

## 2019-03-10 DIAGNOSIS — T8189XA Other complications of procedures, not elsewhere classified, initial encounter: Secondary | ICD-10-CM | POA: Diagnosis not present

## 2019-03-10 DIAGNOSIS — C7989 Secondary malignant neoplasm of other specified sites: Secondary | ICD-10-CM | POA: Diagnosis not present

## 2019-03-10 DIAGNOSIS — C4362 Malignant melanoma of left upper limb, including shoulder: Secondary | ICD-10-CM | POA: Diagnosis not present

## 2019-03-11 DIAGNOSIS — D649 Anemia, unspecified: Secondary | ICD-10-CM | POA: Diagnosis not present

## 2019-03-11 DIAGNOSIS — Z483 Aftercare following surgery for neoplasm: Secondary | ICD-10-CM | POA: Diagnosis not present

## 2019-03-11 DIAGNOSIS — C4362 Malignant melanoma of left upper limb, including shoulder: Secondary | ICD-10-CM | POA: Diagnosis not present

## 2019-03-11 DIAGNOSIS — Z89222 Acquired absence of left upper limb above elbow: Secondary | ICD-10-CM | POA: Diagnosis not present

## 2019-03-11 DIAGNOSIS — L89152 Pressure ulcer of sacral region, stage 2: Secondary | ICD-10-CM | POA: Diagnosis not present

## 2019-03-11 DIAGNOSIS — I10 Essential (primary) hypertension: Secondary | ICD-10-CM | POA: Diagnosis not present

## 2019-03-14 DIAGNOSIS — L89152 Pressure ulcer of sacral region, stage 2: Secondary | ICD-10-CM | POA: Diagnosis not present

## 2019-03-14 DIAGNOSIS — C4362 Malignant melanoma of left upper limb, including shoulder: Secondary | ICD-10-CM | POA: Diagnosis not present

## 2019-03-14 DIAGNOSIS — D649 Anemia, unspecified: Secondary | ICD-10-CM | POA: Diagnosis not present

## 2019-03-14 DIAGNOSIS — Z89222 Acquired absence of left upper limb above elbow: Secondary | ICD-10-CM | POA: Diagnosis not present

## 2019-03-14 DIAGNOSIS — I10 Essential (primary) hypertension: Secondary | ICD-10-CM | POA: Diagnosis not present

## 2019-03-14 DIAGNOSIS — Z483 Aftercare following surgery for neoplasm: Secondary | ICD-10-CM | POA: Diagnosis not present

## 2019-03-15 DIAGNOSIS — L89152 Pressure ulcer of sacral region, stage 2: Secondary | ICD-10-CM | POA: Diagnosis not present

## 2019-03-15 DIAGNOSIS — D649 Anemia, unspecified: Secondary | ICD-10-CM | POA: Diagnosis not present

## 2019-03-15 DIAGNOSIS — I10 Essential (primary) hypertension: Secondary | ICD-10-CM | POA: Diagnosis not present

## 2019-03-15 DIAGNOSIS — C4362 Malignant melanoma of left upper limb, including shoulder: Secondary | ICD-10-CM | POA: Diagnosis not present

## 2019-03-15 DIAGNOSIS — Z483 Aftercare following surgery for neoplasm: Secondary | ICD-10-CM | POA: Diagnosis not present

## 2019-03-15 DIAGNOSIS — Z89222 Acquired absence of left upper limb above elbow: Secondary | ICD-10-CM | POA: Diagnosis not present

## 2019-03-16 DIAGNOSIS — D649 Anemia, unspecified: Secondary | ICD-10-CM | POA: Diagnosis not present

## 2019-03-16 DIAGNOSIS — L89152 Pressure ulcer of sacral region, stage 2: Secondary | ICD-10-CM | POA: Diagnosis not present

## 2019-03-16 DIAGNOSIS — C4362 Malignant melanoma of left upper limb, including shoulder: Secondary | ICD-10-CM | POA: Diagnosis not present

## 2019-03-16 DIAGNOSIS — Z89222 Acquired absence of left upper limb above elbow: Secondary | ICD-10-CM | POA: Diagnosis not present

## 2019-03-16 DIAGNOSIS — I10 Essential (primary) hypertension: Secondary | ICD-10-CM | POA: Diagnosis not present

## 2019-03-16 DIAGNOSIS — Z483 Aftercare following surgery for neoplasm: Secondary | ICD-10-CM | POA: Diagnosis not present

## 2019-03-17 DIAGNOSIS — C799 Secondary malignant neoplasm of unspecified site: Secondary | ICD-10-CM | POA: Diagnosis not present

## 2019-03-17 DIAGNOSIS — C439 Malignant melanoma of skin, unspecified: Secondary | ICD-10-CM | POA: Diagnosis not present

## 2019-03-17 DIAGNOSIS — Z681 Body mass index (BMI) 19 or less, adult: Secondary | ICD-10-CM | POA: Diagnosis not present

## 2019-03-17 DIAGNOSIS — M79629 Pain in unspecified upper arm: Secondary | ICD-10-CM | POA: Diagnosis not present

## 2019-03-17 DIAGNOSIS — M79602 Pain in left arm: Secondary | ICD-10-CM | POA: Diagnosis not present

## 2019-03-17 DIAGNOSIS — Z79891 Long term (current) use of opiate analgesic: Secondary | ICD-10-CM | POA: Diagnosis not present

## 2019-03-17 DIAGNOSIS — R531 Weakness: Secondary | ICD-10-CM | POA: Diagnosis not present

## 2019-03-17 DIAGNOSIS — D649 Anemia, unspecified: Secondary | ICD-10-CM | POA: Diagnosis not present

## 2019-03-18 DIAGNOSIS — L89152 Pressure ulcer of sacral region, stage 2: Secondary | ICD-10-CM | POA: Diagnosis not present

## 2019-03-18 DIAGNOSIS — Z89222 Acquired absence of left upper limb above elbow: Secondary | ICD-10-CM | POA: Diagnosis not present

## 2019-03-18 DIAGNOSIS — Z483 Aftercare following surgery for neoplasm: Secondary | ICD-10-CM | POA: Diagnosis not present

## 2019-03-18 DIAGNOSIS — D649 Anemia, unspecified: Secondary | ICD-10-CM | POA: Diagnosis not present

## 2019-03-18 DIAGNOSIS — I10 Essential (primary) hypertension: Secondary | ICD-10-CM | POA: Diagnosis not present

## 2019-03-18 DIAGNOSIS — C4362 Malignant melanoma of left upper limb, including shoulder: Secondary | ICD-10-CM | POA: Diagnosis not present

## 2019-03-21 DIAGNOSIS — L89152 Pressure ulcer of sacral region, stage 2: Secondary | ICD-10-CM | POA: Diagnosis not present

## 2019-03-21 DIAGNOSIS — D649 Anemia, unspecified: Secondary | ICD-10-CM | POA: Diagnosis not present

## 2019-03-21 DIAGNOSIS — Z89222 Acquired absence of left upper limb above elbow: Secondary | ICD-10-CM | POA: Diagnosis not present

## 2019-03-21 DIAGNOSIS — I10 Essential (primary) hypertension: Secondary | ICD-10-CM | POA: Diagnosis not present

## 2019-03-21 DIAGNOSIS — Z483 Aftercare following surgery for neoplasm: Secondary | ICD-10-CM | POA: Diagnosis not present

## 2019-03-21 DIAGNOSIS — C4362 Malignant melanoma of left upper limb, including shoulder: Secondary | ICD-10-CM | POA: Diagnosis not present

## 2019-03-22 DIAGNOSIS — M1990 Unspecified osteoarthritis, unspecified site: Secondary | ICD-10-CM | POA: Diagnosis not present

## 2019-03-22 DIAGNOSIS — C781 Secondary malignant neoplasm of mediastinum: Secondary | ICD-10-CM | POA: Diagnosis not present

## 2019-03-22 DIAGNOSIS — C7951 Secondary malignant neoplasm of bone: Secondary | ICD-10-CM | POA: Diagnosis not present

## 2019-03-22 DIAGNOSIS — C7989 Secondary malignant neoplasm of other specified sites: Secondary | ICD-10-CM | POA: Diagnosis not present

## 2019-03-22 DIAGNOSIS — C439 Malignant melanoma of skin, unspecified: Secondary | ICD-10-CM | POA: Diagnosis not present

## 2019-03-22 DIAGNOSIS — D649 Anemia, unspecified: Secondary | ICD-10-CM | POA: Diagnosis not present

## 2019-03-23 DIAGNOSIS — D649 Anemia, unspecified: Secondary | ICD-10-CM | POA: Diagnosis not present

## 2019-03-23 DIAGNOSIS — C7989 Secondary malignant neoplasm of other specified sites: Secondary | ICD-10-CM | POA: Diagnosis not present

## 2019-03-23 DIAGNOSIS — M1990 Unspecified osteoarthritis, unspecified site: Secondary | ICD-10-CM | POA: Diagnosis not present

## 2019-03-23 DIAGNOSIS — C439 Malignant melanoma of skin, unspecified: Secondary | ICD-10-CM | POA: Diagnosis not present

## 2019-03-23 DIAGNOSIS — C781 Secondary malignant neoplasm of mediastinum: Secondary | ICD-10-CM | POA: Diagnosis not present

## 2019-03-23 DIAGNOSIS — C7951 Secondary malignant neoplasm of bone: Secondary | ICD-10-CM | POA: Diagnosis not present

## 2019-03-25 DIAGNOSIS — M1990 Unspecified osteoarthritis, unspecified site: Secondary | ICD-10-CM | POA: Diagnosis not present

## 2019-03-25 DIAGNOSIS — C7951 Secondary malignant neoplasm of bone: Secondary | ICD-10-CM | POA: Diagnosis not present

## 2019-03-25 DIAGNOSIS — D649 Anemia, unspecified: Secondary | ICD-10-CM | POA: Diagnosis not present

## 2019-03-25 DIAGNOSIS — C439 Malignant melanoma of skin, unspecified: Secondary | ICD-10-CM | POA: Diagnosis not present

## 2019-03-25 DIAGNOSIS — C7989 Secondary malignant neoplasm of other specified sites: Secondary | ICD-10-CM | POA: Diagnosis not present

## 2019-03-25 DIAGNOSIS — C781 Secondary malignant neoplasm of mediastinum: Secondary | ICD-10-CM | POA: Diagnosis not present

## 2019-03-27 DIAGNOSIS — C439 Malignant melanoma of skin, unspecified: Secondary | ICD-10-CM | POA: Diagnosis not present

## 2019-03-27 DIAGNOSIS — M1990 Unspecified osteoarthritis, unspecified site: Secondary | ICD-10-CM | POA: Diagnosis not present

## 2019-03-27 DIAGNOSIS — C781 Secondary malignant neoplasm of mediastinum: Secondary | ICD-10-CM | POA: Diagnosis not present

## 2019-03-27 DIAGNOSIS — C7951 Secondary malignant neoplasm of bone: Secondary | ICD-10-CM | POA: Diagnosis not present

## 2019-03-27 DIAGNOSIS — D649 Anemia, unspecified: Secondary | ICD-10-CM | POA: Diagnosis not present

## 2019-03-27 DIAGNOSIS — C7989 Secondary malignant neoplasm of other specified sites: Secondary | ICD-10-CM | POA: Diagnosis not present

## 2019-03-28 DIAGNOSIS — M1990 Unspecified osteoarthritis, unspecified site: Secondary | ICD-10-CM | POA: Diagnosis not present

## 2019-03-28 DIAGNOSIS — C7951 Secondary malignant neoplasm of bone: Secondary | ICD-10-CM | POA: Diagnosis not present

## 2019-03-28 DIAGNOSIS — C7989 Secondary malignant neoplasm of other specified sites: Secondary | ICD-10-CM | POA: Diagnosis not present

## 2019-03-28 DIAGNOSIS — D649 Anemia, unspecified: Secondary | ICD-10-CM | POA: Diagnosis not present

## 2019-03-28 DIAGNOSIS — C439 Malignant melanoma of skin, unspecified: Secondary | ICD-10-CM | POA: Diagnosis not present

## 2019-03-28 DIAGNOSIS — C781 Secondary malignant neoplasm of mediastinum: Secondary | ICD-10-CM | POA: Diagnosis not present

## 2019-03-29 ENCOUNTER — Institutional Professional Consult (permissible substitution): Payer: Medicare Other | Admitting: Radiation Oncology

## 2019-03-29 DIAGNOSIS — C4359 Malignant melanoma of other part of trunk: Secondary | ICD-10-CM | POA: Diagnosis not present

## 2019-03-29 DIAGNOSIS — M1991 Primary osteoarthritis, unspecified site: Secondary | ICD-10-CM | POA: Diagnosis not present

## 2019-03-29 DIAGNOSIS — C439 Malignant melanoma of skin, unspecified: Secondary | ICD-10-CM | POA: Diagnosis not present

## 2019-03-29 DIAGNOSIS — C7951 Secondary malignant neoplasm of bone: Secondary | ICD-10-CM | POA: Diagnosis not present

## 2019-03-29 DIAGNOSIS — D500 Iron deficiency anemia secondary to blood loss (chronic): Secondary | ICD-10-CM | POA: Diagnosis not present

## 2019-03-29 DIAGNOSIS — C7989 Secondary malignant neoplasm of other specified sites: Secondary | ICD-10-CM | POA: Diagnosis not present

## 2019-03-29 DIAGNOSIS — D649 Anemia, unspecified: Secondary | ICD-10-CM | POA: Diagnosis not present

## 2019-03-29 DIAGNOSIS — C792 Secondary malignant neoplasm of skin: Secondary | ICD-10-CM | POA: Diagnosis not present

## 2019-03-29 DIAGNOSIS — M1990 Unspecified osteoarthritis, unspecified site: Secondary | ICD-10-CM | POA: Diagnosis not present

## 2019-03-29 DIAGNOSIS — C781 Secondary malignant neoplasm of mediastinum: Secondary | ICD-10-CM | POA: Diagnosis not present

## 2019-03-29 DIAGNOSIS — E038 Other specified hypothyroidism: Secondary | ICD-10-CM | POA: Diagnosis not present

## 2019-03-30 DIAGNOSIS — D500 Iron deficiency anemia secondary to blood loss (chronic): Secondary | ICD-10-CM | POA: Diagnosis not present

## 2019-03-30 DIAGNOSIS — C7989 Secondary malignant neoplasm of other specified sites: Secondary | ICD-10-CM | POA: Diagnosis not present

## 2019-03-30 DIAGNOSIS — C792 Secondary malignant neoplasm of skin: Secondary | ICD-10-CM | POA: Diagnosis not present

## 2019-03-30 DIAGNOSIS — C4359 Malignant melanoma of other part of trunk: Secondary | ICD-10-CM | POA: Diagnosis not present

## 2019-03-30 DIAGNOSIS — C781 Secondary malignant neoplasm of mediastinum: Secondary | ICD-10-CM | POA: Diagnosis not present

## 2019-03-30 DIAGNOSIS — C7951 Secondary malignant neoplasm of bone: Secondary | ICD-10-CM | POA: Diagnosis not present

## 2019-03-31 DIAGNOSIS — C781 Secondary malignant neoplasm of mediastinum: Secondary | ICD-10-CM | POA: Diagnosis not present

## 2019-03-31 DIAGNOSIS — D500 Iron deficiency anemia secondary to blood loss (chronic): Secondary | ICD-10-CM | POA: Diagnosis not present

## 2019-03-31 DIAGNOSIS — C792 Secondary malignant neoplasm of skin: Secondary | ICD-10-CM | POA: Diagnosis not present

## 2019-03-31 DIAGNOSIS — C4359 Malignant melanoma of other part of trunk: Secondary | ICD-10-CM | POA: Diagnosis not present

## 2019-03-31 DIAGNOSIS — C7989 Secondary malignant neoplasm of other specified sites: Secondary | ICD-10-CM | POA: Diagnosis not present

## 2019-03-31 DIAGNOSIS — C7951 Secondary malignant neoplasm of bone: Secondary | ICD-10-CM | POA: Diagnosis not present

## 2019-04-01 DIAGNOSIS — C7951 Secondary malignant neoplasm of bone: Secondary | ICD-10-CM | POA: Diagnosis not present

## 2019-04-01 DIAGNOSIS — C4359 Malignant melanoma of other part of trunk: Secondary | ICD-10-CM | POA: Diagnosis not present

## 2019-04-01 DIAGNOSIS — C7989 Secondary malignant neoplasm of other specified sites: Secondary | ICD-10-CM | POA: Diagnosis not present

## 2019-04-01 DIAGNOSIS — C781 Secondary malignant neoplasm of mediastinum: Secondary | ICD-10-CM | POA: Diagnosis not present

## 2019-04-01 DIAGNOSIS — C792 Secondary malignant neoplasm of skin: Secondary | ICD-10-CM | POA: Diagnosis not present

## 2019-04-01 DIAGNOSIS — D500 Iron deficiency anemia secondary to blood loss (chronic): Secondary | ICD-10-CM | POA: Diagnosis not present

## 2019-04-02 DIAGNOSIS — D500 Iron deficiency anemia secondary to blood loss (chronic): Secondary | ICD-10-CM | POA: Diagnosis not present

## 2019-04-02 DIAGNOSIS — C781 Secondary malignant neoplasm of mediastinum: Secondary | ICD-10-CM | POA: Diagnosis not present

## 2019-04-02 DIAGNOSIS — C4359 Malignant melanoma of other part of trunk: Secondary | ICD-10-CM | POA: Diagnosis not present

## 2019-04-02 DIAGNOSIS — C7989 Secondary malignant neoplasm of other specified sites: Secondary | ICD-10-CM | POA: Diagnosis not present

## 2019-04-02 DIAGNOSIS — C7951 Secondary malignant neoplasm of bone: Secondary | ICD-10-CM | POA: Diagnosis not present

## 2019-04-02 DIAGNOSIS — C792 Secondary malignant neoplasm of skin: Secondary | ICD-10-CM | POA: Diagnosis not present

## 2019-04-03 DIAGNOSIS — C7989 Secondary malignant neoplasm of other specified sites: Secondary | ICD-10-CM | POA: Diagnosis not present

## 2019-04-03 DIAGNOSIS — C4359 Malignant melanoma of other part of trunk: Secondary | ICD-10-CM | POA: Diagnosis not present

## 2019-04-03 DIAGNOSIS — D500 Iron deficiency anemia secondary to blood loss (chronic): Secondary | ICD-10-CM | POA: Diagnosis not present

## 2019-04-03 DIAGNOSIS — C781 Secondary malignant neoplasm of mediastinum: Secondary | ICD-10-CM | POA: Diagnosis not present

## 2019-04-03 DIAGNOSIS — C792 Secondary malignant neoplasm of skin: Secondary | ICD-10-CM | POA: Diagnosis not present

## 2019-04-03 DIAGNOSIS — C7951 Secondary malignant neoplasm of bone: Secondary | ICD-10-CM | POA: Diagnosis not present

## 2019-04-04 DIAGNOSIS — C7989 Secondary malignant neoplasm of other specified sites: Secondary | ICD-10-CM | POA: Diagnosis not present

## 2019-04-04 DIAGNOSIS — C4359 Malignant melanoma of other part of trunk: Secondary | ICD-10-CM | POA: Diagnosis not present

## 2019-04-04 DIAGNOSIS — D500 Iron deficiency anemia secondary to blood loss (chronic): Secondary | ICD-10-CM | POA: Diagnosis not present

## 2019-04-04 DIAGNOSIS — C792 Secondary malignant neoplasm of skin: Secondary | ICD-10-CM | POA: Diagnosis not present

## 2019-04-04 DIAGNOSIS — C781 Secondary malignant neoplasm of mediastinum: Secondary | ICD-10-CM | POA: Diagnosis not present

## 2019-04-04 DIAGNOSIS — C7951 Secondary malignant neoplasm of bone: Secondary | ICD-10-CM | POA: Diagnosis not present

## 2019-04-05 DIAGNOSIS — C792 Secondary malignant neoplasm of skin: Secondary | ICD-10-CM | POA: Diagnosis not present

## 2019-04-05 DIAGNOSIS — C781 Secondary malignant neoplasm of mediastinum: Secondary | ICD-10-CM | POA: Diagnosis not present

## 2019-04-05 DIAGNOSIS — C4359 Malignant melanoma of other part of trunk: Secondary | ICD-10-CM | POA: Diagnosis not present

## 2019-04-05 DIAGNOSIS — C7951 Secondary malignant neoplasm of bone: Secondary | ICD-10-CM | POA: Diagnosis not present

## 2019-04-05 DIAGNOSIS — D500 Iron deficiency anemia secondary to blood loss (chronic): Secondary | ICD-10-CM | POA: Diagnosis not present

## 2019-04-05 DIAGNOSIS — C7989 Secondary malignant neoplasm of other specified sites: Secondary | ICD-10-CM | POA: Diagnosis not present

## 2019-04-06 DIAGNOSIS — C4359 Malignant melanoma of other part of trunk: Secondary | ICD-10-CM | POA: Diagnosis not present

## 2019-04-06 DIAGNOSIS — D500 Iron deficiency anemia secondary to blood loss (chronic): Secondary | ICD-10-CM | POA: Diagnosis not present

## 2019-04-06 DIAGNOSIS — C792 Secondary malignant neoplasm of skin: Secondary | ICD-10-CM | POA: Diagnosis not present

## 2019-04-06 DIAGNOSIS — C781 Secondary malignant neoplasm of mediastinum: Secondary | ICD-10-CM | POA: Diagnosis not present

## 2019-04-06 DIAGNOSIS — C7951 Secondary malignant neoplasm of bone: Secondary | ICD-10-CM | POA: Diagnosis not present

## 2019-04-06 DIAGNOSIS — C7989 Secondary malignant neoplasm of other specified sites: Secondary | ICD-10-CM | POA: Diagnosis not present

## 2019-04-07 DIAGNOSIS — C7989 Secondary malignant neoplasm of other specified sites: Secondary | ICD-10-CM | POA: Diagnosis not present

## 2019-04-07 DIAGNOSIS — C781 Secondary malignant neoplasm of mediastinum: Secondary | ICD-10-CM | POA: Diagnosis not present

## 2019-04-07 DIAGNOSIS — C7951 Secondary malignant neoplasm of bone: Secondary | ICD-10-CM | POA: Diagnosis not present

## 2019-04-07 DIAGNOSIS — C792 Secondary malignant neoplasm of skin: Secondary | ICD-10-CM | POA: Diagnosis not present

## 2019-04-07 DIAGNOSIS — C4359 Malignant melanoma of other part of trunk: Secondary | ICD-10-CM | POA: Diagnosis not present

## 2019-04-07 DIAGNOSIS — D500 Iron deficiency anemia secondary to blood loss (chronic): Secondary | ICD-10-CM | POA: Diagnosis not present

## 2019-04-08 DIAGNOSIS — D500 Iron deficiency anemia secondary to blood loss (chronic): Secondary | ICD-10-CM | POA: Diagnosis not present

## 2019-04-08 DIAGNOSIS — C7989 Secondary malignant neoplasm of other specified sites: Secondary | ICD-10-CM | POA: Diagnosis not present

## 2019-04-08 DIAGNOSIS — C4359 Malignant melanoma of other part of trunk: Secondary | ICD-10-CM | POA: Diagnosis not present

## 2019-04-08 DIAGNOSIS — C7951 Secondary malignant neoplasm of bone: Secondary | ICD-10-CM | POA: Diagnosis not present

## 2019-04-08 DIAGNOSIS — C792 Secondary malignant neoplasm of skin: Secondary | ICD-10-CM | POA: Diagnosis not present

## 2019-04-08 DIAGNOSIS — C781 Secondary malignant neoplasm of mediastinum: Secondary | ICD-10-CM | POA: Diagnosis not present

## 2019-04-09 DIAGNOSIS — D500 Iron deficiency anemia secondary to blood loss (chronic): Secondary | ICD-10-CM | POA: Diagnosis not present

## 2019-04-09 DIAGNOSIS — C4359 Malignant melanoma of other part of trunk: Secondary | ICD-10-CM | POA: Diagnosis not present

## 2019-04-09 DIAGNOSIS — C792 Secondary malignant neoplasm of skin: Secondary | ICD-10-CM | POA: Diagnosis not present

## 2019-04-09 DIAGNOSIS — C7989 Secondary malignant neoplasm of other specified sites: Secondary | ICD-10-CM | POA: Diagnosis not present

## 2019-04-09 DIAGNOSIS — C781 Secondary malignant neoplasm of mediastinum: Secondary | ICD-10-CM | POA: Diagnosis not present

## 2019-04-09 DIAGNOSIS — C7951 Secondary malignant neoplasm of bone: Secondary | ICD-10-CM | POA: Diagnosis not present

## 2019-04-20 DEATH — deceased

## 2020-02-18 IMAGING — PT NM PET IMAGE RESTAGE (PS) WHOLE BODY
1 of 9 series · 3 of 25 positions shown · non-contrast
Comparison: PET-CT 07/19/2018

CLINICAL DATA: Subsequent treatment strategy for metastatic
melanoma.

EXAM:
NUCLEAR MEDICINE PET WHOLE BODY
TECHNIQUE: 7.82 mCi F-18 FDG was injected intravenously. Full-ring PET imaging
was performed from the skull base to thigh after the radiotracer. CT
data was obtained and used for attenuation correction and anatomic
localization.
Fasting blood glucose: 79 mg/dl

[Series 3: ct wb 5.0 b30f · axial · 5.0mm · 0.98mm/px · z∈[-1162,-258]mm · 3 of 603 slices shown]
[im 151/603  soft-tissue]
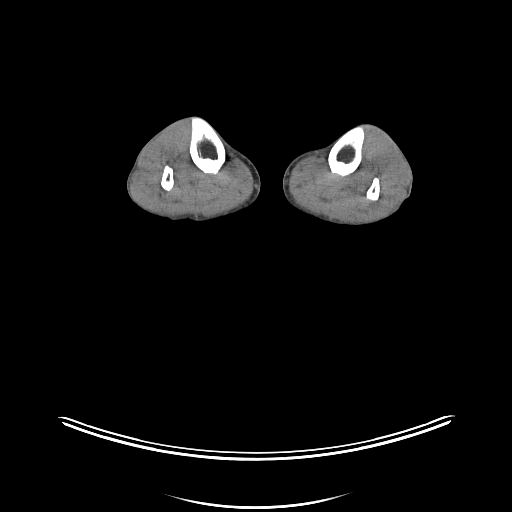
[im 302/603  soft-tissue]
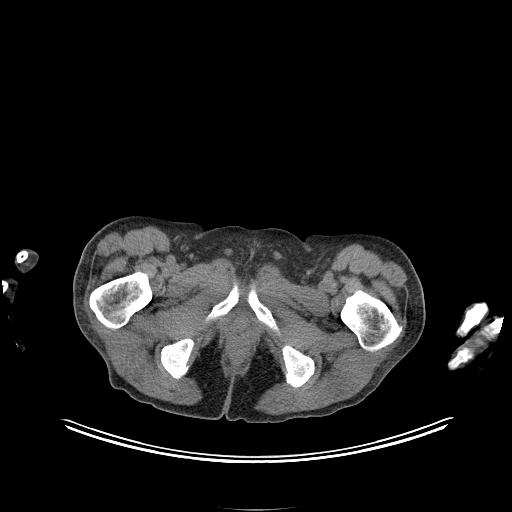
[im 452/603  soft-tissue]
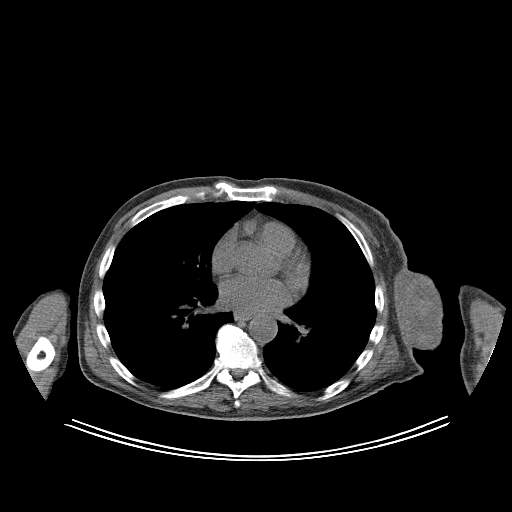

[3 of 25 positions shown; findings below may reference images not displayed]

FINDINGS: Mediastinal blood pool activity: SUV max

HEAD/NECK: No neck mass or neck adenopathy. There is hypermetabolism
in the left thyroid lobe with SUV max of 7.52. This was also present
on the prior study although SUV max was only 4.37. This is likely
due to thyroiditis.

Incidental CT findings: none

CHEST: Large persistent left axillary necrotic soft tissue mass
measuring approximately 12.2 x 5.3 cm. It previously measured 7.5 x
6.6 cm. It appears to be grossly eroding through the skin and is now
markedly exophytic in the lower axillary region. This portion
measures 8.0 x 4.6 cm. SUV max is 17.33 in the non necrotic areas.
There was previously 7.29.

Moderate activity noted between the scapula and the ribs without
discrete mass. This may be some type of inflammatory process.

Stable 8 mm right-sided subcarinal lymph node is hypermetabolic with
SUV max of 3.32. This was previously 4.58. Small right hilar node is
also mildly hypermetabolic with SUV max of 3.10. This was previously
3.28.

A few small scattered pulmonary nodules are stable.

On the prior PET-CT there was a focal skin lesion with
hypermetabolism involving the upper left posterior chest. This has
resolved.

Incidental CT findings: none

ABDOMEN/PELVIS: No abnormal hypermetabolic activity within the
liver, pancreas, adrenal glands, or spleen. No hypermetabolic lymph
nodes in the abdomen or pelvis.

Incidental CT findings: none

SKELETON: No focal hypermetabolic activity to suggest skeletal
metastasis.

Incidental CT findings: none

EXTREMITIES: No abnormal hypermetabolic activity in the lower
extremities.

Incidental CT findings: none
IMPRESSION: 1. Enlarging left axillary necrotic mass with a much bigger
exophytic portion in the lower axilla. SUV max has increased from
7.29 to 17.33.
2. Stable small right hilar and right subcarinal lymph nodes with
mild hypermetabolism.
3. A few small scattered pulmonary nodules are stable. No new or
progressive findings.
4. No abdominal/pelvic metastatic disease or osseous metastatic
disease.
